# Patient Record
Sex: Female | Born: 1945
Health system: Southern US, Community
[De-identification: ages and names within clinical notes are randomized; demographics above are authoritative.]

## PROBLEM LIST (undated history)

## (undated) DIAGNOSIS — E119 Type 2 diabetes mellitus without complications: Secondary | ICD-10-CM

## (undated) DIAGNOSIS — I10 Essential (primary) hypertension: Secondary | ICD-10-CM

## (undated) DIAGNOSIS — E785 Hyperlipidemia, unspecified: Secondary | ICD-10-CM

## (undated) HISTORY — PX: CHOLECYSTECTOMY: SHX55

---

## 2005-06-13 ENCOUNTER — Ambulatory Visit: Payer: Self-pay | Admitting: Internal Medicine

## 2009-04-01 ENCOUNTER — Ambulatory Visit: Payer: Self-pay | Admitting: Family Medicine

## 2009-04-01 DIAGNOSIS — L851 Acquired keratosis [keratoderma] palmaris et plantaris: Secondary | ICD-10-CM | POA: Insufficient documentation

## 2009-04-02 ENCOUNTER — Encounter: Payer: Self-pay | Admitting: Family Medicine

## 2011-10-25 ENCOUNTER — Ambulatory Visit: Payer: Self-pay | Admitting: Family Medicine

## 2012-09-03 DIAGNOSIS — G4733 Obstructive sleep apnea (adult) (pediatric): Secondary | ICD-10-CM | POA: Insufficient documentation

## 2012-12-29 LAB — HM COLONOSCOPY

## 2013-10-01 LAB — HM MAMMOGRAPHY

## 2014-05-26 LAB — LIPID PANEL
Cholesterol: 175 mg/dL (ref 0–200)
HDL: 40 mg/dL (ref 35–70)
LDL CALC: 79 mg/dL
Triglycerides: 281 mg/dL — AB (ref 40–160)

## 2014-05-26 LAB — MICROALBUMIN / CREATININE URINE RATIO: Microalb Creat Ratio: 16.2

## 2014-05-26 LAB — HEMOGLOBIN A1C: Hgb A1c MFr Bld: 7.5 % — AB (ref 4.0–6.0)

## 2014-09-01 LAB — CBC AND DIFFERENTIAL
Hemoglobin: 12.5 g/dL (ref 12.0–16.0)
Platelets: 281 10*3/uL (ref 150–399)
WBC: 6.5 10^3/mL

## 2014-09-01 LAB — BASIC METABOLIC PANEL
CREATININE: 0.6 mg/dL (ref 0.5–1.1)
Glucose: 135 mg/dL
POTASSIUM: 4.5 mmol/L (ref 3.4–5.3)
Sodium: 139 mmol/L (ref 137–147)

## 2014-09-01 LAB — CALCIUM: Calcium: 9.5 mg/dL

## 2014-09-01 LAB — LIPID PANEL
Cholesterol: 189 mg/dL (ref 0–200)
HDL: 44 mg/dL (ref 35–70)
LDL CALC: 77 mg/dL
Triglycerides: 341 mg/dL — AB (ref 40–160)

## 2014-09-01 LAB — TSH: TSH: 3.36 u[IU]/mL (ref 0.41–5.90)

## 2014-09-01 LAB — HEMOGLOBIN A1C: HEMOGLOBIN A1C: 7.5 % — AB (ref 4.0–6.0)

## 2014-09-01 LAB — HEPATIC FUNCTION PANEL
ALT: 17 U/L (ref 7–35)
AST: 16 U/L (ref 13–35)
Alkaline Phosphatase: 76 U/L (ref 25–125)

## 2014-09-01 LAB — MICROALBUMIN / CREATININE URINE RATIO: Microalb Creat Ratio: 3.36

## 2014-12-15 LAB — HEPATIC FUNCTION PANEL
ALK PHOS: 77 U/L (ref 25–125)
ALT: 19 U/L (ref 7–35)
AST: 17 U/L (ref 13–35)

## 2014-12-15 LAB — HEMOGLOBIN A1C: HEMOGLOBIN A1C: 7.7 % — AB (ref 4.0–6.0)

## 2014-12-15 LAB — BASIC METABOLIC PANEL: CREATININE: 0.5 mg/dL (ref 0.5–1.1)

## 2014-12-15 LAB — LIPID PANEL
CHOLESTEROL: 237 mg/dL — AB (ref 0–200)
HDL: 43 mg/dL (ref 35–70)
LDL CALC: 120 mg/dL
Triglycerides: 372 mg/dL — AB (ref 40–160)

## 2015-03-04 ENCOUNTER — Encounter: Payer: Self-pay | Admitting: *Deleted

## 2015-03-04 ENCOUNTER — Emergency Department
Admission: EM | Admit: 2015-03-04 | Discharge: 2015-03-04 | Disposition: A | Discharge status: Home / Self Care | Payer: Medicare HMO | Source: Home / Self Care | Attending: Emergency Medicine | Admitting: Emergency Medicine

## 2015-03-04 DIAGNOSIS — R2231 Localized swelling, mass and lump, right upper limb: Secondary | ICD-10-CM | POA: Diagnosis not present

## 2015-03-04 HISTORY — DX: Type 2 diabetes mellitus without complications: E11.9

## 2015-03-04 HISTORY — DX: Essential (primary) hypertension: I10

## 2015-03-04 HISTORY — DX: Hyperlipidemia, unspecified: E78.5

## 2015-03-04 NOTE — ED Provider Notes (Signed)
CSN: [id number]     Arrival date & time 03/04/15  1540 History   First MD Initiated Contact with Patient 03/04/15 1555     Chief Complaint  Patient presents with  . Mass   (Consider location/radiation/quality/duration/timing/severity/associated sxs/prior Treatment) HPI She is here to discuss a mass in the back of her right arm.  It is been there over a year.  Her primary care physician has ordered a ultrasound about a year ago and told her not to worry about it.  She thinks it has been getting bigger lately.  Because she is getting ready to go on a cruise and will be out of country, she was concerned.  She is also concerned with possible motion sickness and wonders if there's anything she can do for that.  No fever, chills, night sweats, weight loss.  No drainage from the mass.  It has been tender recently but she thinks she has been pressing on it more lately.  Past Medical History  Diagnosis Date  . Hypertension   . Diabetes mellitus without complication   . Hyperlipidemia    Past Surgical History  Procedure Laterality Date  . Cholecystectomy     Family History  Problem Relation Age of Onset  . Diabetes Father   . Cancer Father    History  Substance Use Topics  . Smoking status: Never Smoker   . Smokeless tobacco: Not on file  . Alcohol Use: No   OB History    No data available     Review of Systems  All other systems reviewed and are negative.   Allergies  Review of patient's allergies indicates no known allergies.  Home Medications   Prior to Admission medications   Medication Sig Start Date End Date Taking? Authorizing Provider  UNKNOWN TO PATIENT    Yes Historical Provider, MD  UNKNOWN TO PATIENT    Yes Historical Provider, MD  UNKNOWN TO PATIENT    Yes Historical Provider, MD   BP 119/67 mmHg  Pulse 70  Temp(Src) 98.5 F (36.9 C) (Oral)  Resp 18  Ht 5' 2.5" (1.588 m)  Wt 210 lb (95.255 kg)  BMI 37.77 kg/m2  SpO2 97% Physical Exam  Constitutional:  She is oriented to person, place, and time. She appears well-developed and well-nourished.  HENT:  Head: Normocephalic and atraumatic.  Eyes: No scleral icterus.  Neck: Neck supple.  Cardiovascular: Regular rhythm and normal heart sounds.   Pulmonary/Chest: Effort normal and breath sounds normal. No respiratory distress.  Neurological: She is alert and oriented to person, place, and time.  Skin: Skin is warm and dry.     She has a mass on the back of her upper arm as shown.  It is approximately 5 cm in diameter, soft, mobile, tender.  No erythema.  Psychiatric: She has a normal mood and affect. Her speech is normal.  Nursing note and vitals reviewed.   ED Course  Procedures (including critical care time) Labs Review Labs Reviewed - No data to display  Imaging Review No results found.   MDM   1. Arm mass, right    For her upcoming cruise, I check to see if her insurance would cover scopolamine patch and it did not.  So I have encouraged her to take Dramamine if necessary. For the mass on her arm, I feel that this is likely a sebaceous cyst versus a lipoma.  I do not see any signs of infection.  Can take over-the-counter anti-inflammatory medicines.  Would  like to repeat ultrasound and have her follow-up with Dr. Darene Lamer.  She states that she is also going to establish with Dr. Madilyn Fireman as her primary care physician.   Janeann Forehand, MD 03/04/15 [phone]

## 2015-03-04 NOTE — ED Notes (Signed)
Pt c/o lump on the back of her RT upper arm x 1 year that has become inflamed and painful recently. She reports having an U/S 1 year ago and was told it was benign.

## 2015-03-09 ENCOUNTER — Encounter: Payer: Medicare HMO | Admitting: Sports Medicine

## 2015-03-11 ENCOUNTER — Other Ambulatory Visit: Payer: Self-pay | Admitting: Emergency Medicine

## 2015-03-11 DIAGNOSIS — M7989 Other specified soft tissue disorders: Secondary | ICD-10-CM

## 2015-03-14 ENCOUNTER — Encounter: Payer: Self-pay | Admitting: Sports Medicine

## 2015-03-14 ENCOUNTER — Ambulatory Visit (INDEPENDENT_AMBULATORY_CARE_PROVIDER_SITE_OTHER): Payer: Medicare HMO | Admitting: Sports Medicine

## 2015-03-14 ENCOUNTER — Ambulatory Visit (INDEPENDENT_AMBULATORY_CARE_PROVIDER_SITE_OTHER): Payer: Medicare HMO

## 2015-03-14 VITALS — BP 153/75 | HR 66 | Ht 62.0 in | Wt 210.0 lb

## 2015-03-14 DIAGNOSIS — M7989 Other specified soft tissue disorders: Secondary | ICD-10-CM

## 2015-03-14 DIAGNOSIS — R2231 Localized swelling, mass and lump, right upper limb: Secondary | ICD-10-CM

## 2015-03-14 DIAGNOSIS — E119 Type 2 diabetes mellitus without complications: Secondary | ICD-10-CM | POA: Diagnosis not present

## 2015-03-14 DIAGNOSIS — M799 Soft tissue disorder, unspecified: Secondary | ICD-10-CM | POA: Diagnosis not present

## 2015-03-14 DIAGNOSIS — R2230 Localized swelling, mass and lump, unspecified upper limb: Secondary | ICD-10-CM | POA: Insufficient documentation

## 2015-03-14 MED ORDER — DIAZEPAM 5 MG PO TABS: ORAL_TABLET | ORAL | Status: DC

## 2015-03-14 NOTE — Assessment & Plan Note (Signed)
MRI with and without IV contrast. Considering the rapid growth, tenderness, even the ultrasound looks like a simple lipoma we are going to do an MRI for further evaluation that this may be a liposarcoma. Return for MRI results. Valium for preprocedural anxiolysis

## 2015-03-14 NOTE — Progress Notes (Signed)
   Subjective:    I'm seeing this patient as a consultation for:  Dr. Koleen Nimrod  CC: Right arm mass  HPI: This is a pleasant 69 year old female, for a long time she's had a mass in her right upper outer arm, an ultrasound prior showed what appeared to be a lipoma, more recently it's been enlarging, she was seen in urgent care where ultrasound showed again what appear to be a lipoma however much larger. It's tender, denies any constitutional symptoms. No trauma. Symptoms are moderate, worsening.  She is going to establish with Dr. Madilyn Fireman, she does have diabetes, we are getting blood work in anticipation of an IV contrast load, I'm happy to obtain blood work for her diabetes is well for Dr. Madilyn Fireman to follow up.  Past medical history, Surgical history, Family history not pertinant except as noted below, Social history, Allergies, and medications have been entered into the medical record, reviewed, and no changes needed.   Review of Systems: No headache, visual changes, nausea, vomiting, diarrhea, constipation, dizziness, abdominal pain, skin rash, fevers, chills, night sweats, weight loss, swollen lymph nodes, body aches, joint swelling, muscle aches, chest pain, shortness of breath, mood changes, visual or auditory hallucinations.   Objective:   General: Well Developed, well nourished, and in no acute distress.  Neuro/Psych: Alert and oriented x3, extra-ocular muscles intact, able to move all 4 extremities, sensation grossly intact. Skin: Warm and dry, no rashes noted.  Respiratory: Not using accessory muscles, speaking in full sentences, trachea midline.  Cardiovascular: Pulses palpable, no extremity edema. Abdomen: Does not appear distended. Right upper arm: There is a 5 cm moderately well-defined mass, tender, in the subcutaneous tissues. There is some overlying telangiectasias. No axillary adenopathy was palpable.  Impression and Recommendations:   This case required medical decision  making of moderate complexity.

## 2015-03-14 NOTE — Assessment & Plan Note (Signed)
Patient is going to establish with Dr. Madilyn Fireman, I am going to tee her up with blood work, they're also going to discuss weight loss.

## 2015-03-24 LAB — CBC
HCT: 37.4 % (ref 36.0–46.0)
Hemoglobin: 12.5 g/dL (ref 12.0–15.0)
MCH: 28.1 pg (ref 26.0–34.0)
MCHC: 33.4 g/dL (ref 30.0–36.0)
MCV: 84 fL (ref 78.0–100.0)
MPV: 10.5 fL (ref 8.6–12.4)
Platelets: 269 10*3/uL (ref 150–400)
RBC: 4.45 MIL/uL (ref 3.87–5.11)
RDW: 15.2 % (ref 11.5–15.5)
WBC: 7.1 K/uL (ref 4.0–10.5)

## 2015-03-25 LAB — URINALYSIS
Bilirubin Urine: NEGATIVE
Glucose, UA: NEGATIVE mg/dL
Hgb urine dipstick: NEGATIVE
Ketones, ur: NEGATIVE mg/dL
Leukocytes, UA: NEGATIVE
Nitrite: NEGATIVE
Protein, ur: NEGATIVE mg/dL
Specific Gravity, Urine: 1.026 (ref 1.005–1.030)
Urobilinogen, UA: 0.2 mg/dL (ref 0.0–1.0)
pH: 5 (ref 5.0–8.0)

## 2015-03-25 LAB — LIPID PANEL
Cholesterol: 172 mg/dL (ref 0–200)
HDL: 43 mg/dL — ABNORMAL LOW (ref >=46)
LDL Cholesterol: 80 mg/dL (ref 0–99)
Total CHOL/HDL Ratio: 4 Ratio
Triglycerides: 245 mg/dL — ABNORMAL HIGH (ref <150)
VLDL: 49 mg/dL — ABNORMAL HIGH (ref 0–40)

## 2015-03-25 LAB — MICROALBUMIN / CREATININE URINE RATIO
Creatinine, Urine: 218.3 mg/dL
Microalb Creat Ratio: 8.2 mg/g (ref 0.0–30.0)
Microalb, Ur: 1.8 mg/dL (ref <2.0)

## 2015-03-25 LAB — COMPREHENSIVE METABOLIC PANEL
Albumin: 3.8 g/dL (ref 3.5–5.2)
Alkaline Phosphatase: 61 U/L (ref 39–117)
BUN: 19 mg/dL (ref 6–23)
CO2: 24 mEq/L (ref 19–32)
Creat: 0.6 mg/dL (ref 0.50–1.10)
Glucose, Bld: 125 mg/dL — ABNORMAL HIGH (ref 70–99)
Total Bilirubin: 0.6 mg/dL (ref 0.2–1.2)

## 2015-03-25 LAB — TSH: TSH: 3.147 u[IU]/mL (ref 0.350–4.500)

## 2015-03-25 LAB — COMPREHENSIVE METABOLIC PANEL WITH GFR
ALT: 15 U/L (ref 0–35)
AST: 13 U/L (ref 0–37)
Calcium: 9.2 mg/dL (ref 8.4–10.5)
Chloride: 105 meq/L (ref 96–112)
Potassium: 4.2 meq/L (ref 3.5–5.3)
Sodium: 139 meq/L (ref 135–145)
Total Protein: 6.2 g/dL (ref 6.0–8.3)

## 2015-03-25 LAB — HEMOGLOBIN A1C
Hgb A1c MFr Bld: 6.9 % — ABNORMAL HIGH (ref <5.7)
Mean Plasma Glucose: 151 mg/dL — ABNORMAL HIGH (ref <117)

## 2015-03-25 LAB — VITAMIN D 25 HYDROXY (VIT D DEFICIENCY, FRACTURES): Vit D, 25-Hydroxy: 26 ng/mL — ABNORMAL LOW (ref 30–100)

## 2015-03-25 MED ORDER — VITAMIN D (ERGOCALCIFEROL) 1.25 MG (50000 UNIT) PO CAPS: 50000.0000 [IU] | ORAL_CAPSULE | ORAL | Status: DC

## 2015-03-25 NOTE — Addendum Note (Signed)
Addended by: Silverio Decamp on: 03/25/2015 01:15 PM   Modules accepted: Orders

## 2015-03-28 ENCOUNTER — Ambulatory Visit (INDEPENDENT_AMBULATORY_CARE_PROVIDER_SITE_OTHER): Payer: Medicare HMO | Admitting: Family Medicine

## 2015-03-28 ENCOUNTER — Encounter: Payer: Self-pay | Admitting: Family Medicine

## 2015-03-28 VITALS — BP 138/80 | HR 71 | Ht 62.5 in | Wt 210.0 lb

## 2015-03-28 DIAGNOSIS — R635 Abnormal weight gain: Secondary | ICD-10-CM | POA: Diagnosis not present

## 2015-03-28 DIAGNOSIS — E119 Type 2 diabetes mellitus without complications: Secondary | ICD-10-CM

## 2015-03-28 DIAGNOSIS — I10 Essential (primary) hypertension: Secondary | ICD-10-CM | POA: Diagnosis not present

## 2015-03-28 DIAGNOSIS — Z6838 Body mass index (BMI) 38.0-38.9, adult: Secondary | ICD-10-CM

## 2015-03-28 DIAGNOSIS — E785 Hyperlipidemia, unspecified: Secondary | ICD-10-CM | POA: Insufficient documentation

## 2015-03-28 DIAGNOSIS — G2581 Restless legs syndrome: Secondary | ICD-10-CM | POA: Insufficient documentation

## 2015-03-28 DIAGNOSIS — G4700 Insomnia, unspecified: Secondary | ICD-10-CM | POA: Insufficient documentation

## 2015-03-28 MED ORDER — CANAGLIFLOZIN 100 MG PO TABS: 100.0000 mg | ORAL_TABLET | Freq: Every day | ORAL | Status: DC

## 2015-03-28 NOTE — Progress Notes (Signed)
Subjective:    Patient ID: Jillian Huynh, female    DOB: [birth year], 69 y.o.   MRN: [id number]  HPI 69 year old female who is here today to establish care. Her husband's are at an established patient at our practice.  Diabetes - her last A1c was 6.9. no hypoglycemic events. No wounds or sores that are not healing well. No increased thirst or urination. Checking glucose at home. Taking medications as prescribed without any side effects. Has gained 6 lbs since started the Actos.  Hypertension- Pt denies chest pain, SOB, dizziness, or heart palpitations.  Taking meds as directed w/o problems.  Denies medication side effects.     Review of Systems  Constitutional: Negative for fever, diaphoresis and unexpected weight change.  HENT: Negative for hearing loss, rhinorrhea and tinnitus.   Eyes: Negative for visual disturbance.  Respiratory: Negative for cough and wheezing.   Cardiovascular: Negative for chest pain and palpitations.  Gastrointestinal: Negative for nausea, vomiting, diarrhea and blood in stool.  Genitourinary: Negative for vaginal bleeding, vaginal discharge and difficulty urinating.  Musculoskeletal: Negative for myalgias and arthralgias.  Skin: Negative for rash.  Neurological: Negative for headaches.  Hematological: Negative for adenopathy. Does not bruise/bleed easily.  Psychiatric/Behavioral: Negative for sleep disturbance and dysphoric mood. The patient is not nervous/anxious.      BP 138/80 mmHg  Pulse 71  Ht 5' 2.5" (1.588 m)  Wt 210 lb (95.255 kg)  BMI 37.77 kg/m2  SpO2 95%    Allergies  Allergen Reactions  . Metformin And Related Nausea Only, Diarrhea and Nausea And Vomiting    Past Medical History  Diagnosis Date  . Hypertension   . Diabetes mellitus without complication   . Hyperlipidemia     Past Surgical History  Procedure Laterality Date  . Cholecystectomy      History   Social History  . Marital Status: Married    Spouse Name: N/A  .  Number of Children: N/A  . Years of Education: N/A   Occupational History  . Not on file.   Social History Main Topics  . Smoking status: Never Smoker   . Smokeless tobacco: Not on file  . Alcohol Use: No  . Drug Use: No  . Sexual Activity: Not on file   Other Topics Concern  . Not on file   Social History Narrative    Family History  Problem Relation Age of Onset  . Diabetes Father   . Cancer Father     Outpatient Encounter Prescriptions as of 03/28/2015  Medication Sig  . ACCU-CHEK AVIVA PLUS test strip   . atorvastatin (LIPITOR) 10 MG tablet TAKE 1 TABLET (10 MG TOTAL) BY MOUTH DAILY.  Marland Kitchen Blood Glucose Monitoring Suppl DEVI Check blood glucose daily and prn dx 250.00  . Cholecalciferol (VITAMIN D3) 400 UNITS CAPS Take by mouth.  . Cyanocobalamin (VITAMIN B 12) 100 MCG LOZG Take 3 tablets by mouth.  . docusate calcium (SURFAK) 240 MG capsule Take 3 capsules by mouth.  . estradiol (ESTRACE) 0.5 MG tablet   . lisinopril (PRINIVIL,ZESTRIL) 10 MG tablet Take 10 mg by mouth.  . medroxyPROGESTERone (PROVERA) 2.5 MG tablet Take 2.5 mg by mouth.  . Multiple Vitamin (MULTIVITAMIN) capsule Take 1 capsule by mouth.  . Probiotic Product (ACIDOPHILUS/GOAT MILK) CAPS Take 1 tablet by mouth.  . Vitamin D, Ergocalciferol, (DRISDOL) 50000 UNITS CAPS capsule Take 1 capsule (50,000 Units total) by mouth every 7 (seven) days.  . [DISCONTINUED] diazepam (VALIUM) 5 MG tablet Take 1 tab  PO 1 hour before procedure or imaging.  . [DISCONTINUED] pioglitazone (ACTOS) 30 MG tablet Take 30 mg by mouth.  . canagliflozin (INVOKANA) 100 MG TABS tablet Take 1 tablet (100 mg total) by mouth daily.   No facility-administered encounter medications on file as of 03/28/2015.          Objective:   Physical Exam  Constitutional: She is oriented to person, place, and time. She appears well-developed and well-nourished.  HENT:  Head: Normocephalic and atraumatic.  Cardiovascular: Normal rate, regular  rhythm and normal heart sounds.   Pulmonary/Chest: Effort normal and breath sounds normal.  Neurological: She is alert and oriented to person, place, and time.  Skin: Skin is warm and dry.  Psychiatric: She has a normal mood and affect. Her behavior is normal.          Assessment & Plan:  DM-  On statin and ACE. A1c up to 6.9.  She reports that this is better than it was. She really wants to change the actos since has noticed sig weight gain on the medication. Discussed options. Will try changing to [facility]. Given Rx and coupon card for 30 day free trial. Covered on insurance plan.  F/U in 3 mo.    HTN - repeat BP at goal. F/U in 3 mo.   Abnormal weigh gain/BMI 38 - Discussed using My Fitness Pal and regular exercise.  Will change med as well. F/U in 3 mo or sooner if really struggling with weight loss.

## 2015-03-28 NOTE — Patient Instructions (Signed)
Work on exercise for 30 min 5 days per week.  Smart Phone free appl - My Fitness Pal

## 2015-03-29 ENCOUNTER — Encounter: Payer: Self-pay | Admitting: Family Medicine

## 2015-04-02 ENCOUNTER — Ambulatory Visit (HOSPITAL_BASED_OUTPATIENT_CLINIC_OR_DEPARTMENT_OTHER)
Admission: RE | Admit: 2015-04-02 | Discharge: 2015-04-02 | Disposition: A | Discharge status: Home / Self Care | Payer: Medicare HMO | Source: Ambulatory Visit | Attending: Sports Medicine | Admitting: Sports Medicine

## 2015-04-02 DIAGNOSIS — R2231 Localized swelling, mass and lump, right upper limb: Secondary | ICD-10-CM | POA: Insufficient documentation

## 2015-04-02 MED ORDER — GADOBENATE DIMEGLUMINE 529 MG/ML IV SOLN
19.0000 mL | Freq: Once | INTRAVENOUS | Status: AC | PRN
  Administered 2015-04-02: 19 mL via INTRAVENOUS

## 2015-04-06 ENCOUNTER — Encounter: Payer: Self-pay | Admitting: Family Medicine

## 2015-04-14 ENCOUNTER — Encounter: Payer: Self-pay | Admitting: Family Medicine

## 2015-06-01 ENCOUNTER — Telehealth: Payer: Self-pay | Admitting: *Deleted

## 2015-06-01 MED ORDER — FLUCONAZOLE 150 MG PO TABS: 150.0000 mg | ORAL_TABLET | Freq: Once | ORAL | Status: DC

## 2015-06-01 MED ORDER — DAPAGLIFLOZIN PROPANEDIOL 10 MG PO TABS: 10.0000 mg | ORAL_TABLET | Freq: Every day | ORAL | Status: DC

## 2015-06-01 NOTE — Telephone Encounter (Signed)
Pt stated that the sample med that she was given caused her to get a yeast infection. She took this for 30 days. After she finished that she went back to the "old" diabetic pioglitazone and she feels bloated and has gained weight and feels miserable. She doesn't want to take something that will cause her to have a yeast infection. She has a f/u appt on 06/29/15. She would like to know what should be done. I advised her that if Dr. Madilyn Fireman were to send something into her pharmacy she would not send it to her mail order due to not knowing if this would be a good fit for her and that if there were to be any changes it would be easier to just send it to her local pharmacy. Pt voiced understanding. Will fwd to pcp for f/u.Audelia Hives [place]

## 2015-06-01 NOTE — Telephone Encounter (Signed)
Let try [place]. Still has risk of yeast infection but I think she will tolerate it better. Will also send over diflucan so just in case gets another yeat infection can take pill and knock it out quickly.

## 2015-06-01 NOTE — Telephone Encounter (Signed)
Pt called and lvm stating that she was seen about 1.5 months ago and was given a new DM med. This medication caused her to have a yeast infection that lasted for about 10 days. She finished the medication and went back to her old medication. She said that she has gained about 6 lbs and has been bloated. Jillian Huynh, [name]

## 2015-06-01 NOTE — Telephone Encounter (Signed)
Called and lvm informing pt of med change to [place] and Diflucan for yeast. Advised to rtn call if any questions.Jillian Huynh [place]

## 2015-06-03 MED ORDER — EMPAGLIFLOZIN 10 MG PO TABS: 10.0000 mg | ORAL_TABLET | Freq: Every day | ORAL | Status: DC

## 2015-06-03 NOTE — Addendum Note (Signed)
Addended by: Beatrice Lecher D on: 06/03/2015 12:49 PM   Modules accepted: Orders

## 2015-06-03 NOTE — Telephone Encounter (Addendum)
Called pt to inform her that her insurance will not cover [place] and since she had an intolerance to Invokana we will send the Jardiance to [facility]. Pt voiced understanding. Placed order for med and pended for approval.Senay Sistrunk, [name]'

## 2015-06-03 NOTE — Addendum Note (Signed)
Addended by: Teddy Spike on: 06/03/2015 12:31 PM   Modules accepted: Orders, Medications

## 2015-06-07 ENCOUNTER — Telehealth: Payer: Self-pay | Admitting: Family Medicine

## 2015-06-07 NOTE — Telephone Encounter (Signed)
Received fax for prior authorization on Farxiga sent through cover my meds waiting on authorization. - CF

## 2015-06-08 NOTE — Telephone Encounter (Signed)
Received fax from [facility] and they have approved Farxiga 10mg  tablets until 11/12/2015. - CF

## 2015-06-10 ENCOUNTER — Encounter: Payer: Self-pay | Admitting: *Deleted

## 2015-06-29 ENCOUNTER — Encounter: Payer: Self-pay | Admitting: Family Medicine

## 2015-06-29 ENCOUNTER — Ambulatory Visit (INDEPENDENT_AMBULATORY_CARE_PROVIDER_SITE_OTHER): Payer: Medicare HMO | Admitting: Family Medicine

## 2015-06-29 VITALS — BP 140/78 | HR 73 | Ht 62.5 in | Wt 210.0 lb

## 2015-06-29 DIAGNOSIS — E119 Type 2 diabetes mellitus without complications: Secondary | ICD-10-CM | POA: Diagnosis not present

## 2015-06-29 DIAGNOSIS — R635 Abnormal weight gain: Secondary | ICD-10-CM

## 2015-06-29 DIAGNOSIS — N760 Acute vaginitis: Secondary | ICD-10-CM | POA: Diagnosis not present

## 2015-06-29 DIAGNOSIS — Z23 Encounter for immunization: Secondary | ICD-10-CM

## 2015-06-29 LAB — POCT GLYCOSYLATED HEMOGLOBIN (HGB A1C): Hemoglobin A1C: 6.6

## 2015-06-29 MED ORDER — DAPAGLIFLOZIN PROPANEDIOL 10 MG PO TABS: 10.0000 mg | ORAL_TABLET | Freq: Every day | ORAL | Status: DC

## 2015-06-29 NOTE — Patient Instructions (Signed)
Recommend use My Fitness Pal.

## 2015-06-29 NOTE — Progress Notes (Signed)
Subjective:    Patient ID: Jillian Huynh, female    DOB: 11-12-1946, 69 y.o.   MRN: [id number]  HPI Diabetes - no hypoglycemic events. No wounds or sores that are not healing well. No increased thirst or urination. Checking glucose at home. Taking medications as prescribed without any side effects. Wilder Glade was just approved about 2 weeks ago.   She said she picked up one of the medications and has been taking it but it's difficult to say if it's the Jardiance or infarct C go. The first cecal was initially denied but then a week later was approved by her insurance and in the interim we have sent a prescription for Jardiance.  Abnormal weight gain-  At previous office visit 3 months ago we had discussed using my fitness pal to help her count calories and working on regular exercise in addition to switching her Actos to a new her diabetic medication that does not tend to cause weight gain.  She is having vaginal itching. No pain with urination.  Had this before when she was on the [place]. Didn't treat and it got better after 3 weeks.  2nd time occurred she took the Diflucan we had called in for her and got better. She i sfeeling better now.  She did not actually come in for a testosterone appointment for this.   Review of Systems     Objective:   Physical Exam  Constitutional: She is oriented to person, place, and time. She appears well-developed and well-nourished.  HENT:  Head: Normocephalic and atraumatic.  Cardiovascular: Normal rate, regular rhythm and normal heart sounds.   Pulmonary/Chest: Effort normal and breath sounds normal.  Neurological: She is alert and oriented to person, place, and time.  Skin: Skin is warm and dry.  Psychiatric: She has a normal mood and affect. Her behavior is normal.          Assessment & Plan:  DM-  I suspect she is probably taking the falx he doesn't fit was approved by her insurance. They did ask her to double check when she got home and let us  know if it's different than what we think she is taking. She would like a 90 day prescription sent to her mail order pharmacy.   Hemoglobin A1c is 6.6 today which is improved from last time.  Follow-up in 3 months. Continue work on diet and exercise.  Abnormal weight gain/BMI 37 - discussed My Fitness pal.  She said she lost the paper that I gave her last time but says that she will plan to do so. I think will help her track calories. Discussed the importance of eating small amounts route daily with plenty of vegetables and making sure she's getting adequate protein. 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 She said she used to walk daily years ago but after her daughter passed away she quit exercising. Encouraged her to walk at least on her days off from work. Even starting with 15 or 20 minutes on those days as a great start. Just encouraged her to take baby steps and trying to get herself healthy again.   vaginitis-her symptoms are resolved now so we did not do a wet prep. Certainly could be secondary to the new medication. Encouraged her to make sure that she's hydrating well and to call us and come in if she gets recurrent symptoms so  that we can test with a wet prep to verify if she has a yeast infection or  Urinary tract infection.

## 2015-07-21 ENCOUNTER — Telehealth: Payer: Self-pay | Admitting: *Deleted

## 2015-07-21 NOTE — Telephone Encounter (Signed)
Pt reports that the pharmacist told her to try Glipizide. Will fwd to pcp .Audelia Hives [place]

## 2015-07-21 NOTE — Telephone Encounter (Signed)
Pt called and stating that the medication that Dr. Madilyn Fireman started her on is to expensive and she will need an alternative. I called her back and asked that she return the call to let us know what were the generics that she was given to take instead. [name], [name]

## 2015-07-22 MED ORDER — GLIPIZIDE 5 MG PO TABS: 5.0000 mg | ORAL_TABLET | Freq: Two times a day (BID) | ORAL | Status: DC

## 2015-07-22 NOTE — Telephone Encounter (Signed)
Ok will send over to [employer].

## 2015-09-28 ENCOUNTER — Ambulatory Visit (INDEPENDENT_AMBULATORY_CARE_PROVIDER_SITE_OTHER): Payer: Medicare HMO | Admitting: Family Medicine

## 2015-09-28 ENCOUNTER — Encounter: Payer: Self-pay | Admitting: Family Medicine

## 2015-09-28 VITALS — BP 153/61 | HR 75 | Ht 63.0 in | Wt 212.4 lb

## 2015-09-28 DIAGNOSIS — Z79890 Hormone replacement therapy (postmenopausal): Secondary | ICD-10-CM

## 2015-09-28 DIAGNOSIS — K5901 Slow transit constipation: Secondary | ICD-10-CM | POA: Diagnosis not present

## 2015-09-28 DIAGNOSIS — E119 Type 2 diabetes mellitus without complications: Secondary | ICD-10-CM

## 2015-09-28 DIAGNOSIS — R600 Localized edema: Secondary | ICD-10-CM

## 2015-09-28 DIAGNOSIS — R609 Edema, unspecified: Secondary | ICD-10-CM

## 2015-09-28 DIAGNOSIS — R635 Abnormal weight gain: Secondary | ICD-10-CM | POA: Diagnosis not present

## 2015-09-28 DIAGNOSIS — Z6837 Body mass index (BMI) 37.0-37.9, adult: Secondary | ICD-10-CM

## 2015-09-28 LAB — COMPLETE METABOLIC PANEL WITH GFR
ALT: 28 U/L (ref 6–29)
AST: 23 U/L (ref 10–35)
Albumin: 4.3 g/dL (ref 3.6–5.1)
Alkaline Phosphatase: 87 U/L (ref 33–130)
BILIRUBIN TOTAL: 0.7 mg/dL (ref 0.2–1.2)
BUN: 12 mg/dL (ref 7–25)
CHLORIDE: 104 mmol/L (ref 98–110)
CO2: 25 mmol/L (ref 20–31)
CREATININE: 0.56 mg/dL (ref 0.50–0.99)
Calcium: 9.9 mg/dL (ref 8.6–10.4)
GFR, Est Non African American: >89 mL/min (ref >=60)
GLUCOSE: 113 mg/dL — AB (ref 65–99)
Potassium: 4.3 mmol/L (ref 3.5–5.3)
SODIUM: 139 mmol/L (ref 135–146)
TOTAL PROTEIN: 6.8 g/dL (ref 6.1–8.1)

## 2015-09-28 LAB — POCT GLYCOSYLATED HEMOGLOBIN (HGB A1C): HEMOGLOBIN A1C: 6.5

## 2015-09-28 LAB — TSH: TSH: 2.672 u[IU]/mL (ref 0.350–4.500)

## 2015-09-28 MED ORDER — LISINOPRIL-HYDROCHLOROTHIAZIDE 10-12.5 MG PO TABS: 1.0000 | ORAL_TABLET | Freq: Every day | ORAL | Status: DC

## 2015-09-28 NOTE — Progress Notes (Signed)
Subjective:    Patient ID: Jillian Huynh, female    DOB: 09/17/1946, 69 y.o.   MRN: [id number]  HPI Diabetes - no hypoglycemic events. No wounds or sores that are not healing well. No increased thirst or urination. Checking glucose at home. Taking medications as prescribed without any side effects.  abnormal weight gain - she is very active and she has been trying to exercise.  She is also on glipizide.  She is just ver frustrated because she is gaining weight. She says she is still retaining a lot of fluid.  I had ntice the last time she was here that her feet and hands look swollen. She says she really tries to avoid any excess salt at all. Stays away from soft drinks etc. She said her biggest weaknesses sugary fods. She said a lot of people bring and snacks and treats to work and it's just extremely tempting.  She has come off her hormones. She knows she neede to get off of them so has stopped them. She has started  Having hot flashes again but says she is dealing with it.  Constipation - she is taking Plexia and says it has helped her bowels. She has been taking a probiotic with it as well.  She is now having daily BMs.    Lab Results  Component Value Date   TSH 3.147 03/24/2015    Review of Systems     Objective:   Physical Exam  Constitutional: She is oriented to person, place, and time. She appears well-developed and well-nourished.  HENT:  Head: Normocephalic and atraumatic.  Right Ear: External ear normal.  Left Ear: External ear normal.  Nose: Nose normal.  Mouth/Throat: Oropharynx is clear and moist.  TMs and canals are clear.   Eyes: Conjunctivae and EOM are normal. Pupils are equal, round, and reactive to light.  Neck: Neck supple. No thyromegaly present.  Cardiovascular: Normal rate, regular rhythm and normal heart sounds.   Pulmonary/Chest: Effort normal and breath sounds normal. She has no wheezes.  Lymphadenopathy:    She has no cervical adenopathy.   Neurological: She is alert and oriented to person, place, and time.  Skin: Skin is warm and dry.  Psychiatric: She has a normal mood and affect.        Assessment & Plan:  DM - well controlled. Hemoglobin A1c 6.5 today which looks fantastic. She's back on the glipizide. She reports that her eye exam is up-to-date. We'll call to get report.  Abnormal weight gain - will refer to nutritionist.  I do understand that she is frustrated. I discussed with her that I really want her to get in contact with her insurance company to see what medications are covered with her diabetes. It sounds like she is Re: Had her Medicare gap's we may not be able to use them util January but at least we would have an idea of what is covered. I really think theglipizie could be contributing to her recent weight gain.   Extremity edema-recommend a trial of low-dose hydrochlorothiazide which we can add to her diuretic which will also in turn elp control her blood pressure little bit better. Follow-up in 3 months. We can adjust her dose at that time if needed. Again continue to watch salt intake which it sounds like she's doing well with.   Hormonal placement therapy-she i now of of her estrogen and progesterone. Remove from hr meication list.    cconstippation-much improved on her current probiotic regimen.  Goes to [facility].  Will call to get report.   [facility].  Will call to get report. Marland Kitchen

## 2015-12-28 ENCOUNTER — Ambulatory Visit (INDEPENDENT_AMBULATORY_CARE_PROVIDER_SITE_OTHER): Payer: Medicare HMO | Admitting: Family Medicine

## 2015-12-28 ENCOUNTER — Encounter: Payer: Self-pay | Admitting: Family Medicine

## 2015-12-28 VITALS — BP 140/60 | HR 69 | Ht 63.0 in | Wt 214.0 lb

## 2015-12-28 DIAGNOSIS — Z6837 Body mass index (BMI) 37.0-37.9, adult: Secondary | ICD-10-CM

## 2015-12-28 DIAGNOSIS — E119 Type 2 diabetes mellitus without complications: Secondary | ICD-10-CM | POA: Diagnosis not present

## 2015-12-28 DIAGNOSIS — E785 Hyperlipidemia, unspecified: Secondary | ICD-10-CM | POA: Diagnosis not present

## 2015-12-28 DIAGNOSIS — I10 Essential (primary) hypertension: Secondary | ICD-10-CM | POA: Diagnosis not present

## 2015-12-28 DIAGNOSIS — R600 Localized edema: Secondary | ICD-10-CM

## 2015-12-28 DIAGNOSIS — E669 Obesity, unspecified: Secondary | ICD-10-CM

## 2015-12-28 LAB — POCT GLYCOSYLATED HEMOGLOBIN (HGB A1C): HEMOGLOBIN A1C: 7.2

## 2015-12-28 MED ORDER — METFORMIN HCL ER 500 MG PO TB24: 500.0000 mg | ORAL_TABLET | Freq: Every day | ORAL | Status: DC

## 2015-12-28 MED ORDER — LOSARTAN POTASSIUM-HCTZ 50-12.5 MG PO TABS: 1.0000 | ORAL_TABLET | Freq: Every day | ORAL | Status: DC

## 2015-12-28 NOTE — Progress Notes (Signed)
   Subjective:    Patient ID: Jillian Huynh, female    DOB: [birth year], 70 y.o.   MRN: [id number]  HPI Diabetes - no hypoglycemic events. No wounds or sores that are not healing well. No increased thirst or urination. Checking glucose at home. Taking medications as prescribed without any side effects.  LE edema - we added hctz to her regimen at last OV.  Ok Edwards says it has really helped her swelling and hre BP is down.   Hypertension- Pt denies chest pain, SOB, dizziness, or heart palpitations.  Taking meds as directed w/o problems.  She has had a dry cough is been going on for months. She feels like the cough is mostly coming from her throat. She's been in fact she barely noticed it herself but her son who came to visit her head mentioned it to her couple of times that she was coughing frequently. She denies any shortness of breath wheezing or upper respiratory symptoms. No fevers chills or sweats.   She got a fit Bit and has been more intune to it.  She says she really didn't want to see a nutritionist. Unfortunately she has still gained a couple pounds since I last saw her in that she's been trying to exercise more.   Review of Systems     Objective:   Physical Exam  Constitutional: She is oriented to person, place, and time. She appears well-developed and well-nourished.  HENT:  Head: Normocephalic and atraumatic.  Cardiovascular: Normal rate, regular rhythm and normal heart sounds.   Pulmonary/Chest: Effort normal and breath sounds normal.  Neurological: She is alert and oriented to person, place, and time.  Skin: Skin is warm and dry.  Psychiatric: She has a normal mood and affect. Her behavior is normal.          Assessment & Plan:  DM- a1c of 7.2.  She plans on going back for eye exam at [facility].  She is willing to try metformin again.  I discussed to help manage her weight and would really like to get her off of the glipizide. Will start with extended release  metformin which is less likely to cause diarrhea and GI upset. New perception sent to the pharmacy. Okay to go ahead and decrease glipizide down to 1 tab until then. I'll see her back in 3 months.  HTN - lisinopril is likely causing dry cough. Will change to losartan.  Addition of the HCTZ has been great.  Explained that the cough may take a couple weeks to completely resolve but if it is still present after 2-3 weeks and please let me know and we can workup further with chest x-ray etc.  Dry cough - likely medication side effect.  Will chane to losartan.   LE edema - inmproved on diuretiec.  Will continue. In addition the blood pressure is down by about 11 points today which is fantastic. We'll continue to work on diet and exercise to better control blood pressure and swelling.  Abnormal weight gain/obesity/BMI 37-we'll try switching to metformin and hopefully weaning her off of glipizide. I think this would make a big difference in addition to her using her fit bit to help her increase her exercise and activity level. Again continue to work on healthy diet and healthy food choices.

## 2016-03-02 ENCOUNTER — Other Ambulatory Visit: Payer: Self-pay | Admitting: Family Medicine

## 2016-03-08 ENCOUNTER — Encounter: Payer: Self-pay | Admitting: Family Medicine

## 2016-03-13 LAB — HM DIABETES EYE EXAM

## 2016-03-16 ENCOUNTER — Encounter: Payer: Self-pay | Admitting: Family Medicine

## 2016-03-16 ENCOUNTER — Ambulatory Visit (INDEPENDENT_AMBULATORY_CARE_PROVIDER_SITE_OTHER): Payer: Medicare HMO | Admitting: Family Medicine

## 2016-03-16 VITALS — BP 128/65 | HR 72 | Wt 200.0 lb

## 2016-03-16 DIAGNOSIS — Z6835 Body mass index (BMI) 35.0-35.9, adult: Secondary | ICD-10-CM

## 2016-03-16 DIAGNOSIS — E119 Type 2 diabetes mellitus without complications: Secondary | ICD-10-CM | POA: Diagnosis not present

## 2016-03-16 DIAGNOSIS — G2581 Restless legs syndrome: Secondary | ICD-10-CM

## 2016-03-16 LAB — POCT GLYCOSYLATED HEMOGLOBIN (HGB A1C): HEMOGLOBIN A1C: 7

## 2016-03-16 MED ORDER — ATORVASTATIN CALCIUM 10 MG PO TABS: 10.0000 mg | ORAL_TABLET | Freq: Every day | ORAL | Status: DC

## 2016-03-16 MED ORDER — ROPINIROLE HCL 3 MG PO TABS: 3.0000 mg | ORAL_TABLET | Freq: Every day | ORAL | Status: DC

## 2016-03-16 MED ORDER — METFORMIN HCL ER 500 MG PO TB24: 1000.0000 mg | ORAL_TABLET | Freq: Every day | ORAL | Status: DC

## 2016-03-16 NOTE — Progress Notes (Signed)
Subjective:    CC:   HPI: Diabetes - no hypoglycemic events. No wounds or sores that are not healing well. No increased thirst or urination. Checking glucose at home. Taking medications as prescribed without any side effects.   Obesity - He is going over to the wellness center and has recently started phentermine and has lost 14 pounds so far.  Restless leg syndrome-she's currently on Requip 3 mg daily. We have not refilled this for her previously but she is due for refill.  Past medical history, Surgical history, Family history not pertinant except as noted below, Social history, Allergies, and medications have been entered into the medical record, reviewed, and no changes needed.   Review of Systems: No fevers, chills, night sweats, weight loss, chest pain, or shortness of breath.   Objective:    General: Well Developed, well nourished, and in no acute distress.  Neuro: Alert and oriented x3, extra-ocular muscles intact, sensation grossly intact.  HEENT: Normocephalic, atraumatic  Skin: Warm and dry, no rashes. Cardiac: Regular rate and rhythm, no murmurs rubs or gallops, no lower extremity edema.  Respiratory: Clear to auscultation bilaterally. Not using accessory muscles, speaking in full sentences.   Impression and Recommendations:   DM- almost at goal.  She is doing well with the extender release metformin so far. She didn't tolerate the regular release because of GI upset. So we discussed increasing the metformin to 2 tabs daily and see if she does okay with this over the next 3 months. Certainly if she doesn't tolerate it I encouraged her to call the office back and let me know. Still eventually like to get her off the glipizide of possible in the future because of the weight gain issue. She did Artie tried [place] and didn't do well with it.F/U in 3 mo.  Due for labs.   Lab Results  Component Value Date   HGBA1C 7.0 03/16/2016    Obesity/BMI 35-she's actually done fantastic  with phentermine and is Artie lost 14 pounds. She's followed at the wellness Center. She doesn't quite have a regular exercise routine and that she is starting to do some daily step counting. Just rolling encouraged her to make sure that she gets on track with this because at some point she will have to try to maintain her weight when she comes off the phentermine.  Restless leg syndrome-well-controlled on her quit. New perception sent to mail order.

## 2016-03-26 ENCOUNTER — Ambulatory Visit: Payer: Medicare HMO | Admitting: Family Medicine

## 2016-06-07 ENCOUNTER — Other Ambulatory Visit: Payer: Self-pay | Admitting: Family Medicine

## 2016-06-14 ENCOUNTER — Other Ambulatory Visit: Payer: Self-pay | Admitting: Family Medicine

## 2016-06-20 ENCOUNTER — Encounter: Payer: Self-pay | Admitting: Family Medicine

## 2016-06-20 ENCOUNTER — Ambulatory Visit (INDEPENDENT_AMBULATORY_CARE_PROVIDER_SITE_OTHER): Payer: Medicare HMO | Admitting: Family Medicine

## 2016-06-20 VITALS — BP 128/78 | HR 80 | Resp 15 | Wt 195.0 lb

## 2016-06-20 DIAGNOSIS — I10 Essential (primary) hypertension: Secondary | ICD-10-CM

## 2016-06-20 DIAGNOSIS — E119 Type 2 diabetes mellitus without complications: Secondary | ICD-10-CM | POA: Diagnosis not present

## 2016-06-20 DIAGNOSIS — D1720 Benign lipomatous neoplasm of skin and subcutaneous tissue of unspecified limb: Secondary | ICD-10-CM | POA: Diagnosis not present

## 2016-06-20 LAB — POCT GLYCOSYLATED HEMOGLOBIN (HGB A1C): Hemoglobin A1C: 7

## 2016-06-20 NOTE — Progress Notes (Signed)
Subjective:    CC: DM  HPI: Diabetes - no hypoglycemic events. No wounds or sores that are not healing well. No increased thirst or urination. Checking glucose at home. Taking medications as prescribed without any side effects.  Hypertension- Pt denies chest pain, SOB, dizziness, or heart palpitations.  Taking meds as directed w/o problems.  Denies medication side effects.    She did have her eye exam with Dr. Chapman Moss here in [place] in [place]. They did do some further examination for glaucoma and she will now. She has a very strong family history in 2 sisters, her mother and her maternal aunt.  She also has a lipoma on her right upper arm. It was confirmed with diagnosis with Dr. Dianah Field. He had offered surgery but she went discuss it with me first to see what I thought about it.   BP 128/78 (BP Location: Right Arm, Cuff Size: Normal)   Pulse 80   Resp 15   Wt 195 lb (88.5 kg)   SpO2 99%   BMI 34.54 kg/m     Allergies  Allergen Reactions  . Metformin And Related Nausea Only, Diarrhea and Nausea And Vomiting  . Invokana [Canagliflozin] Other (See Comments)    Yeast infection    Past Medical History:  Diagnosis Date  . Diabetes mellitus without complication ([place])   . Hyperlipidemia   . Hypertension     Past Surgical History:  Procedure Laterality Date  . CHOLECYSTECTOMY      Social History   Social History  . Marital status: Married    Spouse name: Biochemist, clinical  . Number of children: 3  . Years of education: college   Occupational History  . Hair Stylist    Social History Main Topics  . Smoking status: Never Smoker  . Smokeless tobacco: Not on file  . Alcohol use No  . Drug use: No  . Sexual activity: Yes    Partners: Male   Other Topics Concern  . Not on file   Social History Narrative   No exercise.      Family History  Problem Relation Age of Onset  . Diabetes Father   . Cancer Father   . Hypertension    . Thyroid disease     . Colon cancer Daughter   . Glaucoma Mother   . Glaucoma Sister   . Glaucoma Sister   . Glaucoma Maternal Aunt     Outpatient Encounter Prescriptions as of 06/20/2016  Medication Sig  . ACCU-CHEK AVIVA PLUS test strip   . atorvastatin (LIPITOR) 10 MG tablet Take 1 tablet (10 mg total) by mouth daily at 6 PM. TAKE 1 TABLET (10 MG TOTAL) BY MOUTH DAILY.  Marland Kitchen Cholecalciferol (VITAMIN D3) 400 UNITS CAPS Take by mouth.  . losartan-hydrochlorothiazide (HYZAAR) 50-12.5 MG tablet Take 1 tablet by mouth daily.  . metFORMIN (GLUCOPHAGE-XR) 500 MG 24 hr tablet TAKE 2 TABLETS (1,000 MG TOTAL) BY MOUTH DAILY WITH BREAKFAST.  Marland Kitchen phentermine (ADIPEX-P) 37.5 MG tablet   . Probiotic Product (PROBIOTIC FORMULA PO) Take by mouth.  Marland Kitchen rOPINIRole (REQUIP) 3 MG tablet Take 1 tablet (3 mg total) by mouth daily.  . [DISCONTINUED] glipiZIDE (GLUCOTROL) 5 MG tablet Take 1 tablet (5 mg total) by mouth 2 (two) times daily before a meal.  . [DISCONTINUED] Multiple Vitamin (MULTIVITAMIN) capsule Take 1 capsule by mouth.   No facility-administered encounter medications on file as of 06/20/2016.        Review of Systems: No fevers,  chills, night sweats, weight loss, chest pain, or shortness of breath.   Objective:    General: Well Developed, well nourished, and in no acute distress.  Neuro: Alert and oriented x3, extra-ocular muscles intact, sensation grossly intact.  HEENT: Normocephalic, atraumatic  Skin: Warm and dry, no rashes.She does have a very large lipoma on her right upper arm above the elbow. Cardiac: Regular rate and rhythm, no murmurs rubs or gallops, no lower extremity edema.  Respiratory: Clear to auscultation bilaterally. Not using accessory muscles, speaking in full sentences.   Impression and Recommendations:   DM- On ARB.  A1c is stable at 7.0. She really feels like she is doing better though. She's been trying to eat more healthy and is actually lost most 5 pounds. We discussed options of  continuing current regimen and giving her 3 more months to try to get her A1c under 7 MRSA starting Victoza or Trulicity. She opted to work on diet and exercise first. Lab Results  Component Value Date   HGBA1C 7.0 06/20/2016   Hypertension-well-controlled. Continue current regimen. Follow-up in 3 months.  Lipoma, right upper arm-discussed surgical options. Certainly it is a benign condition so she doesn't have to have it removed but if she feels like it's actually getting larger and becoming more problematic than I would recommend it. I did explain to her that cosmetically it would have a divot and a significant scar and I want her to be aware of that. Recovery time would probably be about a week to 10 days. Certainly we can refer her at her convenience.

## 2016-07-08 ENCOUNTER — Emergency Department
Admission: EM | Admit: 2016-07-08 | Discharge: 2016-07-08 | Disposition: A | Discharge status: Home / Self Care | Payer: Medicare HMO | Source: Home / Self Care | Attending: Family Medicine | Admitting: Family Medicine

## 2016-07-08 ENCOUNTER — Encounter: Payer: Self-pay | Admitting: Emergency Medicine

## 2016-07-08 DIAGNOSIS — H66012 Acute suppurative otitis media with spontaneous rupture of ear drum, left ear: Secondary | ICD-10-CM | POA: Diagnosis not present

## 2016-07-08 MED ORDER — AMOXICILLIN-POT CLAVULANATE 875-125 MG PO TABS: 1.0000 | ORAL_TABLET | Freq: Two times a day (BID) | ORAL | 0 refills | Status: DC

## 2016-07-08 MED ORDER — TRAMADOL HCL 50 MG PO TABS
50.0000 mg | ORAL_TABLET | Freq: Four times a day (QID) | ORAL | 0 refills | Status: DC | PRN
Start: 2016-07-08 — End: 2016-07-19

## 2016-07-08 NOTE — Discharge Instructions (Signed)
°  Tramadol is strong pain medication. While taking, do not drink alcohol, drive, or perform any other activities that requires focus while taking these medications.   You may take 500mg  acetaminophen (Tylenol) every 4-6 hours, and 400-600mg  ibuprofen (Motrin or Advil) every 6-8 hours as needed for pain.

## 2016-07-08 NOTE — ED Provider Notes (Signed)
CSN: [id number]     Arrival date & time 07/08/16  1303 History   First MD Initiated Contact with Patient 07/08/16 1329     Chief Complaint  Patient presents with  . Otalgia   (Consider location/radiation/quality/duration/timing/severity/associated sxs/prior Treatment) HPI  Piney Lachman is a 70 y.o. female presenting to UC with c/o severe worsening Left ear pain that started about 10 days ago.  Pt was out of town when symptoms started so she went to a [place]. She was advised her ear canal was so swollen at the time, the provider could not see her ear drum. She was prescribed antibiotic ear drops for 7 days, today was the last day but pt notes pain is worse than ever before. Associated decreased hearing in Left ear since onset of symptoms over 1 week ago.  She has taken acetaminophen and ibuprofen w/o improvement. Denies fever, chills, n/v/d. Denies cough, congestion or sore throat. Pt notes she has never had this type of ear pain.    Past Medical History:  Diagnosis Date  . Diabetes mellitus without complication ([place])   . Hyperlipidemia   . Hypertension    Past Surgical History:  Procedure Laterality Date  . CHOLECYSTECTOMY     Family History  Problem Relation Age of Onset  . Diabetes Father   . Cancer Father   . Hypertension    . Thyroid disease    . Colon cancer Daughter   . Glaucoma Mother   . Glaucoma Sister   . Glaucoma Sister   . Glaucoma Maternal Aunt    Social History  Substance Use Topics  . Smoking status: Never Smoker  . Smokeless tobacco: Never Used  . Alcohol use No   OB History    No data available     Review of Systems  Constitutional: Negative for chills and fever.  HENT: Positive for ear pain (Left) and hearing loss (Left). Negative for congestion, ear discharge, facial swelling, sinus pressure and sore throat.   Respiratory: Negative for cough and wheezing.   Cardiovascular: Negative for chest pain.  Gastrointestinal: Negative for diarrhea, nausea  and vomiting.  Neurological: Negative for dizziness, light-headedness and headaches.    Allergies  Metformin and related and Invokana [canagliflozin]  Home Medications   Prior to Admission medications   Medication Sig Start Date End Date Taking? Authorizing Provider  ACCU-CHEK AVIVA PLUS test strip  12/16/14   Historical Provider, MD  amoxicillin-clavulanate (AUGMENTIN) 875-125 MG tablet Take 1 tablet by mouth 2 (two) times daily. One po bid x 7 days 07/08/16   Noland Fordyce, PA-C  atorvastatin (LIPITOR) 10 MG tablet Take 1 tablet (10 mg total) by mouth daily at 6 PM. TAKE 1 TABLET (10 MG TOTAL) BY MOUTH DAILY. 03/16/16   [name], MD  Cholecalciferol (VITAMIN D3) 400 UNITS CAPS Take by mouth.    Historical Provider, MD  losartan-hydrochlorothiazide (HYZAAR) 50-12.5 MG tablet Take 1 tablet by mouth daily. 12/28/15   [name], MD  metFORMIN (GLUCOPHAGE-XR) 500 MG 24 hr tablet TAKE 2 TABLETS (1,000 MG TOTAL) BY MOUTH DAILY WITH BREAKFAST. 06/14/16   [name], MD  phentermine (ADIPEX-P) 37.5 MG tablet  01/12/16   Historical Provider, MD  Probiotic Product (PROBIOTIC FORMULA PO) Take by mouth.    Historical Provider, MD  rOPINIRole (REQUIP) 3 MG tablet Take 1 tablet (3 mg total) by mouth daily. 03/16/16   [name], MD  traMADol (ULTRAM) 50 MG tablet Take 1 tablet (50 mg total)  by mouth every 6 (six) hours as needed. 07/08/16   Noland Fordyce, PA-C   Meds Ordered and Administered this Visit  Medications - No data to display  BP 139/77 (BP Location: Left Arm)   Pulse 73   Temp 97.9 F (36.6 C) (Oral)   Resp 16   Ht 5\' 2"  (1.575 m)   Wt 195 lb (88.5 kg)   SpO2 98%   BMI 35.67 kg/m  No data found.   Physical Exam  Constitutional: She is oriented to person, place, and time. She appears well-developed and well-nourished. She appears distressed.  Pt moving around in exam chair, appears uncomfortable, occasionally rubbing her Left ear.  HENT:  Head:  Normocephalic and atraumatic.  Right Ear: Tympanic membrane normal.  Left Ear: There is drainage ( yellow discharge filling ear canal. no blood). No mastoid tenderness.  Left ear: unable to visualize TM due to yellow discharge in ear canal. No bleeding.   Eyes: EOM are normal.  Neck: Normal range of motion. Neck supple.  Cardiovascular: Normal rate and regular rhythm.   Pulmonary/Chest: Effort normal and breath sounds normal. No stridor. No respiratory distress. She has no wheezes. She has no rales.  Musculoskeletal: Normal range of motion.  Lymphadenopathy:    She has no cervical adenopathy.  Neurological: She is alert and oriented to person, place, and time.  Skin: Skin is warm and dry. She is not diaphoretic.  Psychiatric: She has a normal mood and affect. Her behavior is normal.  Nursing note and vitals reviewed.   Urgent Care Course   Clinical Course    Procedures (including critical care time)  Labs Review Labs Reviewed - No data to display  Imaging Review No results found.   MDM   1. Acute suppurative otitis media of left ear with spontaneous rupture of tympanic membrane, recurrence not specified    Pt c/o severe Left ear pain despite completing a treatment for otitis externa in same ear. Yellow drainage in ear canal. Pt appears very uncomfortable.  Likely TM rupture due to AOM as pt is in severe pain, has discharge and reports decreased hearing. Rx: Augmentin and tramadol (advised pt tramadol can cause drowsiness, take as directed, do not drive while taking) May alternate acetaminophen and ibuprofen Encouraged f/u with PCP later this week if not improving, especially if worsening.  Patient verbalized understanding and agreement with treatment plan.      Noland Fordyce, PA-C 07/08/16 1417

## 2016-07-08 NOTE — ED Triage Notes (Signed)
Patient was traveling and had left ear pain over a week ago; went to an urgent care and was prescribed ear gtts. This has not cleared her ear pain. Took acetaminophen 1 hour ago.

## 2016-07-19 ENCOUNTER — Ambulatory Visit (INDEPENDENT_AMBULATORY_CARE_PROVIDER_SITE_OTHER): Payer: Medicare HMO | Admitting: Family Medicine

## 2016-07-19 ENCOUNTER — Encounter: Payer: Self-pay | Admitting: Family Medicine

## 2016-07-19 VITALS — BP 149/71 | HR 73 | Temp 97.9°F | Ht 63.0 in | Wt 194.0 lb

## 2016-07-19 DIAGNOSIS — B3731 Acute candidiasis of vulva and vagina: Secondary | ICD-10-CM

## 2016-07-19 DIAGNOSIS — B373 Candidiasis of vulva and vagina: Secondary | ICD-10-CM | POA: Diagnosis not present

## 2016-07-19 DIAGNOSIS — H6092 Unspecified otitis externa, left ear: Secondary | ICD-10-CM

## 2016-07-19 MED ORDER — FLUCONAZOLE 150 MG PO TABS: 150.0000 mg | ORAL_TABLET | Freq: Once | ORAL | 1 refills | Status: AC

## 2016-07-19 NOTE — Progress Notes (Addendum)
Patient ID: Jillian Huynh, female   DOB: 1946/03/12, 70 y.o.   MRN: [id number]  HPI 70 year-old caucasian female with diabetes presenting today with complaints of left-sided ear pain. Patient states that it began 3 weeks ago as "popping" in her ear. The patient notes that last Tuesday (07/10/16) she was in severe pain that she rated 10/10 with 10 being the worst pain she has ever felt. She states that on that Tuesday she felt like she had a fever with nausea and chills. She notes that the pain is a 0/10 today but she had black discharge when wiping her ear. The patient states that she completed a course of Augmentin, ear drops, and pain medication. She notes that although the pain improved she is still experiencing left-sided ear discomfort and decreased hearing. The patient states that she has been taking 4 tablets of Ibuprofen over the counter with minimal pain relief. Additionally, the patient states that she believes she has a vaginal yeast infection that began after taking her antibiotics. Patient denies recent air travel, trauma to the affected ear, current ear pain, or nausea.  Review of Systems  Constitutional: Negative for chills and fever.  HENT: Positive for congestion, ear pain, hearing loss and rhinorrhea. Negative for ear discharge.   Gastrointestinal: Negative for nausea.  Genitourinary: Positive for vaginal discharge.  Allergic/Immunologic: Negative for environmental allergies.   BP (!) 149/71 (BP Location: Left Arm, Patient Position: Sitting, Cuff Size: Normal)   Pulse 73   Temp 97.9 F (36.6 C)   Ht 5\' 3"  (1.6 m)   Wt 194 lb (88 kg)   SpO2 100%   BMI 34.37 kg/m     Allergies  Allergen Reactions  . Metformin And Related Nausea Only, Diarrhea and Nausea And Vomiting  . Invokana [Canagliflozin] Other (See Comments)    Yeast infection    Past Medical History:  Diagnosis Date  . Diabetes mellitus without complication ([place])   . Hyperlipidemia   . Hypertension     Past  Surgical History:  Procedure Laterality Date  . CHOLECYSTECTOMY      Social History   Social History  . Marital status: Married    Spouse name: Jillian Huynh, clinical  . Number of children: 3  . Years of education: college   Occupational History  . Hair Stylist    Social History Main Topics  . Smoking status: Never Smoker  . Smokeless tobacco: Never Used  . Alcohol use No  . Drug use: No  . Sexual activity: Yes    Partners: Male   Other Topics Concern  . Not on file   Social History Narrative   No exercise.      Family History  Problem Relation Age of Onset  . Diabetes Father   . Cancer Father   . Hypertension    . Thyroid disease    . Colon cancer Daughter   . Glaucoma Mother   . Glaucoma Sister   . Glaucoma Sister   . Glaucoma Maternal Aunt     Outpatient Encounter Prescriptions as of 07/19/2016  Medication Sig  . ACCU-CHEK AVIVA PLUS test strip   . atorvastatin (LIPITOR) 10 MG tablet Take 1 tablet (10 mg total) by mouth daily at 6 PM. TAKE 1 TABLET (10 MG TOTAL) BY MOUTH DAILY.  Marland Kitchen Cholecalciferol (VITAMIN D3) 400 UNITS CAPS Take by mouth.  . losartan-hydrochlorothiazide (HYZAAR) 50-12.5 MG tablet Take 1 tablet by mouth daily.  . metFORMIN (GLUCOPHAGE-XR) 500 MG 24 hr tablet TAKE 2  TABLETS (1,000 MG TOTAL) BY MOUTH DAILY WITH BREAKFAST.  Marland Kitchen phentermine (ADIPEX-P) 37.5 MG tablet   . Probiotic Product (PROBIOTIC FORMULA PO) Take by mouth.  Marland Kitchen rOPINIRole (REQUIP) 3 MG tablet Take 1 tablet (3 mg total) by mouth daily.  . [DISCONTINUED] traMADol (ULTRAM) 50 MG tablet Take 1 tablet (50 mg total) by mouth every 6 (six) hours as needed.  . fluconazole (DIFLUCAN) 150 MG tablet Take 1 tablet (150 mg total) by mouth once.  . [DISCONTINUED] amoxicillin-clavulanate (AUGMENTIN) 875-125 MG tablet Take 1 tablet by mouth 2 (two) times daily. One po bid x 7 days   No facility-administered encounter medications on file as of 07/19/2016.          Objective:   Physical Exam    Constitutional: She appears well-developed and well-nourished. No distress.  HENT:  Head: Normocephalic and atraumatic.  Right Ear: External ear and ear canal normal. No lacerations. No swelling or tenderness. No foreign bodies. No middle ear effusion.  Left Ear: There is tenderness. No foreign bodies. Decreased hearing is noted.  Ears:  Skin: She is not diaphoretic.       Assessment:    [name] was seen today for ear pain.  Diagnoses and all orders for this visit:  Otitis externa, left -     Ambulatory referral to ENT  Yeast infection of the vagina  Other orders -     fluconazole (DIFLUCAN) 150 MG tablet; Take 1 tablet (150 mg total) by mouth once.       Plan:     1. Left-sided otitis externa- Patient presented with 3 week history of left-sided earache that failed treatment with augmentin and ear drops. Suspect that patients ear pathology is secondary to a fungal infection due to the patients history of diabetes. Patient given ambulatory referral to ENT today for debridement and further evaluation. Will continue to monitor.   2. Yeast infection of the vagina - Suspect patient developed a yeast infection secondary to her diabetes and recent course of antibiotics. Patient to start on fluconazole 150mg  tablets once daily. Patient to return to clinic if symptoms do not improve or worsen.

## 2016-08-06 ENCOUNTER — Other Ambulatory Visit: Payer: Self-pay | Admitting: Family Medicine

## 2016-08-06 DIAGNOSIS — Z139 Encounter for screening, unspecified: Secondary | ICD-10-CM

## 2016-08-22 ENCOUNTER — Ambulatory Visit (INDEPENDENT_AMBULATORY_CARE_PROVIDER_SITE_OTHER): Payer: Medicare HMO

## 2016-08-22 DIAGNOSIS — Z1231 Encounter for screening mammogram for malignant neoplasm of breast: Secondary | ICD-10-CM | POA: Diagnosis not present

## 2016-08-22 DIAGNOSIS — Z139 Encounter for screening, unspecified: Secondary | ICD-10-CM

## 2016-09-19 ENCOUNTER — Ambulatory Visit (INDEPENDENT_AMBULATORY_CARE_PROVIDER_SITE_OTHER): Payer: Medicare HMO | Admitting: Family Medicine

## 2016-09-19 ENCOUNTER — Encounter: Payer: Self-pay | Admitting: Family Medicine

## 2016-09-19 VITALS — BP 137/72 | HR 69 | Wt 191.0 lb

## 2016-09-19 DIAGNOSIS — E119 Type 2 diabetes mellitus without complications: Secondary | ICD-10-CM | POA: Diagnosis not present

## 2016-09-19 DIAGNOSIS — Z1159 Encounter for screening for other viral diseases: Secondary | ICD-10-CM | POA: Diagnosis not present

## 2016-09-19 DIAGNOSIS — I10 Essential (primary) hypertension: Secondary | ICD-10-CM

## 2016-09-19 LAB — LIPID PANEL
CHOL/HDL RATIO: 4.1 ratio (ref <5.0)
CHOLESTEROL: 164 mg/dL (ref <200)
HDL: 40 mg/dL — AB (ref >50)
LDL CALC: 67 mg/dL
TRIGLYCERIDES: 287 mg/dL — AB (ref <150)
VLDL: 57 mg/dL — AB (ref <30)

## 2016-09-19 LAB — COMPLETE METABOLIC PANEL WITH GFR
ALT: 17 U/L (ref 6–29)
AST: 15 U/L (ref 10–35)
Albumin: 4.3 g/dL (ref 3.6–5.1)
Alkaline Phosphatase: 78 U/L (ref 33–130)
BUN: 17 mg/dL (ref 7–25)
CHLORIDE: 101 mmol/L (ref 98–110)
CO2: 25 mmol/L (ref 20–31)
CREATININE: 0.55 mg/dL (ref 0.50–0.99)
Calcium: 9.7 mg/dL (ref 8.6–10.4)
Glucose, Bld: 133 mg/dL — ABNORMAL HIGH (ref 65–99)
POTASSIUM: 4.5 mmol/L (ref 3.5–5.3)
Sodium: 137 mmol/L (ref 135–146)
Total Bilirubin: 0.7 mg/dL (ref 0.2–1.2)
Total Protein: 6.5 g/dL (ref 6.1–8.1)

## 2016-09-19 LAB — POCT GLYCOSYLATED HEMOGLOBIN (HGB A1C): HEMOGLOBIN A1C: 7

## 2016-09-19 NOTE — Progress Notes (Signed)
Subjective:    CC: DM  HPI:   Diabetes - no hypoglycemic events. No wounds or sores that are not healing well. No increased thirst or urination. Checking glucose at home. Taking medications as prescribed without any side effects.  Hypertension- Pt denies chest pain, SOB, dizziness, or heart palpitations.  Taking meds as directed w/o problems.  Denies medication side effects.     Past medical history, Surgical history, Family history not pertinant except as noted below, Social history, Allergies, and medications have been entered into the medical record, reviewed, and corrections made.   Review of Systems: No fevers, chills, night sweats, weight loss, chest pain, or shortness of breath.   Objective:    General: Well Developed, well nourished, and in no acute distress.  Neuro: Alert and oriented x3, extra-ocular muscles intact, sensation grossly intact.  HEENT: Normocephalic, atraumatic  Skin: Warm and dry, no rashes. Cardiac: Regular rate and rhythm, no murmurs rubs or gallops, no lower extremity edema.  Respiratory: Clear to auscultation bilaterally. Not using accessory muscles, speaking in full sentences.   Impression and Recommendations:   DM - Stable. Discussed goal to get it underneath 7.0.  We discussed adding a second medication. She really wants to work on diet and exercise. She admits she still eating a fair amount of sweets and feels like she has a lot of room for improvement. Follow-up in 3 months. Continue Lipitor.   Lab Results  Component Value Date   HGBA1C 7.0 09/19/2016    HTN - Well controlled. Continue current regimen. Follow up in  3 months.

## 2016-09-20 LAB — HEPATITIS C ANTIBODY: HCV Ab: NEGATIVE

## 2016-11-19 ENCOUNTER — Ambulatory Visit (INDEPENDENT_AMBULATORY_CARE_PROVIDER_SITE_OTHER): Payer: Medicare HMO | Admitting: Physician Assistant

## 2016-11-19 ENCOUNTER — Encounter: Payer: Self-pay | Admitting: Physician Assistant

## 2016-11-19 VITALS — BP 147/73 | HR 80 | Temp 97.6°F | Ht 63.0 in | Wt 189.0 lb

## 2016-11-19 DIAGNOSIS — J4521 Mild intermittent asthma with (acute) exacerbation: Secondary | ICD-10-CM

## 2016-11-19 MED ORDER — IPRATROPIUM-ALBUTEROL 0.5-2.5 (3) MG/3ML IN SOLN
3.0000 mL | Freq: Once | RESPIRATORY_TRACT | Status: AC
  Administered 2016-11-19: 3 mL via RESPIRATORY_TRACT

## 2016-11-19 MED ORDER — AZITHROMYCIN 250 MG PO TABS: ORAL_TABLET | ORAL | 0 refills | Status: DC

## 2016-11-19 MED ORDER — PREDNISONE 50 MG PO TABS: ORAL_TABLET | ORAL | 0 refills | Status: DC

## 2016-11-19 MED ORDER — ALBUTEROL SULFATE HFA 108 (90 BASE) MCG/ACT IN AERS: 2.0000 | INHALATION_SPRAY | Freq: Four times a day (QID) | RESPIRATORY_TRACT | 1 refills | Status: DC | PRN

## 2016-11-19 NOTE — Progress Notes (Addendum)
   Subjective:    Patient ID: Jillian Huynh, female    DOB: [birth year], 71 y.o.   MRN: [id number]  HPI Patient is a 71yo female who presents to the clinic with a dry cough for 1 week.  Patient states her cough is dry, but she is unable to cough up mucus and it feels like "a brick is in her chest."  Patient reports she started with a sore throat, which has since resolved.  Patient reports she is still experiencing sinus congestion, PND, fatigue, chills, sweating,and body aches.  Patient denies any nausea, vomiting, or diarrhea.  Patient states she has tried OTC products and has used Vick's with minimal to no relief.  Patient denies receiving a flu shot and denies any sick contacts.      Review of Systems  Constitutional: Positive for chills and fatigue.  HENT: Positive for congestion, postnasal drip and sinus pressure.   Respiratory: Positive for cough and chest tightness.   All other systems reviewed and are negative.      Objective:   Physical Exam  Constitutional: She appears well-developed and well-nourished.  HENT:  Head: Normocephalic and atraumatic.  Right Ear: External ear normal.  Left Ear: External ear normal.  Nose: Nose normal.  Mouth/Throat: Oropharynx is clear and moist.  Eyes: Conjunctivae are normal.  Neck: Normal range of motion. Neck supple.  Cardiovascular: Normal rate, regular rhythm and normal heart sounds.   Pulmonary/Chest:  Right lower lobe: crackles heard.    Lymphadenopathy:    She has no cervical adenopathy.  Psychiatric: She has a normal mood and affect. Her behavior is normal.  Nursing note and vitals reviewed.         Assessment & Plan:  Marland KitchenMarland KitchenDiagnoses and all orders for this visit:  Mild intermittent asthmatic bronchitis with acute exacerbation -     azithromycin (ZITHROMAX) 250 MG tablet; Take 2 tablets now and then one tablet for 4 days. -     predniSONE (DELTASONE) 50 MG tablet; Take one tablet for 5 days. -     albuterol (PROVENTIL  HFA;VENTOLIN HFA) 108 (90 Base) MCG/ACT inhaler; Inhale 2 puffs into the lungs every 6 (six) hours as needed for wheezing or shortness of breath.  This patient will be treated for acute bronchitis with azithromycin 250mg  and prednisone 50mg . I did hear some crackles that made me suspicious for pneumonia. Patient will also be given an albuterol inhaler, which she can use PRN for increased shortness of breath or during coughing episodes.  Patient was given a duo neb in the clinic today.   Patient declined chest x-ray today, but she agrees that if she does not improve by Friday she will return and have a CXR.

## 2016-11-19 NOTE — Patient Instructions (Signed)

## 2016-11-29 ENCOUNTER — Other Ambulatory Visit: Payer: Self-pay | Admitting: Family Medicine

## 2016-12-26 ENCOUNTER — Ambulatory Visit (INDEPENDENT_AMBULATORY_CARE_PROVIDER_SITE_OTHER): Payer: Medicare HMO | Admitting: Family Medicine

## 2016-12-26 ENCOUNTER — Ambulatory Visit: Payer: Medicare HMO | Admitting: *Deleted

## 2016-12-26 ENCOUNTER — Encounter: Payer: Self-pay | Admitting: *Deleted

## 2016-12-26 ENCOUNTER — Encounter: Payer: Self-pay | Admitting: Family Medicine

## 2016-12-26 VITALS — BP 130/78 | HR 64 | Temp 97.7°F | Ht 63.0 in | Wt 188.1 lb

## 2016-12-26 VITALS — BP 130/78 | HR 64 | Wt 188.0 lb

## 2016-12-26 DIAGNOSIS — E119 Type 2 diabetes mellitus without complications: Secondary | ICD-10-CM | POA: Diagnosis not present

## 2016-12-26 DIAGNOSIS — E669 Obesity, unspecified: Secondary | ICD-10-CM | POA: Diagnosis not present

## 2016-12-26 DIAGNOSIS — Z0000 Encounter for general adult medical examination without abnormal findings: Secondary | ICD-10-CM

## 2016-12-26 DIAGNOSIS — H60331 Swimmer's ear, right ear: Secondary | ICD-10-CM

## 2016-12-26 DIAGNOSIS — I10 Essential (primary) hypertension: Secondary | ICD-10-CM | POA: Diagnosis not present

## 2016-12-26 DIAGNOSIS — Z6833 Body mass index (BMI) 33.0-33.9, adult: Secondary | ICD-10-CM

## 2016-12-26 LAB — POCT GLYCOSYLATED HEMOGLOBIN (HGB A1C): Hemoglobin A1C: 7.5

## 2016-12-26 MED ORDER — METFORMIN HCL ER 500 MG PO TB24: 1000.0000 mg | ORAL_TABLET | Freq: Every day | ORAL | 3 refills | Status: DC

## 2016-12-26 MED ORDER — NEOMYCIN-POLYMYXIN-HC 3.5-10000-1 OT SOLN: 4.0000 [drp] | Freq: Four times a day (QID) | OTIC | 0 refills | Status: DC

## 2016-12-26 MED ORDER — LOSARTAN POTASSIUM-HCTZ 50-12.5 MG PO TABS: 1.0000 | ORAL_TABLET | Freq: Every day | ORAL | 1 refills | Status: DC

## 2016-12-26 MED ORDER — GLIPIZIDE ER 2.5 MG PO TB24: 2.5000 mg | ORAL_TABLET | Freq: Every day | ORAL | 1 refills | Status: DC

## 2016-12-26 NOTE — Progress Notes (Signed)
Subjective:   Jillian Huynh is a 71 y.o. female who presents for an Initial Medicare Annual Wellness Visit.  Review of Systems          Objective:    Today's Vitals   12/26/16 0952  BP: 130/78  Pulse: 64  Temp: 97.7 F (36.5 C)  TempSrc: Oral  Weight: 188 lb 1.6 oz (85.3 kg)  Height: 5\' 3"  (1.6 m)   Body mass index is 33.32 kg/m.   Current Medications (verified) Outpatient Encounter Prescriptions as of 12/26/2016  Medication Sig  . ACCU-CHEK AVIVA PLUS test strip   . atorvastatin (LIPITOR) 10 MG tablet Take 1 tablet (10 mg total) by mouth daily at 6 PM. TAKE 1 TABLET (10 MG TOTAL) BY MOUTH DAILY.  Marland Kitchen Cholecalciferol (VITAMIN D3) 400 UNITS CAPS Take by mouth.  . Probiotic Product (PROBIOTIC FORMULA PO) Take by mouth.  Marland Kitchen rOPINIRole (REQUIP) 3 MG tablet Take 1 tablet (3 mg total) by mouth daily.  . [DISCONTINUED] losartan-hydrochlorothiazide (HYZAAR) 50-12.5 MG tablet Take 1 tablet by mouth daily. NEED FOLLOW UP APPOINTMENT FOR MORE REIFLLS  . [DISCONTINUED] metFORMIN (GLUCOPHAGE-XR) 500 MG 24 hr tablet TAKE 2 TABLETS (1,000 MG TOTAL) BY MOUTH DAILY WITH BREAKFAST.  Marland Kitchen albuterol (PROVENTIL HFA;VENTOLIN HFA) 108 (90 Base) MCG/ACT inhaler Inhale 2 puffs into the lungs every 6 (six) hours as needed for wheezing or shortness of breath.  . [DISCONTINUED] azithromycin (ZITHROMAX) 250 MG tablet Take 2 tablets now and then one tablet for 4 days. (Patient not taking: Reported on 12/26/2016)  . [DISCONTINUED] phentermine (ADIPEX-P) 37.5 MG tablet   . [DISCONTINUED] predniSONE (DELTASONE) 50 MG tablet Take one tablet for 5 days. (Patient not taking: Reported on 12/26/2016)   No facility-administered encounter medications on file as of 12/26/2016.     Allergies (verified) Invokana [canagliflozin]   History: Past Medical History:  Diagnosis Date  . Diabetes mellitus without complication ([place])   . Hyperlipidemia   . Hypertension    Past Surgical History:  Procedure Laterality Date  .  CHOLECYSTECTOMY     Family History  Problem Relation Age of Onset  . Diabetes Father   . Cancer Father 55    Esophogeal Ca., Lung.  Marland Kitchen Hypertension    . Thyroid disease    . Colon cancer Daughter 81  . Glaucoma Mother   . Hepatitis C Mother 21    Died from.  . Glaucoma Sister   . Glaucoma Sister   . Glaucoma Maternal Aunt    Social History   Occupational History  . Hair Stylist    Social History Main Topics  . Smoking status: Never Smoker  . Smokeless tobacco: Never Used  . Alcohol use No  . Drug use: No  . Sexual activity: Yes    Partners: Male    Tobacco Counseling Counseling given: Not Answered   Activities of Daily Living No flowsheet data found.  Immunizations and Health Maintenance Immunization History  Administered Date(s) Administered  . Tdap 03/26/2007, 06/29/2015   There are no preventive care reminders to display for this patient.  Patient Care Team: [name], MD as PCP - General (Family Medicine)  Indicate any recent Medical Services you may have received from other than [facility] providers in the past year (date may be approximate).     Assessment:   This is a routine wellness examination for [name].   Hearing/Vision screen No exam data present  Dietary issues and exercise activities discussed:    Goals    .  Increase physical activity (pt-stated)          She would like to lose 15 pounds in the next year and increase her stamina.      Depression Screen PHQ 2/9 Scores 12/26/2016 06/20/2016 03/28/2015  PHQ - 2 Score 0 0 0    Fall Risk Fall Risk  12/26/2016 06/20/2016 03/28/2015  Falls in the past year? No No No    Cognitive Function:Normal cognitive function by direct observation and interview with the patient.     6CIT Screen 12/26/2016  What Year? 0 points  What month? 0 points  What time? 0 points  Count back from 20 0 points  Months in reverse 0 points  Repeat phrase 0 points  Total Score 0    Screening Tests Health  Maintenance  Topic Date Due  . INFLUENZA VACCINE  11/12/2019 (Originally 06/12/2016)  . ZOSTAVAX  09/19/2021 (Originally 11/05/2006)  . PNA vac Low Risk Adult (1 of 2 - PCV13) 09/19/2021 (Originally 11/06/2011)  . OPHTHALMOLOGY EXAM  03/13/2017  . HEMOGLOBIN A1C  03/19/2017  . FOOT EXAM  06/20/2017  . MAMMOGRAM  08/22/2018  . COLONOSCOPY  12/29/2022  . TETANUS/TDAP  06/28/2025  . DEXA SCAN  Completed  . Hepatitis C Screening  Completed      Plan:    I have personally reviewed and addressed the Medicare Annual Wellness questionnaire and have noted the following in the patient's chart:  A. Medical and social history B. Use of alcohol, tobacco or illicit drugs-Does not consume alcohol or use tobacco products.  C. Current medications and supplements D. Functional ability and status E.  Nutritional status-would like to lose 10-15# in the next year or by the time she comes back for her next AWV.  She believes this would improve her stamina. F.  Physical activity-She currently has no exercise program but we discussed ways to improve this, which would aid in her weight loss goal by the time she has her next AWV. G. Advance directives-Discussed.  Will try to bring copies for chart. H. List of other physicians I.  Hospitalizations, surgeries, and ER visits in previous 12 months J.  Vitals-WNL.  B/P more elevated at beginning of visit but rechecked and b/p had improved. K. Screenings to include cognitive (normal by direct observation and interview with pt.), depression (PHQ-2 assessment and WNL; no s/s depression.). L. Referrals and appointments - Mammogram due in 08-2017-Discussed her making this appt. With her verbalizing understanding of this.  Stated it had been a while since her last DEXA scan but she will discuss this at her next visit with Dr. Madilyn Fireman.  She declined getting her flu vaccine.  She stated she never takes one.    In addition, I have reviewed and discussed with patient certain  preventive protocols, quality metrics, and best practice recommendations. A written personalized care plan for preventive services as well as general preventive health recommendations were provided to patient.  Signed,   METHENEY,CATHERINE, MD

## 2016-12-26 NOTE — Progress Notes (Signed)
Reviewed note above.  Agree with above.   Beatrice Lecher, MD

## 2016-12-26 NOTE — Patient Instructions (Addendum)
Otitis Externa Otitis externa is a germ infection in the outer ear. The outer ear is the area from the eardrum to the outside of the ear. Otitis externa is sometimes called "swimmer's ear." HOME CARE  Put drops in the ear as told by your doctor.  Only take medicine as told by your doctor.  If you have diabetes, your doctor may give you more directions. Follow your doctor's directions.  Keep all doctor visits as told. To avoid another infection:  Keep your ear dry. Use the corner of a towel to dry your ear after swimming or bathing.  Avoid scratching or putting things inside your ear.  Avoid swimming in lakes, dirty water, or pools that use a chemical called chlorine poorly.  You may use ear drops after swimming. Combine equal amounts of white vinegar and alcohol in a bottle. Put 3 or 4 drops in each ear. GET HELP IF:   You have a fever.  Your ear is still red, puffy (swollen), or painful after 3 days.  You still have yellowish-white fluid (pus) coming from the ear after 3 days.  Your redness, puffiness, or pain gets worse.  You have a really bad headache.  You have redness, puffiness, pain, or tenderness behind your ear. MAKE SURE YOU:   Understand these instructions.  Will watch your condition.  Will get help right away if you are not doing well or get worse. This information is not intended to replace advice given to you by your health care provider. Make sure you discuss any questions you have with your health care provider. Document Released: 04/16/2008 Document Revised: 11/19/2014 Document Reviewed: 08/08/2015 Elsevier Interactive Patient Education  2017 Elsevier Inc.  

## 2016-12-26 NOTE — Patient Instructions (Addendum)
Health maintenance: You are due for your mammogram in October of 2018.  Please call and make this appt ahead of time.  Consider getting your flu vaccine.   Abnormal screenings: None.   Patient concerns: None mentioned.  Next PCP appt: Follow up with Dr. Madilyn Fireman as advised at end of your visit with her today.  You mentioned it had been a while since your last bone density scan.  Consider getting an appt for this.  I brought this to Dr. Gardiner Ramus attention. Follow up with [facility] Densitometry Introduction Bone densitometry is an imaging test that uses a special X-ray to measure the amount of calcium and other minerals in your bones (bone density). This test is also known as a bone mineral density test or dual-energy X-ray absorptiometry (DXA). The test can measure bone density at your hip and your spine. It is similar to having a regular X-ray. You may have this test to:  Diagnose a condition that causes weak or thin bones (osteoporosis).  Predict your risk of a broken bone (fracture).  Determine how well osteoporosis treatment is working. Tell a health care provider about:  Any allergies you have.  All medicines you are taking, including vitamins, herbs, eye drops, creams, and over-the-counter medicines.  Any problems you or family members have had with anesthetic medicines.  Any blood disorders you have.  Any surgeries you have had.  Any medical conditions you have.  Possibility of pregnancy.  Any other medical test you had within the previous 14 days that used contrast material. What are the risks? Generally, this is a safe procedure. However, problems can occur and may include the following:  This test exposes you to a very small amount of radiation.  The risks of radiation exposure may be greater to unborn children. What happens before the procedure?  Do not take any calcium supplements for 24 hours before having the test. You can otherwise eat and drink what you  usually do.  Take off all metal jewelry, eyeglasses, dental appliances, and any other metal objects. What happens during the procedure?  You may lie on an exam table. There will be an X-ray generator below you and an imaging device above you.  Other devices, such as boxes or braces, may be used to position your body properly for the scan.  You will need to lie still while the machine slowly scans your body.  The images will show up on a computer monitor. What happens after the procedure? You may need more testing at a later time. This information is not intended to replace advice given to you by your health care provider. Make sure you discuss any questions you have with your health care provider. Document Released: 11/20/2004 Document Revised: 04/05/2016 Document Reviewed: 04/08/2014  2017 Elsevier Olena Leatherwood, RN in a year for your AWV in one year.

## 2016-12-26 NOTE — Progress Notes (Signed)
Subjective:    CC: DM  HPI: Diabetes - no hypoglycemic events. No wounds or sores that are not healing well. No increased thirst or urination. Checking glucose at home. Taking medications as prescribed without any side effects.  Hypertension- Pt denies chest pain, SOB, dizziness, or heart palpitations.  Taking meds as directed w/o problems.  Denies medication side effects.    Right pain and fullness. Says had URI for about 4 weeksShe still has some residual symptoms but overall feels better but for the last few days her right ear has been bothering her and she would like me to look at it today.  Obesity/BMI 33-she says she has been trying to work on losing weight. She is actually down 1 pound since I last saw her.  Past medical history, Surgical history, Family history not pertinant except as noted below, Social history, Allergies, and medications have been entered into the medical record, reviewed, and corrections made.   Review of Systems: No fevers, chills, night sweats, weight loss, chest pain, or shortness of breath.   Objective:    General: Well Developed, well nourished, and in no acute distress.  Neuro: Alert and oriented x3, extra-ocular muscles intact, sensation grossly intact.  HEENT: Normocephalic, atraumatic.  Right canal filled with white debris unable to visualize the tympanic membrane. Left TM canal are clear. Oropharynx is clear. No significant cervical lymphadenopathy.  Skin: Warm and dry, no rashes. Cardiac: Regular rate and rhythm, no murmurs rubs or gallops, no lower extremity edema.  Respiratory: Clear to auscultation bilaterally. Not using accessory muscles, speaking in full sentences.   Impression and Recommendations:   DM- A1C of 7.5 today. Uncontrolled. Discussed options with her. She really wants to avoid any of the more expensive diabetic medications but she had some weight gain previously with glipizide. She is willing to try very low-dose of glipizide so  we'll start with 2.5 mg extended release and see her back in 3 months. In the interim encourage her to really work hard on healthy diet, exercise and weight loss. She actually did lose 1 pound since I last saw her.  HTN - Well controlled. Continue current regimen. Follow up in  3-4 months.    Right otitis externa-we'll treat with Polysporin drops. Call if not better in 1-2 weeks.  Obeity/BMI 33 - tinea work on healthy diet and regular exercise and weight loss. His make a big difference in her glucose levels.

## 2017-03-25 ENCOUNTER — Encounter: Payer: Self-pay | Admitting: Family Medicine

## 2017-03-25 ENCOUNTER — Ambulatory Visit (INDEPENDENT_AMBULATORY_CARE_PROVIDER_SITE_OTHER): Payer: Medicare HMO | Admitting: Family Medicine

## 2017-03-25 VITALS — BP 135/73 | HR 70 | Ht 63.0 in | Wt 190.0 lb

## 2017-03-25 DIAGNOSIS — I10 Essential (primary) hypertension: Secondary | ICD-10-CM | POA: Diagnosis not present

## 2017-03-25 DIAGNOSIS — E119 Type 2 diabetes mellitus without complications: Secondary | ICD-10-CM | POA: Diagnosis not present

## 2017-03-25 LAB — POCT UA - MICROALBUMIN
CREATININE, POC: 200 mg/dL
MICROALBUMIN (UR) POC: 80 mg/L

## 2017-03-25 LAB — POCT GLYCOSYLATED HEMOGLOBIN (HGB A1C): Hemoglobin A1C: 7.1

## 2017-03-25 NOTE — Progress Notes (Signed)
Subjective:    CC: DM, HTN  HPI:   Diabetes - no hypoglycemic events. No wounds or sores that are not healing well. No increased thirst or urination. Checking glucose at home. Taking medications as prescribed without any side effects.We have added a low-dose of glipizide back when I saw her last time. She was very hesitant to go on it because of the concern about weight gain and she had taken it previously but that point I felt like we need to do something to try to get her back on track.  Hypertension- Pt denies chest pain, SOB, dizziness, or heart palpitations.  Taking meds as directed w/o problems.  Denies medication side effects.      Past medical history, Surgical history, Family history not pertinant except as noted below, Social history, Allergies, and medications have been entered into the medical record, reviewed, and corrections made.   Review of Systems: No fevers, chills, night sweats, weight loss, chest pain, or shortness of breath.   Objective:    General: Well Developed, well nourished, and in no acute distress.  Neuro: Alert and oriented x3, extra-ocular muscles intact, sensation grossly intact.  HEENT: Normocephalic, atraumatic  Skin: Warm and dry, no rashes. Cardiac: Regular rate and rhythm, no murmurs rubs or gallops, no lower extremity edema.  Respiratory: Clear to auscultation bilaterally. Not using accessory muscles, speaking in full sentences.   Impression and Recommendations:   DM- Uncontrolled . A1C is 7.1.  IMproved from 7.5.  Reminded to get her eye exa.   HTN - Well controlled. Continue current regimen. Follow up in  3-4 months.   She wants to consider pneumonia vaccine at next OV.

## 2017-05-03 ENCOUNTER — Other Ambulatory Visit: Payer: Self-pay | Admitting: Family Medicine

## 2017-05-22 ENCOUNTER — Other Ambulatory Visit: Payer: Self-pay | Admitting: Family Medicine

## 2017-05-27 ENCOUNTER — Telehealth: Payer: Self-pay

## 2017-05-27 MED ORDER — ROPINIROLE HCL 3 MG PO TABS: 3.0000 mg | ORAL_TABLET | Freq: Every day | ORAL | 0 refills | Status: DC

## 2017-05-27 NOTE — Telephone Encounter (Signed)
Pt called stating she has been out of her RLS medication Requip for 2 days and isn't getting any sleep due to pain. Would like to know if you can send her in a temporary amount until [facility] delivers her meds. Please advise

## 2017-05-27 NOTE — Telephone Encounter (Signed)
Orders placed. Attempted to call pt, no answer.

## 2017-05-27 NOTE — Telephone Encounter (Signed)
Yes OK to send 30 days supply to local

## 2017-06-24 ENCOUNTER — Ambulatory Visit (INDEPENDENT_AMBULATORY_CARE_PROVIDER_SITE_OTHER): Payer: Medicare HMO | Admitting: Family Medicine

## 2017-06-24 ENCOUNTER — Other Ambulatory Visit: Payer: Self-pay

## 2017-06-24 ENCOUNTER — Telehealth: Payer: Self-pay | Admitting: Family Medicine

## 2017-06-24 VITALS — BP 135/58 | HR 56 | Wt 194.0 lb

## 2017-06-24 DIAGNOSIS — E119 Type 2 diabetes mellitus without complications: Secondary | ICD-10-CM | POA: Diagnosis not present

## 2017-06-24 DIAGNOSIS — I10 Essential (primary) hypertension: Secondary | ICD-10-CM | POA: Diagnosis not present

## 2017-06-24 DIAGNOSIS — G2581 Restless legs syndrome: Secondary | ICD-10-CM | POA: Diagnosis not present

## 2017-06-24 LAB — POCT GLYCOSYLATED HEMOGLOBIN (HGB A1C): Hemoglobin A1C: 6.9

## 2017-06-24 MED ORDER — LOSARTAN POTASSIUM-HCTZ 50-12.5 MG PO TABS: 1.0000 | ORAL_TABLET | Freq: Every day | ORAL | 0 refills | Status: DC

## 2017-06-24 MED ORDER — ROPINIROLE HCL 3 MG PO TABS: 3.0000 mg | ORAL_TABLET | Freq: Every day | ORAL | 3 refills | Status: DC

## 2017-06-24 NOTE — Telephone Encounter (Signed)
I called [employer] and they state the Losartan-HCTZ was delivered to her home on June 29th for 90 day refill. She is not due for a refill. I called [facility] and she states she must have misplaced it but doesn't remember getting it. I sent her a coupon from [employer] for $10 for 30 days and sent a refill to [facility]. Kristopher Oppenheim was the cheapest in [place].

## 2017-06-24 NOTE — Progress Notes (Signed)
Subjective:    CC: DM, HTN   HPI:  Diabetes - no hypoglycemic events. No wounds or sores that are not healing well. No increased thirst or urination. Checking glucose at home. Taking medications as prescribed without any side effects.  Hypertension- Pt denies chest pain, SOB, dizziness, or heart palpitations.  Taking meds as directed w/o problems.  Denies medication side effects.    RLS - says Was unable to get her Requip from [place]. She said that the store would not release it to her. She really cannot sleep without it because her restless like this is severe.  Past medical history, Surgical history, Family history not pertinant except as noted below, Social history, Allergies, and medications have been entered into the medical record, reviewed, and corrections made.   Review of Systems: No fevers, chills, night sweats, weight loss, chest pain, or shortness of breath.   Objective:    General: Well Developed, well nourished, and in no acute distress.  Neuro: Alert and oriented x3, extra-ocular muscles intact, sensation grossly intact.  HEENT: Normocephalic, atraumatic  Skin: Warm and dry, no rashes. Cardiac: Regular rate and rhythm, no murmurs rubs or gallops, no lower extremity edema.  Respiratory: Clear to auscultation bilaterally. Not using accessory muscles, speaking in full sentences.   Impression and Recommendations:    DM- Well controlled. Continue current regimen. Follow up in  3 months.     HTN  - Well controlled. We refilled her medication and send it to her mail order, [facility] about 6 weeks ago but she said she still has not received it. So we'll send a 14 day supply to local pharmacy and call [employer] to find out what's going on with her medication. Continue current regimen. Follow up in  4 months  RLS - will send med to [facility]

## 2017-06-24 NOTE — Telephone Encounter (Signed)
Please call [employer] and see if they have filled her prescription for losartan HCTZ. It looks like it was sent about 6 weeks ago the patient said is that she has not received it. I did go ahead and send in a 14 day local supply to [facility] for her.

## 2017-06-26 LAB — HM DIABETES EYE EXAM

## 2017-09-25 ENCOUNTER — Ambulatory Visit: Payer: Medicare HMO | Admitting: Family Medicine

## 2017-09-25 ENCOUNTER — Encounter: Payer: Self-pay | Admitting: Family Medicine

## 2017-09-25 VITALS — BP 138/74 | HR 69 | Ht 63.0 in | Wt 200.0 lb

## 2017-09-25 DIAGNOSIS — I10 Essential (primary) hypertension: Secondary | ICD-10-CM

## 2017-09-25 DIAGNOSIS — E119 Type 2 diabetes mellitus without complications: Secondary | ICD-10-CM

## 2017-09-25 DIAGNOSIS — E785 Hyperlipidemia, unspecified: Secondary | ICD-10-CM | POA: Diagnosis not present

## 2017-09-25 DIAGNOSIS — R635 Abnormal weight gain: Secondary | ICD-10-CM | POA: Diagnosis not present

## 2017-09-25 DIAGNOSIS — Z800 Family history of malignant neoplasm of digestive organs: Secondary | ICD-10-CM | POA: Diagnosis not present

## 2017-09-25 LAB — COMPLETE METABOLIC PANEL WITH GFR
AG Ratio: 1.9 (calc) (ref 1.0–2.5)
ALBUMIN MSPROF: 4.3 g/dL (ref 3.6–5.1)
ALKALINE PHOSPHATASE (APISO): 72 U/L (ref 33–130)
ALT: 16 U/L (ref 6–29)
AST: 15 U/L (ref 10–35)
BILIRUBIN TOTAL: 0.6 mg/dL (ref 0.2–1.2)
BUN: 20 mg/dL (ref 7–25)
CHLORIDE: 101 mmol/L (ref 98–110)
CO2: 27 mmol/L (ref 20–32)
Calcium: 10.1 mg/dL (ref 8.6–10.4)
Creat: 0.6 mg/dL (ref 0.60–0.93)
GFR, EST AFRICAN AMERICAN: 107 mL/min/{1.73_m2} (ref >=60)
GFR, Est Non African American: 92 mL/min/{1.73_m2} (ref >=60)
GLOBULIN: 2.3 g/dL (ref 1.9–3.7)
Glucose, Bld: 137 mg/dL — ABNORMAL HIGH (ref 65–99)
Potassium: 4.2 mmol/L (ref 3.5–5.3)
SODIUM: 137 mmol/L (ref 135–146)
TOTAL PROTEIN: 6.6 g/dL (ref 6.1–8.1)

## 2017-09-25 LAB — LIPID PANEL
CHOL/HDL RATIO: 5.4 (calc) — AB (ref <5.0)
CHOLESTEROL: 245 mg/dL — AB (ref <200)
HDL: 45 mg/dL — ABNORMAL LOW (ref >50)
Non-HDL Cholesterol (Calc): 200 mg/dL (calc) — ABNORMAL HIGH (ref <130)
Triglycerides: 430 mg/dL — ABNORMAL HIGH (ref <150)

## 2017-09-25 LAB — POCT GLYCOSYLATED HEMOGLOBIN (HGB A1C): HEMOGLOBIN A1C: 7.1

## 2017-09-25 MED ORDER — DULAGLUTIDE 0.75 MG/0.5ML ~~LOC~~ SOAJ: 0.7500 mg | SUBCUTANEOUS | 5 refills | Status: DC

## 2017-09-25 NOTE — Progress Notes (Addendum)
Subjective:    CC: DM, BP   HPI:  Diabetes - no hypoglycemic events. No wounds or sores that are not healing well. No increased thirst or urination. Checking glucose at home.  She is concerned that the glipizide is causing weight gain. She is very unhappy about this.    Hypertension- Pt denies chest pain, SOB, dizziness, or heart palpitations.  Taking meds as directed w/o problems.  Denies medication side effects.    Hyperlipidemia - she has been out of her statin for a couple of months.  She says she hasn't re=ordered it . No side effects.   She also wanted to let me know that her brother age 22 just passed away from liver cancer.  It was found and he then passed away about 3-4 months later.  She was told that he had a fatty liver that may have contributed.  She is concerned about herself now.  Past medical history, Surgical history, Family history not pertinant except as noted below, Social history, Allergies, and medications have been entered into the medical record, reviewed, and corrections made.   Review of Systems: No fevers, chills, night sweats, weight loss, chest pain, or shortness of breath.   Objective:    General: Well Developed, well nourished, and in no acute distress.  Neuro: Alert and oriented x3, extra-ocular muscles intact, sensation grossly intact.  HEENT: Normocephalic, atraumatic  Skin: Warm and dry, no rashes. Cardiac: Regular rate and rhythm, no murmurs rubs or gallops, no lower extremity edema.  Respiratory: Clear to auscultation bilaterally. Not using accessory muscles, speaking in full sentences.   Impression and Recommendations:    DM - uncontrolled. A1C up to 7.1.  We discussed discontinuing glipizide and adding Trulicity.  Showed her how to use the demonstration pen.  Number scription sent to pharmacy.  It looks like it is covered on on her insurance plan.  HTN -   Well controlled. Continue current regimen. Follow up in  3 months.   Hyperlipidemia-she  should have enough refills on her medication until May so just recommend that she call her mail order and have them shipped her the next 90-day supply.  Weight gain secondary to medication/BMI 35-we will discontinue the glipizide and switch to Trulicity.  Family history of liver cancer-she could possibly have fatty liver being that she is diabetic and obese.  Will schedule ultrasound with [facility]otic [place].

## 2017-09-26 NOTE — Addendum Note (Signed)
Addended by: Beatrice Lecher D on: 09/26/2017 07:31 AM   Modules accepted: Orders

## 2017-10-08 ENCOUNTER — Ambulatory Visit (HOSPITAL_COMMUNITY): Payer: Medicare HMO

## 2017-10-09 ENCOUNTER — Other Ambulatory Visit: Payer: Self-pay | Admitting: *Deleted

## 2017-10-09 DIAGNOSIS — E119 Type 2 diabetes mellitus without complications: Secondary | ICD-10-CM

## 2017-10-09 MED ORDER — LANCETS SUPER THIN 28G MISC: 4 refills | Status: DC

## 2017-10-09 MED ORDER — GLUCOSE BLOOD VI STRP: ORAL_STRIP | 4 refills | Status: DC

## 2017-10-09 MED ORDER — TRUE METRIX AIR GLUCOSE METER DEVI: 1.0000 | Freq: Every day | 0 refills | Status: DC

## 2017-10-10 ENCOUNTER — Other Ambulatory Visit: Payer: Self-pay | Admitting: *Deleted

## 2017-10-10 DIAGNOSIS — E119 Type 2 diabetes mellitus without complications: Secondary | ICD-10-CM

## 2017-10-10 MED ORDER — TRUE METRIX LEVEL 1 LOW VI SOLN: 99 refills | Status: DC

## 2017-10-10 MED ORDER — BD SWAB SINGLE USE REGULAR PADS: MEDICATED_PAD | 4 refills | Status: DC

## 2017-10-16 ENCOUNTER — Ambulatory Visit (HOSPITAL_COMMUNITY)
Admission: RE | Admit: 2017-10-16 | Discharge: 2017-10-16 | Disposition: A | Discharge status: Home / Self Care | Payer: Medicare HMO | Source: Ambulatory Visit | Attending: Family Medicine | Admitting: Family Medicine

## 2017-10-16 DIAGNOSIS — K769 Liver disease, unspecified: Secondary | ICD-10-CM | POA: Insufficient documentation

## 2017-10-16 DIAGNOSIS — Z9049 Acquired absence of other specified parts of digestive tract: Secondary | ICD-10-CM | POA: Diagnosis not present

## 2017-10-16 DIAGNOSIS — Z800 Family history of malignant neoplasm of digestive organs: Secondary | ICD-10-CM | POA: Insufficient documentation

## 2017-10-17 ENCOUNTER — Telehealth: Payer: Self-pay

## 2017-10-17 NOTE — Telephone Encounter (Signed)
Patient given result for both today.

## 2017-10-17 NOTE — Telephone Encounter (Signed)
Jillian Huynh is calling for lab results from November and [place] results from yesterday.

## 2017-10-22 ENCOUNTER — Encounter: Payer: Self-pay | Admitting: *Deleted

## 2017-10-22 ENCOUNTER — Other Ambulatory Visit: Payer: Self-pay | Admitting: *Deleted

## 2017-10-22 DIAGNOSIS — K7400 Hepatic fibrosis, unspecified: Secondary | ICD-10-CM

## 2017-11-20 DIAGNOSIS — I10 Essential (primary) hypertension: Secondary | ICD-10-CM | POA: Diagnosis not present

## 2017-11-20 DIAGNOSIS — Z1211 Encounter for screening for malignant neoplasm of colon: Secondary | ICD-10-CM | POA: Diagnosis not present

## 2017-11-20 DIAGNOSIS — K760 Fatty (change of) liver, not elsewhere classified: Secondary | ICD-10-CM | POA: Diagnosis not present

## 2017-11-20 DIAGNOSIS — K5900 Constipation, unspecified: Secondary | ICD-10-CM | POA: Diagnosis not present

## 2017-12-25 ENCOUNTER — Ambulatory Visit (INDEPENDENT_AMBULATORY_CARE_PROVIDER_SITE_OTHER): Payer: Medicare HMO | Admitting: Family Medicine

## 2017-12-25 ENCOUNTER — Telehealth: Payer: Self-pay

## 2017-12-25 ENCOUNTER — Encounter: Payer: Self-pay | Admitting: Family Medicine

## 2017-12-25 VITALS — BP 135/70 | HR 68 | Ht 63.0 in | Wt 204.0 lb

## 2017-12-25 DIAGNOSIS — Z23 Encounter for immunization: Secondary | ICD-10-CM | POA: Diagnosis not present

## 2017-12-25 DIAGNOSIS — E785 Hyperlipidemia, unspecified: Secondary | ICD-10-CM | POA: Diagnosis not present

## 2017-12-25 DIAGNOSIS — E119 Type 2 diabetes mellitus without complications: Secondary | ICD-10-CM

## 2017-12-25 DIAGNOSIS — I10 Essential (primary) hypertension: Secondary | ICD-10-CM | POA: Diagnosis not present

## 2017-12-25 DIAGNOSIS — K760 Fatty (change of) liver, not elsewhere classified: Secondary | ICD-10-CM

## 2017-12-25 LAB — POCT GLYCOSYLATED HEMOGLOBIN (HGB A1C): Hemoglobin A1C: 7.4

## 2017-12-25 MED ORDER — METFORMIN HCL ER 500 MG PO TB24: 1000.0000 mg | ORAL_TABLET | Freq: Every day | ORAL | 1 refills | Status: DC

## 2017-12-25 MED ORDER — ROPINIROLE HCL 3 MG PO TABS: 3.0000 mg | ORAL_TABLET | Freq: Every day | ORAL | 3 refills | Status: DC

## 2017-12-25 MED ORDER — LOSARTAN POTASSIUM-HCTZ 50-12.5 MG PO TABS: 1.0000 | ORAL_TABLET | Freq: Every day | ORAL | 1 refills | Status: DC

## 2017-12-25 MED ORDER — DULAGLUTIDE 0.75 MG/0.5ML ~~LOC~~ SOAJ: 0.7500 mg | SUBCUTANEOUS | 0 refills | Status: DC

## 2017-12-25 MED ORDER — ATORVASTATIN CALCIUM 10 MG PO TABS: 10.0000 mg | ORAL_TABLET | Freq: Every day | ORAL | 3 refills | Status: DC

## 2017-12-25 NOTE — Progress Notes (Signed)
Subjective:    CC: DM, BP check  HPI:  Diabetes - no hypoglycemic events. No wounds or sores that are not healing well. No increased thirst or urination. Checking glucose at home. Taking medications as prescribed without any side effects.  She initially says she never got the prescription for the Trulicity back in November.  Her last heme globin A1c was 7.1.  Hypertension- Pt denies chest pain, SOB, dizziness, or heart palpitations.  Taking meds as directed w/o problems.  Denies medication side effects.    Fatty liver-she did have a consultation with Dr. Tora Duck.  He felt like the med of your score was over estimated and felt reassured that she is just dealing with fatty liver and no early fibrosis.  But he did encourage her and stressed the importance of controlling her weight as well as her diabetes.  Hyperlipidemia-she has been out of her statin for a while and needs a new refill sent to her mail order.  Past medical history, Surgical history, Family history not pertinant except as noted below, Social history, Allergies, and medications have been entered into the medical record, reviewed, and corrections made.   Review of Systems: No fevers, chills, night sweats, weight loss, chest pain, or shortness of breath.   Objective:    General: Well Developed, well nourished, and in no acute distress.  Neuro: Alert and oriented x3, extra-ocular muscles intact, sensation grossly intact.  HEENT: Normocephalic, atraumatic  Skin: Warm and dry, no rashes. Cardiac: Regular rate and rhythm, no murmurs rubs or gallops, no lower extremity edema.  Respiratory: Clear to auscultation bilaterally. Not using accessory muscles, speaking in full sentences.   Impression and Recommendations:    DM -uncontrolled.  Heme globin A1c up to 7.4 today.  Discussed options.  I would really like to get her on Trulicity.  Now that she has a new insurance plan we will try to send over new prescription and see if it  will be covered.  I think it would help control her A1c in addition to helping curb her appetite and help her with her weight.  HTN  - Well controlled. Continue current regimen. Follow up in  3 months.    Hyperlipidemia-we will refill statin.  Plan to recheck lipids again when I see her back in 3 months.  Fatty liver-following with GI now.  Just stressed the importance again of continuing to work on healthy diet and weight loss.  And controlling her diabetes.  Declines flu vaccine today.  Prevnar 13 given today.

## 2017-12-25 NOTE — Telephone Encounter (Signed)
Jillian Huynh text her new insurance card. New mail order pharmacy has been updated in chart.

## 2017-12-30 ENCOUNTER — Encounter: Payer: Self-pay | Admitting: Family Medicine

## 2018-01-21 ENCOUNTER — Other Ambulatory Visit: Payer: Self-pay

## 2018-01-21 MED ORDER — ATORVASTATIN CALCIUM 10 MG PO TABS: 10.0000 mg | ORAL_TABLET | Freq: Every day | ORAL | 3 refills | Status: DC

## 2018-01-21 MED ORDER — ROPINIROLE HCL 3 MG PO TABS: 3.0000 mg | ORAL_TABLET | Freq: Every day | ORAL | 3 refills | Status: DC

## 2018-01-21 MED ORDER — METFORMIN HCL ER 500 MG PO TB24: 1000.0000 mg | ORAL_TABLET | Freq: Every day | ORAL | 1 refills | Status: DC

## 2018-01-21 MED ORDER — LOSARTAN POTASSIUM-HCTZ 50-12.5 MG PO TABS: 1.0000 | ORAL_TABLET | Freq: Every day | ORAL | 1 refills | Status: DC

## 2018-01-21 MED ORDER — DULAGLUTIDE 0.75 MG/0.5ML ~~LOC~~ SOAJ: 0.7500 mg | SUBCUTANEOUS | 0 refills | Status: DC

## 2018-01-21 NOTE — Telephone Encounter (Signed)
Jillian Huynh states her insurance changed and now she needs prescription sent to local pharmacy.

## 2018-03-24 ENCOUNTER — Ambulatory Visit: Payer: Medicare HMO | Admitting: Family Medicine

## 2018-03-31 ENCOUNTER — Telehealth: Payer: Self-pay

## 2018-03-31 NOTE — Telephone Encounter (Signed)
Okay to send new prescription for losartan HCTZ 100/25-half tab daily

## 2018-03-31 NOTE — Telephone Encounter (Signed)
[facility] called and states the Losartan-HCTZ 50/12.5mg  is on back order. They recommend Losartan-HCTZ 100/25mg  and to take half. Please advise.

## 2018-04-01 MED ORDER — LOSARTAN POTASSIUM-HCTZ 100-25 MG PO TABS: 0.5000 | ORAL_TABLET | Freq: Every day | ORAL | 1 refills | Status: DC

## 2018-04-01 NOTE — Telephone Encounter (Signed)
Medication sent.

## 2018-04-04 ENCOUNTER — Ambulatory Visit (INDEPENDENT_AMBULATORY_CARE_PROVIDER_SITE_OTHER): Payer: Medicare HMO | Admitting: Family Medicine

## 2018-04-04 ENCOUNTER — Encounter: Payer: Self-pay | Admitting: Family Medicine

## 2018-04-04 VITALS — BP 138/59 | HR 77 | Ht 63.0 in | Wt 204.0 lb

## 2018-04-04 DIAGNOSIS — I10 Essential (primary) hypertension: Secondary | ICD-10-CM | POA: Diagnosis not present

## 2018-04-04 DIAGNOSIS — G2581 Restless legs syndrome: Secondary | ICD-10-CM

## 2018-04-04 DIAGNOSIS — E119 Type 2 diabetes mellitus without complications: Secondary | ICD-10-CM | POA: Diagnosis not present

## 2018-04-04 DIAGNOSIS — E1121 Type 2 diabetes mellitus with diabetic nephropathy: Secondary | ICD-10-CM | POA: Diagnosis not present

## 2018-04-04 LAB — POCT GLYCOSYLATED HEMOGLOBIN (HGB A1C): HEMOGLOBIN A1C: 7.6 % — AB (ref 4.0–5.6)

## 2018-04-04 LAB — BASIC METABOLIC PANEL WITH GFR
BUN: 12 mg/dL (ref 7–25)
CHLORIDE: 102 mmol/L (ref 98–110)
CO2: 24 mmol/L (ref 20–32)
CREATININE: 0.6 mg/dL (ref 0.60–0.93)
Calcium: 9.9 mg/dL (ref 8.6–10.4)
GFR, EST AFRICAN AMERICAN: 106 mL/min/{1.73_m2} (ref >=60)
GFR, Est Non African American: 92 mL/min/{1.73_m2} (ref >=60)
GLUCOSE: 132 mg/dL — AB (ref 65–99)
Potassium: 4.5 mmol/L (ref 3.5–5.3)
SODIUM: 137 mmol/L (ref 135–146)

## 2018-04-04 MED ORDER — GLUCOSE BLOOD VI STRP: ORAL_STRIP | 4 refills | Status: DC

## 2018-04-04 MED ORDER — EXENATIDE ER 2 MG ~~LOC~~ PEN: 2.0000 mg | PEN_INJECTOR | SUBCUTANEOUS | 5 refills | Status: DC

## 2018-04-04 NOTE — Progress Notes (Signed)
Subjective:    CC: DM  HPI:  Diabetes - no hypoglycemic events. No wounds or sores that are not healing well. No increased thirst or urination. Checking glucose at home. Taking medications as prescribed without any side effects.  She feels like the trulicity is making her feel sluggish. Says it has caused diarrhea initially and now constipate.    Hypertension- Pt denies chest pain, SOB, dizziness, or heart palpitations.  Taking meds as directed w/o problems.  Denies medication side effects.   Lab Results  Component Value Date   HGBA1C 7.6 (A) 04/04/2018     Chemistry      Component Value Date/Time   NA 137 09/25/2017 1032   NA 139 09/01/2014   K 4.2 09/25/2017 1032   CL 101 09/25/2017 1032   CO2 27 09/25/2017 1032   BUN 20 09/25/2017 1032   CREATININE 0.60 09/25/2017 1032   GLU 135 09/01/2014      Component Value Date/Time   CALCIUM 10.1 09/25/2017 1032   CALCIUM 9.5 09/01/2014   ALKPHOS 78 09/19/2016 1057   AST 15 09/25/2017 1032   ALT 16 09/25/2017 1032   BILITOT 0.6 09/25/2017 1032     F/U restless leg syndrome-currently on ropinirole.  Her symptoms have actually been really well controlled since we went up on her dose and she is very happy with her regimen.   Past medical history, Surgical history, Family history not pertinant except as noted below, Social history, Allergies, and medications have been entered into the medical record, reviewed, and corrections made.   Review of Systems: No fevers, chills, night sweats, weight loss, chest pain, or shortness of breath.   Objective:    General: Well Developed, well nourished, and in no acute distress.  Neuro: Alert and oriented x3, extra-ocular muscles intact, sensation grossly intact.  HEENT: Normocephalic, atraumatic  Skin: Warm and dry, no rashes. Cardiac: Regular rate and rhythm, no murmurs rubs or gallops, no lower extremity edema.  Respiratory: Clear to auscultation bilaterally. Not using accessory muscles,  speaking in full sentences.   Impression and Recommendations:    DM -controlled.  Heme globin A1c up to 7.6, actually went up from  previous.  Even though she has not been eating as much she is been snacking on things that are high in carbs and sugar. on statin and ARB.  Stop the medication completely for a week and let it completely clear out of her system.  We discussed options.  We will try switching her to by durian.  New prescription sent to pharmacy.  Continue with metformin.  HTN - Well controlled. Continue current regimen. Follow up in  6 months.    RLS - doing well on increased dose.   Chronic constipation -we discussed using a laxative at least initially to clear herself out.  And then may be even start with a half a capful of MiraLAX nightly or every other night to help keep her bowels moving more regularly.  And also work on increasing fiber in her diet.

## 2018-04-07 NOTE — Progress Notes (Signed)
All labs are normal. 

## 2018-05-05 ENCOUNTER — Telehealth: Payer: Self-pay

## 2018-05-05 MED ORDER — EXENATIDE ER 2 MG ~~LOC~~ PEN: 2.0000 mg | PEN_INJECTOR | SUBCUTANEOUS | 5 refills | Status: DC

## 2018-05-05 NOTE — Telephone Encounter (Signed)
Pt advised. Verbalized understanding. No further questions.  

## 2018-05-05 NOTE — Telephone Encounter (Signed)
Pt called stating that since her last OV on 04-04-18, her blood sugars have been steadily increasing. This morning her blood sugar was 250. Per pt, she was advised to stop her Trulicity because it was casing her to feel sluggish and also causing some stomach upset.  Dr Madilyn Fireman sent in [place] to [facility] order, but patient has not yet received an RX and has not heard anything from pharmacy. Per chart, we have not been contacted for PA nor any clarification on RX. Will [facility] so that pt can begin taking, and because this is the pharmacy that pt prefers. She is still taking her Metformin.   Dr Madilyn Fireman, do you advise any further action? Please advise.

## 2018-05-05 NOTE — Telephone Encounter (Signed)
I think that sounds good.  If she is able to fill it here locally and get started on it and that should help.  If she is not able to get the medication and or when she starts the medication her blood sugars are not coming down over 1 to 2 weeks and she needs to give [place] a call.  In the meantime she needs to be very strict with her diet.  And make sure she is drinking plenty of water.

## 2018-05-07 DIAGNOSIS — R69 Illness, unspecified: Secondary | ICD-10-CM | POA: Diagnosis not present

## 2018-06-02 ENCOUNTER — Telehealth: Payer: Self-pay

## 2018-06-02 MED ORDER — LIRAGLUTIDE 18 MG/3ML ~~LOC~~ SOPN: PEN_INJECTOR | SUBCUTANEOUS | 0 refills | Status: DC

## 2018-06-02 NOTE — Telephone Encounter (Signed)
Pt states she has tried Trulicity before and it made her "dealthy sick." I inquired about what that meant, she said it caused "extreme diarrhea." Will add to list.   She is OK to try Victoza.

## 2018-06-02 NOTE — Telephone Encounter (Signed)
Bydureon is causing nausea and vomiting. She has lost 7 lbs. She wants to lose weight but not due to nausea and vomiting.

## 2018-06-02 NOTE — Telephone Encounter (Signed)
Would she want to try something similar like trulicity or victiza?

## 2018-06-02 NOTE — Telephone Encounter (Signed)
Okay, new prescription sent for Victoza.

## 2018-06-05 NOTE — Telephone Encounter (Signed)
The Victoza cost $197 a month. Please advise.

## 2018-06-06 NOTE — Telephone Encounter (Signed)
She needs to call her insurance to see what is covered in that class of drugs.

## 2018-06-06 NOTE — Telephone Encounter (Signed)
Pt advised.

## 2018-07-09 ENCOUNTER — Encounter: Payer: Self-pay | Admitting: Family Medicine

## 2018-07-09 ENCOUNTER — Ambulatory Visit (INDEPENDENT_AMBULATORY_CARE_PROVIDER_SITE_OTHER): Payer: Medicare HMO | Admitting: Family Medicine

## 2018-07-09 VITALS — BP 131/47 | HR 80 | Temp 98.2°F | Wt 193.0 lb

## 2018-07-09 DIAGNOSIS — I10 Essential (primary) hypertension: Secondary | ICD-10-CM | POA: Diagnosis not present

## 2018-07-09 DIAGNOSIS — R112 Nausea with vomiting, unspecified: Secondary | ICD-10-CM | POA: Diagnosis not present

## 2018-07-09 DIAGNOSIS — T50905A Adverse effect of unspecified drugs, medicaments and biological substances, initial encounter: Secondary | ICD-10-CM | POA: Diagnosis not present

## 2018-07-09 DIAGNOSIS — R5383 Other fatigue: Secondary | ICD-10-CM

## 2018-07-09 DIAGNOSIS — E118 Type 2 diabetes mellitus with unspecified complications: Secondary | ICD-10-CM

## 2018-07-09 LAB — POCT GLYCOSYLATED HEMOGLOBIN (HGB A1C): Hemoglobin A1C: 7.6 % — AB (ref 4.0–5.6)

## 2018-07-09 NOTE — Progress Notes (Signed)
Subjective:    CC: Diabetes and hypertension. HPI:  Diabetes - no hypoglycemic events. No wounds or sores that are not healing well. No increased thirst or urination. Checking glucose at home. Taking medications as prescribed without any side effects.  Been unable to tolerate the by durian or Trulicity secondary to nausea vomiting.  Sent over new prescription for Victoza but unfortunately it was too costly.  We had encouraged her to call her insurance to see what may be covered. Her fasting is down to 140.  Arrien was last medication that she used and it did leave some hard nodules under her skin that she wants me to look at today.  She finished the medication about 2 months ago and still just does not feel great.  She is continued to have some nausea with intermittent vomiting ever since then though it is not nearly as intense.  Even started some over-the-counter store brand Prilosec and has been taking that for about 3 weeks.  She feels like that has helped may be a little.  But has not completely resolved her symptoms either.  She just feels extremely fatigued.  Did restart some of glipizide that had given her and now her fasting blood sugars are down close to 140.  Says right after she stopped the Bydureon her blood sugars have jumped up to about 200.  She admits she has not been sleeping well either.  She says that when she was intensely nauseated and vomiting it was keeping her up at night.  It is getting a little better though.  Hypertension- Pt denies chest pain, SOB, dizziness, or heart palpitations.  Taking meds as directed w/o problems.  Denies medication side effects.      Past medical history, Surgical history, Family history not pertinant except as noted below, Social history, Allergies, and medications have been entered into the medical record, reviewed, and corrections made.   Review of Systems: No fevers, chills, night sweats, weight loss, chest pain, or shortness of breath.    Objective:    General: Well Developed, well nourished, and in no acute distress.  Neuro: Alert and oriented x3, extra-ocular muscles intact, sensation grossly intact.  HEENT: Normocephalic, atraumatic  Skin: Warm and dry, no rashes. Cardiac: Regular rate and rhythm, no murmurs rubs or gallops, no lower extremity edema.  Respiratory: Clear to auscultation bilaterally. Not using accessory muscles, speaking in full sentences.   Impression and Recommendations:    DM - Well controlled. Continue current regimen. Follow up in 3 months.  For now we will continue with low-dose glipizide as well as metformin and she is going to be very careful with her dietary choices. Lab Results  Component Value Date   HGBA1C 7.6 (A) 07/09/2018    HTN - Well controlled. Continue current regimen. Follow up in 3 months.    Nausea and vomiting.  I am concerned that she still having persistent symptoms 2 months after stopping the medication.  I do think something else could be going on with her so we will check a CBC.  We will check for pancreatitis.  We discussed referring her to GI for further work-up but she wants to give it a couple more weeks and see if she just continues to feel better on her own.  Skin nodules-these are often common after Bydureon use.  Explained that they should go away in the next couple of months but can take a while.  Fatigue-we will check CBC and check for B12 deficiency.  As well as anemia and thyroid disorder.  Medication side effect-Bydureon added to intolerance list.  I think we just can stay away from this class of medications for now.

## 2018-07-10 LAB — LIPASE: LIPASE: 22 U/L (ref 7–60)

## 2018-07-10 LAB — CBC WITH DIFFERENTIAL/PLATELET
BASOS PCT: 0.9 %
Basophils Absolute: 73 cells/uL (ref 0–200)
EOS PCT: 5.6 %
Eosinophils Absolute: 454 cells/uL (ref 15–500)
HCT: 36.8 % (ref 35.0–45.0)
Hemoglobin: 12.3 g/dL (ref 11.7–15.5)
Lymphs Abs: 1976 cells/uL (ref 850–3900)
MCH: 27.3 pg (ref 27.0–33.0)
MCHC: 33.4 g/dL (ref 32.0–36.0)
MCV: 81.8 fL (ref 80.0–100.0)
MPV: 11.1 fL (ref 7.5–12.5)
Monocytes Relative: 5.6 %
Neutro Abs: 5144 cells/uL (ref 1500–7800)
Neutrophils Relative %: 63.5 %
PLATELETS: 285 10*3/uL (ref 140–400)
RBC: 4.5 10*6/uL (ref 3.80–5.10)
RDW: 13.8 % (ref 11.0–15.0)
TOTAL LYMPHOCYTE: 24.4 %
WBC: 8.1 10*3/uL (ref 3.8–10.8)
WBCMIX: 454 {cells}/uL (ref 200–950)

## 2018-07-10 LAB — COMPLETE METABOLIC PANEL WITH GFR
AG RATIO: 2 (calc) (ref 1.0–2.5)
ALT: 35 U/L — ABNORMAL HIGH (ref 6–29)
AST: 25 U/L (ref 10–35)
Albumin: 4.4 g/dL (ref 3.6–5.1)
Alkaline phosphatase (APISO): 65 U/L (ref 33–130)
BUN: 18 mg/dL (ref 7–25)
CALCIUM: 10.4 mg/dL (ref 8.6–10.4)
CO2: 26 mmol/L (ref 20–32)
CREATININE: 0.72 mg/dL (ref 0.60–0.93)
Chloride: 101 mmol/L (ref 98–110)
GFR, EST AFRICAN AMERICAN: 98 mL/min/{1.73_m2} (ref >=60)
GFR, EST NON AFRICAN AMERICAN: 84 mL/min/{1.73_m2} (ref >=60)
GLUCOSE: 100 mg/dL — AB (ref 65–99)
Globulin: 2.2 g/dL (calc) (ref 1.9–3.7)
Potassium: 4.2 mmol/L (ref 3.5–5.3)
Sodium: 138 mmol/L (ref 135–146)
TOTAL PROTEIN: 6.6 g/dL (ref 6.1–8.1)
Total Bilirubin: 0.7 mg/dL (ref 0.2–1.2)

## 2018-07-10 LAB — TSH: TSH: 1.99 m[IU]/L (ref 0.40–4.50)

## 2018-07-10 LAB — VITAMIN B12: Vitamin B-12: 522 pg/mL (ref 200–1100)

## 2018-07-10 LAB — AMYLASE: Amylase: 35 U/L (ref 21–101)

## 2018-07-11 ENCOUNTER — Other Ambulatory Visit: Payer: Self-pay | Admitting: *Deleted

## 2018-07-11 DIAGNOSIS — R748 Abnormal levels of other serum enzymes: Secondary | ICD-10-CM

## 2018-07-27 ENCOUNTER — Other Ambulatory Visit: Payer: Self-pay | Admitting: Family Medicine

## 2018-09-08 ENCOUNTER — Telehealth: Payer: Self-pay

## 2018-09-08 MED ORDER — GLIPIZIDE 10 MG PO TABS: 10.0000 mg | ORAL_TABLET | Freq: Every day | ORAL | 0 refills | Status: DC

## 2018-09-08 NOTE — Telephone Encounter (Signed)
Pt calls- worried because her sugars are running around 200s.   Pt takes only metformin XR 500 mg BD and Glipizide 5mg  1 QAM (leftover RX)   Reports that fasting AM reading is about 200 then during the day. Rechecks sugar an hour after meds and it come down by about 40 points.   Pt worried and concerned about what is a good next step for her.

## 2018-09-08 NOTE — Telephone Encounter (Signed)
OK to increase glipizide to 10mg  daily and follow for a couple of days.  Make sure only drinking water. No sugary beverages or artificial sweetners.  We can send a rx if needed.

## 2018-09-08 NOTE — Telephone Encounter (Signed)
Pt advised of recommendations. I have sent 30 day supply of Glipizide 10 mg tabs for pt. She will cut out sugary drinks and will call back with readings in about a week

## 2018-09-17 ENCOUNTER — Other Ambulatory Visit: Payer: Self-pay | Admitting: Family Medicine

## 2018-09-17 DIAGNOSIS — Z1231 Encounter for screening mammogram for malignant neoplasm of breast: Secondary | ICD-10-CM

## 2018-09-17 DIAGNOSIS — R748 Abnormal levels of other serum enzymes: Secondary | ICD-10-CM | POA: Diagnosis not present

## 2018-09-18 LAB — COMPLETE METABOLIC PANEL WITH GFR
AG RATIO: 2 (calc) (ref 1.0–2.5)
ALBUMIN MSPROF: 4.2 g/dL (ref 3.6–5.1)
ALT: 31 U/L — ABNORMAL HIGH (ref 6–29)
AST: 23 U/L (ref 10–35)
Alkaline phosphatase (APISO): 101 U/L (ref 33–130)
BILIRUBIN TOTAL: 0.6 mg/dL (ref 0.2–1.2)
BUN: 14 mg/dL (ref 7–25)
CALCIUM: 9.6 mg/dL (ref 8.6–10.4)
CHLORIDE: 104 mmol/L (ref 98–110)
CO2: 26 mmol/L (ref 20–32)
Creat: 0.68 mg/dL (ref 0.60–0.93)
GFR, EST AFRICAN AMERICAN: 102 mL/min/{1.73_m2} (ref >=60)
GFR, EST NON AFRICAN AMERICAN: 88 mL/min/{1.73_m2} (ref >=60)
Globulin: 2.1 g/dL (calc) (ref 1.9–3.7)
Glucose, Bld: 116 mg/dL — ABNORMAL HIGH (ref 65–99)
Potassium: 4.1 mmol/L (ref 3.5–5.3)
Sodium: 139 mmol/L (ref 135–146)
TOTAL PROTEIN: 6.3 g/dL (ref 6.1–8.1)

## 2018-09-24 ENCOUNTER — Ambulatory Visit (INDEPENDENT_AMBULATORY_CARE_PROVIDER_SITE_OTHER): Payer: Medicare HMO

## 2018-09-24 DIAGNOSIS — Z1231 Encounter for screening mammogram for malignant neoplasm of breast: Secondary | ICD-10-CM | POA: Diagnosis not present

## 2018-10-06 ENCOUNTER — Other Ambulatory Visit: Payer: Self-pay | Admitting: Family Medicine

## 2018-10-08 ENCOUNTER — Encounter: Payer: Self-pay | Admitting: Family Medicine

## 2018-10-08 ENCOUNTER — Ambulatory Visit (INDEPENDENT_AMBULATORY_CARE_PROVIDER_SITE_OTHER): Payer: Medicare HMO | Admitting: Family Medicine

## 2018-10-08 VITALS — BP 139/72 | HR 66 | Ht 62.0 in | Wt 201.0 lb

## 2018-10-08 DIAGNOSIS — R112 Nausea with vomiting, unspecified: Secondary | ICD-10-CM | POA: Diagnosis not present

## 2018-10-08 DIAGNOSIS — I10 Essential (primary) hypertension: Secondary | ICD-10-CM

## 2018-10-08 DIAGNOSIS — J40 Bronchitis, not specified as acute or chronic: Secondary | ICD-10-CM

## 2018-10-08 DIAGNOSIS — J329 Chronic sinusitis, unspecified: Secondary | ICD-10-CM | POA: Diagnosis not present

## 2018-10-08 DIAGNOSIS — E119 Type 2 diabetes mellitus without complications: Secondary | ICD-10-CM | POA: Diagnosis not present

## 2018-10-08 DIAGNOSIS — J040 Acute laryngitis: Secondary | ICD-10-CM

## 2018-10-08 DIAGNOSIS — E118 Type 2 diabetes mellitus with unspecified complications: Secondary | ICD-10-CM

## 2018-10-08 LAB — POCT GLYCOSYLATED HEMOGLOBIN (HGB A1C): Hemoglobin A1C: 8.5 % — AB (ref 4.0–5.6)

## 2018-10-08 MED ORDER — FLUCONAZOLE 150 MG PO TABS: 150.0000 mg | ORAL_TABLET | Freq: Once | ORAL | 0 refills | Status: AC

## 2018-10-08 MED ORDER — DAPAGLIFLOZIN PROPANEDIOL 5 MG PO TABS: 5.0000 mg | ORAL_TABLET | Freq: Every day | ORAL | 2 refills | Status: DC

## 2018-10-08 MED ORDER — AZITHROMYCIN 250 MG PO TABS: ORAL_TABLET | ORAL | 0 refills | Status: AC

## 2018-10-08 NOTE — Progress Notes (Signed)
Subjective:    CC: DM  HPI:  Diabetes - no hypoglycemic events. No wounds or sores that are not healing well. No increased thirst or urination. Checking glucose at home. Taking medications as prescribed without any side effects.  Is unable to tolerate Trulicity or Bydureon.  Hypertension- Pt denies chest pain, SOB, dizziness, or heart palpitations.  Taking meds as directed w/o problems.  Denies medication side effects.    Having some problems with nausea and vomiting and felt like it was secondary to medication side effect but she is actually been off the medication for several months now. She wanted to hold off on referral to GI.  Nausea and vomiting have resolved but now she is been eating too much and gained a lot of weight.  She reports she is actually been stressed eating and not doing a good job at all with her food choices which she knows she needs to change.  For 3 days she has had cough productive with some laryngitis.  No fever, sweats or chills, taking vicks.  She is concerned because last year around this time she started with similar symptoms and then ended up having pneumonia and it took quite a while for her to get better.  She does feel tired.  Some sinus pressure.    Past medical history, Surgical history, Family history not pertinant except as noted below, Social history, Allergies, and medications have been entered into the medical record, reviewed, and corrections made.   Review of Systems: No fevers, chills, night sweats, weight loss, chest pain, or shortness of breath.   Objective:    General: Well Developed, well nourished, and in no acute distress.  Neuro: Ale rt and oriented x3, extra-ocular muscles intact, sensation grossly intact.  HEENT: Normocephalic, atraumatic, OP is clear, TMs and canals are clear bilaterally.  NO cervical lymphadenopathy.    Skin: Warm and dry, no rashes. Cardiac: Regular rate and rhythm, no murmurs rubs or gallops, no lower extremity edema.   Respiratory: Clear to auscultation bilaterally. Not using accessory muscles, speaking in full sentences.   Impression and Recommendations:    DM - Uncontrolled. A1C of 8.5 today.  Change current regimen. Follow up in  3-4 months.  Continue with metformin.  Discussed options.  I like to try [place].  Will monitor for yeast infections as she had that problem with Invokana.  I did give her prescription for Diflucan just in case.  Just encouraged her to get back on track with her diet and exercise and work on losing weight.  I will see her back in 3 months.  HTN - Well controlled. Continue current regimen. Follow up in  3-4 months.    Sinobronchitis/laryngitis  -possibly viral that she is only had symptoms for about 3 days at this point.  But since we are actually going to be closed for a long holiday weekend we will not be open for the next 5 consecutive days and to go ahead and give her a prescription for azithromycin to take if she feels that she is getting worse.  Patient agrees to wait and give this a little bit more time to see if she starts to feel better and improves on her own.

## 2018-10-08 NOTE — Patient Instructions (Signed)
Okay to cut the glipizide in half. Start the [place]. If at any point you feel you are getting a yeast infection then go ahead and take the Diflucan. Okay to fill the antibiotic over the weekend if you are feeling like you are getting worse.

## 2018-11-24 ENCOUNTER — Telehealth: Payer: Self-pay

## 2018-11-24 NOTE — Telephone Encounter (Signed)
Please just verify that she is taking that with the metformin.  Plus she is on the low dose of Wilder Glade so if she is tolerating it well so far we can actually try increasing her to 10 mg and see if she feels like this is working better.

## 2018-11-24 NOTE — Telephone Encounter (Signed)
Kessler states the Wilder Glade is not controlling her blood glucose it is running in the 200. She would like a different medication.

## 2018-11-25 ENCOUNTER — Encounter: Payer: Self-pay | Admitting: Physician Assistant

## 2018-11-25 ENCOUNTER — Ambulatory Visit (INDEPENDENT_AMBULATORY_CARE_PROVIDER_SITE_OTHER): Payer: Medicare HMO | Admitting: Physician Assistant

## 2018-11-25 VITALS — BP 115/49 | HR 78 | Ht 62.0 in | Wt 199.0 lb

## 2018-11-25 DIAGNOSIS — R3915 Urgency of urination: Secondary | ICD-10-CM | POA: Diagnosis not present

## 2018-11-25 DIAGNOSIS — E1165 Type 2 diabetes mellitus with hyperglycemia: Secondary | ICD-10-CM

## 2018-11-25 DIAGNOSIS — R350 Frequency of micturition: Secondary | ICD-10-CM

## 2018-11-25 LAB — POCT URINALYSIS DIPSTICK
BILIRUBIN UA: NEGATIVE
Blood, UA: NEGATIVE
GLUCOSE UA: POSITIVE — AB
Leukocytes, UA: NEGATIVE
Nitrite, UA: NEGATIVE
Protein, UA: NEGATIVE
Spec Grav, UA: 1.015 (ref 1.010–1.025)
Urobilinogen, UA: 0.2 E.U./dL
pH, UA: 5 (ref 5.0–8.0)

## 2018-11-25 MED ORDER — PIOGLITAZONE HCL 15 MG PO TABS: 15.0000 mg | ORAL_TABLET | Freq: Every day | ORAL | 1 refills | Status: DC

## 2018-11-25 NOTE — Progress Notes (Deleted)
   Subjective:    Patient ID: Jillian Huynh, female    DOB: 02-13-1946, 73 y.o.   MRN: [id number]  HPI 2 months. Started a immediately   5-6 times a day. Urgency and leakage.   Sugars running 200.   Medication metformin.  trulicy- diarrhea.  Glipizide-weight gain.  faraxgia- increase in urination.    Review of Systems     Objective:   Physical Exam        Assessment & Plan:

## 2018-11-25 NOTE — Telephone Encounter (Signed)
With that particular medication she will pee more if she has more sugar in her bloodstream.  So she eats something sugary or high in carbs which gets converted into sugar she will actually pee more and have more urgency.  She could also have a bladder infection though that could be causing her symptoms so she really should make an appointment so that we can evaluate her for UTI.

## 2018-11-25 NOTE — Patient Instructions (Signed)
Stop faraxiga.  Start actos.  Keep monitoring sugars.

## 2018-11-25 NOTE — Progress Notes (Signed)
Subjective:    Patient ID: Jillian Huynh, female    DOB: 1946/01/22, 73 y.o.   MRN: [id number]  HPI: This is a 73 year old female who presents today with a chief complaint of urinary urgency. She began [place] one month ago and the symptoms began the day after. The usually urinates 3-4 times per day and since starting the [place] she urinates 6+ times per day and feels as if she will not make it to the bathroom in time. She also states that her blood sugars have been in the 200s each morning when she wakes up. She has tried multiple diabetes medications before this and has not tolerated them well due to side effects.   Side effects to date: Bydureon - Nausea and vomiting Trulicity - Diarrhea Invokana - Yeast infection  Glipizide - Stomach cramping Farxiga - Urinary urgency  She denies any vaginal itching, burning, abdominal pain, diarrhea.   Review of Systems  Constitutional: Negative for activity change, appetite change, fatigue and fever.  Respiratory: Negative for cough, chest tightness and shortness of breath.   Cardiovascular: Negative for chest pain and leg swelling.  Gastrointestinal: Negative for diarrhea, nausea and vomiting.  Genitourinary: Positive for urgency. Negative for decreased urine volume and vaginal discharge.  Skin: Negative for color change, rash and wound.  Psychiatric/Behavioral: Negative for agitation and decreased concentration.       Objective:   Physical Exam Vitals signs reviewed.  Constitutional:      General: She is not in acute distress.    Appearance: She is not ill-appearing.  HENT:     Head: Normocephalic and atraumatic.     Right Ear: External ear normal.     Left Ear: External ear normal.     Nose: Nose normal. No congestion or rhinorrhea.     Mouth/Throat:     Mouth: Mucous membranes are moist.     Pharynx: Oropharynx is clear. No oropharyngeal exudate or posterior oropharyngeal erythema.  Eyes:     General: No scleral icterus.     Right eye: No discharge.        Left eye: No discharge.     Extraocular Movements: Extraocular movements intact.     Conjunctiva/sclera: Conjunctivae normal.     Pupils: Pupils are equal, round, and reactive to light.  Neck:     Musculoskeletal: Normal range of motion and neck supple. No neck rigidity or muscular tenderness.  Cardiovascular:     Rate and Rhythm: Normal rate.     Pulses: Normal pulses.     Heart sounds: Normal heart sounds.  Pulmonary:     Effort: Pulmonary effort is normal. No respiratory distress.     Breath sounds: Normal breath sounds. No wheezing, rhonchi or rales.  Abdominal:     General: There is no distension.  Lymphadenopathy:     Cervical: No cervical adenopathy.  Skin:    General: Skin is warm and dry.     Capillary Refill: Capillary refill takes less than 2 seconds.     Coloration: Skin is not jaundiced.     Findings: No bruising or rash.  Neurological:     Mental Status: She is alert.     Coordination: Coordination normal.     Gait: Gait normal.  Psychiatric:        Mood and Affect: Mood normal.        Behavior: Behavior normal.        Thought Content: Thought content normal.  Judgment: Judgment normal.           Assessment & Plan:  Jillian Huynh KitchenMarland KitchenBethel was seen today for urinary tract infection.  Diagnoses and all orders for this visit:  Uncontrolled type 2 diabetes mellitus with hyperglycemia ([place]) -     pioglitazone (ACTOS) 15 MG tablet; Take 1 tablet (15 mg total) by mouth daily.  Urinary frequency -     POCT Urinalysis Dipstick -     Urine Culture  Urinary urgency -     POCT Urinalysis Dipstick -     Urine Culture  .Jillian Huynh Kitchen Results for orders placed or performed in visit on 11/25/18  POCT Urinalysis Dipstick  Result Value Ref Range   Color, UA yellow    Clarity, UA clear    Glucose, UA Positive (A) Negative   Bilirubin, UA negative    Ketones, UA trace    Spec Grav, UA 1.015 1.010 - 1.025   Blood, UA negative    pH, UA 5.0 5.0 -  8.0   Protein, UA Negative Negative   Urobilinogen, UA 0.2 0.2 or 1.0 E.U./dL   Nitrite, UA negative    Leukocytes, UA Negative Negative   Appearance     Odor     Positive glucose as expected no signs of infection on dipstick. Will culture to confirm no infection.   It sounds like symptoms start just after stopping farxiga.  -Stop Wilder Glade -continue to watch sugar and carbs in diet.  -Start Actos 15mg  PO QD with metformin.  Discussed with patient this is low dose so may need to increase should her sugars not come down. Also discussed possible side effects such as swelling and instructed patient to call should she experience intense side effects. Follow up in one month scheduled with Dr. Madilyn Fireman.  Jillian Huynh KitchenVernetta Honey PA-C, have reviewed and agree with the above documentation in it's entirety.

## 2018-11-25 NOTE — Telephone Encounter (Signed)
She is still taking the metformin. She states since she has been on [place] she has had urinary urgency. Sometimes she has not been able to make it to the restroom. She has urinated on herself a few times. Please advise.

## 2018-11-25 NOTE — Telephone Encounter (Signed)
Patient scheduled.

## 2018-11-26 LAB — URINE CULTURE
MICRO NUMBER: 54380
SPECIMEN QUALITY:: ADEQUATE

## 2018-11-27 NOTE — Progress Notes (Signed)
Call pt: negative for any bacterial infection that needs to be treated.

## 2018-12-18 ENCOUNTER — Other Ambulatory Visit: Payer: Self-pay | Admitting: Physician Assistant

## 2018-12-18 DIAGNOSIS — E1165 Type 2 diabetes mellitus with hyperglycemia: Secondary | ICD-10-CM

## 2018-12-19 ENCOUNTER — Other Ambulatory Visit: Payer: Self-pay | Admitting: *Deleted

## 2019-01-07 ENCOUNTER — Ambulatory Visit (INDEPENDENT_AMBULATORY_CARE_PROVIDER_SITE_OTHER): Payer: Medicare HMO | Admitting: Family Medicine

## 2019-01-07 ENCOUNTER — Encounter: Payer: Self-pay | Admitting: Family Medicine

## 2019-01-07 VITALS — BP 148/78 | HR 70 | Ht 62.0 in | Wt 208.0 lb

## 2019-01-07 DIAGNOSIS — I10 Essential (primary) hypertension: Secondary | ICD-10-CM | POA: Diagnosis not present

## 2019-01-07 DIAGNOSIS — R600 Localized edema: Secondary | ICD-10-CM | POA: Diagnosis not present

## 2019-01-07 DIAGNOSIS — E1165 Type 2 diabetes mellitus with hyperglycemia: Secondary | ICD-10-CM | POA: Diagnosis not present

## 2019-01-07 LAB — POCT GLYCOSYLATED HEMOGLOBIN (HGB A1C): Hemoglobin A1C: 9.5 % — AB (ref 4.0–5.6)

## 2019-01-07 MED ORDER — INSULIN DEGLUDEC 100 UNIT/ML ~~LOC~~ SOPN: 10.0000 [IU] | PEN_INJECTOR | Freq: Every day | SUBCUTANEOUS | 1 refills | Status: DC

## 2019-01-07 MED ORDER — PIOGLITAZONE HCL 30 MG PO TABS: 30.0000 mg | ORAL_TABLET | Freq: Every day | ORAL | 2 refills | Status: DC

## 2019-01-07 MED ORDER — INSULIN PEN NEEDLE 33G X 4 MM MISC: 1.0000 | Freq: Every day | 1 refills | Status: DC

## 2019-01-07 NOTE — Progress Notes (Signed)
insuSubjective:    CC: uncontrolled sugars.   HPI:  Diabetes - no hypoglycemic events. No wounds or sores that are not healing well. No increased thirst or urination. Checking glucose at home. Taking medications as prescribed without any side effects. She stopped the Farxiga bc was causing urinary urgency.  AM sugars are running 180s.  She is now on Actos and metformin.    She has gained 9 lbs since here 6 weeks ago.  She thinks she has gained 2 lbs in the last 2 days.  She had restarted her old glipizide script because her sugars were still running > 200.  She feels more swollne.    Hypertension- Pt denies chest pain, SOB, dizziness, or heart palpitations.  Taking meds as directed w/o problems.  Denies medication side effects.      Past medical history, Surgical history, Family history not pertinant except as noted below, Social history, Allergies, and medications have been entered into the medical record, reviewed, and corrections made.   Review of Systems: No fevers, chills, night sweats, weight loss, chest pain, or shortness of breath.   Objective:    General: Well Developed, well nourished, and in no acute distress.  Neuro: Alert and oriented x3, extra-ocular muscles intact, sensation grossly intact.  HEENT: Normocephalic, atraumatic  Skin: Warm and dry, no rashes.  Cardiac: Regular rate and rhythm, no murmurs rubs or gallops, no lower extremity edema.  Respiratory: Clear to auscultation bilaterally. Not using accessory muscles, speaking in full sentences. MSK: she has trace ankle edema bilaterally.    Impression and Recommendations:   DM - Uncontrolled. A1C is 9.5 today.  Discussed optoins.  Will increase Actos and hav her stop the side which tends to cause robust weight gain in her.  Plus the combination is probably causing some edema.  If she still has edema after stopping the glipizide that it may be related to the Actos.  We also discussed starting long-acting insulin.  I  demonstrated how to use an insulin pen here in the office to get her more comfortable with it.  Prescription sent for [place].  She will start with 10 units at bedtime then increase by 1 unit every other day until her fasting blood sugars are under 130.  Follow-up in 6 weeks instead of 3 months.  Lower extremity edema - have her inc her HCTZ to 25mg  daily for a couple of days.    Hypertension-patient is upset today so blood pressure is a little elevated it looked great 6 weeks ago.  Plus I think she is got some fluid retention which could be contributing so hopefully this will come down in the next few days.

## 2019-01-07 NOTE — Patient Instructions (Addendum)
Stop the glipizide.   Increase your hydrochlorothiazide to 2 tabs daily for the next couple of days to try to get some extra water weight off. K to increase her Actos to 30 mg.  But if you feel like your swelling is not improving then please let me know. Start the [place] with 10 units at bedtime each night. Ok to increase by one unit every 2 days until morning blood sugar is under 130. Then stay at that dose.

## 2019-01-22 ENCOUNTER — Other Ambulatory Visit: Payer: Self-pay | Admitting: Family Medicine

## 2019-01-23 ENCOUNTER — Other Ambulatory Visit: Payer: Self-pay | Admitting: Family Medicine

## 2019-02-11 DIAGNOSIS — I10 Essential (primary) hypertension: Secondary | ICD-10-CM | POA: Diagnosis not present

## 2019-02-11 DIAGNOSIS — E1165 Type 2 diabetes mellitus with hyperglycemia: Secondary | ICD-10-CM | POA: Diagnosis not present

## 2019-02-12 LAB — BASIC METABOLIC PANEL WITH GFR
BUN: 17 mg/dL (ref 7–25)
CO2: 23 mmol/L (ref 20–32)
Calcium: 9.7 mg/dL (ref 8.6–10.4)
Chloride: 103 mmol/L (ref 98–110)
Creat: 0.75 mg/dL (ref 0.60–0.93)
GFR, Est African American: 92 mL/min/{1.73_m2} (ref >=60)
GFR, Est Non African American: 80 mL/min/{1.73_m2} (ref >=60)
GLUCOSE: 198 mg/dL — AB (ref 65–99)
Potassium: 4.1 mmol/L (ref 3.5–5.3)
Sodium: 136 mmol/L (ref 135–146)

## 2019-02-12 NOTE — Progress Notes (Signed)
All labs are normal. 

## 2019-02-16 ENCOUNTER — Other Ambulatory Visit: Payer: Self-pay | Admitting: Family Medicine

## 2019-02-18 ENCOUNTER — Telehealth: Payer: Self-pay | Admitting: *Deleted

## 2019-02-18 ENCOUNTER — Other Ambulatory Visit: Payer: Self-pay

## 2019-02-18 ENCOUNTER — Ambulatory Visit: Payer: Medicare HMO | Admitting: Family Medicine

## 2019-02-18 ENCOUNTER — Encounter: Payer: Self-pay | Admitting: Family Medicine

## 2019-02-18 ENCOUNTER — Other Ambulatory Visit: Payer: Self-pay | Admitting: Family Medicine

## 2019-02-18 ENCOUNTER — Ambulatory Visit (INDEPENDENT_AMBULATORY_CARE_PROVIDER_SITE_OTHER): Payer: Medicare HMO | Admitting: Family Medicine

## 2019-02-18 VITALS — Ht 62.0 in

## 2019-02-18 DIAGNOSIS — I10 Essential (primary) hypertension: Secondary | ICD-10-CM

## 2019-02-18 DIAGNOSIS — R635 Abnormal weight gain: Secondary | ICD-10-CM

## 2019-02-18 DIAGNOSIS — E1165 Type 2 diabetes mellitus with hyperglycemia: Secondary | ICD-10-CM

## 2019-02-18 MED ORDER — ROPINIROLE HCL 3 MG PO TABS: 3.0000 mg | ORAL_TABLET | Freq: Every day | ORAL | 3 refills | Status: DC

## 2019-02-18 NOTE — Progress Notes (Signed)
Virtual Visit via Telephone Note  I connected with Jillian Huynh on 02/18/19 at 10:30 AM EDT by telephone and verified that I am speaking with the correct person using two identifiers.   I discussed the limitations, risks, security and privacy concerns of performing an evaluation and management service by telephone and the availability of in person appointments. I also discussed with the patient that there may be a patient responsible charge related to this service. The patient expressed understanding and agreed to proceed.   Subjective:    CC: diabetic check up   HPI:  F/U DM - Pt has not start the insulin because she is scared to do the shot. She stated that she has been watching what she eats more. She has been taking the Actos 30 mg and she had some of the Actos15 mg left over and she has been taking that also (total 45 mg). Her BS have been around 175 in the mornings. She stated that she has lost about 5 lbs.    She has been home for 2 weeks. She is getting bored.  But she has trying to stay in and stay safe.  She has not had any cough or fever.   HTN f/u - She reports that she has been swelling in her hands and feet over the past week and she realized that she hasn't been taking her hctz. She said that during this time she has been moving and everything is packed away.   She had initially screened positive for depression last fall. She is doing well.   Past medical history, Surgical history, Family history not pertinant except as noted below, Social history, Allergies, and medications have been entered into the medical record, reviewed, and corrections made.   Review of Systems: No fevers, chills, night sweats, weight loss, chest pain, or shortness of breath.   Objective:    General: Speaking clearly in complete sentences without any shortness of breath.  Alert and oriented x3.  Normal judgment. No apparent acute distress.   Impression and Recommendations:   DM - she wants one  more month to really work on her diet.  Warned her not to take more than 45 mg of Actos. That is the MAX dose.  She isn't using the glipizide bc it causes weight gain and hypoglycemia for her. Though says she took it a couple of time.  Stressed to her that she needs to get her fasting CBG under 130.    HTN - unable to check BP. Make sure taking HCTZ daily.  Encouraged her to get a pill box to help herself stay organized.    Weight gain - continue to work on weight loss to better control her blood sugars.     I discussed the assessment and treatment plan with the patient. The patient was provided an opportunity to ask questions and all were answered. The patient agreed with the plan and demonstrated an understanding of the instructions.   The patient was advised to call back or seek an in-person evaluation if the symptoms worsen or if the condition fails to improve as anticipated.  I provided 20 minutes of non-face-to-face time during this encounter.   Beatrice Lecher, MD

## 2019-02-18 NOTE — Telephone Encounter (Signed)
Called [facility] asked that refill be cancelled. This had not reached their pharmacy yet but will notate her profile to cancel.Jillian Huynh, [place]

## 2019-02-18 NOTE — Progress Notes (Signed)
Pt has not taken the medication because she is scared. She stated that she has been watching what she eats more. She has been taking the Actos 30 mg and she had some of the Actos15 mg left over and she has been taking that also (total 45 mg). Her BS have been around 175 in the mornings. She stated that she has lost about 5 lbs.   She reports that she has been swelling in her hands and feet over the past week and she realized that she hasn't been taking her hctz. She said that during this time she has been moving and everything is packed away.

## 2019-02-20 ENCOUNTER — Other Ambulatory Visit: Payer: Self-pay | Admitting: Family Medicine

## 2019-04-21 ENCOUNTER — Other Ambulatory Visit: Payer: Self-pay | Admitting: Family Medicine

## 2019-04-28 ENCOUNTER — Other Ambulatory Visit: Payer: Self-pay | Admitting: Family Medicine

## 2019-04-28 DIAGNOSIS — E1165 Type 2 diabetes mellitus with hyperglycemia: Secondary | ICD-10-CM

## 2019-05-17 ENCOUNTER — Other Ambulatory Visit: Payer: Self-pay | Admitting: Family Medicine

## 2019-05-25 ENCOUNTER — Ambulatory Visit (INDEPENDENT_AMBULATORY_CARE_PROVIDER_SITE_OTHER): Payer: Medicare HMO | Admitting: Family Medicine

## 2019-05-25 ENCOUNTER — Encounter: Payer: Self-pay | Admitting: Family Medicine

## 2019-05-25 ENCOUNTER — Other Ambulatory Visit: Payer: Self-pay

## 2019-05-25 VITALS — BP 132/74 | HR 66 | Ht 62.0 in | Wt 216.0 lb

## 2019-05-25 DIAGNOSIS — K760 Fatty (change of) liver, not elsewhere classified: Secondary | ICD-10-CM | POA: Diagnosis not present

## 2019-05-25 DIAGNOSIS — Z6831 Body mass index (BMI) 31.0-31.9, adult: Secondary | ICD-10-CM | POA: Insufficient documentation

## 2019-05-25 DIAGNOSIS — E118 Type 2 diabetes mellitus with unspecified complications: Secondary | ICD-10-CM

## 2019-05-25 DIAGNOSIS — R635 Abnormal weight gain: Secondary | ICD-10-CM

## 2019-05-25 DIAGNOSIS — I10 Essential (primary) hypertension: Secondary | ICD-10-CM

## 2019-05-25 LAB — POCT GLYCOSYLATED HEMOGLOBIN (HGB A1C): Hemoglobin A1C: 7.8 % — AB (ref 4.0–5.6)

## 2019-05-25 MED ORDER — FARXIGA 5 MG PO TABS: 5.0000 mg | ORAL_TABLET | Freq: Every day | ORAL | 0 refills | Status: DC

## 2019-05-25 NOTE — Progress Notes (Signed)
Established Patient Office Visit  Subjective:  Patient ID: Jillian Huynh, female    DOB: July 05, 1946  Age: 73 y.o. MRN: [id number]  CC:  Chief Complaint  Patient presents with  . Diabetes    HPI Jillian Huynh presents for   Diabetes - no hypoglycemic events. No wounds or sores that are not healing well. No increased thirst or urination. Checking glucose at home. Taking medications as prescribed without any side effects.  She is actually really been trying hard to make some changes in her diet and only drinks water.  She is not currently exercising but says is partly just because she feels so tired all the time.  She feels like she still just retaining a lot of fluid.  She is waking up with puffiness and fluid around her eyes in the morning and her weight just continues to go up.  She is currently on Actos and actually restarted her old  glipizide in an attempt to get her glucose down.  Last time she was here we had discussed adding some insulin and I even sent a prescription to the pharmacy.  But she says it makes her extremely upset and nervous.  She had a friend die on insulin and so is extremely timid about starting it.  She says she got serious about her diet and started to make some changes.  Hypertension- Pt denies chest pain, SOB, dizziness, or heart palpitations.  Taking meds as directed w/o problems.  Denies medication side effects.    Past Medical History:  Diagnosis Date  . Diabetes mellitus without complication ([place])   . Hyperlipidemia   . Hypertension     Past Surgical History:  Procedure Laterality Date  . CHOLECYSTECTOMY      Family History  Problem Relation Age of Onset  . Diabetes Father   . Cancer Father 74       Esophogeal Ca., Lung.  Jillian Huynh Hypertension Unknown   . Thyroid disease Unknown   . Colon cancer Daughter 54  . Glaucoma Mother   . Hepatitis C Mother 66       Died from.  . Glaucoma Sister   . Glaucoma Sister   . Glaucoma Maternal Aunt     Social  History   Socioeconomic History  . Marital status: Married    Spouse name: Biochemist, clinical  . Number of children: 3  . Years of education: college  . Highest education level: Not on file  Occupational History  . Occupation: Hair Stylist  Social Needs  . Financial resource strain: Not on file  . Food insecurity    Worry: Not on file    Inability: Not on file  . Transportation needs    Medical: Not on file    Non-medical: Not on file  Tobacco Use  . Smoking status: Never Smoker  . Smokeless tobacco: Never Used  Substance and Sexual Activity  . Alcohol use: No    Alcohol/week: 0.0 standard drinks  . Drug use: No  . Sexual activity: Yes    Partners: Male  Lifestyle  . Physical activity    Days per week: Not on file    Minutes per session: Not on file  . Stress: Not on file  Relationships  . Social Herbalist on phone: Not on file    Gets together: Not on file    Attends religious service: Not on file    Active member of club or organization: Not on file  Attends meetings of clubs or organizations: Not on file    Relationship status: Not on file  . Intimate partner violence    Fear of current or ex partner: Not on file    Emotionally abused: Not on file    Physically abused: Not on file    Forced sexual activity: Not on file  Other Topics Concern  . Not on file  Social History Narrative   No exercise.      Outpatient Medications Prior to Visit  Medication Sig Dispense Refill  . atorvastatin (LIPITOR) 10 MG tablet TAKE 1 TABLET BY MOUTH EVERYDAY AT BEDTIME 90 tablet 3  . Calcium Carbonate-Vit D-Min (CALCIUM 1200 PO) Take by mouth.    . Cholecalciferol (VITAMIN D3) 400 UNITS CAPS Take by mouth.    Jillian Huynh glipiZIDE (GLUCOTROL) 10 MG tablet Take 10 mg by mouth daily.    . hydrochlorothiazide (HYDRODIURIL) 12.5 MG tablet TAKE 1 TABLET BY MOUTH EVERY DAY 90 tablet 0  . losartan (COZAAR) 25 MG tablet TAKE 2 TABLETS BY MOUTH EVERY DAY 180 tablet 1  . metFORMIN  (GLUCOPHAGE-XR) 500 MG 24 hr tablet TAKE 2 TABLETS BY MOUTH EVERY DAY WITH BREAKFAST 180 tablet 1  . rOPINIRole (REQUIP) 3 MG tablet TAKE 1 TABLET (3 MG TOTAL) BY MOUTH DAILY. 90 tablet 1  . vitamin E 100 UNIT capsule Take by mouth daily.    . Alcohol Swabs (B-D SINGLE USE SWABS REGULAR) PADS For use when testing blood glucose daily and PRN. Dx: E11.9 (Patient not taking: Reported on 02/18/2019) 300 each 4  . Blood Glucose Calibration (TRUE METRIX LEVEL 1) Low SOLN For use for calibration of glucose meter as needed. (Patient not taking: Reported on 02/18/2019) 3 each prn  . Blood Glucose Monitoring Suppl (TRUE METRIX AIR GLUCOSE METER) DEVI 1 each by Does not apply route daily. For testing blood glucose daily and PRN. Dx:E11.9 (Patient not taking: Reported on 02/18/2019) 1 Device 0  . glucose blood (TRUE METRIX BLOOD GLUCOSE TEST) test strip For testing blood glucose daily and PRN. Dx: E11.9 (Patient not taking: Reported on 02/18/2019) 300 each 4  . insulin degludec (TRESIBA FLEXTOUCH) 100 UNIT/ML SOPN FlexTouch Pen Inject 0.1-0.25 mLs (10-25 Units total) into the skin daily. (Patient not taking: Reported on 02/18/2019) 6 mL 1  . Insulin Pen Needle 33G X 4 MM MISC 1 Dose by Does not apply route at bedtime. Use needle for [place] pen.  OV:FIEP 2 diabetes (Patient not taking: Reported on 02/18/2019) 100 each 1  . LANCETS SUPER THIN 28G MISC For testing blood glucose daily and PRN. PI:[id number] (Patient not taking: Reported on 02/18/2019) 630 each 4  . pioglitazone (ACTOS) 30 MG tablet TAKE 1 TABLET BY MOUTH EVERY DAY 30 tablet 2   No facility-administered medications prior to visit.     Allergies  Allergen Reactions  . Bydureon [Exenatide] Nausea And Vomiting  . [place] Gl[place] [Dapagliflozin] Other (See Comments)    Urinary urgency  . Trulicity [Dulaglutide] Diarrhea  . Invokana [Canagliflozin] Other (See Comments)    Yeast infection    ROS Review of Systems    Objective:    Physical Exam  Constitutional: She  is oriented to person, place, and time. She appears well-developed and well-nourished.  HENT:  Head: Normocephalic and atraumatic.  Eyes: Conjunctivae are normal.  Cardiovascular: Normal rate, regular rhythm and normal heart sounds.  Pulmonary/Chest: Effort normal and breath sounds normal.  Musculoskeletal:     Comments: Trace to 1+ pitting edema of the lower  extremities up to her knees bilaterally.  Feet with normal color and dorsal pedal pulse 2+  Neurological: She is alert and oriented to person, place, and time.  Skin: Skin is warm and dry.  Psychiatric: She has a normal mood and affect. Her behavior is normal.    BP 132/74   Pulse 66   Ht 5\' 2"  (1.575 m)   Wt 216 lb (98 kg)   SpO2 97%   BMI 39.51 kg/m  Wt Readings from Last 3 Encounters:  05/25/19 216 lb (98 kg)  01/07/19 208 lb (94.3 kg)  11/25/18 199 lb (90.3 kg)     Health Maintenance Due  Topic Date Due  . PNA vac Low Risk Adult (2 of 2 - PPSV23) 12/25/2018    There are no preventive care reminders to display for this patient.  Lab Results  Component Value Date   TSH 1.99 07/09/2018   Lab Results  Component Value Date   WBC 8.1 07/09/2018   HGB 12.3 07/09/2018   HCT 36.8 07/09/2018   MCV 81.8 07/09/2018   PLT 285 07/09/2018   Lab Results  Component Value Date   NA 136 02/11/2019   K 4.1 02/11/2019   CO2 23 02/11/2019   GLUCOSE 198 (H) 02/11/2019   BUN 17 02/11/2019   CREATININE 0.75 02/11/2019   BILITOT 0.6 09/17/2018   ALKPHOS 78 09/19/2016   AST 23 09/17/2018   ALT 31 (H) 09/17/2018   PROT 6.3 09/17/2018   ALBUMIN 4.3 09/19/2016   CALCIUM 9.7 02/11/2019   Lab Results  Component Value Date   CHOL 245 (H) 09/25/2017   Lab Results  Component Value Date   HDL 45 (L) 09/25/2017   Lab Results  Component Value Date   [facility]  09/25/2017     Comment:     . LDL cholesterol not calculated. Triglyceride levels greater than 400 mg/dL invalidate calculated LDL results. . Reference range:  <100 . Desirable range <100 mg/dL for primary prevention;   <70 mg/dL for patients with CHD or diabetic patients  with > or = 2 CHD risk factors. Jillian Huynh LDL-C is now calculated using the Martin-Hopkins  calculation, which is a validated novel method providing  better accuracy than the Friedewald equation in the  estimation of LDL-C.  Cresenciano Genre et al. Annamaria Helling. 2831;517(61): 2061-2068  (http://education.[facility].com/faq/FAQ164)    Lab Results  Component Value Date   TRIG 430 (H) 09/25/2017   Lab Results  Component Value Date   CHOLHDL 5.4 (H) 09/25/2017   Lab Results  Component Value Date   HGBA1C 9.5 (A) 01/07/2019      Assessment & Plan:   Problem List Items Addressed This Visit      Cardiovascular and Mediastinum   BP (high blood pressure)    Well controlled. Continue current regimen. Follow up in  4 months.        Digestive   Fatty liver    Continue to monitor liver enzymes every 6 months.        Endocrine   Diabetes mellitus type 2, controlled ([place]) - Primary    Uncontrolled but significantly improved.  Though I am very concerned that the Actos and glipizide, especially the combination are causing weight gain and fluid retention*really want her to stop both of them.  We discussed trying 1 of the flow since again to see if it is helpful.  In the past she had felt like it was causing some urinary frequency which is not uncommon and says  she is willing to try it again if it means that she might be able to hold off on insulin she became quite tearful about it.  Plan to follow-up in 3 months.  Continue to work on healthy diet and regular exercise.  She has actually made some really great strides in this direction and I applauded her for that and just encouraged her to keep it up.      Relevant Medications   dapagliflozin propanediol (FARXIGA) 5 MG TABS tablet   Other Relevant Orders   POCT glycosylated hemoglobin (Hb A1C)     Other   Severe obesity (BMI 35.0-39.9)  with comorbidity ([place])    She has gained about 8 pounds since I last saw her in February.  I am not sure how much of this may be fluid related she does not have any pitting edema around the ankles but says she is waking up with a puffy face.  Again I am going to stop the Actos and switch her to a flow zone which oftentimes acts as a diuretic.  Encouraged her to continue to work on healthy diet and regular exercise.      Relevant Medications   dapagliflozin propanediol (FARXIGA) 5 MG TABS tablet    Other Visit Diagnoses    Abnormal weight gain            Meds ordered this encounter  Medications  . dapagliflozin propanediol (FARXIGA) 5 MG TABS tablet    Sig: Take 5 mg by mouth daily.    Dispense:  30 tablet    Refill:  0    Follow-up: Return in about 3 months (around 08/25/2019) for Diabetes follow-up.    Beatrice Lecher, MD

## 2019-05-25 NOTE — Assessment & Plan Note (Signed)
Well controlled. Continue current regimen. Follow up in  4 months.   

## 2019-05-25 NOTE — Assessment & Plan Note (Signed)
Continue to monitor liver enzymes every 6 months.

## 2019-05-25 NOTE — Assessment & Plan Note (Signed)
Uncontrolled but significantly improved.  Though I am very concerned that the Actos and glipizide, especially the combination are causing weight gain and fluid retention*really want her to stop both of them.  We discussed trying 1 of the flow since again to see if it is helpful.  In the past she had felt like it was causing some urinary frequency which is not uncommon and says she is willing to try it again if it means that she might be able to hold off on insulin she became quite tearful about it.  Plan to follow-up in 3 months.  Continue to work on healthy diet and regular exercise.  She has actually made some really great strides in this direction and I applauded her for that and just encouraged her to keep it up.

## 2019-05-25 NOTE — Progress Notes (Signed)
Pt reports that her home BS's have been around 150-165.Jillian Huynh, [place]

## 2019-05-25 NOTE — Patient Instructions (Signed)
Tinea glipizide and Actos.  We can switch to [place].  I wrote for the 5 mg dose daily it will make you pee more that is partly how it works.  If you feel you are getting a bladder infection though please let us know.  Sometimes that can happen on the medication but we can usually get it cleared up.  If after a couple of weeks on the 5 mg dose you are doing well please let us know and I can send over new prescription for the 10 mg dose the try to really get your sugars under control.  You really made some great strides and dietary change and just encourage you to keep that up.

## 2019-05-25 NOTE — Assessment & Plan Note (Signed)
She has gained about 8 pounds since I last saw her in February.  I am not sure how much of this may be fluid related she does not have any pitting edema around the ankles but says she is waking up with a puffy face.  Again I am going to stop the Actos and switch her to a flow zone which oftentimes acts as a diuretic.  Encouraged her to continue to work on healthy diet and regular exercise.

## 2019-05-26 ENCOUNTER — Telehealth: Payer: Self-pay

## 2019-05-26 NOTE — Telephone Encounter (Signed)
Jillian Huynh,  Can you do a tier exception for the [place]? See Dr Gardiner Ramus message. All the insulins are also a tier 3 just like the [place].

## 2019-05-26 NOTE — Telephone Encounter (Signed)
Unfortunately she really cannot take the other she has been on them and they are causing significant weight gain and fluid retention.  So in some ways we are stuck with moving to a higher tier unless she is willing to do insulin which I know she has been really hesitant to do so.  Do we know what follows under tier 2?  That might be a little bit more reasonable

## 2019-05-26 NOTE — Telephone Encounter (Signed)
Jillian Huynh called and left a message stating the Jillian Huynh cost was $298. I did print off her Formulary. All the medications in that class are the same tier. Glimepiride, Glipizide ER, Glipizide XL, Pioglitazone hcl-glimepiride and Repaglinide are tier 1.

## 2019-05-27 NOTE — Telephone Encounter (Signed)
I am working on the tier exception. I have had the PCP sign the form and will fax today. I have called the patient to let her know that I am working on this for her. She was appreciated and voices understanding. I advised her this can take a few weeks and she is ok with that so she can get her medication cheaper.

## 2019-05-29 NOTE — Telephone Encounter (Signed)
I received a denial letter from [place] and insurance does not want to do a tier exception for this patient. I have let her know that we are trying to get her some assistance on her medication. She is so terrified that she will have to go on injections and does not want to at this time. I let her know that we would be in touch and she was appreciative of all the efforts.

## 2019-06-04 NOTE — Telephone Encounter (Signed)
Received patient samples in the office. The patient will have a friend pick them up for her.   Lot: [id number] EXP: 09/11/2021. 5 boxes given.

## 2019-06-19 ENCOUNTER — Other Ambulatory Visit: Payer: Self-pay | Admitting: Family Medicine

## 2019-07-08 ENCOUNTER — Telehealth: Payer: Self-pay

## 2019-07-08 NOTE — Telephone Encounter (Signed)
Jillian Huynh called and states the [place] is not controlling her blood sugars and it is making her feel bad. She would like a different medication.

## 2019-07-09 NOTE — Telephone Encounter (Signed)
The Jillian Huynh comes in 5 mg and 10 mg.  I started her on the lower dose just to make sure that she would tolerate it well so if it does not seem to be controlling her sugars well I would recommend that we increase her dose to 10 mg for at least 2 to 3 weeks to see if she notices improvement before switching medications.  If she is okay with that care plan then okay to send the 10 mg tabs to her pharmacy for 30-day supply.  Also please let her know that we do have her vaccines available for the flu, as well as pneumonia

## 2019-07-10 NOTE — Telephone Encounter (Signed)
No answer @ 4:10pm and was unable to LM.

## 2019-07-15 MED ORDER — FARXIGA 10 MG PO TABS: 10.0000 mg | ORAL_TABLET | Freq: Every day | ORAL | 1 refills | Status: DC

## 2019-07-15 NOTE — Telephone Encounter (Signed)
Discussed with patient, she is OK with plan. RX sent.

## 2019-07-15 NOTE — Telephone Encounter (Signed)
Patient called back stating medication was going to be $300.. I have given Barnet Pall the information and asked that she contact the pharmacy to see if prior Josem Kaufmann is needed

## 2019-07-17 NOTE — Telephone Encounter (Signed)
I called Jillian Huynh and she had Dr. Jerilynn Mages sign for Farxiga samples to get the patient enough medicine to get the patient through the end of the year. I will call patient when the medicine comes in the office.

## 2019-07-19 ENCOUNTER — Other Ambulatory Visit: Payer: Self-pay | Admitting: Family Medicine

## 2019-07-23 NOTE — Telephone Encounter (Signed)
I received 10 boxes of Farxiga for this patient in the office.  She will stop by on 07/24/2019 to pick them up. The front office is aware. The front office is aware.  [id number] Lot: [id number] Expire: 04-30/2023.

## 2019-08-01 ENCOUNTER — Other Ambulatory Visit: Payer: Self-pay | Admitting: Family Medicine

## 2019-08-01 DIAGNOSIS — E1165 Type 2 diabetes mellitus with hyperglycemia: Secondary | ICD-10-CM

## 2019-08-12 ENCOUNTER — Other Ambulatory Visit: Payer: Self-pay | Admitting: Family Medicine

## 2019-08-25 ENCOUNTER — Ambulatory Visit (INDEPENDENT_AMBULATORY_CARE_PROVIDER_SITE_OTHER): Payer: Medicare HMO | Admitting: Family Medicine

## 2019-08-25 ENCOUNTER — Other Ambulatory Visit: Payer: Self-pay

## 2019-08-25 ENCOUNTER — Encounter: Payer: Self-pay | Admitting: Family Medicine

## 2019-08-25 VITALS — BP 108/78 | HR 65 | Ht 62.0 in | Wt 208.0 lb

## 2019-08-25 DIAGNOSIS — T50905A Adverse effect of unspecified drugs, medicaments and biological substances, initial encounter: Secondary | ICD-10-CM | POA: Diagnosis not present

## 2019-08-25 DIAGNOSIS — E118 Type 2 diabetes mellitus with unspecified complications: Secondary | ICD-10-CM

## 2019-08-25 DIAGNOSIS — K760 Fatty (change of) liver, not elsewhere classified: Secondary | ICD-10-CM | POA: Diagnosis not present

## 2019-08-25 DIAGNOSIS — I10 Essential (primary) hypertension: Secondary | ICD-10-CM | POA: Diagnosis not present

## 2019-08-25 LAB — POCT GLYCOSYLATED HEMOGLOBIN (HGB A1C): Hemoglobin A1C: 7.6 % — AB (ref 4.0–5.6)

## 2019-08-25 NOTE — Patient Instructions (Addendum)
With your insurance to see if they will cover Invokana or Jardiance or Steglatro,   better than the [place].  They all work very similarly.

## 2019-08-25 NOTE — Assessment & Plan Note (Signed)
Due to recheck liver enzymes. The weight loss is fantastic. Encouraged her to continue to work at it.

## 2019-08-25 NOTE — Assessment & Plan Note (Addendum)
A1C is improved today at 7.6.  Discussed options.  She will check with her insurance to see if another drug in that same class might be better covered than the [place].  Reminded her that it will probably change again in January as formularies usually change at the beginning of the year.  Goal is to get her A1c under 7 if possible.  She has made some great changes in diet as well just encouraged her to continue to work out it and work at the weight loss and hopefully her next A1c will be even better.

## 2019-08-25 NOTE — Assessment & Plan Note (Signed)
Pressure looks much better today and in fact actually looks a little bit on the lower end.  May have her try to hold her hydrochlorothiazide until I see her back.  She can always restart it if she notices her blood pressures going back up.

## 2019-08-25 NOTE — Progress Notes (Signed)
Established Patient Office Visit  Subjective:  Patient ID: Jillian Huynh, female    DOB: 02-27-1946  Age: 73 y.o. MRN: [id number]  CC:  Chief Complaint  Patient presents with  . Diabetes    HPI Jillian Huynh presents for   Diabetes - no hypoglycemic events. No wounds or sores that are not healing well. No increased thirst or urination. Checking glucose at home. Taking medications as prescribed without any side effects. She has been on [place] for about 2 months and says she has felt much better on this regimen.  She is actually lost about 8 pounds and has been working on her diet.  She said she was feeling so tired and exhausted and worn out that she was actually thinking about retiring and now she just feels so much better like her old self.  She has more energy.  She thinks just coming off the glipizide has made a lot of difference.  The biggest concern is cost.  It is her part is about $300 of the [place] and the cost will continue to go up.    Hypertension- Pt denies chest pain, SOB, dizziness, or heart palpitations.  Taking meds as directed w/o problems.  Denies medication side effects.      Past Medical History:  Diagnosis Date  . Diabetes mellitus without complication ([place])   . Hyperlipidemia   . Hypertension     Past Surgical History:  Procedure Laterality Date  . CHOLECYSTECTOMY      Family History  Problem Relation Age of Onset  . Diabetes Father   . Cancer Father 27       Esophogeal Ca., Lung.  Marland Kitchen Hypertension Unknown   . Thyroid disease Unknown   . Colon cancer Daughter 21  . Glaucoma Mother   . Hepatitis C Mother 68       Died from.  . Glaucoma Sister   . Glaucoma Sister   . Glaucoma Maternal Aunt     Social History   Socioeconomic History  . Marital status: Married    Spouse name: Biochemist, clinical  . Number of children: 3  . Years of education: college  . Highest education level: Not on file  Occupational History  . Occupation: Hair Stylist  Social  Needs  . Financial resource strain: Not on file  . Food insecurity    Worry: Not on file    Inability: Not on file  . Transportation needs    Medical: Not on file    Non-medical: Not on file  Tobacco Use  . Smoking status: Never Smoker  . Smokeless tobacco: Never Used  Substance and Sexual Activity  . Alcohol use: No    Alcohol/week: 0.0 standard drinks  . Drug use: No  . Sexual activity: Yes    Partners: Male  Lifestyle  . Physical activity    Days per week: Not on file    Minutes per session: Not on file  . Stress: Not on file  Relationships  . Social Herbalist on phone: Not on file    Gets together: Not on file    Attends religious service: Not on file    Active member of club or organization: Not on file    Attends meetings of clubs or organizations: Not on file    Relationship status: Not on file  . Intimate partner violence    Fear of current or ex partner: Not on file    Emotionally abused: Not on file  Physically abused: Not on file    Forced sexual activity: Not on file  Other Topics Concern  . Not on file  Social History Narrative   No exercise.      Outpatient Medications Prior to Visit  Medication Sig Dispense Refill  . atorvastatin (LIPITOR) 10 MG tablet TAKE 1 TABLET BY MOUTH EVERYDAY AT BEDTIME 90 tablet 3  . Calcium Carbonate-Vit D-Min (CALCIUM 1200 PO) Take by mouth.    . Cholecalciferol (VITAMIN D3) 400 UNITS CAPS Take by mouth.    . dapagliflozin propanediol (FARXIGA) 10 MG TABS tablet Take 10 mg by mouth daily before breakfast. 30 tablet 1  . losartan (COZAAR) 25 MG tablet TAKE 2 TABLETS BY MOUTH EVERY DAY 180 tablet 1  . metFORMIN (GLUCOPHAGE-XR) 500 MG 24 hr tablet TAKE 2 TABLETS BY MOUTH EVERY DAY WITH BREAKFAST 180 tablet 1  . rOPINIRole (REQUIP) 3 MG tablet TAKE 1 TABLET (3 MG TOTAL) BY MOUTH DAILY. 90 tablet 1  . vitamin E 100 UNIT capsule Take by mouth daily.    . hydrochlorothiazide (HYDRODIURIL) 12.5 MG tablet TAKE 1 TABLET  BY MOUTH EVERY DAY 90 tablet 0   No facility-administered medications prior to visit.     Allergies  Allergen Reactions  . Bydureon [Exenatide] Nausea And Vomiting  . Glipizide Other (See Comments)    Leg cramps, fatigue, and weight gain  . Trulicity [Dulaglutide] Diarrhea  . Invokana [Canagliflozin] Other (See Comments)    Yeast infection    ROS Review of Systems    Objective:    Physical Exam  Constitutional: She is oriented to person, place, and time. She appears well-developed and well-nourished.  HENT:  Head: Normocephalic and atraumatic.  Cardiovascular: Normal rate, regular rhythm and normal heart sounds.  Pulmonary/Chest: Effort normal and breath sounds normal.  Neurological: She is alert and oriented to person, place, and time.  Skin: Skin is warm and dry.  Psychiatric: She has a normal mood and affect. Her behavior is normal.    BP 108/78   Pulse 65   Ht 5\' 2"  (1.575 m)   Wt 208 lb (94.3 kg)   SpO2 95%   BMI 38.04 kg/m  Wt Readings from Last 3 Encounters:  08/25/19 208 lb (94.3 kg)  05/25/19 216 lb (98 kg)  01/07/19 208 lb (94.3 kg)     Health Maintenance Due  Topic Date Due  . PNA vac Low Risk Adult (2 of 2 - PPSV23) 12/25/2018    There are no preventive care reminders to display for this patient.  Lab Results  Component Value Date   TSH 1.99 07/09/2018   Lab Results  Component Value Date   WBC 8.1 07/09/2018   HGB 12.3 07/09/2018   HCT 36.8 07/09/2018   MCV 81.8 07/09/2018   PLT 285 07/09/2018   Lab Results  Component Value Date   NA 136 02/11/2019   K 4.1 02/11/2019   CO2 23 02/11/2019   GLUCOSE 198 (H) 02/11/2019   BUN 17 02/11/2019   CREATININE 0.75 02/11/2019   BILITOT 0.6 09/17/2018   ALKPHOS 78 09/19/2016   AST 23 09/17/2018   ALT 31 (H) 09/17/2018   PROT 6.3 09/17/2018   ALBUMIN 4.3 09/19/2016   CALCIUM 9.7 02/11/2019   Lab Results  Component Value Date   CHOL 245 (H) 09/25/2017   Lab Results  Component Value  Date   HDL 45 (L) 09/25/2017   Lab Results  Component Value Date   [facility]  09/25/2017  Comment:     . LDL cholesterol not calculated. Triglyceride levels greater than 400 mg/dL invalidate calculated LDL results. . Reference range: <100 . Desirable range <100 mg/dL for primary prevention;   <70 mg/dL for patients with CHD or diabetic patients  with > or = 2 CHD risk factors. Marland Kitchen LDL-C is now calculated using the Martin-Hopkins  calculation, which is a validated novel method providing  better accuracy than the Friedewald equation in the  estimation of LDL-C.  Cresenciano Genre et al. Annamaria Helling. [id number]): 2061-2068  (http://education.[facility].com/faq/FAQ164)    Lab Results  Component Value Date   TRIG 430 (H) 09/25/2017   Lab Results  Component Value Date   CHOLHDL 5.4 (H) 09/25/2017   Lab Results  Component Value Date   HGBA1C 7.6 (A) 08/25/2019      Assessment & Plan:   Problem List Items Addressed This Visit      Cardiovascular and Mediastinum   BP (high blood pressure)    Pressure looks much better today and in fact actually looks a little bit on the lower end.  May have her try to hold her hydrochlorothiazide until I see her back.  She can always restart it if she notices her blood pressures going back up.      Relevant Orders   COMPLETE METABOLIC PANEL WITH GFR   Lipid panel     Digestive   Fatty liver    Due to recheck liver enzymes. The weight loss is fantastic. Encouraged her to continue to work at it.         Endocrine   Diabetes mellitus type 2, controlled ([place]) - Primary    A1C is improved today at 7.6.  Discussed options.  She will check with her insurance to see if another drug in that same class might be better covered than the [place].  Reminded her that it will probably change again in January as formularies usually change at the beginning of the year.  Goal is to get her A1c under 7 if possible.  She has made some great changes in diet as  well just encouraged her to continue to work out it and work at the weight loss and hopefully her next A1c will be even better.      Relevant Orders   POCT glycosylated hemoglobin (Hb A1C) (Completed)   COMPLETE METABOLIC PANEL WITH GFR   Lipid panel    Other Visit Diagnoses    Medication side effect, initial encounter          Medication side effect-we will add glipizide to her intolerance l list.  And actually removed Farxiga from her intolerance list.  She had tried it previously about a year ago and had urinary frequency but I really think that was due to some dietary indiscretion.  No orders of the defined types were placed in this encounter.   Follow-up: Return in about 3 months (around 11/25/2019) for Diabetes follow-up.    Beatrice Lecher, MD

## 2019-08-26 DIAGNOSIS — E119 Type 2 diabetes mellitus without complications: Secondary | ICD-10-CM | POA: Diagnosis not present

## 2019-08-26 LAB — COMPLETE METABOLIC PANEL WITH GFR
AG Ratio: 1.8 (calc) (ref 1.0–2.5)
ALT: 18 U/L (ref 6–29)
AST: 15 U/L (ref 10–35)
Albumin: 4.3 g/dL (ref 3.6–5.1)
Alkaline phosphatase (APISO): 76 U/L (ref 37–153)
BUN/Creatinine Ratio: 29 (calc) — ABNORMAL HIGH (ref 6–22)
BUN: 17 mg/dL (ref 7–25)
CO2: 27 mmol/L (ref 20–32)
Calcium: 10 mg/dL (ref 8.6–10.4)
Chloride: 102 mmol/L (ref 98–110)
Creat: 0.59 mg/dL — ABNORMAL LOW (ref 0.60–0.93)
GFR, Est African American: 106 mL/min/{1.73_m2} (ref >=60)
GFR, Est Non African American: 92 mL/min/{1.73_m2} (ref >=60)
Globulin: 2.4 g/dL (calc) (ref 1.9–3.7)
Glucose, Bld: 162 mg/dL — ABNORMAL HIGH (ref 65–99)
Potassium: 3.8 mmol/L (ref 3.5–5.3)
Sodium: 139 mmol/L (ref 135–146)
Total Bilirubin: 1.1 mg/dL (ref 0.2–1.2)
Total Protein: 6.7 g/dL (ref 6.1–8.1)

## 2019-08-26 LAB — LIPID PANEL
Cholesterol: 156 mg/dL (ref <200)
HDL: 38 mg/dL — ABNORMAL LOW (ref >=50)
LDL Cholesterol (Calc): 71 mg/dL (calc)
Non-HDL Cholesterol (Calc): 118 mg/dL (calc) (ref <130)
Total CHOL/HDL Ratio: 4.1 (calc) (ref <5.0)
Triglycerides: 390 mg/dL — ABNORMAL HIGH (ref <150)

## 2019-09-02 NOTE — Telephone Encounter (Signed)
Received 10 boxes of Farxiga in the office for the patient.  I have called her to let her know they are in the office. She reports that she is going to pick up on 09/02/2019. I have notified the front office that she will be stopping by to pick up and they are in my office.   Lot number: [id number] Expiration Date: 02/09/2022.

## 2019-11-16 ENCOUNTER — Other Ambulatory Visit: Payer: Self-pay | Admitting: Family Medicine

## 2019-11-21 ENCOUNTER — Other Ambulatory Visit: Payer: Self-pay | Admitting: Family Medicine

## 2019-11-30 ENCOUNTER — Ambulatory Visit: Payer: Medicare HMO | Admitting: Family Medicine

## 2019-11-30 DIAGNOSIS — Z03818 Encounter for observation for suspected exposure to other biological agents ruled out: Secondary | ICD-10-CM | POA: Diagnosis not present

## 2019-11-30 DIAGNOSIS — Z20828 Contact with and (suspected) exposure to other viral communicable diseases: Secondary | ICD-10-CM | POA: Diagnosis not present

## 2019-12-09 ENCOUNTER — Encounter: Payer: Self-pay | Admitting: Family Medicine

## 2019-12-09 ENCOUNTER — Ambulatory Visit (INDEPENDENT_AMBULATORY_CARE_PROVIDER_SITE_OTHER): Payer: Medicare HMO | Admitting: Family Medicine

## 2019-12-09 ENCOUNTER — Ambulatory Visit: Payer: Medicare HMO | Admitting: Family Medicine

## 2019-12-09 VITALS — Ht 62.0 in | Wt 196.0 lb

## 2019-12-09 DIAGNOSIS — I10 Essential (primary) hypertension: Secondary | ICD-10-CM

## 2019-12-09 DIAGNOSIS — U071 COVID-19: Secondary | ICD-10-CM

## 2019-12-09 DIAGNOSIS — E118 Type 2 diabetes mellitus with unspecified complications: Secondary | ICD-10-CM

## 2019-12-09 MED ORDER — FARXIGA 10 MG PO TABS: 10.0000 mg | ORAL_TABLET | Freq: Every day | ORAL | 1 refills | Status: DC

## 2019-12-09 NOTE — Assessment & Plan Note (Signed)
She does not have a way to check her blood pressure right now.  I am not sure if she still taking the hydrochlorothiazide or not.  She said she had actually stopped one of the blood pressure medications but I was not sure which one.  So we will just need to keep an eye on that when she comes back in.

## 2019-12-09 NOTE — Progress Notes (Signed)
Pt stated that she was dx on Monday w/COVID. She reports that she feels achy all over and has lost weight in addition to having a dry mouth.  She informed me that she can't afford the [place] she stated that it is $300 a month. Asked about getting samples for this.Marland Kitchen

## 2019-12-09 NOTE — Progress Notes (Addendum)
Virtual Visit via telephone note  I connected with Jillian Huynh on 12/09/19 at  9:30 AM EST by a video enabled telemedicine application and verified that I am speaking with the correct person using two identifiers.  Patient was unable to connect the video component and so converted to telephone call.   I discussed the limitations of evaluation and management by telemedicine and the availability of in person appointments. The patient expressed understanding and agreed to proceed.    Established Patient Office Visit  Subjective:  Patient ID: Jillian Huynh, female    DOB: 12-31-1945  Age: 74 y.o. MRN: [id number]  CC:  Chief Complaint  Patient presents with  . Diabetes    HPI Jillian Huynh presents for   Diabetes - no hypoglycemic events. No wounds or sores that are not healing well. No increased thirst or urination. Checking glucose at home. Taking medications as prescribed without any side effects.  Says she is really been working on trying to lose weight.  She is actually down about 12 pounds based on her home scale.  She is still trying to work on eating a little better but says that her fasting blood sugars in the morning are still running in the upper 100s and low 200s even with the new medication.  He says that she was diagnosed with Covid and her appetite has been down she says her fasting blood sugars have actually looks great.  Still struggling with eating carbs and things like cookies.  Hypertension- Pt denies chest pain, SOB, dizziness, or heart palpitations.  Taking meds as directed w/o problems.  Denies medication side effects.     She is also COVID positive.  She was tested on Monday.  He says over the weekend she started to not feel well with body aches dry mouth and decreased appetite.  She says it feels like the flu but slightly different.  She has been working and so is not exactly sure where she got it but she has been around others.  Past Medical History:  Diagnosis Date  .  Diabetes mellitus without complication ([place])   . Hyperlipidemia   . Hypertension     Past Surgical History:  Procedure Laterality Date  . CHOLECYSTECTOMY      Family History  Problem Relation Age of Onset  . Diabetes Father   . Cancer Father 60       Esophogeal Ca., Lung.  Marland Kitchen Hypertension Unknown   . Thyroid disease Unknown   . Colon cancer Daughter 62  . Glaucoma Mother   . Hepatitis C Mother 56       Died from.  . Glaucoma Sister   . Glaucoma Sister   . Glaucoma Maternal Aunt     Social History   Socioeconomic History  . Marital status: Married    Spouse name: Biochemist, clinical  . Number of children: 3  . Years of education: college  . Highest education level: Not on file  Occupational History  . Occupation: Hair Stylist  Tobacco Use  . Smoking status: Never Smoker  . Smokeless tobacco: Never Used  Substance and Sexual Activity  . Alcohol use: No    Alcohol/week: 0.0 standard drinks  . Drug use: No  . Sexual activity: Yes    Partners: Male  Other Topics Concern  . Not on file  Social History Narrative   No exercise.     Social Determinants of Health   Financial Resource Strain:   . Difficulty of Paying Living Expenses:  Not on file  Food Insecurity:   . Worried About Charity fundraiser in the Last Year: Not on file  . Ran Out of Food in the Last Year: Not on file  Transportation Needs:   . Lack of Transportation (Medical): Not on file  . Lack of Transportation (Non-Medical): Not on file  Physical Activity:   . Days of Exercise per Week: Not on file  . Minutes of Exercise per Session: Not on file  Stress:   . Feeling of Stress : Not on file  Social Connections:   . Frequency of Communication with Friends and Family: Not on file  . Frequency of Social Gatherings with Friends and Family: Not on file  . Attends Religious Services: Not on file  . Active Member of Clubs or Organizations: Not on file  . Attends Archivist Meetings: Not on file  .  Marital Status: Not on file  Intimate Partner Violence:   . Fear of Current or Ex-Partner: Not on file  . Emotionally Abused: Not on file  . Physically Abused: Not on file  . Sexually Abused: Not on file    Outpatient Medications Prior to Visit  Medication Sig Dispense Refill  . atorvastatin (LIPITOR) 10 MG tablet TAKE 1 TABLET BY MOUTH EVERYDAY AT BEDTIME 90 tablet 3  . Calcium Carbonate-Vit D-Min (CALCIUM 1200 PO) Take by mouth.    . Cholecalciferol (VITAMIN D3) 400 UNITS CAPS Take by mouth.    . hydrochlorothiazide (HYDRODIURIL) 12.5 MG tablet TAKE 1 TABLET BY MOUTH EVERY DAY 90 tablet 0  . losartan (COZAAR) 25 MG tablet TAKE 2 TABLETS BY MOUTH EVERY DAY 180 tablet 1  . metFORMIN (GLUCOPHAGE-XR) 500 MG 24 hr tablet TAKE 2 TABLETS BY MOUTH EVERY DAY WITH BREAKFAST 180 tablet 1  . rOPINIRole (REQUIP) 3 MG tablet TAKE 1 TABLET (3 MG TOTAL) BY MOUTH DAILY. 90 tablet 1  . vitamin E 100 UNIT capsule Take by mouth daily.    . dapagliflozin propanediol (FARXIGA) 10 MG TABS tablet Take 10 mg by mouth daily before breakfast. 30 tablet 1   No facility-administered medications prior to visit.    Allergies  Allergen Reactions  . Bydureon [Exenatide] Nausea And Vomiting  . Glipizide Other (See Comments)    Leg cramps, fatigue, and weight gain  . Trulicity [Dulaglutide] Diarrhea  . Invokana [Canagliflozin] Other (See Comments)    Yeast infection    ROS Review of Systems    Objective:    Physical Exam  Constitutional: She is oriented to person, place, and time.  Neurological: She is alert and oriented to person, place, and time.  Psychiatric: She has a normal mood and affect. Her behavior is normal.    Ht 5\' 2"  (1.575 m)   Wt 196 lb (88.9 kg)   BMI 35.85 kg/m  Wt Readings from Last 3 Encounters:  12/09/19 196 lb (88.9 kg)  08/25/19 208 lb (94.3 kg)  05/25/19 216 lb (98 kg)     Health Maintenance Due  Topic Date Due  . PNA vac Low Risk Adult (2 of 2 - PPSV23) 12/25/2018     There are no preventive care reminders to display for this patient.  Lab Results  Component Value Date   TSH 1.99 07/09/2018   Lab Results  Component Value Date   WBC 8.1 07/09/2018   HGB 12.3 07/09/2018   HCT 36.8 07/09/2018   MCV 81.8 07/09/2018   PLT 285 07/09/2018   Lab Results  Component Value  Date   NA 139 08/25/2019   K 3.8 08/25/2019   CO2 27 08/25/2019   GLUCOSE 162 (H) 08/25/2019   BUN 17 08/25/2019   CREATININE 0.59 (L) 08/25/2019   BILITOT 1.1 08/25/2019   ALKPHOS 78 09/19/2016   AST 15 08/25/2019   ALT 18 08/25/2019   PROT 6.7 08/25/2019   ALBUMIN 4.3 09/19/2016   CALCIUM 10.0 08/25/2019   Lab Results  Component Value Date   CHOL 156 08/25/2019   Lab Results  Component Value Date   HDL 38 (L) 08/25/2019   Lab Results  Component Value Date   LDLCALC 71 08/25/2019   Lab Results  Component Value Date   TRIG 390 (H) 08/25/2019   Lab Results  Component Value Date   CHOLHDL 4.1 08/25/2019   Lab Results  Component Value Date   HGBA1C 7.6 (A) 08/25/2019      Assessment & Plan:   Problem List Items Addressed This Visit      Cardiovascular and Mediastinum   BP (high blood pressure)    She does not have a way to check her blood pressure right now.  I am not sure if she still taking the hydrochlorothiazide or not.  She said she had actually stopped one of the blood pressure medications but I was not sure which one.  So we will just need to keep an eye on that when she comes back in.        Endocrine   Diabetes mellitus type 2, controlled ([place]) - Primary    Due for A1c.  She can go in a couple weeks when she is recovered from Covid and she is off quarantine and will just see how she is doing with the [place].  Unfortunately I think is going to be quite costly on her current plan.  But at the same time she is had trials with other medication has not been successful.  She says she is still taking the Metformin as well just continue to work on  diet and really stressed cutting back on carb intake.      Relevant Medications   dapagliflozin propanediol (FARXIGA) 10 MG TABS tablet   Other Relevant Orders   HgB A1c    Other Visit Diagnoses    COVID-19         COVID-19-right now she is actually doing okay.  Symptoms are fairly mild.  Encouraged her to call us back if any point she develops any new symptoms or shortness of breath or feels like she is getting worse.  Meds ordered this encounter  Medications  . dapagliflozin propanediol (FARXIGA) 10 MG TABS tablet    Sig: Take 10 mg by mouth daily before breakfast.    Dispense:  30 tablet    Refill:  1    Follow-up: Return in about 13 weeks (around 03/09/2020) for Diabetes follow-up.   Time spent 25 min in encounter.   I discussed the assessment and treatment plan with the patient. The patient was provided an opportunity to ask questions and all were answered. The patient agreed with the plan and demonstrated an understanding of the instructions.   The patient was advised to call back or seek an in-person evaluation if the symptoms worsen or if the condition fails to improve as anticipated.    Beatrice Lecher, MD

## 2019-12-09 NOTE — Assessment & Plan Note (Signed)
Due for A1c.  She can go in a couple weeks when she is recovered from Covid and she is off quarantine and will just see how she is doing with the [place].  Unfortunately I think is going to be quite costly on her current plan.  But at the same time she is had trials with other medication has not been successful.  She says she is still taking the Metformin as well just continue to work on diet and really stressed cutting back on carb intake.

## 2019-12-10 DIAGNOSIS — E119 Type 2 diabetes mellitus without complications: Secondary | ICD-10-CM | POA: Diagnosis not present

## 2019-12-21 ENCOUNTER — Other Ambulatory Visit: Payer: Self-pay | Admitting: Family Medicine

## 2019-12-24 ENCOUNTER — Other Ambulatory Visit: Payer: Self-pay | Admitting: *Deleted

## 2019-12-24 MED ORDER — FLUCONAZOLE 150 MG PO TABS: 150.0000 mg | ORAL_TABLET | Freq: Once | ORAL | 1 refills | Status: AC

## 2020-01-11 DIAGNOSIS — E118 Type 2 diabetes mellitus with unspecified complications: Secondary | ICD-10-CM | POA: Diagnosis not present

## 2020-01-12 LAB — HEMOGLOBIN A1C
Hgb A1c MFr Bld: 8.8 % of total Hgb — ABNORMAL HIGH (ref <5.7)
Mean Plasma Glucose: 206 (calc)
eAG (mmol/L): 11.4 (calc)

## 2020-01-14 ENCOUNTER — Other Ambulatory Visit: Payer: Self-pay | Admitting: Family Medicine

## 2020-01-14 DIAGNOSIS — E118 Type 2 diabetes mellitus with unspecified complications: Secondary | ICD-10-CM

## 2020-01-14 MED ORDER — FARXIGA 10 MG PO TABS: 10.0000 mg | ORAL_TABLET | Freq: Every day | ORAL | 5 refills | Status: DC

## 2020-01-14 MED ORDER — SAXAGLIPTIN HCL 5 MG PO TABS: 5.0000 mg | ORAL_TABLET | Freq: Every day | ORAL | 1 refills | Status: DC

## 2020-01-18 ENCOUNTER — Other Ambulatory Visit: Payer: Self-pay | Admitting: Family Medicine

## 2020-01-20 DIAGNOSIS — H524 Presbyopia: Secondary | ICD-10-CM | POA: Diagnosis not present

## 2020-01-20 DIAGNOSIS — E119 Type 2 diabetes mellitus without complications: Secondary | ICD-10-CM | POA: Diagnosis not present

## 2020-01-20 DIAGNOSIS — Z135 Encounter for screening for eye and ear disorders: Secondary | ICD-10-CM | POA: Diagnosis not present

## 2020-02-03 ENCOUNTER — Other Ambulatory Visit: Payer: Self-pay | Admitting: Family Medicine

## 2020-02-06 ENCOUNTER — Other Ambulatory Visit: Payer: Self-pay | Admitting: Family Medicine

## 2020-02-21 ENCOUNTER — Other Ambulatory Visit: Payer: Self-pay | Admitting: Family Medicine

## 2020-03-02 ENCOUNTER — Telehealth: Payer: Self-pay

## 2020-03-02 NOTE — Telephone Encounter (Signed)
Jillian Huynh called and left a message stating confusion about her blood pressure medication. I called and left a message for a return call.

## 2020-03-10 NOTE — Telephone Encounter (Signed)
Call pt: she probably has it on auto-refill which is why they keep trying to fill it.  We can cal and cancel any remaining refills but in future she may want to have them remove auto-refill

## 2020-03-10 NOTE — Telephone Encounter (Signed)
[name] called back and states she has been trying to get in touch with our office. She states the pharmacy keeps refilling the HCTZ even though she was taken off the medication. She has been scheduled for a follow up May 10th.   BP (high blood pressure) - [name], MD at 08/25/2019 11:33 AM  Status: Written  Related Problem: BP (high blood pressure)    Pressure looks much better today and in fact actually looks a little bit on the lower end.  May have her try to hold her hydrochlorothiazide until I see her back.  She can always restart it if she notices her blood pressures going back up.

## 2020-03-11 NOTE — Telephone Encounter (Signed)
Cancelled medication off medication list. I did also call the pharmacy and cancel prescription. Unable to reach patient, phone rings and hangs up.

## 2020-03-15 NOTE — Telephone Encounter (Signed)
Patient advised.

## 2020-03-21 ENCOUNTER — Ambulatory Visit (INDEPENDENT_AMBULATORY_CARE_PROVIDER_SITE_OTHER): Payer: Medicare HMO | Admitting: Family Medicine

## 2020-03-21 ENCOUNTER — Encounter: Payer: Self-pay | Admitting: Family Medicine

## 2020-03-21 ENCOUNTER — Other Ambulatory Visit: Payer: Self-pay

## 2020-03-21 VITALS — BP 150/73 | HR 67 | Ht 62.0 in | Wt 197.0 lb

## 2020-03-21 DIAGNOSIS — N760 Acute vaginitis: Secondary | ICD-10-CM | POA: Diagnosis not present

## 2020-03-21 DIAGNOSIS — I10 Essential (primary) hypertension: Secondary | ICD-10-CM | POA: Diagnosis not present

## 2020-03-21 DIAGNOSIS — E118 Type 2 diabetes mellitus with unspecified complications: Secondary | ICD-10-CM

## 2020-03-21 LAB — POCT GLYCOSYLATED HEMOGLOBIN (HGB A1C): Hemoglobin A1C: 7.9 % — AB (ref 4.0–5.6)

## 2020-03-21 MED ORDER — FLUCONAZOLE 150 MG PO TABS: 150.0000 mg | ORAL_TABLET | Freq: Every day | ORAL | 1 refills | Status: DC

## 2020-03-21 NOTE — Progress Notes (Signed)
Established Patient Office Visit  Subjective:  Patient ID: Jillian Huynh, female    DOB: 12-20-1945  Age: 74 y.o. MRN: [id number]  CC:  Chief Complaint  Patient presents with  . Hypertension  . Diabetes    HPI Jillian Huynh presents for   Diabetes - no hypoglycemic events. No wounds or sores that are not healing well. No increased thirst or urination. Checking glucose at home. Taking medications as prescribed without any side effects. Hypertension- Pt denies chest pain, SOB, dizziness, or heart palpitations.  Taking meds as directed w/o problems.  Denies medication side effects.  She just had her eye exam done about a month or 2 ago and said she had a good checkup.  She sees Dr. Rosana Hoes in [place].  Hypertension- Pt denies chest pain, SOB, dizziness, or heart palpitations.  Taking meds as directed w/o problems.  Denies medication side effects.       Feels like she has another vaginal yeast infection she has been having some itching for several weeks.  Past Medical History:  Diagnosis Date  . Diabetes mellitus without complication ([place])   . Hyperlipidemia   . Hypertension     Past Surgical History:  Procedure Laterality Date  . CHOLECYSTECTOMY      Family History  Problem Relation Age of Onset  . Diabetes Father   . Cancer Father 5       Esophogeal Ca., Lung.  Marland Kitchen Hypertension Unknown   . Thyroid disease Unknown   . Colon cancer Daughter 61  . Glaucoma Mother   . Hepatitis C Mother 46       Died from.  . Glaucoma Sister   . Glaucoma Sister   . Glaucoma Maternal Aunt     Social History   Socioeconomic History  . Marital status: Married    Spouse name: Biochemist, clinical  . Number of children: 3  . Years of education: college  . Highest education level: Not on file  Occupational History  . Occupation: Hair Stylist  Tobacco Use  . Smoking status: Never Smoker  . Smokeless tobacco: Never Used  Substance and Sexual Activity  . Alcohol use: No    Alcohol/week:  0.0 standard drinks  . Drug use: No  . Sexual activity: Yes    Partners: Male  Other Topics Concern  . Not on file  Social History Narrative   No exercise.     Social Determinants of Health   Financial Resource Strain:   . Difficulty of Paying Living Expenses:   Food Insecurity:   . Worried About Charity fundraiser in the Last Year:   . Arboriculturist in the Last Year:   Transportation Needs:   . Film/video editor (Medical):   Marland Kitchen Lack of Transportation (Non-Medical):   Physical Activity:   . Days of Exercise per Week:   . Minutes of Exercise per Session:   Stress:   . Feeling of Stress :   Social Connections:   . Frequency of Communication with Friends and Family:   . Frequency of Social Gatherings with Friends and Family:   . Attends Religious Services:   . Active Member of Clubs or Organizations:   . Attends Archivist Meetings:   Marland Kitchen Marital Status:   Intimate Partner Violence:   . Fear of Current or Ex-Partner:   . Emotionally Abused:   Marland Kitchen Physically Abused:   . Sexually Abused:     Outpatient Medications Prior to Visit  Medication Sig  Dispense Refill  . atorvastatin (LIPITOR) 10 MG tablet TAKE 1 TABLET BY MOUTH EVERYDAY AT BEDTIME 90 tablet 3  . Calcium Carbonate-Vit D-Min (CALCIUM 1200 PO) Take by mouth.    . Cholecalciferol (VITAMIN D3) 400 UNITS CAPS Take by mouth.    . dapagliflozin propanediol (FARXIGA) 10 MG TABS tablet Take 10 mg by mouth daily before breakfast. 30 tablet 5  . metFORMIN (GLUCOPHAGE-XR) 500 MG 24 hr tablet TAKE 2 TABLETS BY MOUTH EVERY DAY WITH BREAKFAST 180 tablet 1  . rOPINIRole (REQUIP) 3 MG tablet TAKE 1 TABLET (3 MG TOTAL) BY MOUTH DAILY. 90 tablet 1  . sitaGLIPtin (JANUVIA) 100 MG tablet Take 1 tablet (100 mg total) by mouth daily. 90 tablet 1  . vitamin E 100 UNIT capsule Take by mouth daily.    Marland Kitchen losartan (COZAAR) 25 MG tablet TAKE 2 TABLETS BY MOUTH EVERY DAY (Patient not taking: Reported on 03/21/2020) 180 tablet 1    No facility-administered medications prior to visit.    Allergies  Allergen Reactions  . Bydureon [Exenatide] Nausea And Vomiting  . Glipizide Other (See Comments)    Leg cramps, fatigue, and weight gain  . Trulicity [Dulaglutide] Diarrhea  . Invokana [Canagliflozin] Other (See Comments)    Yeast infection    ROS Review of Systems    Objective:    Physical Exam  Constitutional: She is oriented to person, place, and time. She appears well-developed and well-nourished.  HENT:  Head: Normocephalic and atraumatic.  Cardiovascular: Normal rate, regular rhythm and normal heart sounds.  Pulmonary/Chest: Effort normal and breath sounds normal.  Neurological: She is alert and oriented to person, place, and time.  Skin: Skin is warm and dry.  Psychiatric: She has a normal mood and affect. Her behavior is normal.    BP (!) 150/73   Pulse 67   Ht 5\' 2"  (1.575 m)   Wt 197 lb (89.4 kg)   SpO2 95%   BMI 36.03 kg/m  Wt Readings from Last 3 Encounters:  03/21/20 197 lb (89.4 kg)  12/09/19 196 lb (88.9 kg)  08/25/19 208 lb (94.3 kg)     Health Maintenance Due  Topic Date Due  . PNA vac Low Risk Adult (2 of 2 - PPSV23) 12/25/2018    There are no preventive care reminders to display for this patient.  Lab Results  Component Value Date   TSH 1.99 07/09/2018   Lab Results  Component Value Date   WBC 8.1 07/09/2018   HGB 12.3 07/09/2018   HCT 36.8 07/09/2018   MCV 81.8 07/09/2018   PLT 285 07/09/2018   Lab Results  Component Value Date   NA 139 08/25/2019   K 3.8 08/25/2019   CO2 27 08/25/2019   GLUCOSE 162 (H) 08/25/2019   BUN 17 08/25/2019   CREATININE 0.59 (L) 08/25/2019   BILITOT 1.1 08/25/2019   ALKPHOS 78 09/19/2016   AST 15 08/25/2019   ALT 18 08/25/2019   PROT 6.7 08/25/2019   ALBUMIN 4.3 09/19/2016   CALCIUM 10.0 08/25/2019   Lab Results  Component Value Date   CHOL 156 08/25/2019   Lab Results  Component Value Date   HDL 38 (L) 08/25/2019    Lab Results  Component Value Date   LDLCALC 71 08/25/2019   Lab Results  Component Value Date   TRIG 390 (H) 08/25/2019   Lab Results  Component Value Date   CHOLHDL 4.1 08/25/2019   Lab Results  Component Value Date   HGBA1C 7.9 (  A) 03/21/2020      Assessment & Plan:   Problem List Items Addressed This Visit      Cardiovascular and Mediastinum   BP (high blood pressure) - Primary    Controlled.Please restart your losartan for blood pressure and then return in 2 weeks for nurse visit to recheck your blood pressure.        Endocrine   Diabetes mellitus type 2, controlled ([place])    Improved greatly. Down to 7.9. F/U in 3 months.  Continue to work on diet and exercise.       Relevant Orders   POCT glycosylated hemoglobin (Hb A1C) (Completed)    Other Visit Diagnoses    Acute vaginitis         Vaginitis-we will go ahead and treat with Diflucan she wanted to hold off on doing wet prep today but says she is willing to return if the symptoms do not resolve.  Meds ordered this encounter  Medications  . fluconazole (DIFLUCAN) 150 MG tablet    Sig: Take 1 tablet (150 mg total) by mouth daily.    Dispense:  1 tablet    Refill:  1    Follow-up: Return in about 3 months (around 06/21/2020) for Nurse visit for BP check.  restart losartan. , Diabetes follow-up.    Beatrice Lecher, MD

## 2020-03-21 NOTE — Assessment & Plan Note (Signed)
Controlled.Please restart your losartan for blood pressure and then return in 2 weeks for nurse visit to recheck your blood pressure.

## 2020-03-21 NOTE — Assessment & Plan Note (Signed)
Improved greatly. Down to 7.9. F/U in 3 months.  Continue to work on diet and exercise.

## 2020-03-21 NOTE — Patient Instructions (Addendum)
Please restart your losartan for blood pressure and then return in 2 weeks for nurse visit to recheck your blood pressure.

## 2020-04-06 ENCOUNTER — Other Ambulatory Visit: Payer: Self-pay

## 2020-04-06 ENCOUNTER — Ambulatory Visit (INDEPENDENT_AMBULATORY_CARE_PROVIDER_SITE_OTHER): Payer: Medicare HMO | Admitting: Family Medicine

## 2020-04-06 VITALS — BP 135/84 | HR 74 | Temp 98.3°F

## 2020-04-06 DIAGNOSIS — I10 Essential (primary) hypertension: Secondary | ICD-10-CM

## 2020-04-06 MED ORDER — LOSARTAN POTASSIUM 25 MG PO TABS: 12.5000 mg | ORAL_TABLET | Freq: Every day | ORAL | 0 refills | Status: DC

## 2020-04-06 NOTE — Progress Notes (Signed)
Agree with documentation as above.   Mars Scheaffer, MD  

## 2020-04-06 NOTE — Progress Notes (Signed)
Patient presents today as a nurse visit for a blood pressure check.  Patient states she is not taking her medication as prescribed. She states that a prescription was not sent to the pharmacy for her at her last office visit and did not realize that the losartan she had at home was the same thing that Dr. Madilyn Fireman wanted her to restart. Medication and allergy list reviewed with patient and the pharmacy has been verified.   HA: No Dizziness/lightheadedness: No Fever: No BA: No Weakness/Fatigue: No  Sinus pain/pressure: No  Runny nose: No  ST: No  ShOB: No  CP: No  Palps: No Abd pain: No Dysuria: No  N/V/C/D: No    Vital Signs: Blood Pressure: 135/84  Pulse: 74 SpO2: 95%   I spoke with Dr. Madilyn Fireman who advised that the BP is actually pretty good but she still needed to be on the losartan for kidney protection. Per verbal instruction by Dr. Madilyn Fireman, patient advised to start taking losartan 12.5 mg daily. A new prescription for this has been sent in to her pharmacy for her. Pt also advised to keep her follow up appointment with Dr. Madilyn Fireman and the BP will be reevaluated at that time. Patient verbalized understanding.

## 2020-05-02 ENCOUNTER — Telehealth: Payer: Self-pay

## 2020-05-02 NOTE — Telephone Encounter (Signed)
Jillian Huynh is in the doughnut hole. She states she can not afford the [place] or the Januvia.   I did give her the information for [employer] and [employer] for the patient assistance program. I believe she will qualify for the assistance.   [employer] 1-800-AZandME (763)614-4713  [employer] - [phone]   Advised her to call back if there is a problem.

## 2020-05-02 NOTE — Telephone Encounter (Signed)
Agree with documentation as above.   Zaccheaus Storlie, MD  

## 2020-05-12 ENCOUNTER — Other Ambulatory Visit: Payer: Self-pay | Admitting: Family Medicine

## 2020-06-20 ENCOUNTER — Other Ambulatory Visit: Payer: Self-pay

## 2020-06-20 ENCOUNTER — Ambulatory Visit (INDEPENDENT_AMBULATORY_CARE_PROVIDER_SITE_OTHER): Payer: Medicare HMO | Admitting: Family Medicine

## 2020-06-20 ENCOUNTER — Encounter: Payer: Self-pay | Admitting: Family Medicine

## 2020-06-20 VITALS — BP 111/72 | HR 71 | Ht 62.0 in | Wt 199.0 lb

## 2020-06-20 DIAGNOSIS — K760 Fatty (change of) liver, not elsewhere classified: Secondary | ICD-10-CM

## 2020-06-20 DIAGNOSIS — E118 Type 2 diabetes mellitus with unspecified complications: Secondary | ICD-10-CM | POA: Diagnosis not present

## 2020-06-20 DIAGNOSIS — I10 Essential (primary) hypertension: Secondary | ICD-10-CM | POA: Diagnosis not present

## 2020-06-20 LAB — LIPID PANEL W/REFLEX DIRECT LDL
Cholesterol: 174 mg/dL (ref <200)
HDL: 43 mg/dL — ABNORMAL LOW (ref >=50)
LDL Cholesterol (Calc): 81 mg/dL (calc)
Non-HDL Cholesterol (Calc): 131 mg/dL (calc) — ABNORMAL HIGH (ref <130)
Total CHOL/HDL Ratio: 4 (calc) (ref <5.0)
Triglycerides: 376 mg/dL — ABNORMAL HIGH (ref <150)

## 2020-06-20 LAB — COMPLETE METABOLIC PANEL WITH GFR
AG Ratio: 1.9 (calc) (ref 1.0–2.5)
ALT: 17 U/L (ref 6–29)
AST: 17 U/L (ref 10–35)
Albumin: 4.4 g/dL (ref 3.6–5.1)
Alkaline phosphatase (APISO): 71 U/L (ref 37–153)
BUN/Creatinine Ratio: 34 (calc) — ABNORMAL HIGH (ref 6–22)
BUN: 19 mg/dL (ref 7–25)
CO2: 25 mmol/L (ref 20–32)
Calcium: 10.3 mg/dL (ref 8.6–10.4)
Chloride: 104 mmol/L (ref 98–110)
Creat: 0.56 mg/dL — ABNORMAL LOW (ref 0.60–0.93)
GFR, Est African American: 107 mL/min/{1.73_m2} (ref >=60)
GFR, Est Non African American: 93 mL/min/{1.73_m2} (ref >=60)
Globulin: 2.3 g/dL (calc) (ref 1.9–3.7)
Glucose, Bld: 177 mg/dL — ABNORMAL HIGH (ref 65–139)
Potassium: 4.3 mmol/L (ref 3.5–5.3)
Sodium: 137 mmol/L (ref 135–146)
Total Bilirubin: 0.7 mg/dL (ref 0.2–1.2)
Total Protein: 6.7 g/dL (ref 6.1–8.1)

## 2020-06-20 LAB — POCT GLYCOSYLATED HEMOGLOBIN (HGB A1C): Hemoglobin A1C: 7.6 % — AB (ref 4.0–5.6)

## 2020-06-20 NOTE — Assessment & Plan Note (Signed)
Well controlled. Continue current regimen. Follow up in  4 mo 

## 2020-06-20 NOTE — Assessment & Plan Note (Addendum)
On an ACEi and statin.  Uncontrolled though her A1c looks much better today at 7.6 which is down from 7.9 and that is without being able to take her medications consistently because she has been trying to spread them out because of the cost.  So her dietary changes have really made a great improvement.  Encouraged her to just continue to work at that.  One of the medications was denied.  She thinks it was the Januvia but still waiting to hear back on the Farxiga.  She can always start the glipizide may be a half of a tab daily to fill in as she is waiting.  That has always worked well for her but unfortunately has caused some weight gain and increased appetite.  Otherwise I will see her back in 3 months.

## 2020-06-20 NOTE — Progress Notes (Signed)
Established Patient Office Visit  Subjective:  Patient ID: Jillian Huynh, female    DOB: [birth year]  Age: 74 y.o. MRN: [id number]  CC:  Chief Complaint  Patient presents with  . Diabetes    eye exam done faxed to [phone]: [phone]  . Hypertension    HPI Jillian Huynh presents for   Hypertension- Pt denies chest pain, SOB, dizziness, or heart palpitations.  Taking meds as directed w/o problems.  Denies medication side effects.    Diabetes - no hypoglycemic events. No wounds or sores that are not healing well. No increased thirst or urination. Checking glucose at home.  She has been alternating the Januvia and the [place] because of the cost she hit the donut hole.  He says she has been working really hard to improve her diet though she is a little frustrated because she has not lost any weight. \ She also has a few skin lesions on her back that she would like me to look at today.  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  She is now only working 3 days a week instead of 4.   Past Medical History:  Diagnosis Date  . Diabetes mellitus without complication ([place])   . Hyperlipidemia   . Hypertension     Past Surgical History:  Procedure Laterality Date  . CHOLECYSTECTOMY      Family History  Problem Relation Age of Onset  . Diabetes Father   . Cancer Father 35       Esophogeal Ca., Lung.  Marland Kitchen Hypertension Unknown   . Thyroid disease Unknown   . Colon cancer Daughter 71  . Glaucoma Mother   . Hepatitis C Mother 68       Died from.  . Glaucoma Sister   . Glaucoma Sister   . Glaucoma Maternal Aunt     Social History   Socioeconomic History  . Marital status: Married    Spouse name: Biochemist, clinical  . Number of children: 3  . Years of education: college  . Highest education level: Not on file  Occupational History  . Occupation: Hair Stylist  Tobacco Use  . Smoking status: Never Smoker   . Smokeless tobacco: Never Used  Substance and Sexual Activity  . Alcohol use: No    Alcohol/week: 0.0 standard drinks  . Drug use: No  . Sexual activity: Yes    Partners: Male  Other Topics Concern  . Not on file  Social History Narrative   No exercise.     Social Determinants of Health   Financial Resource Strain:   . Difficulty of Paying Living Expenses:   Food Insecurity:   . Worried About Charity fundraiser in the Last Year:   . Arboriculturist in the Last Year:   Transportation Needs:   . Film/video editor (Medical):   Marland Kitchen Lack of Transportation (Non-Medical):   Physical Activity:   . Days of Exercise per Week:   . Minutes of Exercise per Session:   Stress:   . Feeling of Stress :   Social Connections:   . Frequency of Communication with Friends and Family:   . Frequency of Social Gatherings with Friends and Family:   . Attends Religious Services:   . Active Member of Clubs or Organizations:   . Attends Archivist Meetings:   Marland Kitchen Marital Status:   Intimate Partner Violence:   .  Fear of Current or Ex-Partner:   . Emotionally Abused:   Marland Kitchen Physically Abused:   . Sexually Abused:     Outpatient Medications Prior to Visit  Medication Sig Dispense Refill  . atorvastatin (LIPITOR) 10 MG tablet TAKE 1 TABLET BY MOUTH EVERYDAY AT BEDTIME 90 tablet 3  . Calcium Carbonate-Vit D-Min (CALCIUM 1200 PO) Take by mouth.    . Cholecalciferol (VITAMIN D3) 400 UNITS CAPS Take by mouth.    . dapagliflozin propanediol (FARXIGA) 10 MG TABS tablet Take 10 mg by mouth daily before breakfast. 30 tablet 5  . losartan (COZAAR) 25 MG tablet Take 0.5 tablets (12.5 mg total) by mouth daily. 45 tablet 0  . metFORMIN (GLUCOPHAGE-XR) 500 MG 24 hr tablet TAKE 2 TABLETS BY MOUTH EVERY DAY WITH BREAKFAST 180 tablet 1  . rOPINIRole (REQUIP) 3 MG tablet TAKE 1 TABLET (3 MG TOTAL) BY MOUTH DAILY. 90 tablet 1  . sitaGLIPtin (JANUVIA) 100 MG tablet Take 1 tablet (100 mg total) by mouth  daily. 90 tablet 1  . vitamin E 100 UNIT capsule Take by mouth daily.     No facility-administered medications prior to visit.    Allergies  Allergen Reactions  . Bydureon [Exenatide] Nausea And Vomiting  . Glipizide Other (See Comments)    Leg cramps, fatigue, and weight gain  . Trulicity [Dulaglutide] Diarrhea  . Invokana [Canagliflozin] Other (See Comments)    Yeast infection    ROS Review of Systems    Objective:    Physical Exam Constitutional:      Appearance: She is well-developed.  HENT:     Head: Normocephalic and atraumatic.  Cardiovascular:     Rate and Rhythm: Normal rate and regular rhythm.     Heart sounds: Normal heart sounds.  Pulmonary:     Effort: Pulmonary effort is normal.     Breath sounds: Normal breath sounds.  Skin:    General: Skin is warm and dry.  Neurological:     Mental Status: She is alert and oriented to person, place, and time.  Psychiatric:        Behavior: Behavior normal.     BP 111/72   Pulse 71   Ht 5\' 2"  (1.575 m)   Wt 199 lb (90.3 kg)   SpO2 99%   BMI 36.40 kg/m  Wt Readings from Last 3 Encounters:  06/20/20 199 lb (90.3 kg)  03/21/20 197 lb (89.4 kg)  12/09/19 196 lb (88.9 kg)     Health Maintenance Due  Topic Date Due  . OPHTHALMOLOGY EXAM  08/12/2018  . PNA vac Low Risk Adult (2 of 2 - PPSV23) 12/25/2018  . INFLUENZA VACCINE  06/12/2020    There are no preventive care reminders to display for this patient.  Lab Results  Component Value Date   TSH 1.99 07/09/2018   Lab Results  Component Value Date   WBC 8.1 07/09/2018   HGB 12.3 07/09/2018   HCT 36.8 07/09/2018   MCV 81.8 07/09/2018   PLT 285 07/09/2018   Lab Results  Component Value Date   NA 139 08/25/2019   K 3.8 08/25/2019   CO2 27 08/25/2019   GLUCOSE 162 (H) 08/25/2019   BUN 17 08/25/2019   CREATININE 0.59 (L) 08/25/2019   BILITOT 1.1 08/25/2019   ALKPHOS 78 09/19/2016   AST 15 08/25/2019   ALT 18 08/25/2019   PROT 6.7 08/25/2019    ALBUMIN 4.3 09/19/2016   CALCIUM 10.0 08/25/2019   Lab Results  Component Value  Date   CHOL 156 08/25/2019   Lab Results  Component Value Date   HDL 38 (L) 08/25/2019   Lab Results  Component Value Date   LDLCALC 71 08/25/2019   Lab Results  Component Value Date   TRIG 390 (H) 08/25/2019   Lab Results  Component Value Date   CHOLHDL 4.1 08/25/2019   Lab Results  Component Value Date   HGBA1C 7.6 (A) 06/20/2020      Assessment & Plan:   Problem List Items Addressed This Visit      Cardiovascular and Mediastinum   BP (high blood pressure)    Well controlled. Continue current regimen. Follow up in  4 mo      Relevant Orders   COMPLETE METABOLIC PANEL WITH GFR   Lipid Panel w/reflex Direct LDL     Digestive   Fatty liver    Due to recheck liver enzymes its been almost a year.        Endocrine   Diabetes mellitus type 2, controlled ([place]) - Primary    On an ACEi and statin.  Uncontrolled though her A1c looks much better today at 7.6 which is down from 7.9 and that is without being able to take her medications consistently because she has been trying to spread them out because of the cost.  So her dietary changes have really made a great improvement.  Encouraged her to just continue to work at that.  One of the medications was denied.  She thinks it was the Januvia but still waiting to hear back on the Farxiga.  She can always start the glipizide may be a half of a tab daily to fill in as she is waiting.  That has always worked well for her but unfortunately has caused some weight gain and increased appetite.  Otherwise I will see her back in 3 months.      Relevant Orders   POCT glycosylated hemoglobin (Hb A1C) (Completed)   COMPLETE METABOLIC PANEL WITH GFR   Lipid Panel w/reflex Direct LDL      No orders of the defined types were placed in this encounter.   Follow-up: Return in about 3 months (around 09/20/2020) for Diabetes follow-up, Hypertension.     Beatrice Lecher, MD

## 2020-06-20 NOTE — Assessment & Plan Note (Signed)
Due to recheck liver enzymes its been almost a year.

## 2020-06-30 ENCOUNTER — Other Ambulatory Visit: Payer: Self-pay | Admitting: Family Medicine

## 2020-06-30 DIAGNOSIS — I10 Essential (primary) hypertension: Secondary | ICD-10-CM

## 2020-07-19 ENCOUNTER — Other Ambulatory Visit: Payer: Self-pay | Admitting: Family Medicine

## 2020-08-02 DIAGNOSIS — E119 Type 2 diabetes mellitus without complications: Secondary | ICD-10-CM | POA: Diagnosis not present

## 2020-09-19 ENCOUNTER — Encounter: Payer: Self-pay | Admitting: Family Medicine

## 2020-09-19 ENCOUNTER — Ambulatory Visit (INDEPENDENT_AMBULATORY_CARE_PROVIDER_SITE_OTHER): Payer: Medicare HMO | Admitting: Family Medicine

## 2020-09-19 VITALS — BP 130/76 | HR 71 | Ht 62.0 in | Wt 204.0 lb

## 2020-09-19 DIAGNOSIS — K760 Fatty (change of) liver, not elsewhere classified: Secondary | ICD-10-CM

## 2020-09-19 DIAGNOSIS — E118 Type 2 diabetes mellitus with unspecified complications: Secondary | ICD-10-CM | POA: Diagnosis not present

## 2020-09-19 DIAGNOSIS — I10 Essential (primary) hypertension: Secondary | ICD-10-CM

## 2020-09-19 DIAGNOSIS — K219 Gastro-esophageal reflux disease without esophagitis: Secondary | ICD-10-CM

## 2020-09-19 LAB — POCT GLYCOSYLATED HEMOGLOBIN (HGB A1C): Hemoglobin A1C: 8.6 % — AB (ref 4.0–5.6)

## 2020-09-19 NOTE — Progress Notes (Signed)
Established Patient Office Visit  Subjective:  Patient ID: Jillian Huynh, female    DOB: [birth year]  Age: 74 y.o. MRN: [id number]  CC:  Chief Complaint  Patient presents with  . Diabetes    HPI [name] presents for   Hypertension- Pt denies chest pain, SOB, dizziness, or heart palpitations.  Taking meds as directed w/o problems.  Denies medication side effects.    Diabetes - no hypoglycemic events. No wounds or sores that are not healing well. No increased thirst or urination. Checking glucose at home. Taking medications as prescribed without any side effects.  Admits she has not been doing the best with her diet she has been eating a lot of carbs blood sugars has been as high as 200s.  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  Reports that her eye exam is up-to-date.  Also very concerned about the price of her drugs she is now on her Medicare gap.  Also been experiencing more heartburn and reflux recently.  She has not had any major dietary changes.  She does drink caffeinated drinks and does eat more acidic foods like tomatoes etc.    Past Medical History:  Diagnosis Date  . Diabetes mellitus without complication ([place])   . Hyperlipidemia   . Hypertension     Past Surgical History:  Procedure Laterality Date  . CHOLECYSTECTOMY      Family History  Problem Relation Age of Onset  . Diabetes Father   . Cancer Father 56       Esophogeal Ca., Lung.  Marland Kitchen Hypertension Unknown   . Thyroid disease Unknown   . Colon cancer Daughter 73  . Glaucoma Mother   . Hepatitis C Mother 44       Died from.  . Glaucoma Sister   . Glaucoma Sister   . Glaucoma Maternal Aunt     Social History   Socioeconomic History  . Marital status: Married    Spouse name: Biochemist, clinical  . Number of children: 3  . Years of education: college  . Highest education level: Not on file  Occupational History  . Occupation: Hair Stylist  Tobacco Use  . Smoking status: Never Smoker  .  Smokeless tobacco: Never Used  Substance and Sexual Activity  . Alcohol use: No    Alcohol/week: 0.0 standard drinks  . Drug use: No  . Sexual activity: Yes    Partners: Male  Other Topics Concern  . Not on file  Social History Narrative   No exercise.     Social Determinants of Health   Financial Resource Strain:   . Difficulty of Paying Living Expenses: Not on file  Food Insecurity:   . Worried About Charity fundraiser in the Last Year: Not on file  . Ran Out of Food in the Last Year: Not on file  Transportation Needs:   . Lack of Transportation (Medical): Not on file  . Lack of Transportation (Non-Medical): Not on file  Physical Activity:   . Days of Exercise per Week: Not on file  . Minutes of Exercise per Session: Not on file  Stress:   . Feeling of Stress : Not on file  Social Connections:   . Frequency of Communication with Friends and Family: Not on file  . Frequency of Social Gatherings with Friends and Family: Not on file  . Attends Religious Services: Not on file  . Active Member of Clubs or Organizations: Not on file  .  Attends Archivist Meetings: Not on file  . Marital Status: Not on file  Intimate Partner Violence:   . Fear of Current or Ex-Partner: Not on file  . Emotionally Abused: Not on file  . Physically Abused: Not on file  . Sexually Abused: Not on file    Outpatient Medications Prior to Visit  Medication Sig Dispense Refill  . atorvastatin (LIPITOR) 10 MG tablet TAKE 1 TABLET BY MOUTH EVERYDAY AT BEDTIME 90 tablet 3  . Calcium Carbonate-Vit D-Min (CALCIUM 1200 PO) Take by mouth.    . Cholecalciferol (VITAMIN D3) 400 UNITS CAPS Take by mouth.    . losartan (COZAAR) 25 MG tablet TAKE 1/2 TABLET BY MOUTH EVERY DAY 45 tablet 3  . metFORMIN (GLUCOPHAGE-XR) 500 MG 24 hr tablet TAKE 2 TABLETS BY MOUTH EVERY DAY WITH BREAKFAST 180 tablet 1  . rOPINIRole (REQUIP) 3 MG tablet TAKE 1 TABLET (3 MG TOTAL) BY MOUTH DAILY. 90 tablet 1  . vitamin E  100 UNIT capsule Take by mouth daily.    . sitaGLIPtin (JANUVIA) 100 MG tablet Take 1 tablet (100 mg total) by mouth daily. 90 tablet 1  . dapagliflozin propanediol (FARXIGA) 10 MG TABS tablet Take 10 mg by mouth daily before breakfast. 30 tablet 5   No facility-administered medications prior to visit.    Allergies  Allergen Reactions  . Bydureon [Exenatide] Nausea And Vomiting  . Glipizide Other (See Comments)    Leg cramps, fatigue, and weight gain  . Trulicity [Dulaglutide] Diarrhea  . Invokana [Canagliflozin] Other (See Comments)    Yeast infection    ROS Review of Systems    Objective:    Physical Exam Constitutional:      Appearance: She is well-developed.  HENT:     Head: Normocephalic and atraumatic.  Cardiovascular:     Rate and Rhythm: Normal rate and regular rhythm.     Heart sounds: Normal heart sounds.  Pulmonary:     Effort: Pulmonary effort is normal.     Breath sounds: Normal breath sounds.  Skin:    General: Skin is warm and dry.  Neurological:     Mental Status: She is alert and oriented to person, place, and time.  Psychiatric:        Behavior: Behavior normal.     BP 130/76   Pulse 71   Ht 5\' 2"  (1.575 m)   Wt 204 lb (92.5 kg)   SpO2 95%   BMI 37.31 kg/m  Wt Readings from Last 3 Encounters:  09/19/20 204 lb (92.5 kg)  06/20/20 199 lb (90.3 kg)  03/21/20 197 lb (89.4 kg)     Health Maintenance Due  Topic Date Due  . OPHTHALMOLOGY EXAM  08/12/2018  . PNA vac Low Risk Adult (2 of 2 - PPSV23) 12/25/2018  . INFLUENZA VACCINE  Never done  . FOOT EXAM  08/24/2020    There are no preventive care reminders to display for this patient.  Lab Results  Component Value Date   TSH 1.99 07/09/2018   Lab Results  Component Value Date   WBC 8.1 07/09/2018   HGB 12.3 07/09/2018   HCT 36.8 07/09/2018   MCV 81.8 07/09/2018   PLT 285 07/09/2018   Lab Results  Component Value Date   NA 137 06/20/2020   K 4.3 06/20/2020   CO2 25  06/20/2020   GLUCOSE 177 (H) 06/20/2020   BUN 19 06/20/2020   CREATININE 0.56 (L) 06/20/2020   BILITOT 0.7 06/20/2020  ALKPHOS 78 09/19/2016   AST 17 06/20/2020   ALT 17 06/20/2020   PROT 6.7 06/20/2020   ALBUMIN 4.3 09/19/2016   CALCIUM 10.3 06/20/2020   Lab Results  Component Value Date   CHOL 174 06/20/2020   Lab Results  Component Value Date   HDL 43 (L) 06/20/2020   Lab Results  Component Value Date   LDLCALC 81 06/20/2020   Lab Results  Component Value Date   TRIG 376 (H) 06/20/2020   Lab Results  Component Value Date   CHOLHDL 4.0 06/20/2020   Lab Results  Component Value Date   HGBA1C 8.6 (A) 09/19/2020      Assessment & Plan:   Problem List Items Addressed This Visit      Cardiovascular and Mediastinum   BP (high blood pressure)    Well controlled. Continue current regimen. Follow up in  3-4 months        Digestive   Gastroesophageal reflux disease without esophagitis    Encouraged her to cut back on greasy, spicy foods, caffeine etc.  Okay to use Tums as needed.  Okay to use something like Prilosec as needed as well for short period of time to see if this helps.  If not resolving then please let us know.      Fatty liver     Endocrine   Diabetes mellitus type 2, controlled ([place]) - Primary    A1c uncontrolled today and up significantly from previous.  8.6 today, up from previous of 7.6.  She also reports that she did get her eye exam done some more off of [address] I did encourage her to call us back with the name of her eye doctor so that we can get that updated.  Her weight is also up about 4 pounds so really encouraged her to try to get back on track with cutting out the carbs and eating more vegetables.  Last year when she had her Medicare gap we just had to do Metformin and glipizide until January I would really like to try Victoza on her she actually had done well with Ozempic and Trulicity but we get not centering with her skin I  think we did something shorter acting with a smaller amount being injected such as Victoza suspect she would actually do really well and we might be able to do that with just the Metformin instead of 2 more expensive diabetic drugs.      Relevant Orders   POCT glycosylated hemoglobin (Hb A1C) (Completed)      No orders of the defined types were placed in this encounter.  I spent 35 minutes on the day of the encounter to include pre-visit record review, face-to-face time with the patient and post visit ordering of test.   Follow-up: Return in about 3 months (around 12/20/2020) for Diabetes follow-up.      Beatrice Lecher, MD

## 2020-09-19 NOTE — Assessment & Plan Note (Signed)
Encouraged her to cut back on greasy, spicy foods, caffeine etc.  Okay to use Tums as needed.  Okay to use something like Prilosec as needed as well for short period of time to see if this helps.  If not resolving then please let us know.

## 2020-09-19 NOTE — Assessment & Plan Note (Addendum)
A1c uncontrolled today and up significantly from previous.  8.6 today, up from previous of 7.6.  She also reports that she did get her eye exam done some more off of [address] I did encourage her to call us back with the name of her eye doctor so that we can get that updated.  Her weight is also up about 4 pounds so really encouraged her to try to get back on track with cutting out the carbs and eating more vegetables.  Last year when she had her Medicare gap we just had to do Metformin and glipizide until January I would really like to try Victoza on her she actually had done well with Ozempic and Trulicity but we get not centering with her skin I think we did something shorter acting with a smaller amount being injected such as Victoza suspect she would actually do really well and we might be able to do that with just the Metformin instead of 2 more expensive diabetic drugs.

## 2020-09-19 NOTE — Progress Notes (Signed)
Pt reports that she is "hungry all the time" she also complains of having indigestion believes it's from eating meat. She stated that she tries to do more grilled meat.

## 2020-09-19 NOTE — Patient Instructions (Signed)
Can see if your new plan will cover Victoza, daily injectable for next year.  When you run out of your medicine please restart your glipizide.

## 2020-09-19 NOTE — Assessment & Plan Note (Signed)
Well controlled. Continue current regimen. Follow up in  3-4 months.  

## 2020-10-10 ENCOUNTER — Telehealth: Payer: Self-pay

## 2020-10-10 MED ORDER — VICTOZA 18 MG/3ML ~~LOC~~ SOPN
PEN_INJECTOR | SUBCUTANEOUS | 0 refills | Status: DC
Start: 2020-10-10 — End: 2020-11-18

## 2020-10-10 NOTE — Telephone Encounter (Signed)
Jillian Huynh states [employer] will pay for [employer].

## 2020-10-10 NOTE — Telephone Encounter (Signed)
Ok, new rx sent to pharmacy

## 2020-10-19 ENCOUNTER — Other Ambulatory Visit: Payer: Self-pay

## 2020-10-19 DIAGNOSIS — E118 Type 2 diabetes mellitus with unspecified complications: Secondary | ICD-10-CM

## 2020-10-19 MED ORDER — PEN NEEDLES 30G X 8 MM MISC: 99 refills | Status: DC

## 2020-10-27 DIAGNOSIS — E119 Type 2 diabetes mellitus without complications: Secondary | ICD-10-CM | POA: Diagnosis not present

## 2020-11-09 ENCOUNTER — Other Ambulatory Visit: Payer: Self-pay | Admitting: Family Medicine

## 2020-11-18 ENCOUNTER — Other Ambulatory Visit: Payer: Self-pay | Admitting: Family Medicine

## 2020-12-17 ENCOUNTER — Other Ambulatory Visit: Payer: Self-pay | Admitting: Family Medicine

## 2020-12-26 ENCOUNTER — Encounter: Payer: Self-pay | Admitting: Family Medicine

## 2020-12-26 ENCOUNTER — Ambulatory Visit (INDEPENDENT_AMBULATORY_CARE_PROVIDER_SITE_OTHER): Payer: Medicare HMO | Admitting: Family Medicine

## 2020-12-26 ENCOUNTER — Other Ambulatory Visit: Payer: Self-pay

## 2020-12-26 VITALS — BP 138/68 | HR 90 | Ht 62.0 in | Wt 197.0 lb

## 2020-12-26 DIAGNOSIS — R110 Nausea: Secondary | ICD-10-CM

## 2020-12-26 DIAGNOSIS — I10 Essential (primary) hypertension: Secondary | ICD-10-CM | POA: Diagnosis not present

## 2020-12-26 DIAGNOSIS — K219 Gastro-esophageal reflux disease without esophagitis: Secondary | ICD-10-CM

## 2020-12-26 DIAGNOSIS — E118 Type 2 diabetes mellitus with unspecified complications: Secondary | ICD-10-CM

## 2020-12-26 LAB — POCT GLYCOSYLATED HEMOGLOBIN (HGB A1C): Hemoglobin A1C: 7.8 % — AB (ref 4.0–5.6)

## 2020-12-26 MED ORDER — DAPAGLIFLOZIN PROPANEDIOL 10 MG PO TABS: 10.0000 mg | ORAL_TABLET | Freq: Every day | ORAL | 5 refills | Status: DC

## 2020-12-26 MED ORDER — PANTOPRAZOLE SODIUM 40 MG PO TBEC: 40.0000 mg | DELAYED_RELEASE_TABLET | Freq: Every day | ORAL | 3 refills | Status: DC

## 2020-12-26 NOTE — Assessment & Plan Note (Addendum)
A1c is improved, down to 7.8.  Reminded her goal to get this less than 7 if possible.  Added Victoza to intolerance list.  She would like to go back to [place] which she has taken previously and actually did okay with.  Unfortunately she has had several diabetic drugs that she has not tolerated well.  She has lost 7 pounds but again most likely secondary to nausea and decreased p.o. intake over the last 3 months.  I would like to check a lipase just to make sure that she does not have some low-level pancreatitis going on.  If she is not feeling significantly better in 1 week off of the Victoza then please call me back and let me know.  Continue with Metformin.

## 2020-12-26 NOTE — Patient Instructions (Signed)
Please stop your Victoza.

## 2020-12-26 NOTE — Assessment & Plan Note (Signed)
BP Is Ok today. She is down 7 lbs but mostly secondary to nausea.

## 2020-12-26 NOTE — Assessment & Plan Note (Signed)
Reflux has been worse since starting the Victoza.  We will send over prescription for PPI to take for least a short period of time.

## 2020-12-26 NOTE — Progress Notes (Signed)
Established Patient Office Visit  Subjective:  Patient ID: Jillian Huynh, female    DOB: [birth year]  Age: 75 y.o. MRN: [id number]  CC:  Chief Complaint  Patient presents with  . Hypertension  . Diabetes    HPI Jillian Huynh presents for   Diabetes - no hypoglycemic events. No wounds or sores that are not healing well. No increased thirst or urination. Checking glucose at home.  She reports that the Victoza is making her feel more constipated, nauseated and bloated and belching more than usual.  He says she wakes up every day feeling nauseated.  She occasionally will actually wake up and vomit in the middle of the night.  She still on her Metformin.  Hypertension- Pt denies chest pain, SOB, dizziness, or heart palpitations.  Taking meds as directed w/o problems.  Denies medication side effects.       Past Medical History:  Diagnosis Date  . Diabetes mellitus without complication ([place])   . Hyperlipidemia   . Hypertension     Past Surgical History:  Procedure Laterality Date  . CHOLECYSTECTOMY      Family History  Problem Relation Age of Onset  . Diabetes Father   . Cancer Father 69       Esophogeal Ca., Lung.  Marland Kitchen Hypertension Unknown   . Thyroid disease Unknown   . Colon cancer Daughter 62  . Glaucoma Mother   . Hepatitis C Mother 12       Died from.  . Glaucoma Sister   . Glaucoma Sister   . Glaucoma Maternal Aunt     Social History   Socioeconomic History  . Marital status: Married    Spouse name: Biochemist, clinical  . Number of children: 3  . Years of education: college  . Highest education level: Not on file  Occupational History  . Occupation: Hair Stylist  Tobacco Use  . Smoking status: Never Smoker  . Smokeless tobacco: Never Used  Substance and Sexual Activity  . Alcohol use: No    Alcohol/week: 0.0 standard drinks  . Drug use: No  . Sexual activity: Yes    Partners: Male  Other Topics Concern  . Not on file  Social History Narrative   No  exercise.     Social Determinants of Health   Financial Resource Strain: Not on file  Food Insecurity: Not on file  Transportation Needs: Not on file  Physical Activity: Not on file  Stress: Not on file  Social Connections: Not on file  Intimate Partner Violence: Not on file    Outpatient Medications Prior to Visit  Medication Sig Dispense Refill  . atorvastatin (LIPITOR) 10 MG tablet TAKE 1 TABLET BY MOUTH EVERYDAY AT BEDTIME 90 tablet 3  . B-D ULTRAFINE III SHORT PEN 31G X 8 MM MISC SMARTSIG:1 Each SUB-Q Daily    . Calcium Carbonate-Vit D-Min (CALCIUM 1200 PO) Take by mouth.    . Cholecalciferol (VITAMIN D3) 400 UNITS CAPS Take by mouth.    . losartan (COZAAR) 25 MG tablet TAKE 1/2 TABLET BY MOUTH EVERY DAY 45 tablet 3  . metFORMIN (GLUCOPHAGE-XR) 500 MG 24 hr tablet TAKE 2 TABLETS BY MOUTH EVERY DAY WITH BREAKFAST 180 tablet 1  . rOPINIRole (REQUIP) 3 MG tablet TAKE 1 TABLET BY MOUTH DAILY 90 tablet 1  . vitamin E 100 UNIT capsule Take by mouth daily.    . Insulin Pen Needle (PEN NEEDLES) 30G X 8 MM MISC Inject into skin once weekly. 90 each prn  .  VICTOZA 18 MG/3ML SOPN INJECT 1.2 MG UNDER THE SKIN ONCE DAILY 6 mL 0   No facility-administered medications prior to visit.    Allergies  Allergen Reactions  . Bydureon [Exenatide] Nausea And Vomiting  . Glipizide Other (See Comments)    Leg cramps, fatigue, and weight gain  . Trulicity [Dulaglutide] Diarrhea  . Invokana [Canagliflozin] Other (See Comments)    Yeast infection  . Victoza [Liraglutide] Nausea Only and Other (See Comments)    Constipation,belching,bloating    ROS Review of Systems    Objective:    Physical Exam Constitutional:      Appearance: She is well-developed and well-nourished.  HENT:     Head: Normocephalic and atraumatic.  Cardiovascular:     Rate and Rhythm: Normal rate and regular rhythm.     Heart sounds: Normal heart sounds.  Pulmonary:     Effort: Pulmonary effort is normal.      Breath sounds: Normal breath sounds.  Skin:    General: Skin is warm and dry.  Neurological:     Mental Status: She is alert and oriented to person, place, and time.  Psychiatric:        Mood and Affect: Mood and affect normal.        Behavior: Behavior normal.     BP 138/68   Pulse 90   Ht 5\' 2"  (1.575 m)   Wt 197 lb (89.4 kg)   SpO2 100%   BMI 36.03 kg/m  Wt Readings from Last 3 Encounters:  12/26/20 197 lb (89.4 kg)  09/19/20 204 lb (92.5 kg)  06/20/20 199 lb (90.3 kg)     Health Maintenance Due  Topic Date Due  . OPHTHALMOLOGY EXAM  08/12/2018  . PNA vac Low Risk Adult (2 of 2 - PPSV23) 12/25/2018  . INFLUENZA VACCINE  Never done  . COVID-19 Vaccine (3 - Booster for Moderna series) 08/13/2020  . FOOT EXAM  08/24/2020  . MAMMOGRAM  09/24/2020    There are no preventive care reminders to display for this patient.  Lab Results  Component Value Date   TSH 1.99 07/09/2018   Lab Results  Component Value Date   WBC 8.1 07/09/2018   HGB 12.3 07/09/2018   HCT 36.8 07/09/2018   MCV 81.8 07/09/2018   PLT 285 07/09/2018   Lab Results  Component Value Date   NA 137 06/20/2020   K 4.3 06/20/2020   CO2 25 06/20/2020   GLUCOSE 177 (H) 06/20/2020   BUN 19 06/20/2020   CREATININE 0.56 (L) 06/20/2020   BILITOT 0.7 06/20/2020   ALKPHOS 78 09/19/2016   AST 17 06/20/2020   ALT 17 06/20/2020   PROT 6.7 06/20/2020   ALBUMIN 4.3 09/19/2016   CALCIUM 10.3 06/20/2020   Lab Results  Component Value Date   CHOL 174 06/20/2020   Lab Results  Component Value Date   HDL 43 (L) 06/20/2020   Lab Results  Component Value Date   LDLCALC 81 06/20/2020   Lab Results  Component Value Date   TRIG 376 (H) 06/20/2020   Lab Results  Component Value Date   CHOLHDL 4.0 06/20/2020   Lab Results  Component Value Date   HGBA1C 7.8 (A) 12/26/2020      Assessment & Plan:   Problem List Items Addressed This Visit      Cardiovascular and Mediastinum   BP (high blood  pressure)    BP Is Ok today. She is down 7 lbs but mostly secondary to nausea.  Digestive   Gastroesophageal reflux disease without esophagitis    Reflux has been worse since starting the Victoza.  We will send over prescription for PPI to take for least a short period of time.      Relevant Medications   pantoprazole (PROTONIX) 40 MG tablet   Other Relevant Orders   COMPLETE METABOLIC PANEL WITH GFR   Lipase   CBC     Endocrine   Diabetes mellitus type 2, controlled ([place]) - Primary    A1c is improved, down to 7.8.  Reminded her goal to get this less than 7 if possible.  Added Victoza to intolerance list.  She would like to go back to [place] which she has taken previously and actually did okay with.  Unfortunately she has had several diabetic drugs that she has not tolerated well.  She has lost 7 pounds but again most likely secondary to nausea and decreased p.o. intake over the last 3 months.  I would like to check a lipase just to make sure that she does not have some low-level pancreatitis going on.  If she is not feeling significantly better in 1 week off of the Victoza then please call me back and let me know.  Continue with Metformin.      Relevant Medications   dapagliflozin propanediol (FARXIGA) 10 MG TABS tablet   Other Relevant Orders   POCT glycosylated hemoglobin (Hb A1C) (Completed)    Other Visit Diagnoses    Nausea       Relevant Orders   COMPLETE METABOLIC PANEL WITH GFR   Lipase   CBC      Meds ordered this encounter  Medications  . pantoprazole (PROTONIX) 40 MG tablet    Sig: Take 1 tablet (40 mg total) by mouth daily.    Dispense:  30 tablet    Refill:  3  . dapagliflozin propanediol (FARXIGA) 10 MG TABS tablet    Sig: Take 1 tablet (10 mg total) by mouth daily before breakfast.    Dispense:  30 tablet    Refill:  5    Follow-up: Return in about 4 weeks (around 01/23/2021) for Nausea and stomach ache.    Beatrice Lecher, MD

## 2020-12-27 LAB — CBC
HCT: 37.8 % (ref 35.0–45.0)
Hemoglobin: 12.7 g/dL (ref 11.7–15.5)
MCH: 28 pg (ref 27.0–33.0)
MCHC: 33.6 g/dL (ref 32.0–36.0)
MCV: 83.3 fL (ref 80.0–100.0)
MPV: 10.9 fL (ref 7.5–12.5)
Platelets: 254 10*3/uL (ref 140–400)
RBC: 4.54 10*6/uL (ref 3.80–5.10)
RDW: 14.6 % (ref 11.0–15.0)
WBC: 6.7 10*3/uL (ref 3.8–10.8)

## 2020-12-27 LAB — COMPLETE METABOLIC PANEL WITH GFR
AG Ratio: 2 (calc) (ref 1.0–2.5)
ALT: 34 U/L — ABNORMAL HIGH (ref 6–29)
AST: 22 U/L (ref 10–35)
Albumin: 4.4 g/dL (ref 3.6–5.1)
Alkaline phosphatase (APISO): 76 U/L (ref 37–153)
BUN/Creatinine Ratio: 22 (calc) (ref 6–22)
BUN: 13 mg/dL (ref 7–25)
CO2: 26 mmol/L (ref 20–32)
Calcium: 10 mg/dL (ref 8.6–10.4)
Chloride: 102 mmol/L (ref 98–110)
Creat: 0.59 mg/dL — ABNORMAL LOW (ref 0.60–0.93)
GFR, Est African American: 105 mL/min/{1.73_m2} (ref >=60)
GFR, Est Non African American: 90 mL/min/{1.73_m2} (ref >=60)
Globulin: 2.2 g/dL (calc) (ref 1.9–3.7)
Glucose, Bld: 190 mg/dL — ABNORMAL HIGH (ref 65–139)
Potassium: 4.1 mmol/L (ref 3.5–5.3)
Sodium: 139 mmol/L (ref 135–146)
Total Bilirubin: 1.2 mg/dL (ref 0.2–1.2)
Total Protein: 6.6 g/dL (ref 6.1–8.1)

## 2020-12-27 LAB — LIPASE: Lipase: 10 U/L (ref 7–60)

## 2021-01-10 ENCOUNTER — Other Ambulatory Visit: Payer: Self-pay | Admitting: Family Medicine

## 2021-01-12 ENCOUNTER — Telehealth: Payer: Self-pay

## 2021-01-12 NOTE — Telephone Encounter (Signed)
If she still taking the metformin, 2 tabs daily in addition to the [place]?  That just seems really unusual and does not really make a lot of sense.  We can have her hold it for 2 days and see if her sugars come down or if they actually stay about the same.  We may need to put her back on the insulin shot for a period of time.

## 2021-01-12 NOTE — Telephone Encounter (Signed)
Jillian Huynh states since she has started the [place] her fasting blood sugar has been 300 mg/dl. Please advise.   She states she has been trying to eat better.   Breakfast - eggs or oatmeal or toast Lunch - soup with crackers or half a sandwich Snacks - apples or banana Dinner - protein and a vegetable sometimes potatoes

## 2021-01-13 NOTE — Telephone Encounter (Signed)
Called pt and informed her of recommendations. She will let us know what her blood sugar numbers are during the 2 day Farxiga hold.

## 2021-01-16 MED ORDER — TRESIBA FLEXTOUCH 100 UNIT/ML ~~LOC~~ SOPN: 10.0000 [IU] | PEN_INJECTOR | Freq: Every day | SUBCUTANEOUS | 1 refills | Status: DC

## 2021-01-16 NOTE — Telephone Encounter (Signed)
Restart long-acting insulin.  Prescription sent to pharmacy for [place].  Give 10 units this evening and go up by 2 units daily until blood sugar under 160.  Then she can stay at that dose for a week and see how her sugars are doing.

## 2021-01-16 NOTE — Telephone Encounter (Signed)
Pt called back as instructed after she stopped her Diabetic Medication for 2 days and her blood sugars are still running the same as they did with and without the meds, around 300. She feels this shows the meds was not working at all. Please call patient back with what you would like for her to do at this point. Thanks

## 2021-01-16 NOTE — Telephone Encounter (Signed)
Called and advised of recommendations.

## 2021-01-23 ENCOUNTER — Ambulatory Visit: Payer: Medicare HMO | Admitting: Family Medicine

## 2021-01-30 ENCOUNTER — Encounter: Payer: Self-pay | Admitting: Family Medicine

## 2021-01-30 ENCOUNTER — Ambulatory Visit (INDEPENDENT_AMBULATORY_CARE_PROVIDER_SITE_OTHER): Payer: Medicare HMO | Admitting: Family Medicine

## 2021-01-30 ENCOUNTER — Other Ambulatory Visit: Payer: Self-pay

## 2021-01-30 VITALS — BP 132/80 | HR 71 | Ht 62.0 in | Wt 195.0 lb

## 2021-01-30 DIAGNOSIS — E118 Type 2 diabetes mellitus with unspecified complications: Secondary | ICD-10-CM

## 2021-01-30 DIAGNOSIS — Z723 Lack of physical exercise: Secondary | ICD-10-CM

## 2021-01-30 DIAGNOSIS — Z0000 Encounter for general adult medical examination without abnormal findings: Secondary | ICD-10-CM

## 2021-01-30 DIAGNOSIS — Z780 Asymptomatic menopausal state: Secondary | ICD-10-CM | POA: Diagnosis not present

## 2021-01-30 DIAGNOSIS — Z13820 Encounter for screening for osteoporosis: Secondary | ICD-10-CM | POA: Diagnosis not present

## 2021-01-30 DIAGNOSIS — Z1231 Encounter for screening mammogram for malignant neoplasm of breast: Secondary | ICD-10-CM | POA: Diagnosis not present

## 2021-01-30 DIAGNOSIS — Z733 Stress, not elsewhere classified: Secondary | ICD-10-CM | POA: Diagnosis not present

## 2021-01-30 DIAGNOSIS — I10 Essential (primary) hypertension: Secondary | ICD-10-CM | POA: Diagnosis not present

## 2021-01-30 NOTE — Assessment & Plan Note (Signed)
Really has made a commitment to get back on track with her diet and was staying active.

## 2021-01-30 NOTE — Assessment & Plan Note (Signed)
Reviewed with her how to gradually increase her insulin want her to go by 2 units daily until her fasting blood sugars are less than 160 in the morning and then I want her to stay at that dose sheet.  She has been giving her long-acting insulin in the morning which is perfectly fine.  She is continuing to monitor her glucose levels to try to get a sense of what is actually elevating her glucose.  She found out that crackers will elevate her sugar especially if she eats 7-8 of them.  I would like to have her come back in about a month to see how she is doing and if she is making any progress.

## 2021-01-30 NOTE — Progress Notes (Addendum)
MEDICARE ANNUAL WELLNESS VISIT  01/30/2021  Subjective:  Gilbert Narain is a 75 y.o. female patient of Metheney, Rene Kocher, MD who had a Medicare Annual Wellness Visit today. Jayden is Working full time and lives with their spouse. she has 3 children. she reports that she is socially active and does interact with friends/family regularly. she is minimally physically active and enjoys reading, music and watching television.  Patient Care Team: [name], MD as PCP - General (Family Medicine)  Advanced Directives 01/30/2021 12/26/2016 03/28/2015  Does Patient Have a Medical Advance Directive? Yes Yes Yes  Type of Paramedic of [place];Living will [place];Living will Living will  Does patient want to make changes to medical advance directive? No - Patient declined No - Patient declined No - Patient declined  Copy of [place] in Chart? No - copy requested No - copy requested No - copy requested    Hospital Utilization Over the Past 12 Months: # of hospitalizations or ER visits: 0 # of surgeries: 0  Review of Systems    Patient reports that her overall health is worse when compared to last year.  Review of Systems: History obtained from chart review and the patient  All other systems negative.  Pain Assessment Pain : No/denies pain     Current Medications & Allergies (verified) Allergies as of 01/30/2021      Reactions   Bydureon [exenatide] Nausea And Vomiting   Glipizide Other (See Comments)   Leg cramps, fatigue, and weight gain   Trulicity [dulaglutide] Diarrhea   Invokana [canagliflozin] Other (See Comments)   Yeast infection   Victoza [liraglutide] Nausea Only, Other (See Comments)   Constipation,belching,bloating      Medication List       Accurate as of January 30, 2021  2:51 PM. If you have any questions, ask your nurse or doctor.        atorvastatin 10 MG tablet Commonly known as:  LIPITOR TAKE 1 TABLET BY MOUTH EVERYDAY AT BEDTIME   B-D ULTRAFINE III SHORT PEN 31G X 8 MM Misc Generic drug: Insulin Pen Needle SMARTSIG:1 Each SUB-Q Daily   CALCIUM 1200 PO Take by mouth.   dapagliflozin propanediol 10 MG Tabs tablet Commonly known as: Farxiga Take 1 tablet (10 mg total) by mouth daily before breakfast.   losartan 25 MG tablet Commonly known as: COZAAR TAKE 1/2 TABLET BY MOUTH EVERY DAY   metFORMIN 500 MG 24 hr tablet Commonly known as: GLUCOPHAGE-XR TAKE 2 TABLETS BY MOUTH EVERY DAY WITH BREAKFAST   pantoprazole 40 MG tablet Commonly known as: PROTONIX Take 1 tablet (40 mg total) by mouth daily.   PROBIOTIC PO Take by mouth.   rOPINIRole 3 MG tablet Commonly known as: REQUIP TAKE 1 TABLET BY MOUTH DAILY   Tresiba FlexTouch 100 UNIT/ML FlexTouch Pen Generic drug: insulin degludec Inject 10-25 Units into the skin daily.   Vitamin D3 10 MCG (400 UNIT) Caps Take by mouth.   vitamin E 45 MG (100 UNITS) capsule Take 200 Units by mouth daily.       History (reviewed): Past Medical History:  Diagnosis Date  . Diabetes mellitus without complication ([place])   . Hyperlipidemia   . Hypertension    Past Surgical History:  Procedure Laterality Date  . CHOLECYSTECTOMY     Family History  Problem Relation Age of Onset  . Diabetes Father   . Cancer Father 27       Esophogeal Ca.,  Lung.  . Hypertension Other   . Thyroid disease Other   . Colon cancer Daughter 30  . Glaucoma Mother   . Hepatitis C Mother 10       Died from.  . Glaucoma Sister   . Glaucoma Sister   . Glaucoma Maternal Aunt    Social History   Socioeconomic History  . Marital status: Married    Spouse name: Biochemist, clinical  . Number of children: 3  . Years of education: 16.5  . Highest education level: Some college, no degree  Occupational History  . Occupation: Hair Stylist  Tobacco Use  . Smoking status: Never Smoker  . Smokeless tobacco: Never Used  Substance and  Sexual Activity  . Alcohol use: No    Alcohol/week: 0.0 standard drinks  . Drug use: No  . Sexual activity: Yes    Partners: Male  Other Topics Concern  . Not on file  Social History Narrative   Lives with her husband. She is working full time as a Probation officer. Two of her children live close by. One is in [place]. One of her daughter's is deceased. Her hobbies include watch tv, listening to music and reading.    Social Determinants of Health   Financial Resource Strain: Low Risk   . Difficulty of Paying Living Expenses: Not hard at all  Food Insecurity: No Food Insecurity  . Worried About Charity fundraiser in the Last Year: Never true  . Ran Out of Food in the Last Year: Never true  Transportation Needs: No Transportation Needs  . Lack of Transportation (Medical): No  . Lack of Transportation (Non-Medical): No  Physical Activity: Inactive  . Days of Exercise per Week: 0 days  . Minutes of Exercise per Session: 0 min  Stress: Stress Concern Present  . Feeling of Stress : To some extent  Social Connections: Moderately Integrated  . Frequency of Communication with Friends and Family: More than three times a week  . Frequency of Social Gatherings with Friends and Family: Once a week  . Attends Religious Services: Never  . Active Member of Clubs or Organizations: Yes  . Attends Archivist Meetings: More than 4 times per year  . Marital Status: Married    Activities of Daily Living In your present state of health, do you have any difficulty performing the following activities: 01/30/2021  Hearing? Y  Comment sometimes.  Vision? N  Difficulty concentrating or making decisions? N  Walking or climbing stairs? N  Dressing or bathing? N  Doing errands, shopping? N  Preparing Food and eating ? N  Using the Toilet? N  In the past six months, have you accidently leaked urine? N  Do you have problems with loss of bowel control? N  Managing your Medications? N  Managing  your Finances? N  Housekeeping or managing your Housekeeping? N  Some recent data might be hidden    Patient Education/Literacy How often do you need to have someone help you when you read instructions, pamphlets, or other written materials from your doctor or pharmacy?: 1 - Never What is the last grade level you completed in school?: High school diploma. 3 years of college. [employer].  Exercise Current Exercise Habits: The patient does not participate in regular exercise at present, Exercise limited by: None identified  Diet Patient reports consuming 3 meals a day and 3-4 snack(s) a day Patient reports that her primary diet is: Diabetic Patient reports that she does have regular  access to food.   Depression Screen PHQ 2/9 Scores 01/30/2021 09/19/2020 08/25/2019 02/18/2019 10/08/2018 07/09/2018 09/25/2017  PHQ - 2 Score 0 0 0 0 1 4 0  PHQ- 9 Score - - - - - 13 -     Fall Risk Fall Risk  01/30/2021 12/26/2020 12/09/2019 10/08/2018 09/25/2017  Falls in the past year? 0 0 0 1 No  Number falls in past yr: 0 - 0 0 -  Injury with Fall? 0 - 0 0 -  Risk for fall due to : No Fall Risks No Fall Risks - Other (Comment) -  Risk for fall due to: Comment - - - grease on floor -  Follow up Falls evaluation completed - - Falls prevention discussed -     Objective:   BP 132/80   Pulse 71   Ht 5\' 2"  (1.575 m)   Wt 195 lb (88.5 kg)   SpO2 97%   BMI 35.67 kg/m   Last Weight  Most recent update: 01/30/2021  2:51 PM   Weight  88.5 kg (195 lb)            Body mass index is 35.67 kg/m.  Hearing/Vision  . Ewelina did not have difficulty with hearing/understanding during the face-to-face interview . Amparo did not have difficulty with her vision during the face-to-face interview . Reports that she has not had a formal eye exam by an eye care professional within the past year . Reports that she has not had a formal hearing evaluation within the past year  Cognitive Function: 6CIT Screen  01/30/2021 12/26/2016  What Year? 0 points 0 points  What month? 0 points 0 points  What time? 0 points 0 points  Count back from 20 2 points 0 points  Months in reverse 0 points 0 points  Repeat phrase 2 points 0 points  Total Score 4 0    Normal Cognitive Function Screening: Yes (Normal:0-7, Significant for Dysfunction: >8)  Immunization & Health Maintenance Record Immunization History  Administered Date(s) Administered  . Moderna Sars-Covid-2 Vaccination 01/06/2020, 02/12/2020  . Pneumococcal Conjugate-13 12/25/2017  . Tdap 03/26/2007, 06/29/2015    Health Maintenance  Topic Date Due  . INFLUENZA VACCINE  02/09/2021 (Originally 06/12/2020)  . OPHTHALMOLOGY EXAM  02/10/2021 (Originally 08/12/2018)  . COVID-19 Vaccine (3 - Booster for Moderna series) 02/15/2021 (Originally 08/13/2020)  . FOOT EXAM  05/09/2021 (Originally 08/24/2020)  . MAMMOGRAM  01/30/2022 (Originally 09/24/2020)  . PNA vac Low Risk Adult (2 of 2 - PPSV23) 01/30/2022 (Originally 12/25/2018)  . HEMOGLOBIN A1C  06/25/2021  . COLONOSCOPY (Pts 45-64yrs Insurance coverage will need to be confirmed)  12/29/2022  . TETANUS/TDAP  06/28/2025  . DEXA SCAN  Completed  . Hepatitis C Screening  Completed  . HPV VACCINES  Aged Out       Assessment  This is a routine wellness examination for [employer].  Health Maintenance: Due or Overdue There are no preventive care reminders to display for this patient.  Rosalita Chessman does not need a referral for Community Assistance: Care Management:   no Social Work:    no Prescription Assistance:  no Nutrition/Diabetes Education:  no   Plan:  Personalized Goals Goals Addressed              This Visit's Progress   .  Patient Stated (pt-stated)        01/30/2021 AWV Goal: Diabetes Management  . Patient will maintain an A1C level below 8.0 . Patient will not develop any  diabetic foot complications . Patient will not experience any hypoglycemic episodes over the next 3  months . Patient will notify our office of any CBG readings outside of the provider recommended range by calling [phone] . Patient will adhere to provider recommendations for diabetes management  Patient Self Management Activities . take all medications as prescribed and report any negative side effects . monitor and record blood sugar readings as directed . adhere to a low carbohydrate diet that incorporates lean proteins, vegetables, whole grains, low glycemic fruits . check feet daily noting any sores, cracks, injuries, or callous formations . see PCP or podiatrist if she notices any changes in her legs, feet, or toenails . Patient will visit PCP and have an A1C level checked every 3 to 6 months as directed  . have a yearly eye exam to monitor for vascular changes associated with diabetes and will request that the report be sent to her pcp.  . consult with her PCP regarding any changes in her health or new or worsening symptoms       Personalized Health Maintenance & Screening Recommendations  Pneumococcal vaccine - patient refused. Influenza vaccine - patient refused Screening Mammography Eye exam - patient has an appointment at the end of this month. Shingrix vaccine - patient refused   Lung Cancer Screening Recommended: no (Low Dose CT Chest recommended if Age 62-80 years, 30 pack-year currently smoking OR have quit w/in past 15 years) Hepatitis C Screening recommended: no HIV Screening recommended: no  Advanced Directives: Written information was not given per the patient's request.  Referrals & Orders Orders Placed This Encounter  Procedures  . Mammogram 3D SCREEN BREAST BILATERAL  . [place]    Follow-up Plan . Follow-up with [name], MD as planned . Referral has been sent for your mammogram and Dexa scan.  . Medicare wellness in one year.   I have personally reviewed and noted the following in the patient's chart:   . Medical and social  history . Use of alcohol, tobacco or illicit drugs  . Current medications and supplements . Functional ability and status . Nutritional status . Physical activity . Advanced directives . List of other physicians . Hospitalizations, surgeries, and ER visits in previous 12 months . Vitals . Screenings to include cognitive, depression, and falls . Referrals and appointments  In addition, I have reviewed and discussed with patient certain preventive protocols, quality metrics, and best practice recommendations. A written personalized care plan for preventive services as well as general preventive health recommendations were provided to patient.     Tinnie Gens, RN  01/30/2021

## 2021-01-30 NOTE — Progress Notes (Signed)
Established Patient Office Visit  Subjective:  Patient ID: Jillian Huynh, female    DOB: 1946/01/24  Age: 75 y.o. MRN: [id number]  CC:  Chief Complaint  Patient presents with  . Follow-up    HPI Jillian Huynh presents for Nausea and stomach ache.  Unfortunately the Victoza was causing her to feel more constipated nauseated and bloated bloated.  She was also experiencing excess belching she was waking up feeling nauseated every day. We changed her back to long acting insluin.   Sugars are still running in the 300s.    Follow-up diabetes-she got nervous about starting insulin so just dated 6 units she did not go up to 10.  He did take an old glipizide when her sugars were really elevated because she said she did not know what else to do it was causing frequent urination in fact she was getting up the most every 1/2 hour at night to urinate when her sugar was 380.  Past Medical History:  Diagnosis Date  . Diabetes mellitus without complication ([place])   . Hyperlipidemia   . Hypertension     Past Surgical History:  Procedure Laterality Date  . CHOLECYSTECTOMY      Family History  Problem Relation Age of Onset  . Diabetes Father   . Cancer Father 110       Esophogeal Ca., Lung.  Marland Kitchen Hypertension Other   . Thyroid disease Other   . Colon cancer Daughter 73  . Glaucoma Mother   . Hepatitis C Mother 72       Died from.  . Glaucoma Sister   . Glaucoma Sister   . Glaucoma Maternal Aunt     Social History   Socioeconomic History  . Marital status: Married    Spouse name: Biochemist, clinical  . Number of children: 3  . Years of education: 16.5  . Highest education level: Some college, no degree  Occupational History  . Occupation: Hair Stylist  Tobacco Use  . Smoking status: Never Smoker  . Smokeless tobacco: Never Used  Substance and Sexual Activity  . Alcohol use: No    Alcohol/week: 0.0 standard drinks  . Drug use: No  . Sexual activity: Yes    Partners: Male  Other Topics  Concern  . Not on file  Social History Narrative   Lives with her husband. She is working full time as a Probation officer. Two of her children live close by. One is in [place]. One of her daughter's is deceased. Her hobbies include watch tv, listening to music and reading.    Social Determinants of Health   Financial Resource Strain: Low Risk   . Difficulty of Paying Living Expenses: Not hard at all  Food Insecurity: No Food Insecurity  . Worried About Charity fundraiser in the Last Year: Never true  . Ran Out of Food in the Last Year: Never true  Transportation Needs: No Transportation Needs  . Lack of Transportation (Medical): No  . Lack of Transportation (Non-Medical): No  Physical Activity: Inactive  . Days of Exercise per Week: 0 days  . Minutes of Exercise per Session: 0 min  Stress: Stress Concern Present  . Feeling of Stress : To some extent  Social Connections: Moderately Integrated  . Frequency of Communication with Friends and Family: More than three times a week  . Frequency of Social Gatherings with Friends and Family: Once a week  . Attends Religious Services: Never  . Active Member of Clubs or Organizations:  Yes  . Attends Club or Organization Meetings: More than 4 times per year  . Marital Status: Married  Human resources officer Violence: Not At Risk  . Fear of Current or Ex-Partner: No  . Emotionally Abused: No  . Physically Abused: No  . Sexually Abused: No    Outpatient Medications Prior to Visit  Medication Sig Dispense Refill  . atorvastatin (LIPITOR) 10 MG tablet TAKE 1 TABLET BY MOUTH EVERYDAY AT BEDTIME 90 tablet 3  . B-D ULTRAFINE III SHORT PEN 31G X 8 MM MISC SMARTSIG:1 Each SUB-Q Daily    . Calcium Carbonate-Vit D-Min (CALCIUM 1200 PO) Take by mouth.    . Cholecalciferol (VITAMIN D3) 400 UNITS CAPS Take by mouth.    . insulin degludec (TRESIBA FLEXTOUCH) 100 UNIT/ML FlexTouch Pen Inject 10-25 Units into the skin daily. 6 mL 1  . losartan (COZAAR) 25 MG  tablet TAKE 1/2 TABLET BY MOUTH EVERY DAY 45 tablet 3  . metFORMIN (GLUCOPHAGE-XR) 500 MG 24 hr tablet TAKE 2 TABLETS BY MOUTH EVERY DAY WITH BREAKFAST 180 tablet 1  . pantoprazole (PROTONIX) 40 MG tablet Take 1 tablet (40 mg total) by mouth daily. 30 tablet 3  . Probiotic Product (PROBIOTIC PO) Take by mouth.    Marland Kitchen rOPINIRole (REQUIP) 3 MG tablet TAKE 1 TABLET BY MOUTH DAILY 90 tablet 1  . vitamin E 100 UNIT capsule Take 200 Units by mouth daily.    . dapagliflozin propanediol (FARXIGA) 10 MG TABS tablet Take 1 tablet (10 mg total) by mouth daily before breakfast. (Patient not taking: Reported on 01/30/2021) 30 tablet 5   No facility-administered medications prior to visit.    Allergies  Allergen Reactions  . Bydureon [Exenatide] Nausea And Vomiting  . Glipizide Other (See Comments)    Leg cramps, fatigue, and weight gain  . Trulicity [Dulaglutide] Diarrhea  . Invokana [Canagliflozin] Other (See Comments)    Yeast infection  . Victoza [Liraglutide] Nausea Only and Other (See Comments)    Constipation,belching,bloating    ROS Review of Systems    Objective:    Physical Exam Constitutional:      Appearance: She is well-developed.  HENT:     Head: Normocephalic and atraumatic.  Cardiovascular:     Rate and Rhythm: Normal rate and regular rhythm.     Heart sounds: Normal heart sounds.  Pulmonary:     Effort: Pulmonary effort is normal.     Breath sounds: Normal breath sounds.  Skin:    General: Skin is warm and dry.  Neurological:     Mental Status: She is alert and oriented to person, place, and time.  Psychiatric:        Behavior: Behavior normal.     BP 132/80   Pulse 71   Ht 5\' 2"  (1.575 m)   Wt 195 lb (88.5 kg)   SpO2 97%   BMI 35.67 kg/m  Wt Readings from Last 3 Encounters:  01/30/21 195 lb (88.5 kg)  01/30/21 195 lb (88.5 kg)  12/26/20 197 lb (89.4 kg)     There are no preventive care reminders to display for this patient.  There are no preventive  care reminders to display for this patient.  Lab Results  Component Value Date   TSH 1.99 07/09/2018   Lab Results  Component Value Date   WBC 6.7 12/26/2020   HGB 12.7 12/26/2020   HCT 37.8 12/26/2020   MCV 83.3 12/26/2020   PLT 254 12/26/2020   Lab Results  Component Value Date  NA 139 12/26/2020   K 4.1 12/26/2020   CO2 26 12/26/2020   GLUCOSE 190 (H) 12/26/2020   BUN 13 12/26/2020   CREATININE 0.59 (L) 12/26/2020   BILITOT 1.2 12/26/2020   ALKPHOS 78 09/19/2016   AST 22 12/26/2020   ALT 34 (H) 12/26/2020   PROT 6.6 12/26/2020   ALBUMIN 4.3 09/19/2016   CALCIUM 10.0 12/26/2020   Lab Results  Component Value Date   CHOL 174 06/20/2020   Lab Results  Component Value Date   HDL 43 (L) 06/20/2020   Lab Results  Component Value Date   LDLCALC 81 06/20/2020   Lab Results  Component Value Date   TRIG 376 (H) 06/20/2020   Lab Results  Component Value Date   CHOLHDL 4.0 06/20/2020   Lab Results  Component Value Date   HGBA1C 7.8 (A) 12/26/2020      Assessment & Plan:   Problem List Items Addressed This Visit      Cardiovascular and Mediastinum   BP (high blood pressure)    Pressure is well controlled today.        Endocrine   Diabetes mellitus type 2, controlled ([place] [address]) - Primary    Reviewed with her how to gradually increase her insulin want her to go by 2 units daily until her fasting blood sugars are less than 160 in the morning and then I want her to stay at that dose sheet.  She has been giving her long-acting insulin in the morning which is perfectly fine.  She is continuing to monitor her glucose levels to try to get a sense of what is actually elevating her glucose.  She found out that crackers will elevate her sugar especially if she eats 7-8 of them.  I would like to have her come back in about a month to see how she is doing and if she is making any progress.        Other   Severe obesity (BMI 35.0-39.9) with comorbidity ([place])    Really  has made a commitment to get back on track with her diet and was staying active.         No orders of the defined types were placed in this encounter.   Follow-up: Return in about 2 weeks (around 02/13/2021) for Diabetes follow-up.    Beatrice Lecher, MD

## 2021-01-30 NOTE — Patient Instructions (Addendum)
[place] Maintenance Summary and Written Plan of Care  Ms. Romer ,  Thank you for allowing me to perform your Medicare Annual Wellness Visit and for your ongoing commitment to your health.   Health Maintenance & Immunization History Health Maintenance  Topic Date Due  . INFLUENZA VACCINE  02/09/2021 (Originally 06/12/2020)  . OPHTHALMOLOGY EXAM  02/10/2021 (Originally 08/12/2018)  . COVID-19 Vaccine (3 - Booster for Moderna series) 02/15/2021 (Originally 08/13/2020)  . FOOT EXAM  05/09/2021 (Originally 08/24/2020)  . MAMMOGRAM  01/30/2022 (Originally 09/24/2020)  . PNA vac Low Risk Adult (2 of 2 - PPSV23) 01/30/2022 (Originally 12/25/2018)  . HEMOGLOBIN A1C  06/25/2021  . COLONOSCOPY (Pts 45-65yrs Insurance coverage will need to be confirmed)  12/29/2022  . TETANUS/TDAP  06/28/2025  . DEXA SCAN  Completed  . Hepatitis C Screening  Completed  . HPV VACCINES  Aged Out   Immunization History  Administered Date(s) Administered  . Moderna Sars-Covid-2 Vaccination 01/06/2020, 02/12/2020  . Pneumococcal Conjugate-13 12/25/2017  . Tdap 03/26/2007, 06/29/2015    These are the patient goals that we discussed: Goals Addressed              This Visit's Progress   .  Patient Stated (pt-stated)        01/30/2021 AWV Goal: Diabetes Management  . Patient will maintain an A1C level below 8.0 . Patient will not develop any diabetic foot complications . Patient will not experience any hypoglycemic episodes over the next 3 months . Patient will notify our office of any CBG readings outside of the provider recommended range by calling [phone] . Patient will adhere to provider recommendations for diabetes management  Patient Self Management Activities . take all medications as prescribed and report any negative side effects . monitor and record blood sugar readings as directed . adhere to a low carbohydrate diet that incorporates lean proteins, vegetables,  whole grains, low glycemic fruits . check feet daily noting any sores, cracks, injuries, or callous formations . see PCP or podiatrist if she notices any changes in her legs, feet, or toenails . Patient will visit PCP and have an A1C level checked every 3 to 6 months as directed  . have a yearly eye exam to monitor for vascular changes associated with diabetes and will request that the report be sent to her pcp.  . consult with her PCP regarding any changes in her health or new or worsening symptoms         This is a list of Health Maintenance Items that are overdue or due now: Pneumococcal vaccine  Influenza vaccine Screening mammography Eye exam, Shingrix vaccine  Orders/Referrals Placed Today: Orders Placed This Encounter  Procedures  . Mammogram 3D SCREEN BREAST BILATERAL    Standing Status:   Future    Standing Expiration Date:   01/30/2022    Scheduling Instructions:     Please call patient to schedule the appointment.    Order Specific Question:   Reason for Exam (SYMPTOM  OR DIAGNOSIS REQUIRED)    Answer:   breast cancer screening    Order Specific Question:   Preferred imaging location?    Answer:   [facility]  . [facility]    Standing Status:   Future    Standing Expiration Date:   01/30/2022    Scheduling Instructions:     Please call patient to schedule    Order Specific Question:   Reason for exam:    Answer:   Post  menopausal    Order Specific Question:   Preferred imaging location?    Answer:   [facility]   (Contact our referral department at [phone] if you have not spoken with someone about your referral appointment within the next 5 days)    Follow-up Plan . Follow-up with [name], MD as planned . Referral has been sent for your mammogram and dexa scan . Medicare wellness in one year.      Diabetes Mellitus and Foot Care Foot care is an important part of your health, especially when you have diabetes. Diabetes  may cause you to have problems because of poor blood flow (circulation) to your feet and legs, which can cause your skin to:  Become thinner and drier.  Break more easily.  Heal more slowly.  Peel and crack. You may also have nerve damage (neuropathy) in your legs and feet, causing decreased feeling in them. This means that you may not notice minor injuries to your feet that could lead to more serious problems. Noticing and addressing any potential problems early is the best way to prevent future foot problems. How to care for your feet Foot hygiene  Wash your feet daily with warm water and mild soap. Do not use hot water. Then, pat your feet and the areas between your toes until they are completely dry. Do not soak your feet as this can dry your skin.  Trim your toenails straight across. Do not dig under them or around the cuticle. File the edges of your nails with an emery board or nail file.  Apply a moisturizing lotion or petroleum jelly to the skin on your feet and to dry, brittle toenails. Use lotion that does not contain alcohol and is unscented. Do not apply lotion between your toes.   Shoes and socks  Wear clean socks or stockings every day. Make sure they are not too tight. Do not wear knee-high stockings since they may decrease blood flow to your legs.  Wear shoes that fit properly and have enough cushioning. Always look in your shoes before you put them on to be sure there are no objects inside.  To break in new shoes, wear them for just a few hours a day. This prevents injuries on your feet. Wounds, scrapes, corns, and calluses  Check your feet daily for blisters, cuts, bruises, sores, and redness. If you cannot see the bottom of your feet, use a mirror or ask someone for help.  Do not cut corns or calluses or try to remove them with medicine.  If you find a minor scrape, cut, or break in the skin on your feet, keep it and the skin around it clean and dry. You may clean  these areas with mild soap and water. Do not clean the area with peroxide, alcohol, or iodine.  If you have a wound, scrape, corn, or callus on your foot, look at it several times a day to make sure it is healing and not infected. Check for: ? Redness, swelling, or pain. ? Fluid or blood. ? Warmth. ? Pus or a bad smell.   General tips  Do not cross your legs. This may decrease blood flow to your feet.  Do not use heating pads or hot water bottles on your feet. They may burn your skin. If you have lost feeling in your feet or legs, you may not know this is happening until it is too late.  Protect your feet from hot and cold by  wearing shoes, such as at the beach or on hot pavement.  Schedule a complete foot exam at least once a year (annually) or more often if you have foot problems. Report any cuts, sores, or bruises to your health care provider immediately. Where to find more information  American Diabetes Association: www.diabetes.org  Association of Diabetes Care & Education Specialists: www.diabeteseducator.org Contact a health care provider if:  You have a medical condition that increases your risk of infection and you have any cuts, sores, or bruises on your feet.  You have an injury that is not healing.  You have redness on your legs or feet.  You feel burning or tingling in your legs or feet.  You have pain or cramps in your legs and feet.  Your legs or feet are numb.  Your feet always feel cold.  You have pain around any toenails. Get help right away if:  You have a wound, scrape, corn, or callus on your foot and: ? You have pain, swelling, or redness that gets worse. ? You have fluid or blood coming from the wound, scrape, corn, or callus. ? Your wound, scrape, corn, or callus feels warm to the touch. ? You have pus or a bad smell coming from the wound, scrape, corn, or callus. ? You have a fever. ? You have a red line going up your leg. Summary  Check your  feet every day for blisters, cuts, bruises, sores, and redness.  Apply a moisturizing lotion or petroleum jelly to the skin on your feet and to dry, brittle toenails.  Wear shoes that fit properly and have enough cushioning.  If you have foot problems, report any cuts, sores, or bruises to your health care provider immediately.  Schedule a complete foot exam at least once a year (annually) or more often if you have foot problems. This information is not intended to replace advice given to you by your health care provider. Make sure you discuss any questions you have with your health care provider. Document Revised: 05/19/2020 Document Reviewed: 05/19/2020 Elsevier Patient Education  [place] Maintenance, Female Adopting a healthy lifestyle and getting preventive care are important in promoting health and wellness. Ask your health care provider about:  The right schedule for you to have regular tests and exams.  Things you can do on your own to prevent diseases and keep yourself healthy. What should I know about diet, weight, and exercise? Eat a healthy diet  Eat a diet that includes plenty of vegetables, fruits, low-fat dairy products, and lean protein.  Do not eat a lot of foods that are high in solid fats, added sugars, or sodium.   Maintain a healthy weight Body mass index (BMI) is used to identify weight problems. It estimates body fat based on height and weight. Your health care provider can help determine your BMI and help you achieve or maintain a healthy weight. Get regular exercise Get regular exercise. This is one of the most important things you can do for your health. Most adults should:  Exercise for at least 150 minutes each week. The exercise should increase your heart rate and make you sweat (moderate-intensity exercise).  Do strengthening exercises at least twice a week. This is in addition to the moderate-intensity exercise.  Spend less time  sitting. Even light physical activity can be beneficial. Watch cholesterol and blood lipids Have your blood tested for lipids and cholesterol at 75 years of age, then have this test  every 5 years. Have your cholesterol levels checked more often if:  Your lipid or cholesterol levels are high.  You are older than 75 years of age.  You are at high risk for heart disease. What should I know about cancer screening? Depending on your health history and family history, you may need to have cancer screening at various ages. This may include screening for:  Breast cancer.  Cervical cancer.  Colorectal cancer.  Skin cancer.  Lung cancer. What should I know about heart disease, diabetes, and high blood pressure? Blood pressure and heart disease  High blood pressure causes heart disease and increases the risk of stroke. This is more likely to develop in people who have high blood pressure readings, are of African descent, or are overweight.  Have your blood pressure checked: ? Every 3-5 years if you are 65-54 years of age. ? Every year if you are 31 years old or older. Diabetes Have regular diabetes screenings. This checks your fasting blood sugar level. Have the screening done:  Once every three years after age 24 if you are at a normal weight and have a low risk for diabetes.  More often and at a younger age if you are overweight or have a high risk for diabetes. What should I know about preventing infection? Hepatitis B If you have a higher risk for hepatitis B, you should be screened for this virus. Talk with your health care provider to find out if you are at risk for hepatitis B infection. Hepatitis C Testing is recommended for:  Everyone born from [birth year] through 1965.  Anyone with known risk factors for hepatitis C. Sexually transmitted infections (STIs)  Get screened for STIs, including gonorrhea and chlamydia, if: ? You are sexually active and are younger than 75 years of  age. ? You are older than 75 years of age and your health care provider tells you that you are at risk for this type of infection. ? Your sexual activity has changed since you were last screened, and you are at increased risk for chlamydia or gonorrhea. Ask your health care provider if you are at risk.  Ask your health care provider about whether you are at high risk for HIV. Your health care provider may recommend a prescription medicine to help prevent HIV infection. If you choose to take medicine to prevent HIV, you should first get tested for HIV. You should then be tested every 3 months for as long as you are taking the medicine. Pregnancy  If you are about to stop having your period (premenopausal) and you may become pregnant, seek counseling before you get pregnant.  Take 400 to 800 micrograms (mcg) of folic acid every day if you become pregnant.  Ask for birth control (contraception) if you want to prevent pregnancy. Osteoporosis and menopause Osteoporosis is a disease in which the bones lose minerals and strength with aging. This can result in bone fractures. If you are 18 years old or older, or if you are at risk for osteoporosis and fractures, ask your health care provider if you should:  Be screened for bone loss.  Take a calcium or vitamin D supplement to lower your risk of fractures.  Be given hormone replacement therapy (HRT) to treat symptoms of menopause. Follow these instructions at home: Lifestyle  Do not use any products that contain nicotine or tobacco, such as cigarettes, e-cigarettes, and chewing tobacco. If you need help quitting, ask your health care provider.  Do not use  street drugs.  Do not share needles.  Ask your health care provider for help if you need support or information about quitting drugs. Alcohol use  Do not drink alcohol if: ? Your health care provider tells you not to drink. ? You are pregnant, may be pregnant, or are planning to become  pregnant.  If you drink alcohol: ? Limit how much you use to 0-1 drink a day. ? Limit intake if you are breastfeeding.  Be aware of how much alcohol is in your drink. In the [place] one drink equals one 12 oz bottle of beer (355 mL), one 5 oz glass of wine (148 mL), or one 1 oz glass of hard liquor (44 mL). General instructions  Schedule regular health, dental, and eye exams.  Stay current with your vaccines.  Tell your health care provider if: ? You often feel depressed. ? You have ever been abused or do not feel safe at home. Summary  Adopting a healthy lifestyle and getting preventive care are important in promoting health and wellness.  Follow your health care provider's instructions about healthy diet, exercising, and getting tested or screened for diseases.  Follow your health care provider's instructions on monitoring your cholesterol and blood pressure. This information is not intended to replace advice given to you by your health care provider. Make sure you discuss any questions you have with your health care provider. Document Revised: 10/22/2018 Document Reviewed: 10/22/2018 Elsevier Patient Education  2021 [place] A mammogram is a low energy X-ray of the breasts that is done to check for abnormal changes. This procedure can screen for and detect any changes that may indicate breast cancer. Mammograms are regularly done on women. A man may have a mammogram if he has a lump or swelling in his breast. A mammogram can also identify other changes and variations in the breast, such as:  Inflammation of the breast tissue (mastitis).  An infected area that contains a collection of pus (abscess).  A fluid-filled sac (cyst).  Fibrocystic changes. This is when breast tissue becomes denser, which can make the tissue feel rope-like or uneven under the skin.  Tumors that are not cancerous (benign). Tell a health care provider:  About any allergies you  have.  If you have breast implants.  If you have had previous breast disease, biopsy, or surgery.  If you are breastfeeding.  If you are younger than age 58.  If you have a family history of breast cancer.  Whether you are pregnant or may be pregnant. What are the risks? Generally, this is a safe procedure. However, problems may occur, including:  Exposure to radiation. Radiation levels are very low with this test.  The results being misinterpreted.  The need for further tests.  The inability of the mammogram to detect certain cancers. What happens before the procedure?  Schedule your test about 1-2 weeks after your menstrual period if you are still menstruating. This is usually when your breasts are the least tender.  If you have had a mammogram done at a different facility in the past, get the mammogram X-rays or have them sent to your current exam facility. The new and old images will be compared.  Wash your breasts and underarms on the day of the test.  Do not wear deodorants, perfumes, lotions, or powders anywhere on your body on the day of the test.  Remove any jewelry from your neck.  Wear clothes that you can change into and  out of easily. What happens during the procedure?  You will undress from the waist up and put on a gown that opens in the front.  You will stand in front of the X-ray machine.  Each breast will be placed between two plastic or glass plates. The plates will compress your breast for a few seconds. Try to stay as relaxed as possible during the procedure. This does not cause any harm to your breasts and any discomfort you feel will be very brief.  X-rays will be taken from different angles of each breast. The procedure may vary among health care providers and hospitals.   What happens after the procedure?  The mammogram will be examined by a specialist (radiologist).  You may need to repeat certain parts of the test, depending on the quality  of the images. This is commonly done if the radiologist needs a better view of the breast tissue.  You may resume your normal activities.  It is up to you to get the results of your procedure. Ask your health care provider, or the department that is doing the procedure, when your results will be ready. Summary  A mammogram is a low energy X-ray of the breasts that is done to check for abnormal changes. A man may have a mammogram if he has a lump or swelling in his breast.  If you have had a mammogram done at a different facility in the past, get the mammogram X-rays or have them sent to your current exam facility in order to compare them.  Schedule your test about 1-2 weeks after your menstrual period if you are still menstruating.  For this test, each breast will be placed between two plastic or glass plates. The plates will compress your breast for a few seconds.  Ask when your test results will be ready. Make sure you get your test results. This information is not intended to replace advice given to you by your health care provider. Make sure you discuss any questions you have with your health care provider. Document Revised: 06/19/2018 Document Reviewed: 06/19/2018 Elsevier Patient Education  [place].

## 2021-01-30 NOTE — Assessment & Plan Note (Signed)
Pressure is well controlled today

## 2021-02-07 DIAGNOSIS — E119 Type 2 diabetes mellitus without complications: Secondary | ICD-10-CM | POA: Diagnosis not present

## 2021-02-08 ENCOUNTER — Other Ambulatory Visit: Payer: Self-pay

## 2021-02-08 DIAGNOSIS — E1165 Type 2 diabetes mellitus with hyperglycemia: Secondary | ICD-10-CM

## 2021-02-08 MED ORDER — ON CALL PLUS BLOOD GLUCOSE VI STRP: ORAL_STRIP | 99 refills | Status: DC

## 2021-02-12 ENCOUNTER — Other Ambulatory Visit: Payer: Self-pay | Admitting: Family Medicine

## 2021-03-17 ENCOUNTER — Other Ambulatory Visit: Payer: Self-pay | Admitting: Family Medicine

## 2021-03-27 ENCOUNTER — Telehealth: Payer: Self-pay | Admitting: Family Medicine

## 2021-03-27 NOTE — Progress Notes (Signed)
  Chronic Care Management   Note  03/27/2021 Name: Jillian Huynh MRN: [id number] DOB: 04-08-1946  Jillian Huynh is a 75 y.o. year old female who is a primary care patient of Metheney, Rene Kocher, MD. I reached out to Rosalita Chessman by phone today in response to a referral sent by [name]'s PCP, [name], MD.   [name] was given information about Chronic Care Management services today including:  1. CCM service includes personalized support from designated clinical staff supervised by her physician, including individualized plan of care and coordination with other care providers 2. 24/7 contact phone numbers for assistance for urgent and routine care needs. 3. Service will only be billed when office clinical staff spend 20 minutes or more in a month to coordinate care. 4. Only one practitioner may furnish and bill the service in a calendar month. 5. The patient may stop CCM services at any time (effective at the end of the month) by phone call to the office staff.   Patient agreed to services and verbal consent obtained.   Follow up plan:   Lauretta Grill [facility] Scheduler

## 2021-03-29 ENCOUNTER — Ambulatory Visit (INDEPENDENT_AMBULATORY_CARE_PROVIDER_SITE_OTHER): Payer: Medicare HMO

## 2021-03-29 ENCOUNTER — Other Ambulatory Visit: Payer: Self-pay

## 2021-03-29 DIAGNOSIS — Z780 Asymptomatic menopausal state: Secondary | ICD-10-CM | POA: Diagnosis not present

## 2021-03-29 DIAGNOSIS — M85832 Other specified disorders of bone density and structure, left forearm: Secondary | ICD-10-CM | POA: Diagnosis not present

## 2021-03-29 DIAGNOSIS — Z0000 Encounter for general adult medical examination without abnormal findings: Secondary | ICD-10-CM

## 2021-03-29 DIAGNOSIS — Z13820 Encounter for screening for osteoporosis: Secondary | ICD-10-CM

## 2021-04-03 ENCOUNTER — Ambulatory Visit (INDEPENDENT_AMBULATORY_CARE_PROVIDER_SITE_OTHER): Payer: Medicare HMO | Admitting: Pharmacist

## 2021-04-03 DIAGNOSIS — E118 Type 2 diabetes mellitus with unspecified complications: Secondary | ICD-10-CM

## 2021-04-03 DIAGNOSIS — I10 Essential (primary) hypertension: Secondary | ICD-10-CM | POA: Diagnosis not present

## 2021-04-03 DIAGNOSIS — E785 Hyperlipidemia, unspecified: Secondary | ICD-10-CM

## 2021-04-03 NOTE — Patient Instructions (Signed)
Visit Information   PATIENT GOALS:  Goals Addressed            This Visit's Progress   . Medication Management       Patient Goals/Self-Care Activities . Over the next 90 days, patient will:  take medications as prescribed and check glucose 2x per day, document, and provide at future appointments  Follow Up Plan: Telephone follow up appointment with care management team member scheduled for:  04/04/21 to complete full visit       Consent to CCM Services: Ms. Fair was given information about Chronic Care Management services today including:  1. CCM service includes personalized support from designated clinical staff supervised by her physician, including individualized plan of care and coordination with other care providers 2. 24/7 contact phone numbers for assistance for urgent and routine care needs. 3. Service will only be billed when office clinical staff spend 20 minutes or more in a month to coordinate care. 4. Only one practitioner may furnish and bill the service in a calendar month. 5. The patient may stop CCM services at any time (effective at the end of the month) by phone call to the office staff. 6. The patient will be responsible for cost sharing (co-pay) of up to 20% of the service fee (after annual deductible is met).  Patient agreed to services and verbal consent obtained.   Patient verbalizes understanding of instructions provided today and agrees to view in MyChart.   Telephone follow up appointment with care management team member scheduled for: 04/03/21  Keesha J Kline   CLINICAL CARE PLAN: Patient Care Plan: Medication Management    Problem Identified: HTN, DM, HLD   Priority: High    Long-Range Goal: Disease Progression Prevention   This Visit's Progress: On track  Priority: High  Note:   Current Barriers:  . Unable to achieve control of diabetes   Pharmacist Clinical Goal(s):  . Over the next 90 days, patient will achieve control of diabetes as  evidenced by blood glucose values and A1c  through collaboration with PharmD and provider.   Interventions: . 1:1 collaboration with Metheney, Catherine D, MD regarding development and update of comprehensive plan of care as evidenced by provider attestation and co-signature . Inter-disciplinary care team collaboration (see longitudinal plan of care) . Comprehensive medication review performed; medication list updated in electronic medical record  Diabetes: . Uncontrolled; tresiba 48 units daily, increasing by 2units every other day for a goal AM fasting BG ~160, not taking farxiga & patient seeking clarity on if this is something she should be taking . Current glucose readings: fasting glucose: 250-275, post prandial glucose: not currently checking . Denies hypoglycemic/hyperglycemic symptoms . Current meal patterns:  breakfast: oatmeal, eggs, "stays away from white bread";  lunch: ham & cheese 1/2 sandwhich (w/whole wheat), salad, apple;  dinner: pasta, potatoes, attempts meat/vegetables as much as possible;  snacks: crackers, cheese, sneaks cookie, husband brings sweets into house & patient struggles, describes self as "sugar-holic" ;  . Educated on fasting vs prandial glucose Recommended checking BG twice daily (first thing in AM, & ~2hr after lunch) , documenting numbers for discussion at next follow up. Hypertension: . Controlled; losartan 25mg daily;  . Current home readings: not currently checking . Denies hypotensive/hypertensive symptoms . Recommended continue current regimen Hyperlipidemia: . Uncontrolled; atorvastatin 10mg;  . Assessed medication + current labs, consider increasing atorvastatin dose in future visits  Patient Goals/Self-Care Activities . Over the next 90 days, patient will:  take medications as   prescribed and check glucose 2x per day, document, and provide at future appointments  Follow Up Plan: Telephone follow up appointment with care management team  member scheduled for:  04/04/21  to complete full visit

## 2021-04-03 NOTE — Progress Notes (Signed)
Chronic Care Management Pharmacy Note  04/03/2021 Name:  Katrianna Friesenhahn MRN:  [id number] DOB:  1946/09/21  Subjective: Damiyah Ditmars is an 75 y.o. year old female who is a primary patient of Metheney, Rene Kocher, MD.  The CCM team was consulted for assistance with disease management and care coordination needs.    Engaged with patient by telephone for initial visit in response to provider referral for pharmacy case management and/or care coordination services.   Consent to Services:  The patient was given the following information about Chronic Care Management services today, agreed to services, and gave verbal consent: 1. CCM service includes personalized support from designated clinical staff supervised by the primary care provider, including individualized plan of care and coordination with other care providers 2. 24/7 contact phone numbers for assistance for urgent and routine care needs. 3. Service will only be billed when office clinical staff spend 20 minutes or more in a month to coordinate care. 4. Only one practitioner may furnish and bill the service in a calendar month. 5.The patient may stop CCM services at any time (effective at the end of the month) by phone call to the office staff. 6. The patient will be responsible for cost sharing (co-pay) of up to 20% of the service fee (after annual deductible is met). Patient agreed to services and consent obtained.  Patient Care Team: [name], MD as PCP - General (Family Medicine) Darius Bump, [facility] as Pharmacist (Pharmacist)  Recent office visits: 01/30/21 - Dr. Madilyn Fireman (PCP) follow-up for DM, HTN  Recent consult visits: [facility] visits: None in previous 6 months  Objective:  Lab Results  Component Value Date   CREATININE 0.59 (L) 12/26/2020   CREATININE 0.56 (L) 06/20/2020   CREATININE 0.59 (L) 08/25/2019    Lab Results  Component Value Date   HGBA1C 7.8 (A) 12/26/2020   Last diabetic Eye exam:   Lab Results  Component Value Date/Time   HMDIABEYEEXA No Retinopathy 06/26/2017 12:00 AM    Last diabetic Foot exam: No results found for: HMDIABFOOTEX      Component Value Date/Time   CHOL 174 06/20/2020 1040   TRIG 376 (H) 06/20/2020 1040   HDL 43 (L) 06/20/2020 1040   CHOLHDL 4.0 06/20/2020 1040   VLDL 57 (H) 09/19/2016 1057   LDLCALC 81 06/20/2020 1040    Hepatic Function Latest Ref Rng & Units 12/26/2020 06/20/2020 08/25/2019  Total Protein 6.1 - 8.1 g/dL 6.6 6.7 6.7  Albumin 3.6 - 5.1 g/dL - - -  AST 10 - 35 U/L 22 17 15   ALT 6 - 29 U/L 34(H) 17 18  Alk Phosphatase 33 - 130 U/L - - -  Total Bilirubin 0.2 - 1.2 mg/dL 1.2 0.7 1.1    Lab Results  Component Value Date/Time   TSH 1.99 07/09/2018 03:43 PM   TSH 2.672 09/28/2015 10:42 AM    CBC Latest Ref Rng & Units 12/26/2020 07/09/2018 03/24/2015  WBC 3.8 - 10.8 Thousand/uL 6.7 8.1 7.1  Hemoglobin 11.7 - 15.5 g/dL 12.7 12.3 12.5  Hematocrit 35.0 - 45.0 % 37.8 36.8 37.4  Platelets 140 - 400 Thousand/uL 254 285 269    Lab Results  Component Value Date/Time   VD25OH 26 (L) 03/24/2015 09:08 AM    Clinical ASCVD: Yes  The 10-year ASCVD risk score Mikey Bussing DC Jr., et al., 2013) is: 34.9%   Values used to calculate the score:     Age: 61 years  Sex: Female     Is Non-Hispanic African American: No     Diabetic: Yes     Tobacco smoker: No     Systolic Blood Pressure: 756 mmHg     Is BP treated: Yes     HDL Cholesterol: 43 mg/dL     Total Cholesterol: 174 mg/dL     Social History   Tobacco Use  Smoking Status Never Smoker  Smokeless Tobacco Never Used   BP Readings from Last 3 Encounters:  01/30/21 132/80  01/30/21 132/80  12/26/20 138/68   Pulse Readings from Last 3 Encounters:  01/30/21 71  01/30/21 71  12/26/20 90   Wt Readings from Last 3 Encounters:  01/30/21 195 lb (88.5 kg)  01/30/21 195 lb (88.5 kg)  12/26/20 197 lb (89.4 kg)    Assessment: Review of patient past medical history, allergies,  medications, health status, including review of consultants reports, laboratory and other test data, was performed as part of comprehensive evaluation and provision of chronic care management services.   SDOH:  (Social Determinants of Health) assessments and interventions performed:    CCM Care Plan  Allergies  Allergen Reactions  . Bydureon [Exenatide] Nausea And Vomiting  . Glipizide Other (See Comments)    Leg cramps, fatigue, and weight gain  . Trulicity [Dulaglutide] Diarrhea  . Invokana [Canagliflozin] Other (See Comments)    Yeast infection  . Victoza [Liraglutide] Nausea Only and Other (See Comments)    Constipation,belching,bloating    Medications Reviewed Today    Reviewed by Darius Bump, [facility] (Pharmacist) on 04/03/21 at 1126  Med List Status: <None>  Medication Order Taking? Sig Documenting Provider Last Dose Status Informant  atorvastatin (LIPITOR) 10 MG tablet [id number] Yes TAKE 1 TABLET BY MOUTH EVERYDAY AT BEDTIME [name], MD Taking Active   B-D ULTRAFINE III SHORT PEN 31G X 8 MM MISC [id number] Yes SMARTSIG:1 Each SUB-Q Daily [provider] Taking Active   Calcium Carbonate-Vit D-Min (CALCIUM 1200 PO) [id number] Yes Take by mouth. [provider] Taking Active   Cholecalciferol (VITAMIN D3) 400 UNITS CAPS [id number] No Take by mouth. [provider] Unknown Active            Med Note Jamesetta Geralds Mar 21, 2020  9:43 AM)    Citicoline (COGNITIVE HEALTH) 500 MG CAPS [id number] Yes Take 1 capsule by mouth. [provider] Taking Active   dapagliflozin propanediol (FARXIGA) 10 MG TABS tablet [id number] No Take 1 tablet (10 mg total) by mouth daily before breakfast.  Patient not taking: No sig reported   [name], MD Not Taking Active   glucose blood (ON CALL PLUS BLOOD GLUCOSE) test strip [id number] Yes Dx DM E11.65. Check fasting blood sugar every morning and 2 hours after large meals daily. Check 3  times daily [name], MD Taking Active   insulin degludec (TRESIBA FLEXTOUCH) 100 UNIT/ML FlexTouch Pen [id number] Yes Inject 10-25 Units into the skin daily. [name], MD Taking Active            Med Note Rikki Spearing Apr 03, 2021 11:24 AM) Taking 48 units daily as of 04/03/21  losartan (COZAAR) 25 MG tablet [id number] Yes TAKE 1/2 TABLET BY MOUTH EVERY DAY [name], MD Taking Active   metFORMIN (GLUCOPHAGE-XR) 500 MG 24 hr tablet [id number] Yes TAKE 2 TABLETS BY MOUTH EVERY DAY WITH BREAKFAST [name], MD Taking Active   pantoprazole ([place])  40 MG tablet [id number] Yes TAKE 1 TABLET BY MOUTH EVERY DAY [name], MD Taking Active   Probiotic Product (PROBIOTIC PO) [id number] Yes Take by mouth. [provider] Taking Active   rOPINIRole (REQUIP) 3 MG tablet [id number] Yes TAKE 1 TABLET BY MOUTH EVERY DAY [name], MD Taking Active   vitamin E 100 UNIT capsule [id number] Yes Take 200 Units by mouth daily. [provider] Taking Active           Patient Active Problem List   Diagnosis Date Noted  . Gastroesophageal reflux disease without esophagitis 09/19/2020  . Severe obesity (BMI 35.0-39.9) with comorbidity ([place]) 05/25/2019  . Fatty liver 12/25/2017  . BP (high blood pressure) 03/28/2015  . Dyslipidemia 03/28/2015  . Insomnia 03/28/2015  . Restless leg 03/28/2015  . Mass of arm 03/14/2015  . Diabetes mellitus type 2, controlled ([place]) 03/14/2015  . Obstructive apnea 09/03/2012  . HYPERKERATOSIS 04/01/2009    Immunization History  Administered Date(s) Administered  . Moderna Sars-Covid-2 Vaccination 01/06/2020, 02/12/2020  . Pneumococcal Conjugate-13 12/25/2017  . Tdap 03/26/2007, 06/29/2015    Conditions to be addressed/monitored: HTN, HLD and DMII  Care Plan : Medication Management  Updates made by Darius Bump, [place] since 04/03/2021 12:00 AM    Problem: HTN, DM, HLD    Priority: High    Long-Range Goal: Disease Progression Prevention   This Visit's Progress: On track  Priority: High  Note:   Current Barriers:  . Unable to achieve control of diabetes   Pharmacist Clinical Goal(s):  Marland Kitchen Over the next 90 days, patient will achieve control of diabetes as evidenced by blood glucose values and A1c  through collaboration with PharmD and provider.   Interventions: . 1:1 collaboration with [name], MD regarding development and update of comprehensive plan of care as evidenced by provider attestation and co-signature . Inter-disciplinary care team collaboration (see longitudinal plan of care) . Comprehensive medication review performed; medication list updated in electronic medical record  Diabetes: . Uncontrolled; tresiba 48 units daily, increasing by 2units every other day for a goal AM fasting BG ~160, not taking farxiga & patient seeking clarity on if this is something she should be taking . Current glucose readings: fasting glucose: 250-275, post prandial glucose: not currently checking . Denies hypoglycemic/hyperglycemic symptoms . Current meal patterns:  breakfast: oatmeal, eggs, "stays away from white bread";  lunch: ham & cheese 1/2 sandwhich (w/whole wheat), salad, apple;  dinner: pasta, potatoes, attempts meat/vegetables as much as possible;  snacks: crackers, cheese, sneaks cookie, husband brings sweets into house & patient struggles, describes self as "sugar-holic" ;  . Educated on fasting vs prandial glucose Recommended checking BG twice daily (first thing in AM, & ~2hr after lunch) , documenting numbers for discussion at next follow up. Hypertension: . Controlled; losartan 60m daily;  . Current home readings: not currently checking . Denies hypotensive/hypertensive symptoms . Recommended continue current regimen Hyperlipidemia: . Uncontrolled; atorvastatin 176m  . Assessed medication + current labs, consider increasing  atorvastatin dose in future visits  Patient Goals/Self-Care Activities . Over the next 90 days, patient will:  take medications as prescribed and check glucose 2x per day, document, and provide at future appointments  Follow Up Plan: Telephone follow up appointment with care management team member scheduled for:  04/04/21  to complete full visit      Medication Assistance: None required.  Patient affirms current coverage meets needs.  [facility]'s [facility] pharmacy [facility]:  [id number]  [place] [address] [place] [address] Phone: [phone] Fax: [place], [place] [place] [address] Phone: [phone] Fax: [phone]    Follow Up:  Patient agrees to Care Plan and Follow-up.  Plan: Telephone follow up appointment with care management team member scheduled for:  04/05/21 Patient had to end visit early on 04/03/21 due to taking a phone call, will re-engage on 04/05/21 to complete care.  Darius Bump

## 2021-04-05 ENCOUNTER — Ambulatory Visit: Payer: Medicare HMO | Admitting: Pharmacist

## 2021-04-05 DIAGNOSIS — I10 Essential (primary) hypertension: Secondary | ICD-10-CM

## 2021-04-05 DIAGNOSIS — E785 Hyperlipidemia, unspecified: Secondary | ICD-10-CM

## 2021-04-05 DIAGNOSIS — E118 Type 2 diabetes mellitus with unspecified complications: Secondary | ICD-10-CM

## 2021-04-05 NOTE — Patient Instructions (Signed)
Visit Information  PATIENT GOALS: Goals Addressed            This Visit's Progress   . Medication Management       Patient Goals/Self-Care Activities . Over the next 90 days, patient will:  take medications as prescribed and scan CGM 3x per day, for data to be discussed at future appointments   Follow Up Plan: Telephone follow up appointment with care management team member scheduled for:  2 weeks to discuss CGM data       Patient verbalizes understanding of instructions provided today and agrees to view in [place].   Telephone follow up appointment with care management team member scheduled for: 2 weeks  Jillian Huynh

## 2021-04-05 NOTE — Progress Notes (Signed)
Chronic Care Management Pharmacy Note  04/05/2021 Name:  Jillian Huynh MRN:  [id number] DOB:  1946-08-06  Subjective: Jillian Huynh is an 75 y.o. year old female who is a primary patient of Metheney, Rene Kocher, MD.  The CCM team was consulted for assistance with disease management and care coordination needs.    Engaged with patient by telephone for initial visit in response to provider referral for pharmacy case management and/or care coordination services.   Consent to Services:  The patient was given information about Chronic Care Management services, agreed to services, and gave verbal consent prior to initiation of services.  Please see initial visit note for detailed documentation.   Patient Care Team: [name], MD as PCP - General (Family Medicine) Darius Bump, [facility] as Pharmacist (Pharmacist)  Recent office visits: 01/30/21 - Dr. Madilyn Fireman (PCP) follow-up for DM, HTN  Recent consult visits: [facility] visits: None in previous 6 months  Objective:  Lab Results  Component Value Date   CREATININE 0.59 (L) 12/26/2020   CREATININE 0.56 (L) 06/20/2020   CREATININE 0.59 (L) 08/25/2019    Lab Results  Component Value Date   HGBA1C 7.8 (A) 12/26/2020   Last diabetic Eye exam:  Lab Results  Component Value Date/Time   HMDIABEYEEXA No Retinopathy 06/26/2017 12:00 AM    Last diabetic Foot exam: No results found for: HMDIABFOOTEX      Component Value Date/Time   CHOL 174 06/20/2020 1040   TRIG 376 (H) 06/20/2020 1040   HDL 43 (L) 06/20/2020 1040   CHOLHDL 4.0 06/20/2020 1040   VLDL 57 (H) 09/19/2016 1057   LDLCALC 81 06/20/2020 1040    Hepatic Function Latest Ref Rng & Units 12/26/2020 06/20/2020 08/25/2019  Total Protein 6.1 - 8.1 g/dL 6.6 6.7 6.7  Albumin 3.6 - 5.1 g/dL - - -  AST 10 - 35 U/L 22 17 15   ALT 6 - 29 U/L 34(H) 17 18  Alk Phosphatase 33 - 130 U/L - - -  Total Bilirubin 0.2 - 1.2 mg/dL 1.2 0.7 1.1    Lab Results  Component Value  Date/Time   TSH 1.99 07/09/2018 03:43 PM   TSH 2.672 09/28/2015 10:42 AM    CBC Latest Ref Rng & Units 12/26/2020 07/09/2018 03/24/2015  WBC 3.8 - 10.8 Thousand/uL 6.7 8.1 7.1  Hemoglobin 11.7 - 15.5 g/dL 12.7 12.3 12.5  Hematocrit 35.0 - 45.0 % 37.8 36.8 37.4  Platelets 140 - 400 Thousand/uL 254 285 269    Lab Results  Component Value Date/Time   VD25OH 26 (L) 03/24/2015 09:08 AM    Clinical ASCVD: Yes  The 10-year ASCVD risk score Mikey Bussing DC Jr., et al., 2013) is: 34.9%   Values used to calculate the score:     Age: 77 years     Sex: Female     Is Non-Hispanic African American: No     Diabetic: Yes     Tobacco smoker: No     Systolic Blood Pressure: 371 mmHg     Is BP treated: Yes     HDL Cholesterol: 43 mg/dL     Total Cholesterol: 174 mg/dL     Social History   Tobacco Use  Smoking Status Never Smoker  Smokeless Tobacco Never Used   BP Readings from Last 3 Encounters:  01/30/21 132/80  01/30/21 132/80  12/26/20 138/68   Pulse Readings from Last 3 Encounters:  01/30/21 71  01/30/21 71  12/26/20 90   Wt Readings from Last  3 Encounters:  01/30/21 195 lb (88.5 kg)  01/30/21 195 lb (88.5 kg)  12/26/20 197 lb (89.4 kg)    Assessment: Review of patient past medical history, allergies, medications, health status, including review of consultants reports, laboratory and other test data, was performed as part of comprehensive evaluation and provision of chronic care management services.   SDOH:  (Social Determinants of Health) assessments and interventions performed:    CCM Care Plan  Allergies  Allergen Reactions  . Bydureon [Exenatide] Nausea And Vomiting  . Glipizide Other (See Comments)    Leg cramps, fatigue, and weight gain  . Trulicity [Dulaglutide] Diarrhea  . Invokana [Canagliflozin] Other (See Comments)    Yeast infection  . Victoza [Liraglutide] Nausea Only and Other (See Comments)    Constipation,belching,bloating    Medications Reviewed Today     Reviewed by Darius Bump, [facility] (Pharmacist) on 04/05/21 at 2140  Med List Status: <None>  Medication Order Taking? Sig Documenting Provider Last Dose Status Informant  atorvastatin (LIPITOR) 10 MG tablet [id number] Yes TAKE 1 TABLET BY MOUTH EVERYDAY AT BEDTIME [name], MD Taking Active   B-D ULTRAFINE III SHORT PEN 31G X 8 MM MISC [id number] Yes SMARTSIG:1 Each SUB-Q Daily [provider] Taking Active   Calcium Carbonate-Vit D-Min (CALCIUM 1200 PO) [id number] No Take by mouth. [provider] Unknown Active   Cholecalciferol (VITAMIN D3) 400 UNITS CAPS [id number] Yes Take by mouth. [provider] Taking Active            Med Note Jamesetta Geralds Mar 21, 2020  9:43 AM)    Citicoline (COGNITIVE HEALTH) 500 MG CAPS [id number] Yes Take 1 capsule by mouth. [provider] Taking Active   dapagliflozin propanediol (FARXIGA) 10 MG TABS tablet [id number] Yes Take 1 tablet (10 mg total) by mouth daily before breakfast. [name], MD Taking Active   glucose blood (ON CALL PLUS BLOOD GLUCOSE) test strip [id number] Yes Dx DM E11.65. Check fasting blood sugar every morning and 2 hours after large meals daily. Check 3 times daily [name], MD Taking Active   insulin degludec (TRESIBA FLEXTOUCH) 100 UNIT/ML FlexTouch Pen [id number] Yes Inject 10-25 Units into the skin daily. [name], MD Taking Active            Med Note Dorene Ar Apr 05, 2021  9:40 PM) Taking 50 unit daily as of 04/05/21  losartan (COZAAR) 25 MG tablet [id number] Yes TAKE 1/2 TABLET BY MOUTH EVERY DAY [name], MD Taking Active   metFORMIN (GLUCOPHAGE-XR) 500 MG 24 hr tablet [id number] Yes TAKE 2 TABLETS BY MOUTH EVERY DAY WITH BREAKFAST [name], MD Taking Active   pantoprazole (PROTONIX) 40 MG tablet [id number] Yes TAKE 1 TABLET BY MOUTH EVERY DAY [name], MD Taking Active   Probiotic Product (PROBIOTIC  PO) [id number] Yes Take by mouth. [provider] Taking Active   rOPINIRole (REQUIP) 3 MG tablet [id number] Yes TAKE 1 TABLET BY MOUTH EVERY DAY [name], MD Taking Active   vitamin E 100 UNIT capsule [id number] Yes Take 200 Units by mouth daily. [provider] Taking Active           Patient Active Problem List   Diagnosis Date Noted  . Gastroesophageal reflux disease without esophagitis 09/19/2020  . Severe obesity (BMI 35.0-39.9) with comorbidity ([place]) 05/25/2019  . Fatty liver 12/25/2017  . BP (high blood pressure) 03/28/2015  .  Dyslipidemia 03/28/2015  . Insomnia 03/28/2015  . Restless leg 03/28/2015  . Mass of arm 03/14/2015  . Diabetes mellitus type 2, controlled ([place]) 03/14/2015  . Obstructive apnea 09/03/2012  . HYPERKERATOSIS 04/01/2009    Immunization History  Administered Date(s) Administered  . Moderna Sars-Covid-2 Vaccination 01/06/2020, 02/12/2020  . Pneumococcal Conjugate-13 12/25/2017  . Tdap 03/26/2007, 06/29/2015    Conditions to be addressed/monitored: HTN, HLD and DMII  Care Plan : Medication Management  Updates made by Darius Bump, [place] since 04/05/2021 12:00 AM    Problem: HTN, DM, HLD   Priority: High    Long-Range Goal: Disease Progression Prevention   This Visit's Progress: On track  Recent Progress: On track  Priority: High  Note:   Current Barriers:  . Unable to achieve control of diabetes   Pharmacist Clinical Goal(s):  Marland Kitchen Over the next 90 days, patient will achieve control of diabetes as evidenced by blood glucose values and A1c through collaboration with PharmD and provider.   Interventions: . 1:1 collaboration with [name], MD regarding development and update of comprehensive plan of care as evidenced by provider attestation and co-signature . Inter-disciplinary care team collaboration (see longitudinal plan of care) . Comprehensive medication review performed; medication list updated in  electronic medical record  Diabetes: . Uncontrolled; tresiba 50 units daily, increasing by 2units every other day for a goal AM fasting BG ~160, restarted farxiga 04/05/21  . Current glucose readings: fasting glucose: 270-300, postprandial BG 225 during office visit at 2pm . Denies hypoglycemic/hyperglycemic symptoms . Current meal patterns:  breakfast: oatmeal, eggs, "stays away from white bread";  lunch: ham & cheese 1/2 sandwhich (w/whole wheat), salad, apple;  dinner: pasta, potatoes, attempts meat/vegetables as much as possible;  snacks: crackers, cheese, sneaks cookie, husband brings sweets into house & patient struggles, describes self as "sugar-holic" ;  . Initiated CGM, counseled patient on sensor application, scanning technique, and recommended scanning 3x per day at minimum.  Hypertension: . Controlled; losartan 1m daily;  . Current home readings: not currently checking . Denies hypotensive/hypertensive symptoms . Recommended continue current regimen  Hyperlipidemia: . Uncontrolled; atorvastatin 169m  . Assessed medication + current labs, consider increasing atorvastatin dose in future visits  Patient Goals/Self-Care Activities . Over the next 90 days, patient will:  take medications as prescribed and scan CGM 3x per day, for data to be discussed at future appointments   Follow Up Plan: Telephone follow up appointment with care management team member scheduled for:  2 weeks to discuss CGM data     Medication Assistance: None required.  Patient affirms current coverage meets needs.  [facility]  [id number] [place] [id number]: [phone]: [id number] [id number]: [phone]: [phone] Follow Up:  Patient agrees to Care Plan and  Follow-up.  Plan: Telephone follow up appointment with care management team member scheduled for:  2 weeks to discuss CGM data  Note: began with phone visit, 9:28 - 9:35am on phone visit, finished visit in person 2-3pm for CGM placement.  KeeDarius Bump

## 2021-04-07 ENCOUNTER — Telehealth: Payer: Self-pay | Admitting: Pharmacist

## 2021-04-07 NOTE — Telephone Encounter (Signed)
Attempted to return call to [place] after she called the Primary Care Office on 04/06/21 asking for the pharmacist.  Phone is off, left voicemail.   Darius Bump

## 2021-04-11 NOTE — Telephone Encounter (Signed)
Jillian Huynh so [name] informed me that her sensor stopped working. She tried uninstalling and installing the app, this did not work. I asked if she could have bumped the sensor out of place she said she didn't think that she did and that it had been working fine until it just stopped.   She said that she took it off on yesterday. I asked if she had another sensor to replace this one she does not.   She can come by tomorrow or can be reached anytime EXECEPT 1030 AM  She has a business meeting during that time and will be unavailable.

## 2021-04-12 ENCOUNTER — Ambulatory Visit (INDEPENDENT_AMBULATORY_CARE_PROVIDER_SITE_OTHER): Payer: Medicare HMO | Admitting: Pharmacist

## 2021-04-12 ENCOUNTER — Other Ambulatory Visit: Payer: Self-pay

## 2021-04-12 DIAGNOSIS — E118 Type 2 diabetes mellitus with unspecified complications: Secondary | ICD-10-CM

## 2021-04-12 DIAGNOSIS — I10 Essential (primary) hypertension: Secondary | ICD-10-CM

## 2021-04-12 DIAGNOSIS — E785 Hyperlipidemia, unspecified: Secondary | ICD-10-CM

## 2021-04-12 NOTE — Progress Notes (Signed)
Chronic Care Management Pharmacy Note  04/12/2021 Name:  Jillian Huynh MRN:  [id number] DOB:  [birth year]  Summary: Patient loves CGM sensor but encountered an issue where she downloaded a duplicate/similar (but incorrect) app, so would not scan. Resolved this in clinic today, applied new sample sensor, and CGM order through DME is pending.    Recommendations/Changes made from today's visit: Applied new CGM sensor  Plan: 2 wk phone visit w/ pharmacist to discuss CGM data     Subjective: Jillian Huynh is an 75 y.o. year old female who is a primary patient of Metheney, Rene Kocher, MD.  The CCM team was consulted for assistance with disease management and care coordination needs.    Engaged with patient face to face for follow up visit in response to provider referral for pharmacy case management and/or care coordination services.   Consent to Services:  The patient was given information about Chronic Care Management services, agreed to services, and gave verbal consent prior to initiation of services.  Please see initial visit note for detailed documentation.   Patient Care Team: [name], MD as PCP - General (Family Medicine) Darius Bump, [facility] as Pharmacist (Pharmacist)  Recent office visits: 04/05/21 - completed 2nd portion of CCM visit onsite for CGM sensor application 6/72/09 - initial visit for CCM pharmacist services 01/30/21 - Dr. Madilyn Fireman (PCP) follow-up for DM, HTN  Recent consult visits: [facility] visits: None in previous 6 months  Objective:  Lab Results  Component Value Date   CREATININE 0.59 (L) 12/26/2020   CREATININE 0.56 (L) 06/20/2020   CREATININE 0.59 (L) 08/25/2019    Lab Results  Component Value Date   HGBA1C 7.8 (A) 12/26/2020   Last diabetic Eye exam:  Lab Results  Component Value Date/Time   HMDIABEYEEXA No Retinopathy 06/26/2017 12:00 AM    Last diabetic Foot exam: No results found for: HMDIABFOOTEX      Component  Value Date/Time   CHOL 174 06/20/2020 1040   TRIG 376 (H) 06/20/2020 1040   HDL 43 (L) 06/20/2020 1040   CHOLHDL 4.0 06/20/2020 1040   VLDL 57 (H) 09/19/2016 1057   LDLCALC 81 06/20/2020 1040    Hepatic Function Latest Ref Rng & Units 12/26/2020 06/20/2020 08/25/2019  Total Protein 6.1 - 8.1 g/dL 6.6 6.7 6.7  Albumin 3.6 - 5.1 g/dL - - -  AST 10 - 35 U/L 22 17 15   ALT 6 - 29 U/L 34(H) 17 18  Alk Phosphatase 33 - 130 U/L - - -  Total Bilirubin 0.2 - 1.2 mg/dL 1.2 0.7 1.1    Lab Results  Component Value Date/Time   TSH 1.99 07/09/2018 03:43 PM   TSH 2.672 09/28/2015 10:42 AM    CBC Latest Ref Rng & Units 12/26/2020 07/09/2018 03/24/2015  WBC 3.8 - 10.8 Thousand/uL 6.7 8.1 7.1  Hemoglobin 11.7 - 15.5 g/dL 12.7 12.3 12.5  Hematocrit 35.0 - 45.0 % 37.8 36.8 37.4  Platelets 140 - 400 Thousand/uL 254 285 269    Lab Results  Component Value Date/Time   VD25OH 26 (L) 03/24/2015 09:08 AM    Clinical ASCVD: Yes  The 10-year ASCVD risk score Mikey Bussing DC Jr., et al., 2013) is: 34.9%   Values used to calculate the score:     Age: 89 years     Sex: Female     Is Non-Hispanic African American: No     Diabetic: Yes     Tobacco smoker: No  Systolic Blood Pressure: 568 mmHg     Is BP treated: Yes     HDL Cholesterol: 43 mg/dL     Total Cholesterol: 174 mg/dL    Social History   Tobacco Use  Smoking Status Never Smoker  Smokeless Tobacco Never Used   BP Readings from Last 3 Encounters:  01/30/21 132/80  01/30/21 132/80  12/26/20 138/68   Pulse Readings from Last 3 Encounters:  01/30/21 71  01/30/21 71  12/26/20 90   Wt Readings from Last 3 Encounters:  01/30/21 195 lb (88.5 kg)  01/30/21 195 lb (88.5 kg)  12/26/20 197 lb (89.4 kg)    Assessment: Review of patient past medical history, allergies, medications, health status, including review of consultants reports, laboratory and other test data, was performed as part of comprehensive evaluation and provision of chronic  care management services.   SDOH:  (Social Determinants of Health) assessments and interventions performed:    CCM Care Plan  Allergies  Allergen Reactions  . Bydureon [Exenatide] Nausea And Vomiting  . Glipizide Other (See Comments)    Leg cramps, fatigue, and weight gain  . Trulicity [Dulaglutide] Diarrhea  . Invokana [Canagliflozin] Other (See Comments)    Yeast infection  . Victoza [Liraglutide] Nausea Only and Other (See Comments)    Constipation,belching,bloating    Medications Reviewed Today    Reviewed by Darius Bump, [facility] (Pharmacist) on 04/12/21 at [place] List Status: <None>  Medication Order Taking? Sig Documenting Provider Last Dose Status Informant  atorvastatin (LIPITOR) 10 MG tablet [id number] Yes TAKE 1 TABLET BY MOUTH EVERYDAY AT BEDTIME [name], MD Taking Active   B-D ULTRAFINE III SHORT PEN 31G X 8 MM MISC [id number] Yes SMARTSIG:1 Each SUB-Q Daily [provider] Taking Active   Calcium Carbonate-Vit D-Min (CALCIUM 1200 PO) [id number] Yes Take by mouth. [provider] Taking Active   Cholecalciferol (VITAMIN D3) 400 UNITS CAPS [id number] Yes Take by mouth. [provider] Taking Active            Med Note Jamesetta Geralds Mar 21, 2020  9:43 AM)    Citicoline (COGNITIVE HEALTH) 500 MG CAPS [id number] Yes Take 1 capsule by mouth. [provider] Taking Active   dapagliflozin propanediol (FARXIGA) 10 MG TABS tablet [id number] Yes Take 1 tablet (10 mg total) by mouth daily before breakfast. [name], MD Taking Active   glucose blood (ON CALL PLUS BLOOD GLUCOSE) test strip [id number] Yes Dx DM E11.65. Check fasting blood sugar every morning and 2 hours after large meals daily. Check 3 times daily [name], MD Taking Active   insulin degludec (TRESIBA FLEXTOUCH) 100 UNIT/ML FlexTouch Pen [id number] Yes Inject 10-25 Units into the skin daily. [name], MD Taking Active             Med Note Dorene Ar Apr 05, 2021  9:40 PM) Taking 50 unit daily as of 04/05/21  losartan (COZAAR) 25 MG tablet [id number] Yes TAKE 1/2 TABLET BY MOUTH EVERY DAY [name], MD Taking Active   metFORMIN (GLUCOPHAGE-XR) 500 MG 24 hr tablet [id number] Yes TAKE 2 TABLETS BY MOUTH EVERY DAY WITH BREAKFAST [name], MD Taking Active   pantoprazole (PROTONIX) 40 MG tablet [id number] Yes TAKE 1 TABLET BY MOUTH EVERY DAY [name], MD Taking Active   Probiotic Product (PROBIOTIC PO) [id number] Yes Take by mouth. [provider] Taking Active   rOPINIRole (REQUIP)  3 MG tablet [id number] Yes TAKE 1 TABLET BY MOUTH EVERY DAY [name], MD Taking Active   vitamin E 100 UNIT capsule [id number] Yes Take 200 Units by mouth daily. [provider] Taking Active           Patient Active Problem List   Diagnosis Date Noted  . Gastroesophageal reflux disease without esophagitis 09/19/2020  . Severe obesity (BMI 35.0-39.9) with comorbidity ([place]) 05/25/2019  . Fatty liver 12/25/2017  . BP (high blood pressure) 03/28/2015  . Dyslipidemia 03/28/2015  . Insomnia 03/28/2015  . Restless leg 03/28/2015  . Mass of arm 03/14/2015  . Diabetes mellitus type 2, controlled ([place]) 03/14/2015  . Obstructive apnea 09/03/2012  . HYPERKERATOSIS 04/01/2009    Immunization History  Administered Date(s) Administered  . Moderna Sars-Covid-2 Vaccination 01/06/2020, 02/12/2020  . Pneumococcal Conjugate-13 12/25/2017  . Tdap 03/26/2007, 06/29/2015    Conditions to be addressed/monitored: HTN, HLD and DMII  Care Plan : Medication Management  Updates made by Darius Bump, [place] since 04/12/2021 12:00 AM    Problem: HTN, DM, HLD   Priority: High    Long-Range Goal: Disease Progression Prevention   Recent Progress: On track  Priority: High  Note:   Current Barriers:  . Unable to achieve control of diabetes   Pharmacist Clinical Goal(s):  Marland Kitchen Over  the next 90 days, patient will achieve control of diabetes as evidenced by blood glucose values and A1c through collaboration with PharmD and provider.   Interventions: . 1:1 collaboration with [name], MD regarding development and update of comprehensive plan of care as evidenced by provider attestation and co-signature . Inter-disciplinary care team collaboration (see longitudinal plan of care) . Comprehensive medication review performed; medication list updated in electronic medical record  Diabetes: . Uncontrolled; tresiba 50 units daily, increasing by 2units every other day for a goal AM fasting BG ~160, restarted farxiga 04/05/21  . Current glucose readings: fasting glucose: 270-300, postprandial BG 225 during office visit at 2pm . Denies hypoglycemic/hyperglycemic symptoms . Current meal patterns:  breakfast: oatmeal, eggs, "stays away from white bread";  lunch: ham & cheese 1/2 sandwhich (w/whole wheat), salad, apple;  dinner: pasta, potatoes, attempts meat/vegetables as much as possible;  snacks: crackers, cheese, sneaks cookie, husband brings sweets into house & patient struggles, describes self as "sugar-holic" ;  . Patient loves CGM sensor but encountered an issue where she downloaded a duplicate/similar (but incorrect) app, so would not scan. Resolved this in clinic today, applied new sample sensor, and CGM order through DME is pending.   Hypertension: . Controlled; losartan 97m daily;  . Current home readings: not currently checking . Denies hypotensive/hypertensive symptoms . Recommended continue current regimen  Hyperlipidemia: . Uncontrolled; atorvastatin 145m  . Assessed medication + current labs, consider increasing atorvastatin dose in future visits  Patient Goals/Self-Care Activities . Over the next 90 days, patient will:  take medications as prescribed and scan CGM 3x per day, for data to be discussed at future appointments   Follow Up Plan:  Telephone follow up appointment with care management team member scheduled for:  2 weeks to discuss CGM data     Medication Assistance: None required.  Patient affirms current coverage meets needs.  [facility]  [id number] [id number]: [phone]: [phone] [place] [address] [place]  [place] [address] Phone: [phone] Fax: [phone]   Follow Up:  Patient agrees to Care Plan and Follow-up.  Plan: Telephone follow up appointment with care management team member scheduled for:  2 wks to discuss CGM data  Darius Bump

## 2021-04-12 NOTE — Patient Instructions (Addendum)
Visit Information  Patient Goals/Self-Care Activities . Over the next 90 days, patient will:  take medications as prescribed and scan CGM 3x per day, for data to be discussed at future appointments   Follow Up Plan: Telephone follow up appointment with care management team member scheduled for:  2 weeks to discuss CGM data  Patient verbalizes understanding of instructions provided today and agrees to view in [place].   Telephone follow up appointment with care management team member scheduled for: 2 weeks to discuss CGM data  Jillian Huynh

## 2021-04-16 ENCOUNTER — Other Ambulatory Visit: Payer: Self-pay | Admitting: Family Medicine

## 2021-04-16 DIAGNOSIS — E118 Type 2 diabetes mellitus with unspecified complications: Secondary | ICD-10-CM

## 2021-04-17 ENCOUNTER — Telehealth: Payer: Medicare HMO

## 2021-04-24 ENCOUNTER — Ambulatory Visit: Payer: Medicare HMO | Admitting: Pharmacist

## 2021-04-24 DIAGNOSIS — E118 Type 2 diabetes mellitus with unspecified complications: Secondary | ICD-10-CM

## 2021-04-24 DIAGNOSIS — I10 Essential (primary) hypertension: Secondary | ICD-10-CM

## 2021-04-24 DIAGNOSIS — E785 Hyperlipidemia, unspecified: Secondary | ICD-10-CM | POA: Diagnosis not present

## 2021-04-24 NOTE — Patient Instructions (Signed)
Visit Information  PATIENT GOALS:  Goals Addressed             This Visit's Progress    Medication Management       Patient Goals/Self-Care Activities Over the next 90 days, patient will:  take medications as prescribed and scan CGM 3x per day, for data to be discussed at future appointments   Follow Up Plan: Telephone follow up appointment with care management team member scheduled for:  1 month to discuss CGM data         Patient verbalizes understanding of instructions provided today and agrees to view in [place].   Telephone follow up appointment with care management team member scheduled for: 1 month  Jillian Huynh

## 2021-04-24 NOTE — Progress Notes (Signed)
Chronic Care Management Pharmacy Note  04/24/2021 Name:  Jillian Huynh MRN:  [id number] DOB:  23-Nov-1945  Summary: evaluated CGM data, patient is loving her sensor. Insurance denies full coverage, patient wishes to pay for sensors at her pharmacy while appeal efforts continue.  Recommendations/Changes made from today's visit: great progress, increased time within goal BG range! Need RX sent for Freestyle Libre 2 to patient pharmacy during meantime while appeal is underway for insurance coverage through DME.  Plan: f/u with pharmacist via phone call in 1 month to discuss  Subjective: Jillian Huynh is an 74 y.o. year old female who is a primary patient of Metheney, Rene Kocher, MD.  The CCM team was consulted for assistance with disease management and care coordination needs.    Engaged with patient by telephone for follow up visit in response to provider referral for pharmacy case management and/or care coordination services.   Consent to Services:  The patient was given information about Chronic Care Management services, agreed to services, and gave verbal consent prior to initiation of services.  Please see initial visit note for detailed documentation.   Patient Care Team: [name], MD as PCP - General (Family Medicine) Darius Bump, [facility] as Pharmacist (Pharmacist)    Objective:  Lab Results  Component Value Date   CREATININE 0.59 (L) 12/26/2020   CREATININE 0.56 (L) 06/20/2020   CREATININE 0.59 (L) 08/25/2019    Lab Results  Component Value Date   HGBA1C 7.8 (A) 12/26/2020   Last diabetic Eye exam:  Lab Results  Component Value Date/Time   HMDIABEYEEXA No Retinopathy 06/26/2017 12:00 AM    Last diabetic Foot exam: No results found for: HMDIABFOOTEX      Component Value Date/Time   CHOL 174 06/20/2020 1040   TRIG 376 (H) 06/20/2020 1040   HDL 43 (L) 06/20/2020 1040   CHOLHDL 4.0 06/20/2020 1040   VLDL 57 (H) 09/19/2016 1057   LDLCALC 81 06/20/2020  1040    Hepatic Function Latest Ref Rng & Units 12/26/2020 06/20/2020 08/25/2019  Total Protein 6.1 - 8.1 g/dL 6.6 6.7 6.7  Albumin 3.6 - 5.1 g/dL - - -  AST 10 - 35 U/L 22 17 15   ALT 6 - 29 U/L 34(H) 17 18  Alk Phosphatase 33 - 130 U/L - - -  Total Bilirubin 0.2 - 1.2 mg/dL 1.2 0.7 1.1    Lab Results  Component Value Date/Time   TSH 1.99 07/09/2018 03:43 PM   TSH 2.672 09/28/2015 10:42 AM    CBC Latest Ref Rng & Units 12/26/2020 07/09/2018 03/24/2015  WBC 3.8 - 10.8 Thousand/uL 6.7 8.1 7.1  Hemoglobin 11.7 - 15.5 g/dL 12.7 12.3 12.5  Hematocrit 35.0 - 45.0 % 37.8 36.8 37.4  Platelets 140 - 400 Thousand/uL 254 285 269    Lab Results  Component Value Date/Time   VD25OH 26 (L) 03/24/2015 09:08 AM    Social History   Tobacco Use  Smoking Status Never  Smokeless Tobacco Never   BP Readings from Last 3 Encounters:  01/30/21 132/80  01/30/21 132/80  12/26/20 138/68   Pulse Readings from Last 3 Encounters:  01/30/21 71  01/30/21 71  12/26/20 90   Wt Readings from Last 3 Encounters:  01/30/21 195 lb (88.5 kg)  01/30/21 195 lb (88.5 kg)  12/26/20 197 lb (89.4 kg)    Assessment: Review of patient past medical history, allergies, medications, health status, including review of consultants reports, laboratory and other test data, was performed  as part of comprehensive evaluation and provision of chronic care management services.   SDOH:  (Social Determinants of Health) assessments and interventions performed:    CCM Care Plan  Allergies  Allergen Reactions   Bydureon [Exenatide] Nausea And Vomiting   Glipizide Other (See Comments)    Leg cramps, fatigue, and weight gain   Trulicity [Dulaglutide] Diarrhea   Invokana [Canagliflozin] Other (See Comments)    Yeast infection   Victoza [Liraglutide] Nausea Only and Other (See Comments)    Constipation,belching,bloating    Medications Reviewed Today     Reviewed by Darius Bump, [place] (Pharmacist) on 04/24/21 at 1313   Med List Status: <None>   Medication Order Taking? Sig Documenting Provider Last Dose Status Informant  atorvastatin (LIPITOR) 10 MG tablet [id number] Yes TAKE 1 TABLET BY MOUTH EVERYDAY AT BEDTIME [name], MD Taking Active   B-D ULTRAFINE III SHORT PEN 31G X 8 MM MISC [id number] Yes SMARTSIG:1 Each SUB-Q Daily [provider] Taking Active   Calcium Carbonate-Vit D-Min (CALCIUM 1200 PO) [id number] Yes Take by mouth. [provider] Taking Active   Cholecalciferol (VITAMIN D3) 400 UNITS CAPS [id number] Yes Take by mouth. [provider] Taking Active            Med Note Jamesetta Geralds Mar 21, 2020  9:43 AM)    Citicoline (COGNITIVE HEALTH) 500 MG CAPS [id number] Yes Take 1 capsule by mouth. [provider] Taking Active   FARXIGA 10 MG TABS tablet [id number] Yes TAKE 1 TABLET BY MOUTH DAILY BEFORE BREAKFAST. [name], MD Taking Active   glucose blood (ON CALL PLUS BLOOD GLUCOSE) test strip [id number] Yes Dx DM E11.65. Check fasting blood sugar every morning and 2 hours after large meals daily. Check 3 times daily [name], MD Taking Active   insulin degludec (TRESIBA FLEXTOUCH) 100 UNIT/ML FlexTouch Pen [id number] Yes Inject 10-25 Units into the skin daily. [name], MD Taking Active            Med Note Dorene Ar Apr 05, 2021  9:40 PM) Taking 50 unit daily as of 04/05/21  losartan (COZAAR) 25 MG tablet [id number] Yes TAKE 1/2 TABLET BY MOUTH EVERY DAY [name], MD Taking Active   metFORMIN (GLUCOPHAGE-XR) 500 MG 24 hr tablet [id number] Yes TAKE 2 TABLETS BY MOUTH EVERY DAY WITH BREAKFAST [name], MD Taking Active   pantoprazole (PROTONIX) 40 MG tablet [id number] Yes TAKE 1 TABLET BY MOUTH EVERY DAY [name], MD Taking Active   Probiotic Product (PROBIOTIC PO) [id number] Yes Take by mouth. [provider] Taking Active   rOPINIRole (REQUIP) 3 MG tablet  [id number] Yes TAKE 1 TABLET BY MOUTH EVERY DAY [name], MD Taking Active   vitamin E 100 UNIT capsule [id number] Yes Take 200 Units by mouth daily. [provider] Taking Active             Patient Active Problem List   Diagnosis Date Noted   Gastroesophageal reflux disease without esophagitis 09/19/2020   Severe obesity (BMI 35.0-39.9) with comorbidity ([place]) 05/25/2019   Fatty liver 12/25/2017   BP (high blood pressure) 03/28/2015   Dyslipidemia 03/28/2015   Insomnia 03/28/2015   Restless leg 03/28/2015   Mass of arm 03/14/2015   Diabetes mellitus type 2, controlled ([place]) 03/14/2015   Obstructive apnea 09/03/2012   HYPERKERATOSIS 04/01/2009    Immunization History  Administered Date(s) Administered  Moderna Sars-Covid-2 Vaccination 01/06/2020, 02/12/2020   Pneumococcal Conjugate-13 12/25/2017   Tdap 03/26/2007, 06/29/2015    Conditions to be addressed/monitored: HTN, HLD, and DMII  Care Plan : Medication Management  Updates made by Darius Bump, [place] since 04/24/2021 12:00 AM     Problem: HTN, DM, HLD   Priority: High     Long-Range Goal: Disease Progression Prevention   Recent Progress: On track  Priority: High  Note:   Current Barriers:  Unable to achieve control of diabetes   Pharmacist Clinical Goal(s):  Over the next 90 days, patient will achieve control of diabetes as evidenced by blood glucose values and A1c through collaboration with PharmD and provider.   Interventions: 1:1 collaboration with [name], MD regarding development and update of comprehensive plan of care as evidenced by provider attestation and co-signature Inter-disciplinary care team collaboration (see longitudinal plan of care) Comprehensive medication review performed; medication list updated in electronic medical record  Diabetes:  Uncontrolled; tresiba 40 units daily, increasing by 2units every other day for a goal AM fasting BG ~160, restarted  farxiga 04/05/21   Current glucose readings: fasting glucose: 270-300, postprandial BG 225 during office visit at 2pm  Reports minor hypoglycemic symptoms when in 130s, which is why pt decreased from 50 units to 40 units past several days  Previously discussed current meal patterns:  breakfast: oatmeal, eggs, "stays away from white bread";  lunch: ham & cheese 1/2 sandwhich (w/whole wheat), salad, apple;  dinner: pasta, potatoes, attempts meat/vegetables as much as possible;  snacks: crackers, cheese, sneaks cookie, husband brings sweets into house & patient struggles, describes self as "sugar-holic" ;   Date of Download: 04/24/21 % Time CGM is active: 81% Average Glucose: 195 mg/dL Glucose Management Indicator: 8% (~a1c rough estimate) Glucose Variability: 24.4% (goal <36%) Time in Goal:  - Time in range 70-180: 51% - Time above range: 49%% - Time below range: 0% Observed patterns: highest glucose elevations occur around 8-9pm   Celebrated patients progress!! Recommended continue current regimen, reinforced goal BG <130 for fasting (AM) glucose, and <180 for post-meal glucose.   Hypertension:  Controlled; losartan 33m daily;   Current home readings: not currently checking  Denies hypotensive/hypertensive symptoms  Recommended continue current regimen  Hyperlipidemia:  Uncontrolled; atorvastatin 146m   Assessed medication + current labs, consider increasing atorvastatin dose in future visits  Patient Goals/Self-Care Activities Over the next 90 days, patient will:  take medications as prescribed and scan CGM 3x per day, for data to be discussed at future appointments   Follow Up Plan: Telephone follow up appointment with care management team member scheduled for:  1 month to discuss CGM data     Medication Assistance: None required.  Patient affirms current coverage meets needs.  [facility]  [id number] [id number]: [phone]: [phone] [place] [place] [id number]: [phone]: [phone] Up:  Patient agrees to Care Plan and Follow-up.  Plan: Telephone follow up appointment with care management team member scheduled for:  1 month  KeeDarius Bump

## 2021-04-25 ENCOUNTER — Telehealth: Payer: Self-pay | Admitting: *Deleted

## 2021-04-25 ENCOUNTER — Other Ambulatory Visit: Payer: Self-pay | Admitting: Family Medicine

## 2021-04-25 DIAGNOSIS — I10 Essential (primary) hypertension: Secondary | ICD-10-CM

## 2021-04-25 NOTE — Telephone Encounter (Signed)
error 

## 2021-04-26 ENCOUNTER — Other Ambulatory Visit: Payer: Self-pay

## 2021-04-26 ENCOUNTER — Ambulatory Visit (INDEPENDENT_AMBULATORY_CARE_PROVIDER_SITE_OTHER): Payer: Medicare HMO

## 2021-04-26 ENCOUNTER — Other Ambulatory Visit: Payer: Self-pay | Admitting: *Deleted

## 2021-04-26 DIAGNOSIS — Z0000 Encounter for general adult medical examination without abnormal findings: Secondary | ICD-10-CM

## 2021-04-26 DIAGNOSIS — Z1231 Encounter for screening mammogram for malignant neoplasm of breast: Secondary | ICD-10-CM

## 2021-04-26 DIAGNOSIS — E118 Type 2 diabetes mellitus with unspecified complications: Secondary | ICD-10-CM

## 2021-05-08 ENCOUNTER — Other Ambulatory Visit: Payer: Self-pay

## 2021-05-08 ENCOUNTER — Encounter: Payer: Self-pay | Admitting: Family Medicine

## 2021-05-08 ENCOUNTER — Ambulatory Visit (INDEPENDENT_AMBULATORY_CARE_PROVIDER_SITE_OTHER): Payer: Medicare HMO | Admitting: Family Medicine

## 2021-05-08 VITALS — BP 132/52 | HR 72 | Ht 62.0 in | Wt 198.0 lb

## 2021-05-08 DIAGNOSIS — I10 Essential (primary) hypertension: Secondary | ICD-10-CM

## 2021-05-08 DIAGNOSIS — E118 Type 2 diabetes mellitus with unspecified complications: Secondary | ICD-10-CM

## 2021-05-08 LAB — POCT GLYCOSYLATED HEMOGLOBIN (HGB A1C): Hemoglobin A1C: 9.1 % — AB (ref 4.0–5.6)

## 2021-05-08 MED ORDER — TRESIBA FLEXTOUCH 100 UNIT/ML ~~LOC~~ SOPN: 50.0000 [IU] | PEN_INJECTOR | Freq: Every day | SUBCUTANEOUS | 1 refills | Status: DC

## 2021-05-08 MED ORDER — FREESTYLE LIBRE 2 SENSOR MISC: 5 refills | Status: DC

## 2021-05-08 NOTE — Assessment & Plan Note (Signed)
Uncontrolled.  A1c 9.1 today which went up significantly from 7.8 back in February.  Encouraged her to go back up to 50 units on her [place] Aas for now and will work on try to get her a new continuous glucose monitor.  Unfortunately she bumped her current device against the wall or door and broke it.

## 2021-05-08 NOTE — Assessment & Plan Note (Signed)
Blood pressure is okay today.  Still like to see that systolic just a little bit lower but overall okay.

## 2021-05-08 NOTE — Progress Notes (Signed)
Established Patient Office Visit  Subjective:  Patient ID: Jillian Huynh, female    DOB: [birth year]  Age: 75 y.o. MRN: [id number]  CC:  Chief Complaint  Patient presents with   Diabetes    HPI Jillian Huynh presents for   Diabetes - no hypoglycemic events. No wounds or sores that are not healing well. No increased thirst or urination. Checking glucose at home. Taking medications as prescribed without any side effects. She has been working with our clinical pharmacist to better manage her continuous glucose meter.  She said she was doing really well until about 2 weeks ago when she had the monitor on the side of the door/wall and it broke.  She said it was really helping her manage her blood sugars much better in fact she actually had a blood sugar of 135 1 morning.  Taking 40 units of Tresiba.  Still on metformin and Farxiga.    Hypertension- Pt denies chest pain, SOB, dizziness, or heart palpitations.  Taking meds as directed w/o problems.  Denies medication side effects.      Past Medical History:  Diagnosis Date   Diabetes mellitus without complication ([place])    Hyperlipidemia    Hypertension     Past Surgical History:  Procedure Laterality Date   CHOLECYSTECTOMY      Family History  Problem Relation Age of Onset   Diabetes Father    Cancer Father 8       Esophogeal Ca., Lung.   Hypertension Other    Thyroid disease Other    Colon cancer Daughter 53   Glaucoma Mother    Hepatitis C Mother 4       Died from.   Glaucoma Sister    Glaucoma Sister    Glaucoma Maternal Aunt     Social History   Socioeconomic History   Marital status: Married    Spouse name: Biochemist, clinical   Number of children: 3   Years of education: 16.5   Highest education level: Some college, no degree  Occupational History   Occupation: Hair Stylist  Tobacco Use   Smoking status: Never   Smokeless tobacco: Never  Substance and Sexual Activity   Alcohol use: No    Alcohol/week: 0.0  standard drinks   Drug use: No   Sexual activity: Yes    Partners: Male  Other Topics Concern   Not on file  Social History Narrative   Lives with her husband. She is working full time as a Probation officer. Two of her children live close by. One is in [place]. One of her daughter's is deceased. Her hobbies include watch tv, listening to music and reading.    Social Determinants of Health   Financial Resource Strain: Low Risk    Difficulty of Paying Living Expenses: Not hard at all  Food Insecurity: No Food Insecurity   Worried About Charity fundraiser in the Last Year: Never true   [place] in the Last Year: Never true  Transportation Needs: No Transportation Needs   Lack of Transportation (Medical): No   Lack of Transportation (Non-Medical): No  Physical Activity: Inactive   Days of Exercise per Week: 0 days   Minutes of Exercise per Session: 0 min  Stress: Stress Concern Present   Feeling of Stress : To some extent  Social Connections: Moderately Integrated   Frequency of Communication with Friends and Family: More than three times a week   Frequency of Social Gatherings with Friends and  Family: Once a week   Attends Religious Services: Never   Active Member of Clubs or Organizations: Yes   Attends Music therapist: More than 4 times per year   Marital Status: Married  Human resources officer Violence: Not At Risk   Fear of Current or Ex-Partner: No   Emotionally Abused: No   Physically Abused: No   Sexually Abused: No    Outpatient Medications Prior to Visit  Medication Sig Dispense Refill   atorvastatin (LIPITOR) 10 MG tablet TAKE 1 TABLET BY MOUTH EVERYDAY AT BEDTIME 90 tablet 3   B-D ULTRAFINE III SHORT PEN 31G X 8 MM MISC SMARTSIG:1 Each SUB-Q Daily     Calcium Carbonate-Vit D-Min (CALCIUM 1200 PO) Take by mouth.     FARXIGA 10 MG TABS tablet TAKE 1 TABLET BY MOUTH DAILY BEFORE BREAKFAST. 30 tablet 5   glucose blood (ON CALL PLUS BLOOD GLUCOSE) test  strip Dx DM E11.65. Check fasting blood sugar every morning and 2 hours after large meals daily. Check 3 times daily 300 each prn   losartan (COZAAR) 25 MG tablet TAKE 1/2 TABLET BY MOUTH EVERY DAY 45 tablet 3   metFORMIN (GLUCOPHAGE-XR) 500 MG 24 hr tablet TAKE 2 TABLETS BY MOUTH EVERY DAY WITH BREAKFAST 180 tablet 1   pantoprazole (PROTONIX) 40 MG tablet TAKE 1 TABLET BY MOUTH EVERY DAY 90 tablet 1   Probiotic Product (PROBIOTIC PO) Take by mouth.     rOPINIRole (REQUIP) 3 MG tablet TAKE 1 TABLET BY MOUTH EVERY DAY 90 tablet 1   vitamin E 100 UNIT capsule Take 200 Units by mouth daily.     Cholecalciferol (VITAMIN D3) 400 UNITS CAPS Take by mouth.     Citicoline (COGNITIVE HEALTH) 500 MG CAPS Take 1 capsule by mouth.     TRESIBA FLEXTOUCH 100 UNIT/ML FlexTouch Pen INJECT 10-25 UNITS INTO THE SKIN DAILY. 15 mL 1   No facility-administered medications prior to visit.    Allergies  Allergen Reactions   Bydureon [Exenatide] Nausea And Vomiting   Glipizide Other (See Comments)    Leg cramps, fatigue, and weight gain   Trulicity [Dulaglutide] Diarrhea   Invokana [Canagliflozin] Other (See Comments)    Yeast infection   Victoza [Liraglutide] Nausea Only and Other (See Comments)    Constipation,belching,bloating    ROS Review of Systems    Objective:    Physical Exam Constitutional:      Appearance: Normal appearance. She is well-developed.  HENT:     Head: Normocephalic and atraumatic.  Cardiovascular:     Rate and Rhythm: Normal rate and regular rhythm.     Heart sounds: Normal heart sounds.  Pulmonary:     Effort: Pulmonary effort is normal.     Breath sounds: Normal breath sounds.  Skin:    General: Skin is warm and dry.  Neurological:     Mental Status: She is alert and oriented to person, place, and time.  Psychiatric:        Behavior: Behavior normal.    BP (!) 132/52   Pulse 72   Ht 5\' 2"  (1.575 m)   Wt 198 lb (89.8 kg)   SpO2 97%   BMI 36.21 kg/m  Wt  Readings from Last 3 Encounters:  05/08/21 198 lb (89.8 kg)  01/30/21 195 lb (88.5 kg)  01/30/21 195 lb (88.5 kg)     Health Maintenance Due  Topic Date Due   Zoster Vaccines- Shingrix (1 of 2) Never done   OPHTHALMOLOGY EXAM  08/12/2018   COVID-19 Vaccine (3 - Booster for Moderna series) 07/14/2020    There are no preventive care reminders to display for this patient.  Lab Results  Component Value Date   TSH 1.99 07/09/2018   Lab Results  Component Value Date   WBC 6.7 12/26/2020   HGB 12.7 12/26/2020   HCT 37.8 12/26/2020   MCV 83.3 12/26/2020   PLT 254 12/26/2020   Lab Results  Component Value Date   NA 139 12/26/2020   K 4.1 12/26/2020   CO2 26 12/26/2020   GLUCOSE 190 (H) 12/26/2020   BUN 13 12/26/2020   CREATININE 0.59 (L) 12/26/2020   BILITOT 1.2 12/26/2020   ALKPHOS 78 09/19/2016   AST 22 12/26/2020   ALT 34 (H) 12/26/2020   PROT 6.6 12/26/2020   ALBUMIN 4.3 09/19/2016   CALCIUM 10.0 12/26/2020   Lab Results  Component Value Date   CHOL 174 06/20/2020   Lab Results  Component Value Date   HDL 43 (L) 06/20/2020   Lab Results  Component Value Date   LDLCALC 81 06/20/2020   Lab Results  Component Value Date   TRIG 376 (H) 06/20/2020   Lab Results  Component Value Date   CHOLHDL 4.0 06/20/2020   Lab Results  Component Value Date   HGBA1C 9.1 (A) 05/08/2021      Assessment & Plan:   Problem List Items Addressed This Visit       Cardiovascular and Mediastinum   BP (high blood pressure)    Blood pressure is okay today.  Still like to see that systolic just a little bit lower but overall okay.         Endocrine   Diabetes mellitus type 2, controlled ([place]) - Primary    Uncontrolled.  A1c 9.1 today which went up significantly from 7.8 back in February.  Encouraged her to go back up to 50 units on her Tyler Aas for now and will work on try to get her a new continuous glucose monitor.  Unfortunately she bumped her current device against  the wall or door and broke it.        Relevant Medications   insulin degludec (TRESIBA FLEXTOUCH) 100 UNIT/ML FlexTouch Pen   Other Relevant Orders   POCT glycosylated hemoglobin (Hb A1C) (Completed)    Meds ordered this encounter  Medications   insulin degludec (TRESIBA FLEXTOUCH) 100 UNIT/ML FlexTouch Pen    Sig: Inject 50-70 Units into the skin at bedtime.    Dispense:  30 mL    Refill:  1    Follow-up: Return in about 3 months (around 08/08/2021) for Diabetes follow-up.    Beatrice Lecher, MD

## 2021-06-05 ENCOUNTER — Telehealth: Payer: Medicare HMO

## 2021-06-26 ENCOUNTER — Ambulatory Visit (INDEPENDENT_AMBULATORY_CARE_PROVIDER_SITE_OTHER): Payer: Medicare HMO | Admitting: Pharmacist

## 2021-06-26 ENCOUNTER — Other Ambulatory Visit: Payer: Self-pay

## 2021-06-26 DIAGNOSIS — I10 Essential (primary) hypertension: Secondary | ICD-10-CM

## 2021-06-26 DIAGNOSIS — E118 Type 2 diabetes mellitus with unspecified complications: Secondary | ICD-10-CM

## 2021-06-26 DIAGNOSIS — E785 Hyperlipidemia, unspecified: Secondary | ICD-10-CM

## 2021-06-26 NOTE — Progress Notes (Signed)
Chronic Care Management Pharmacy Note  06/27/2021 Name:  Jillian Huynh MRN:  [id number] DOB:  1946-07-28  Summary: addressed HTN, HLD, and primarily DM.   Recommendations/Changes made from today's visit: Recommend pt come on-site for visit with pharmacist and bring proof of income paperwork - will initiate patient assistance for tresiba and rybelsus (ozempic currently on shortage).   Plan: f/u with pharmacist in 2 weeks  Subjective: Jillian Huynh is an 75 y.o. year old female who is a primary patient of Metheney, Rene Kocher, MD.  The CCM team was consulted for assistance with disease management and care coordination needs.    Engaged with patient by telephone for follow up visit in response to provider referral for pharmacy case management and/or care coordination services.   Consent to Services:  The patient was given information about Chronic Care Management services, agreed to services, and gave verbal consent prior to initiation of services.  Please see initial visit note for detailed documentation.   Patient Care Team: [name], MD as PCP - General (Family Medicine) Darius Bump, [facility] as Pharmacist (Pharmacist)  Objective:  Lab Results  Component Value Date   CREATININE 0.59 (L) 12/26/2020   CREATININE 0.56 (L) 06/20/2020   CREATININE 0.59 (L) 08/25/2019    Lab Results  Component Value Date   HGBA1C 9.1 (A) 05/08/2021   Last diabetic Eye exam:  Lab Results  Component Value Date/Time   HMDIABEYEEXA No Retinopathy 06/26/2017 12:00 AM    Last diabetic Foot exam: No results found for: HMDIABFOOTEX      Component Value Date/Time   CHOL 174 06/20/2020 1040   TRIG 376 (H) 06/20/2020 1040   HDL 43 (L) 06/20/2020 1040   CHOLHDL 4.0 06/20/2020 1040   VLDL 57 (H) 09/19/2016 1057   LDLCALC 81 06/20/2020 1040    Hepatic Function Latest Ref Rng & Units 12/26/2020 06/20/2020 08/25/2019  Total Protein 6.1 - 8.1 g/dL 6.6 6.7 6.7  Albumin 3.6 - 5.1 g/dL - - -  AST  10 - 35 U/L 22 17 15   ALT 6 - 29 U/L 34(H) 17 18  Alk Phosphatase 33 - 130 U/L - - -  Total Bilirubin 0.2 - 1.2 mg/dL 1.2 0.7 1.1    Lab Results  Component Value Date/Time   TSH 1.99 07/09/2018 03:43 PM   TSH 2.672 09/28/2015 10:42 AM    CBC Latest Ref Rng & Units 12/26/2020 07/09/2018 03/24/2015  WBC 3.8 - 10.8 Thousand/uL 6.7 8.1 7.1  Hemoglobin 11.7 - 15.5 g/dL 12.7 12.3 12.5  Hematocrit 35.0 - 45.0 % 37.8 36.8 37.4  Platelets 140 - 400 Thousand/uL 254 285 269    Lab Results  Component Value Date/Time   VD25OH 26 (L) 03/24/2015 09:08 AM    Clinical ASCVD: No  The 10-year ASCVD risk score Mikey Bussing DC Jr., et al., 2013) is: 34.9%   Values used to calculate the score:     Age: 75 years     Sex: Female     Is Non-Hispanic African American: No     Diabetic: Yes     Tobacco smoker: No     Systolic Blood Pressure: 462 mmHg     Is BP treated: Yes     HDL Cholesterol: 43 mg/dL     Total Cholesterol: 174 mg/dL     Social History   Tobacco Use  Smoking Status Never  Smokeless Tobacco Never   BP Readings from Last 3 Encounters:  05/08/21 (!) 132/52  01/30/21 132/80  01/30/21  132/80   Pulse Readings from Last 3 Encounters:  05/08/21 72  01/30/21 71  01/30/21 71   Wt Readings from Last 3 Encounters:  05/08/21 198 lb (89.8 kg)  01/30/21 195 lb (88.5 kg)  01/30/21 195 lb (88.5 kg)    Assessment: Review of patient past medical history, allergies, medications, health status, including review of consultants reports, laboratory and other test data, was performed as part of comprehensive evaluation and provision of chronic care management services.   SDOH:  (Social Determinants of Health) assessments and interventions performed:    CCM Care Plan  Allergies  Allergen Reactions   Bydureon [Exenatide] Nausea And Vomiting   Glipizide Other (See Comments)    Leg cramps, fatigue, and weight gain   Trulicity [Dulaglutide] Diarrhea   Invokana [Canagliflozin] Other (See  Comments)    Yeast infection   Victoza [Liraglutide] Nausea Only and Other (See Comments)    Constipation,belching,bloating    Medications Reviewed Today     Reviewed by Darius Bump, [place] (Pharmacist) on 06/26/21 at 1031  Med List Status: <None>   Medication Order Taking? Sig Documenting Provider Last Dose Status Informant  atorvastatin (LIPITOR) 10 MG tablet [id number] Yes TAKE 1 TABLET BY MOUTH EVERYDAY AT BEDTIME [name], MD Taking Active   B-D ULTRAFINE III SHORT PEN 31G X 8 MM MISC [id number] Yes SMARTSIG:1 Each SUB-Q Daily [provider] Taking Active   Calcium Carbonate-Vit D-Min (CALCIUM 1200 PO) [id number] Yes Take by mouth. [provider] Taking Active   Continuous Blood Gluc Sensor (FREESTYLE LIBRE 2 SENSOR) [place] [id number] Yes Sensors to be changed every 14 days. DX E11.8 [name], MD Taking Active   FARXIGA 10 MG TABS tablet [id number] Yes TAKE 1 TABLET BY MOUTH DAILY BEFORE BREAKFAST. [name], MD Taking Active   glucose blood (ON CALL PLUS BLOOD GLUCOSE) test strip [id number] Yes Dx DM E11.65. Check fasting blood sugar every morning and 2 hours after large meals daily. Check 3 times daily [name], MD Taking Active   insulin degludec (TRESIBA FLEXTOUCH) 100 UNIT/ML FlexTouch Pen [id number] Yes Inject 50-70 Units into the skin at bedtime. [name], MD Taking Active            Med Note Rikki Spearing Jun 26, 2021 10:31 AM) Taking 40 units daily as of 06/26/21  losartan (COZAAR) 25 MG tablet [id number] Yes TAKE 1/2 TABLET BY MOUTH EVERY DAY [name], MD Taking Active   metFORMIN (GLUCOPHAGE-XR) 500 MG 24 hr tablet [id number] Yes TAKE 2 TABLETS BY MOUTH EVERY DAY WITH BREAKFAST [name], MD Taking Active   pantoprazole (PROTONIX) 40 MG tablet [id number] Yes TAKE 1 TABLET BY MOUTH EVERY DAY [name], MD Taking Active   Probiotic Product (PROBIOTIC PO) [id number]  Yes Take by mouth. [provider] Taking Active   rOPINIRole (REQUIP) 3 MG tablet [id number] Yes TAKE 1 TABLET BY MOUTH EVERY DAY [name], MD Taking Active   vitamin E 100 UNIT capsule [id number] Yes Take 200 Units by mouth daily. [provider] Taking Active             Patient Active Problem List   Diagnosis Date Noted   Gastroesophageal reflux disease without esophagitis 09/19/2020   Severe obesity (BMI 35.0-39.9) with comorbidity ([place]) 05/25/2019   Fatty liver 12/25/2017   BP (high blood pressure) 03/28/2015   Dyslipidemia 03/28/2015   Insomnia 03/28/2015   Restless leg 03/28/2015  Mass of arm 03/14/2015   Diabetes mellitus type 2, controlled ([place]) 03/14/2015   Obstructive apnea 09/03/2012   HYPERKERATOSIS 04/01/2009    Immunization History  Administered Date(s) Administered   Moderna Sars-Covid-2 Vaccination 01/06/2020, 02/12/2020   Pneumococcal Conjugate-13 12/25/2017   Tdap 03/26/2007, 06/29/2015    Conditions to be addressed/monitored: HTN, HLD, and DMII  Care Plan : Medication Management  Updates made by Darius Bump, [place] since 06/27/2021 12:00 AM     Problem: HTN, DM, HLD   Priority: High     Long-Range Goal: Disease Progression Prevention   Recent Progress: On track  Priority: High  Note:   Current Barriers:  Unable to achieve control of diabetes   Pharmacist Clinical Goal(s):  Over the next 14 days, patient will achieve control of diabetes as evidenced by blood glucose values and A1c through collaboration with PharmD and provider.   Interventions: 1:1 collaboration with [name], MD regarding development and update of comprehensive plan of care as evidenced by provider attestation and co-signature Inter-disciplinary care team collaboration (see longitudinal plan of care) Comprehensive medication review performed; medication list updated in electronic medical record  Diabetes:  Uncontrolled; tresiba  40 units daily, increasing by 2units every other day for a goal AM fasting BG ~160, restarted farxiga 04/05/21   Current glucose readings: via CGM Date of Download: 06/26/21 % Time CGM is active: 85% Average Glucose: 171 mg/dL Glucose Management Indicator: 7.4 (~a1c rough estimate) Glucose Variability: 25.5 (goal <36%) Time in Goal:  - Time in range 70-180: 66% - Time above range: 34% - Time below range: 0% Observed patterns: In range majority of day with exception of about 5-10pm  Denies hyper/hypoglycemic symptoms  Previously discussed current meal patterns:  breakfast: oatmeal, eggs, "stays away from white bread";  lunch: ham & cheese 1/2 sandwhich (w/whole wheat), salad, apple;  dinner: pasta, potatoes, attempts meat/vegetables as much as possible;  snacks: crackers, cheese, sneaks cookie, husband brings sweets into house & patient struggles, describes self as "sugar-holic" ;   Celebrated patients progress!! Though, still not quite to a1c or BG goal. Reinforced goal BG <130 for fasting (AM) glucose, and <180 for post-meal glucose. Patient is a candidate for GLP1 in order to optimize glucose control as well as assisting with her personal goal of weight loss. Patient also expressed cost constraint with tresiba & farxiga. Patient IS eligible for tresiba patient assistance, but not farxiga. Recommend pt come on-site for visit with pharmacist and bring proof of income paperwork - will initiate patient assistance for tresiba and rybelsus (ozempic currently on shortage).    Hypertension:  Controlled; losartan 42m daily;   Current home readings: not currently checking  Denies hypotensive/hypertensive symptoms  Recommended continue current regimen  Hyperlipidemia:  Uncontrolled; atorvastatin 139m   Assessed medication + current labs, consider increasing atorvastatin dose in future visits  Patient Goals/Self-Care Activities Over the next 14 days, patient will:  Prepare and provide proof of  income paperwork for next pharmacist visit on-site Take medications as prescribed and scan CGM 3x per day, for data to be discussed at future appointments   Follow Up Plan: Face to face follow up appointment with care management team member scheduled for: 2 weeks     Medication Assistance:  TrTyler Aasnd Rybelsus assistance pending.  Patient's preferred pharmacy is:  [id number] [id number]: [phone]: [phone] [place]  [address] [place] [address] Phone: [phone] Fax: [phone]  Uses pill box? Yes Pt endorses 100% compliance  Follow Up:  Patient agrees to Care Plan and Follow-up.  Plan: Face to Face appointment with care management team member scheduled for: 2 weeks  Darius Bump

## 2021-06-27 NOTE — Patient Instructions (Signed)
Visit Information  PATIENT GOALS:  Goals Addressed             This Visit's Progress    Medication Management       Patient Goals/Self-Care Activities Over the next 14 days, patient will:  Prepare and provide proof of income paperwork for next pharmacist visit on-site Take medications as prescribed and scan CGM 3x per day, for data to be discussed at future appointments   Follow Up Plan: Face to face follow up appointment with care management team member scheduled for: 2 weeks        Patient verbalizes understanding of instructions provided today and agrees to view in [place].   Face to Face appointment with care management team member scheduled for:  2 weeks

## 2021-07-12 ENCOUNTER — Ambulatory Visit: Payer: Medicare HMO | Admitting: Pharmacist

## 2021-07-12 ENCOUNTER — Other Ambulatory Visit: Payer: Self-pay | Admitting: Family Medicine

## 2021-07-12 ENCOUNTER — Other Ambulatory Visit: Payer: Self-pay

## 2021-07-12 DIAGNOSIS — I10 Essential (primary) hypertension: Secondary | ICD-10-CM

## 2021-07-12 DIAGNOSIS — E118 Type 2 diabetes mellitus with unspecified complications: Secondary | ICD-10-CM

## 2021-07-12 DIAGNOSIS — E785 Hyperlipidemia, unspecified: Secondary | ICD-10-CM

## 2021-07-12 NOTE — Progress Notes (Signed)
Chronic Care Management Pharmacy Note  07/12/2021 Name:  Jillian Huynh MRN:  [id number] DOB:  1946/11/12  Summary: addressed HTN, HLD, primarily DM. Patient's glucose numbers showed a slight setback on progress, pt admits to a few eating habits here & there, as well as stress. Discussed importance of snacking throughout the day to avoid coming home from work at [employer] and run into trouble with BG ~8-9pm.   Recommendations/Changes made from today's visit: initiated rybelsus 69m daily, provided PAP forms for pt to fill out & return to front desk for me.  Plan: f/u with pharmacist in 2 weeks   Subjective: Jillian Sallasis an 75y.o. year old female who is a primary patient of [place], CRene Kocher MD.  The CCM team was consulted for assistance with disease management and care coordination needs.    Engaged with patient face to face for follow up visit in response to provider referral for pharmacy case management and/or care coordination services.   Consent to Services:  The patient was given information about Chronic Care Management services, agreed to services, and gave verbal consent prior to initiation of services.  Please see initial visit note for detailed documentation.   Patient Care Team: MHali Marry MD as PCP - General (Family Medicine) KDarius Bump [facility] Pharmacist (Pharmacist)    Objective:  Lab Results  Component Value Date   CREATININE 0.59 (L) 12/26/2020   CREATININE 0.56 (L) 06/20/2020   CREATININE 0.59 (L) 08/25/2019    Lab Results  Component Value Date   HGBA1C 9.1 (A) 05/08/2021   Last diabetic Eye exam:  Lab Results  Component Value Date/Time   HMDIABEYEEXA No Retinopathy 06/26/2017 12:00 AM    Last diabetic Foot exam: No results found for: HMDIABFOOTEX      Component Value Date/Time   CHOL 174 06/20/2020 1040   TRIG 376 (H) 06/20/2020 1040   HDL 43 (L) 06/20/2020 1040   CHOLHDL 4.0 06/20/2020 1040   VLDL 57 (H) 09/19/2016  1057   LDLCALC 81 06/20/2020 1040    Hepatic Function Latest Ref Rng & Units 12/26/2020 06/20/2020 08/25/2019  Total Protein 6.1 - 8.1 g/dL 6.6 6.7 6.7  Albumin 3.6 - 5.1 g/dL - - -  AST 10 - 35 U/L 22 17 15   ALT 6 - 29 U/L 34(H) 17 18  Alk Phosphatase 33 - 130 U/L - - -  Total Bilirubin 0.2 - 1.2 mg/dL 1.2 0.7 1.1    Lab Results  Component Value Date/Time   TSH 1.99 07/09/2018 03:43 PM   TSH 2.672 09/28/2015 10:42 AM    CBC Latest Ref Rng & Units 12/26/2020 07/09/2018 03/24/2015  WBC 3.8 - 10.8 Thousand/uL 6.7 8.1 7.1  Hemoglobin 11.7 - 15.5 g/dL 12.7 12.3 12.5  Hematocrit 35.0 - 45.0 % 37.8 36.8 37.4  Platelets 140 - 400 Thousand/uL 254 285 269    Lab Results  Component Value Date/Time   VD25OH 26 (L) 03/24/2015 09:08 AM     Social History   Tobacco Use  Smoking Status Never  Smokeless Tobacco Never   BP Readings from Last 3 Encounters:  05/08/21 (!) 132/52  01/30/21 132/80  01/30/21 132/80   Pulse Readings from Last 3 Encounters:  05/08/21 72  01/30/21 71  01/30/21 71   Wt Readings from Last 3 Encounters:  05/08/21 198 lb (89.8 kg)  01/30/21 195 lb (88.5 kg)  01/30/21 195 lb (88.5 kg)    Assessment: Review of patient past medical history,  allergies, medications, health status, including review of consultants reports, laboratory and other test data, was performed as part of comprehensive evaluation and provision of chronic care management services.   SDOH:  (Social Determinants of Health) assessments and interventions performed:    CCM Care Plan  Allergies  Allergen Reactions   Bydureon [Exenatide] Nausea And Vomiting   Glipizide Other (See Comments)    Leg cramps, fatigue, and weight gain   Trulicity [Dulaglutide] Diarrhea   Invokana [Canagliflozin] Other (See Comments)    Yeast infection   Victoza [Liraglutide] Nausea Only and Other (See Comments)    Constipation,belching,bloating    Medications Reviewed Today     Reviewed by Darius Bump,  [place] (Pharmacist) on 06/26/21 at 1031  Med List Status: <None>   Medication Order Taking? Sig Documenting Provider Last Dose Status Informant  atorvastatin (LIPITOR) 10 MG tablet [id number] Yes TAKE 1 TABLET BY MOUTH EVERYDAY AT BEDTIME [name], MD Taking Active   B-D ULTRAFINE III SHORT PEN 31G X 8 MM MISC [id number] Yes SMARTSIG:1 Each SUB-Q Daily [provider] Taking Active   Calcium Carbonate-Vit D-Min (CALCIUM 1200 PO) [id number] Yes Take by mouth. [provider] Taking Active   Continuous Blood Gluc Sensor (FREESTYLE LIBRE 2 SENSOR) [place] [id number] Yes Sensors to be changed every 14 days. DX E11.8 [name], MD Taking Active   FARXIGA 10 MG TABS tablet [id number] Yes TAKE 1 TABLET BY MOUTH DAILY BEFORE BREAKFAST. [name], MD Taking Active   glucose blood (ON CALL PLUS BLOOD GLUCOSE) test strip [id number] Yes Dx DM E11.65. Check fasting blood sugar every morning and 2 hours after large meals daily. Check 3 times daily [name], MD Taking Active   insulin degludec (TRESIBA FLEXTOUCH) 100 UNIT/ML FlexTouch Pen [id number] Yes Inject 50-70 Units into the skin at bedtime. [name], MD Taking Active            Med Note Rikki Spearing Jun 26, 2021 10:31 AM) Taking 40 units daily as of 06/26/21  losartan (COZAAR) 25 MG tablet [id number] Yes TAKE 1/2 TABLET BY MOUTH EVERY DAY [name], MD Taking Active   metFORMIN (GLUCOPHAGE-XR) 500 MG 24 hr tablet [id number] Yes TAKE 2 TABLETS BY MOUTH EVERY DAY WITH BREAKFAST [name], MD Taking Active   pantoprazole (PROTONIX) 40 MG tablet [id number] Yes TAKE 1 TABLET BY MOUTH EVERY DAY [name], MD Taking Active   Probiotic Product (PROBIOTIC PO) [id number] Yes Take by mouth. [provider] Taking Active   rOPINIRole (REQUIP) 3 MG tablet [id number] Yes TAKE 1 TABLET BY MOUTH EVERY DAY [name], MD Taking Active   vitamin E  100 UNIT capsule [id number] Yes Take 200 Units by mouth daily. [provider] Taking Active             Patient Active Problem List   Diagnosis Date Noted   Gastroesophageal reflux disease without esophagitis 09/19/2020   Severe obesity (BMI 35.0-39.9) with comorbidity ([place]) 05/25/2019   Fatty liver 12/25/2017   BP (high blood pressure) 03/28/2015   Dyslipidemia 03/28/2015   Insomnia 03/28/2015   Restless leg 03/28/2015   Mass of arm 03/14/2015   Diabetes mellitus type 2, controlled ([place]) 03/14/2015   Obstructive apnea 09/03/2012   HYPERKERATOSIS 04/01/2009    Immunization History  Administered Date(s) Administered   Moderna Sars-Covid-2 Vaccination 01/06/2020, 02/12/2020   Pneumococcal Conjugate-13 12/25/2017   Tdap 03/26/2007, 06/29/2015    Conditions to  be addressed/monitored: HTN, HLD, and DMII  Care Plan : Medication Management  Updates made by Darius Bump, [place] since 07/12/2021 12:00 AM     Problem: HTN, DM, HLD   Priority: High     Long-Range Goal: Disease Progression Prevention   Recent Progress: On track  Priority: High  Note:   Current Barriers:  Unable to achieve control of diabetes   Pharmacist Clinical Goal(s):  Over the next 14 days, patient will achieve control of diabetes as evidenced by blood glucose values and A1c through collaboration with PharmD and provider.   Interventions: 1:1 collaboration with [name], MD regarding development and update of comprehensive plan of care as evidenced by provider attestation and co-signature Inter-disciplinary care team collaboration (see longitudinal plan of care) Comprehensive medication review performed; medication list updated in electronic medical record  Diabetes:  Uncontrolled; tresiba 40 units daily, increasing by 2units every other day for a goal AM fasting BG ~160, restarted farxiga 04/05/21   Current glucose readings: via CGM Date of Download: 07/12/21 % Time CGM is  active: 84% Average Glucose: 197 mg/dL Glucose Management Indicator: 8.0%  Glucose Variability: 24.6 (goal <36%) Time in Goal:  - Time in range 70-180: 45% - Time above range: 55% - Time below range: 0% Observed patterns: Still with highest spikes ~9pm, glucose hits 270-300s.  Date of Download: 06/26/21 % Time CGM is active: 85% Average Glucose: 171 mg/dL Glucose Management Indicator: 7.4 (~a1c rough estimate) Glucose Variability: 25.5 (goal <36%) Time in Goal:  - Time in range 70-180: 66% - Time above range: 34% - Time below range: 0% Observed patterns: In range majority of day with exception of about 5-10pm  Denies hyper/hypoglycemic symptoms  Previously discussed current meal patterns:  breakfast: oatmeal, eggs, "stays away from white bread";  lunch: ham & cheese 1/2 sandwhich (w/whole wheat), salad, apple;  dinner: pasta, potatoes, attempts meat/vegetables as much as possible;  snacks: crackers, cheese, sneaks cookie, husband brings sweets into house & patient struggles, describes self as "sugar-holic" ;   Still not at goal based upon CGM data. Patient is a candidate for GLP1 in order to optimize glucose control as well as assisting with her personal goal of weight loss. Patient also expressed cost constraint with tresiba & farxiga. Patient IS eligible for tresiba & rybelsus patient assistance, but not farxiga. Initiated rybelsus 11m daily on empty stomach, via sample, provided novonordisk patient assistance form - patient will take home, fill out, & drop off at front desk for [place] complete & fax.    Hypertension:  Controlled; losartan 234mdaily;   Current home readings: not currently checking  Denies hypotensive/hypertensive symptoms  Recommended continue current regimen  Hyperlipidemia:  Uncontrolled; atorvastatin 1044m  Assessed medication + current labs, consider increasing atorvastatin dose in future visits  Patient Goals/Self-Care Activities Over the next 14 days,  patient will:  Take medications as prescribed and scan CGM 3x per day, for data to be discussed at future appointments   Follow Up Plan: Face to face follow up appointment with care management team member scheduled for: 2 weeks     Medication Assistance: Application for tresiba & rybelsus  medication assistance program. in process.  Anticipated assistance start date TBD.  See plan of care for additional detail.  Patient's preferred pharmacy is:  [id number] [id number]: [phone]: [phone]  [address] [place] [address] Phone: [phone] Fax: [phone]  Uses pill box? Yes Pt endorses 100% compliance  Follow Up:  Patient agrees to Care Plan and Follow-up.  Plan: Telephone follow up appointment with care management team member scheduled for:  2 weeks  Darius Bump

## 2021-07-12 NOTE — Patient Instructions (Signed)
Visit Information  PATIENT GOALS:  Goals Addressed             This Visit's Progress    Medication Management       Patient Goals/Self-Care Activities Over the next 14 days, patient will:  Take medications as prescribed and scan CGM 3x per day, for data to be discussed at future appointments   Follow Up Plan: Face to face follow up appointment with care management team member scheduled for: 2 weeks        Patient verbalizes understanding of instructions provided today and agrees to view in [place].   Telephone follow up appointment with care management team member scheduled for: 2 weeks  Jillian Huynh

## 2021-07-22 ENCOUNTER — Ambulatory Visit (INDEPENDENT_AMBULATORY_CARE_PROVIDER_SITE_OTHER): Payer: Medicare HMO

## 2021-07-22 ENCOUNTER — Other Ambulatory Visit: Payer: Self-pay

## 2021-07-22 DIAGNOSIS — Z0000 Encounter for general adult medical examination without abnormal findings: Secondary | ICD-10-CM

## 2021-07-22 NOTE — Patient Instructions (Signed)
[name] , Thank you for taking time to come for your Medicare Wellness Visit. I appreciate your ongoing commitment to your health goals. Please review the following plan we discussed and let me know if I can assist you in the future.   Screening recommendations/referrals: Colonoscopy: done 12/29/12 report every 10 years due 12/29/22 Mammogram: done 04/26/21 repeat every year Bone Density: done 03/19/21 repeat every 2 years Recommended yearly ophthalmology/optometry visit for glaucoma screening and checkup Recommended yearly dental visit for hygiene and checkup  Vaccinations: Influenza vaccine: due Pneumococcal vaccine: due Tdap vaccine: done 06/29/15 repeat every 10 years due 06/29/15 Shingles vaccine: Shingrix discussed. Please contact your pharmacy for coverage information.    Covid-19:completed 2/24 & 02/12/20  Advanced directives: Please bring a copy of your health care power of attorney and living will to the office at your convenience.  Conditions/risks identified: Lose 25 lbs  Next appointment: Follow up in one year for your annual wellness visit    Preventive Care 75 Years and Older, Female Preventive care refers to lifestyle choices and visits with your health care provider that can promote health and wellness. What does preventive care include? A yearly physical exam. This is also called an annual well check. Dental exams once or twice a year. Routine eye exams. Ask your health care provider how often you should have your eyes checked. Personal lifestyle choices, including: Daily care of your teeth and gums. Regular physical activity. Eating a healthy diet. Avoiding tobacco and drug use. Limiting alcohol use. Practicing safe sex. Taking low-dose aspirin every day. Taking vitamin and mineral supplements as recommended by your health care provider. What happens during an annual well check? The services and screenings done by your health care provider during your annual well  check will depend on your age, overall health, lifestyle risk factors, and family history of disease. Counseling  Your health care provider may ask you questions about your: Alcohol use. Tobacco use. Drug use. Emotional well-being. Home and relationship well-being. Sexual activity. Eating habits. History of falls. Memory and ability to understand (cognition). Work and work Statistician. Reproductive health. Screening  You may have the following tests or measurements: Height, weight, and BMI. Blood pressure. Lipid and cholesterol levels. These may be checked every 5 years, or more frequently if you are over 75 years old. Skin check. Lung cancer screening. You may have this screening every year starting at age 75 if you have a 30-pack-year history of smoking and currently smoke or have quit within the past 15 years. Fecal occult blood test (FOBT) of the stool. You may have this test every year starting at age 75. Flexible sigmoidoscopy or colonoscopy. You may have a sigmoidoscopy every 5 years or a colonoscopy every 10 years starting at age 75. Hepatitis C blood test. Hepatitis B blood test. Sexually transmitted disease (STD) testing. Diabetes screening. This is done by checking your blood sugar (glucose) after you have not eaten for a while (fasting). You may have this done every 1-3 years. Bone density scan. This is done to screen for osteoporosis. You may have this done starting at age 75. Mammogram. This may be done every 1-2 years. Talk to your health care provider about how often you should have regular mammograms. Talk with your health care provider about your test results, treatment options, and if necessary, the need for more tests. Vaccines  Your health care provider may recommend certain vaccines, such as: Influenza vaccine. This is recommended every year. Tetanus, diphtheria, and acellular pertussis (  Tdap, Td) vaccine. You may need a Td booster every 10 years. Zoster  vaccine. You may need this after age 75. Pneumococcal 13-valent conjugate (PCV13) vaccine. One dose is recommended after age 75. Pneumococcal polysaccharide (PPSV23) vaccine. One dose is recommended after age 75. Talk to your health care provider about which screenings and vaccines you need and how often you need them. This information is not intended to replace advice given to you by your health care provider. Make sure you discuss any questions you have with your health care provider. Document Released: 11/25/2015 Document Revised: 07/18/2016 Document Reviewed: 08/30/2015 Elsevier Interactive Patient Education  2017 [employer] Prevention in the Home Falls can cause injuries. They can happen to people of all ages. There are many things you can do to make your home safe and to help prevent falls. What can I do on the outside of my home? Regularly fix the edges of walkways and driveways and fix any cracks. Remove anything that might make you trip as you walk through a door, such as a raised step or threshold. Trim any bushes or trees on the path to your home. Use bright outdoor lighting. Clear any walking paths of anything that might make someone trip, such as rocks or tools. Regularly check to see if handrails are loose or broken. Make sure that both sides of any steps have handrails. Any raised decks and porches should have guardrails on the edges. Have any leaves, snow, or ice cleared regularly. Use sand or salt on walking paths during winter. Clean up any spills in your garage right away. This includes oil or grease spills. What can I do in the bathroom? Use night lights. Install grab bars by the toilet and in the tub and shower. Do not use towel bars as grab bars. Use non-skid mats or decals in the tub or shower. If you need to sit down in the shower, use a plastic, non-slip stool. Keep the floor dry. Clean up any water that spills on the floor as soon as it happens. Remove  soap buildup in the tub or shower regularly. Attach bath mats securely with double-sided non-slip rug tape. Do not have throw rugs and other things on the floor that can make you trip. What can I do in the bedroom? Use night lights. Make sure that you have a light by your bed that is easy to reach. Do not use any sheets or blankets that are too big for your bed. They should not hang down onto the floor. Have a firm chair that has side arms. You can use this for support while you get dressed. Do not have throw rugs and other things on the floor that can make you trip. What can I do in the kitchen? Clean up any spills right away. Avoid walking on wet floors. Keep items that you use a lot in easy-to-reach places. If you need to reach something above you, use a strong step stool that has a grab bar. Keep electrical cords out of the way. Do not use floor polish or wax that makes floors slippery. If you must use wax, use non-skid floor wax. Do not have throw rugs and other things on the floor that can make you trip. What can I do with my stairs? Do not leave any items on the stairs. Make sure that there are handrails on both sides of the stairs and use them. Fix handrails that are broken or loose. Make sure that handrails are  as long as the stairways. Check any carpeting to make sure that it is firmly attached to the stairs. Fix any carpet that is loose or worn. Avoid having throw rugs at the top or bottom of the stairs. If you do have throw rugs, attach them to the floor with carpet tape. Make sure that you have a light switch at the top of the stairs and the bottom of the stairs. If you do not have them, ask someone to add them for you. What else can I do to help prevent falls? Wear shoes that: Do not have high heels. Have rubber bottoms. Are comfortable and fit you well. Are closed at the toe. Do not wear sandals. If you use a stepladder: Make sure that it is fully opened. Do not climb a  closed stepladder. Make sure that both sides of the stepladder are locked into place. Ask someone to hold it for you, if possible. Clearly mark and make sure that you can see: Any grab bars or handrails. First and last steps. Where the edge of each step is. Use tools that help you move around (mobility aids) if they are needed. These include: Canes. Walkers. Scooters. Crutches. Turn on the lights when you go into a dark area. Replace any light bulbs as soon as they burn out. Set up your furniture so you have a clear path. Avoid moving your furniture around. If any of your floors are uneven, fix them. If there are any pets around you, be aware of where they are. Review your medicines with your doctor. Some medicines can make you feel dizzy. This can increase your chance of falling. Ask your doctor what other things that you can do to help prevent falls. This information is not intended to replace advice given to you by your health care provider. Make sure you discuss any questions you have with your health care provider. Document Released: 08/25/2009 Document Revised: 04/05/2016 Document Reviewed: 12/03/2014 Elsevier Interactive Patient Education  2017 [employer].

## 2021-07-22 NOTE — Progress Notes (Addendum)
Virtual Visit via Telephone Note  I connected with  Rosalita Chessman on 07/22/21 at  2:00 PM EDT by telephone and verified that I am speaking with the correct person using two identifiers.  Medicare Annual Wellness visit completed telephonically due to Covid-19 pandemic.   Persons participating in this call: This Health Coach and this patient.   Location: Patient: home Provider: office   I discussed the limitations, risks, security and privacy concerns of performing an evaluation and management service by telephone and the availability of in person appointments. The patient expressed understanding and agreed to proceed.  Unable to perform video visit due to video visit attempted and failed and/or patient does not have video capability.   Some vital signs may be absent or patient reported.   Willette Brace, LPN   Subjective:   Tywanda Lehnert is a 75 y.o. female who presents for an Initial Medicare Annual Wellness Visit.  Review of Systems     Cardiac Risk Factors include: advanced age (>34mn, >>90women);diabetes mellitus;hypertension;dyslipidemia;obesity (BMI >30kg/m2)     Objective:    There were no vitals filed for this visit. There is no height or weight on file to calculate BMI.  Advanced Directives 07/22/2021 01/30/2021 12/26/2016 03/28/2015  Does Patient Have a Medical Advance Directive? Yes Yes Yes Yes  Type of [place]aramed[place]of [place] will Living will  Does patient want to make changes to medical advance directive? - No - Patient declined No - Patient declined No - Patient declined  Copy of [place] Chart? No - copy requested No - copy requested No - copy requested No - copy requested    Current Medications (verified) Outpatient Encounter Medications as of 07/22/2021  Medication Sig   atorvastatin (LIPITOR) 10 MG tablet TAKE 1 TABLET BY MOUTH EVERYDAY AT  BEDTIME   B-D ULTRAFINE III SHORT PEN 31G X 8 MM MISC SMARTSIG:1 Each SUB-Q Daily   Calcium Carbonate-Vit D-Min (CALCIUM 1200 PO) Take by mouth.   Continuous Blood Gluc Sensor (FREESTYLE LIBRE 2 SENSOR) MISC Sensors to be changed every 14 days. DX E11.8   FARXIGA 10 MG TABS tablet TAKE 1 TABLET BY MOUTH DAILY BEFORE BREAKFAST.   glucose blood (ON CALL PLUS BLOOD GLUCOSE) test strip Dx DM E11.65. Check fasting blood sugar every morning and 2 hours after large meals daily. Check 3 times daily   insulin degludec (TRESIBA FLEXTOUCH) 100 UNIT/ML FlexTouch Pen Inject 50-70 Units into the skin at bedtime. (Patient taking differently: Inject 50-70 Units into the skin at bedtime. Taking 45 units)   losartan (COZAAR) 25 MG tablet TAKE 1/2 TABLET BY MOUTH EVERY DAY   metFORMIN (GLUCOPHAGE-XR) 500 MG 24 hr tablet TAKE 2 TABLETS BY MOUTH EVERY DAY WITH BREAKFAST   pantoprazole (PROTONIX) 40 MG tablet TAKE 1 TABLET BY MOUTH EVERY DAY   Probiotic Product (PROBIOTIC PO) Take by mouth.   Semaglutide (RYBELSUS) 3 MG TABS Take by mouth.   vitamin E 100 UNIT capsule Take 200 Units by mouth daily.   [DISCONTINUED] rOPINIRole (REQUIP) 3 MG tablet TAKE 1 TABLET BY MOUTH EVERY DAY (Patient not taking: Reported on 07/22/2021)   No facility-administered encounter medications on file as of 07/22/2021.    Allergies (verified) Bydureon [exenatide], Glipizide, Trulicity [dulaglutide], Invokana [canagliflozin], and Victoza [liraglutide]   History: Past Medical History:  Diagnosis Date   Diabetes mellitus without complication ([place]    Hyperlipidemia    Hypertension  Past Surgical History:  Procedure Laterality Date   CHOLECYSTECTOMY     Family History  Problem Relation Age of Onset   Diabetes Father    Cancer Father 24       Esophogeal Ca., Lung.   Hypertension Other    Thyroid disease Other    Colon cancer Daughter 64   Glaucoma Mother    Hepatitis C Mother 6       Died from.   Glaucoma Sister     Glaucoma Sister    Glaucoma Maternal Aunt    Social History   Socioeconomic History   Marital status: Married    Spouse name: Biochemist, clinical   Number of children: 3   Years of education: 16.5   Highest education level: Some college, no degree  Occupational History   Occupation: Hair Stylist  Tobacco Use   Smoking status: Never   Smokeless tobacco: Never  Substance and Sexual Activity   Alcohol use: No    Alcohol/week: 0.0 standard drinks   Drug use: No   Sexual activity: Yes    Partners: Male  Other Topics Concern   Not on file  Social History Narrative   Lives with her husband. She is working full time as a Probation officer. Two of her children live close by. One is in [place]. One of her daughter's is deceased. Her hobbies include watch tv, listening to music and reading.    Social Determinants of Health   Financial Resource Strain: Low Risk    Difficulty of Paying Living Expenses: Not hard at all  Food Insecurity: No Food Insecurity   Worried About Charity fundraiser in the Last Year: Never true   [place] in the Last Year: Never true  Transportation Needs: No Transportation Needs   Lack of Transportation (Medical): No   Lack of Transportation (Non-Medical): No  Physical Activity: Inactive   Days of Exercise per Week: 0 days   Minutes of Exercise per Session: 0 min  Stress: No Stress Concern Present   Feeling of Stress : Not at all  Social Connections: Moderately Integrated   Frequency of Communication with Friends and Family: More than three times a week   Frequency of Social Gatherings with Friends and Family: More than three times a week   Attends Religious Services: Never   Marine scientist or Organizations: Yes   Attends Archivist Meetings: 1 to 4 times per year   Marital Status: Married    Tobacco Counseling Counseling given: Not Answered   Clinical Intake:  Pre-visit preparation completed: Yes  Pain : No/denies pain     BMI  - recorded: 36.21 Nutritional Status: BMI > 30  Obese Nutritional Risks: Other (Comment) (stomach issues at times) Diabetes: Yes CBG done?: Yes (136) CBG resulted in Enter/ Edit results?: No Did pt. bring in CBG monitor from home?: No  How often do you need to have someone help you when you read instructions, pamphlets, or other written materials from your doctor or pharmacy?: 1 - Never  Diabetic?Nutrition Risk Assessment:  Has the patient had any N/V/D within the last 2 months?  Yes  Does the patient have any non-healing wounds?  No  Has the patient had any unintentional weight loss or weight gain?  No   Diabetes:  Is the patient diabetic?  Yes  If diabetic, was a CBG obtained today?  Yes  Did the patient bring in their glucometer from home?  No  How  often do you monitor your CBG's? Daily.   Financial Strains and Diabetes Management:  Are you having any financial strains with the device, your supplies or your medication? No .  Does the patient want to be seen by Chronic Care Management for management of their diabetes?  No  Would the patient like to be referred to a Nutritionist or for Diabetic Management?  No   Diabetic Exams:  Diabetic Eye Exam: Overdue for diabetic eye exam. Pt has been advised about the importance in completing this exam. Patient advised to call and schedule an eye exam. Diabetic Foot Exam: Overdue, Pt has been advised about the importance in completing this exam. Pt is scheduled for diabetic foot exam on next appt .   Interpreter Needed?: No  Information entered by :: Charlott Rakes, LPN   Activities of Daily Living In your present state of health, do you have any difficulty performing the following activities: 07/22/2021 01/30/2021  Hearing? Y Y  Comment - sometimes.  Vision? N N  Difficulty concentrating or making decisions? N N  Walking or climbing stairs? Y N  Comment legs get tired -  Dressing or bathing? N N  Doing errands, shopping? N N   Preparing Food and eating ? N N  Using the Toilet? N N  In the past six months, have you accidently leaked urine? Y N  Comment urgency at times -  Do you have problems with loss of bowel control? N N  Managing your Medications? N N  Managing your Finances? N N  Housekeeping or managing your Housekeeping? N N  Some recent data might be hidden    Patient Care Team: [name], MD as PCP - General (Family Medicine) Darius Bump, [facility] as Pharmacist (Pharmacist)  Indicate any recent Medical Services you may have received from other than [facility] providers in the past year (date may be approximate).     Assessment:   This is a routine wellness examination for [place].  Hearing/Vision screen Hearing Screening - Comments:: Pt stated HOH Vision Screening - Comments:: Encouraged pt to follow up for eye exams   Dietary issues and exercise activities discussed: Current Exercise Habits: The patient does not participate in regular exercise at present   Goals Addressed             This Visit's Progress    Patient Stated       Lose weight 25 lbs       Depression Screen PHQ 2/9 Scores 07/22/2021 05/08/2021 01/30/2021 09/19/2020 08/25/2019 02/18/2019 10/08/2018  PHQ - 2 Score 0 0 0 0 0 0 1  PHQ- 9 Score - - - - - - -    Fall Risk Fall Risk  07/22/2021 05/08/2021 01/30/2021 12/26/2020 12/09/2019  Falls in the past year? 0 0 0 0 0  Number falls in past yr: 0 0 0 - 0  Injury with Fall? 0 0 0 - 0  Risk for fall due to : - No Fall Risks No Fall Risks No Fall Risks -  Risk for fall due to: Comment - - - - -  Follow up Falls prevention discussed Falls evaluation completed;Falls prevention discussed Falls evaluation completed - -    FALL RISK PREVENTION PERTAINING TO THE HOME:  Any stairs in or around the home? No  If so, are there any without handrails? No  Home free of loose throw rugs in walkways, pet beds, electrical cords, etc? Yes  Adequate lighting in your home to reduce  risk  of falls? Yes   ASSISTIVE DEVICES UTILIZED TO PREVENT FALLS:  Life alert? No  Use of a cane, walker or w/c? No  Grab bars in the bathroom? Yes  Shower chair or bench in shower? No  Elevated toilet seat or a handicapped toilet? No   TIMED UP AND GO:  Was the test performed? No .   Cognitive Function:     6CIT Screen 07/22/2021 01/30/2021 12/26/2016  What Year? 0 points 0 points 0 points  What month? 0 points 0 points 0 points  What time? 0 points 0 points 0 points  Count back from 20 0 points 2 points 0 points  Months in reverse 4 points 0 points 0 points  Repeat phrase 0 points 2 points 0 points  Total Score 4 4 0    Immunizations Immunization History  Administered Date(s) Administered   Moderna Sars-Covid-2 Vaccination 01/06/2020, 02/12/2020   Pneumococcal Conjugate-13 12/25/2017   Tdap 03/26/2007, 06/29/2015    TDAP status: Up to date  Flu Vaccine status: Due, Education has been provided regarding the importance of this vaccine. Advised may receive this vaccine at local pharmacy or Health Dept. Aware to provide a copy of the vaccination record if obtained from local pharmacy or Health Dept. Verbalized acceptance and understanding.  Pneumococcal vaccine status: Due, Education has been provided regarding the importance of this vaccine. Advised may receive this vaccine at local pharmacy or Health Dept. Aware to provide a copy of the vaccination record if obtained from local pharmacy or Health Dept. Verbalized acceptance and understanding.  Covid-19 vaccine status: Completed vaccines  Qualifies for Shingles Vaccine? Yes   Zostavax completed No   Shingrix Completed?: No.    Education has been provided regarding the importance of this vaccine. Patient has been advised to call insurance company to determine out of pocket expense if they have not yet received this vaccine. Advised may also receive vaccine at local pharmacy or Health Dept. Verbalized acceptance and  understanding.  Screening Tests Health Maintenance  Topic Date Due   OPHTHALMOLOGY EXAM  08/12/2018   FOOT EXAM  08/24/2020   COVID-19 Vaccine (3 - Booster for Moderna series) 08/07/2021 (Originally 07/14/2020)   Zoster Vaccines- Shingrix (1 of 2) 10/21/2021 (Originally 11/05/1996)   PNA vac Low Risk Adult (2 of 2 - PPSV23) 01/30/2022 (Originally 12/25/2018)   INFLUENZA VACCINE  02/09/2022 (Originally 06/12/2021)   HEMOGLOBIN A1C  11/07/2021   COLONOSCOPY (Pts 45-80yr Insurance coverage will need to be confirmed)  12/29/2022   MAMMOGRAM  04/27/2023   TETANUS/TDAP  06/28/2025   DEXA SCAN  Completed   Hepatitis C Screening  Completed   HPV VACCINES  Aged Out    Health Maintenance  Health Maintenance Due  Topic Date Due   OPHTHALMOLOGY EXAM  08/12/2018   FOOT EXAM  08/24/2020    Colorectal cancer screening: Type of screening: Colonoscopy. Completed 12/19/12. Repeat every 10 years  Mammogram status: Completed 04/26/21. Repeat every year  Bone Density status: Completed 03/29/21. Results reflect: Bone density results: OSTEOPENIA. Repeat every 2 years.    Additional Screening:  Hepatitis C Screening: Completed 09/19/16  Vision Screening: Recommended annual ophthalmology exams for early detection of glaucoma and other disorders of the eye. Is the patient up to date with their annual eye exam?  No  Who is the provider or what is the name of the office in which the patient attends annual eye exams? Encouraged to follow up for eye care  If pt is not established  with a provider, would they like to be referred to a provider to establish care? No .   Dental Screening: Recommended annual dental exams for proper oral hygiene  Community Resource Referral / Chronic Care Management: CRR required this visit?  No   CCM required this visit?  No      Plan:     I have personally reviewed and noted the following in the patient's chart:   Medical and social history Use of alcohol, tobacco or  illicit drugs  Current medications and supplements including opioid prescriptions. Patient is not currently taking opioid prescriptions. Functional ability and status Nutritional status Physical activity Advanced directives List of other physicians Hospitalizations, surgeries, and ER visits in previous 12 months Vitals Screenings to include cognitive, depression, and falls Referrals and appointments  In addition, I have reviewed and discussed with patient certain preventive protocols, quality metrics, and best practice recommendations. A written personalized care plan for preventive services as well as general preventive health recommendations were provided to patient.     Willette Brace, LPN   [id number]   Nurse Notes: none

## 2021-07-24 ENCOUNTER — Other Ambulatory Visit: Payer: Self-pay

## 2021-07-24 ENCOUNTER — Ambulatory Visit (INDEPENDENT_AMBULATORY_CARE_PROVIDER_SITE_OTHER): Payer: Medicare HMO | Admitting: Pharmacist

## 2021-07-24 DIAGNOSIS — I10 Essential (primary) hypertension: Secondary | ICD-10-CM

## 2021-07-24 DIAGNOSIS — E118 Type 2 diabetes mellitus with unspecified complications: Secondary | ICD-10-CM

## 2021-07-24 DIAGNOSIS — E785 Hyperlipidemia, unspecified: Secondary | ICD-10-CM

## 2021-07-24 NOTE — Progress Notes (Signed)
Chronic Care Management Pharmacy Note  07/25/2021 Name:  Jillian Huynh MRN:  [id number] DOB:  1946-01-11  Summary: addressed HTN, HLD, and primarily DM. Reports 2-3 days worth of nausea with new rybelsus, but not severe enough that she wants to stop it. She is still working on paperwork to obtain rybelsus through [employer] patient assistance.   Recommendations/Changes made from today's visit: continue current regimen, patient may need another rybelsus 34m 30 day sample at next PCP visit since paperwork (& subsequent patient assistance drug supply) will not be completed in time.   Plan: f/u with pharmacist in 1 month  Subjective: Jillian Walsworthis an 75y.o. year old female who is a primary patient of Metheney, CRene Kocher MD.  The CCM team was consulted for assistance with disease management and care coordination needs.    Engaged with patient by telephone for follow up visit in response to provider referral for pharmacy case management and/or care coordination services.   Consent to Services:  The patient was given information about Chronic Care Management services, agreed to services, and gave verbal consent prior to initiation of services.  Please see initial visit note for detailed documentation.   Patient Care Team: MHali Marry MD as PCP - General (Family Medicine) KDarius Bump [facility] Pharmac[facility] (Pharmacist)   Objective:  Lab Results  Component Value Date   CREATININE 0.59 (L) 12/26/2020   CREATININE 0.56 (L) 06/20/2020   CREATININE 0.59 (L) 08/25/2019    Lab Results  Component Value Date   HGBA1C 9.1 (A) 05/08/2021   Last diabetic Eye exam:  Lab Results  Component Value Date/Time   HMDIABEYEEXA No Retinopathy 06/26/2017 12:00 AM    Last diabetic Foot exam: No results found for: HMDIABFOOTEX      Component Value Date/Time   CHOL 174 06/20/2020 1040   TRIG 376 (H) 06/20/2020 1040   HDL 43 (L) 06/20/2020 1040   CHOLHDL 4.0 06/20/2020 1040   VLDL 57  (H) 09/19/2016 1057   LDLCALC 81 06/20/2020 1040    Hepatic Function Latest Ref Rng & Units 12/26/2020 06/20/2020 08/25/2019  Total Protein 6.1 - 8.1 g/dL 6.6 6.7 6.7  Albumin 3.6 - 5.1 g/dL - - -  AST 10 - 35 U/L 22 17 15   ALT 6 - 29 U/L 34(H) 17 18  Alk Phosphatase 33 - 130 U/L - - -  Total Bilirubin 0.2 - 1.2 mg/dL 1.2 0.7 1.1    Lab Results  Component Value Date/Time   TSH 1.99 07/09/2018 03:43 PM   TSH 2.672 09/28/2015 10:42 AM    CBC Latest Ref Rng & Units 12/26/2020 07/09/2018 03/24/2015  WBC 3.8 - 10.8 Thousand/uL 6.7 8.1 7.1  Hemoglobin 11.7 - 15.5 g/dL 12.7 12.3 12.5  Hematocrit 35.0 - 45.0 % 37.8 36.8 37.4  Platelets 140 - 400 Thousand/uL 254 285 269    Lab Results  Component Value Date/Time   VD25OH 26 (L) 03/24/2015 09:08 AM    Clinical ASCVD: Yes  The 10-year ASCVD risk score (Arnett DK, et al., 2019) is: 34.9%   Values used to calculate the score:     Age: 75years     Sex: Female     Is Non-Hispanic African American: No     Diabetic: Yes     Tobacco smoker: No     Systolic Blood Pressure: 1751mmHg     Is BP treated: Yes     HDL Cholesterol: 43 mg/dL     Total Cholesterol: 174  mg/dL    Other: (CHADS2VASc if Afib, PHQ9 if depression, MMRC or CAT for COPD, ACT, DEXA)  Social History   Tobacco Use  Smoking Status Never  Smokeless Tobacco Never   BP Readings from Last 3 Encounters:  05/08/21 (!) 132/52  01/30/21 132/80  01/30/21 132/80   Pulse Readings from Last 3 Encounters:  05/08/21 72  01/30/21 71  01/30/21 71   Wt Readings from Last 3 Encounters:  05/08/21 198 lb (89.8 kg)  01/30/21 195 lb (88.5 kg)  01/30/21 195 lb (88.5 kg)    Assessment: Review of patient past medical history, allergies, medications, health status, including review of consultants reports, laboratory and other test data, was performed as part of comprehensive evaluation and provision of chronic care management services.   SDOH:  (Social Determinants of Health)  assessments and interventions performed:    CCM Care Plan  Allergies  Allergen Reactions   Bydureon [Exenatide] Nausea And Vomiting   Glipizide Other (See Comments)    Leg cramps, fatigue, and weight gain   Trulicity [Dulaglutide] Diarrhea   Invokana [Canagliflozin] Other (See Comments)    Yeast infection   Victoza [Liraglutide] Nausea Only and Other (See Comments)    Constipation,belching,bloating    Medications Reviewed Today     Reviewed by Willette Brace, LPN (Licensed Practical Nurse) on 07/22/21 at 1032  Med List Status: <None>   Medication Order Taking? Sig Documenting Provider Last Dose Status Informant  atorvastatin (LIPITOR) 10 MG tablet [id number] Yes TAKE 1 TABLET BY MOUTH EVERYDAY AT BEDTIME [name], MD Taking Active   B-D ULTRAFINE III SHORT PEN 31G X 8 MM MISC [id number] Yes SMARTSIG:1 Each SUB-Q Daily [provider] Taking Active   Calcium Carbonate-Vit D-Min (CALCIUM 1200 PO) [id number] Yes Take by mouth. [provider] Taking Active   Continuous Blood Gluc Sensor (FREESTYLE LIBRE 2 SENSOR) [place] [id number] Yes Sensors to be changed every 14 days. DX E11.8 [name], MD Taking Active   FARXIGA 10 MG TABS tablet [id number] Yes TAKE 1 TABLET BY MOUTH DAILY BEFORE BREAKFAST. [name], MD Taking Active   glucose blood (ON CALL PLUS BLOOD GLUCOSE) test strip [id number] Yes Dx DM E11.65. Check fasting blood sugar every morning and 2 hours after large meals daily. Check 3 times daily [name], MD Taking Active   insulin degludec (TRESIBA FLEXTOUCH) 100 UNIT/ML FlexTouch Pen [id number] Yes Inject 50-70 Units into the skin at bedtime.  Patient taking differently: Inject 50-70 Units into the skin at bedtime. Taking 45 units   [name], MD Taking Active            Med Note Rikki Spearing Jun 26, 2021 10:31 AM) Taking 40 units daily as of 06/26/21  losartan (COZAAR) 25 MG tablet [id number] Yes  TAKE 1/2 TABLET BY MOUTH EVERY DAY [name], MD Taking Active   metFORMIN (GLUCOPHAGE-XR) 500 MG 24 hr tablet [id number] Yes TAKE 2 TABLETS BY MOUTH EVERY DAY WITH BREAKFAST [name], MD Taking Active   pantoprazole (PROTONIX) 40 MG tablet [id number] Yes TAKE 1 TABLET BY MOUTH EVERY DAY [name], MD Taking Active   Probiotic Product (PROBIOTIC PO) [id number] Yes Take by mouth. [provider] Taking Active   Semaglutide (RYBELSUS) 3 MG TABS [id number] Yes Take by mouth. [provider] Taking Active   vitamin E 100 UNIT capsule [id number] Yes Take 200 Units by mouth daily. [provider] Taking Active  Patient Active Problem List   Diagnosis Date Noted   Gastroesophageal reflux disease without esophagitis 09/19/2020   Severe obesity (BMI 35.0-39.9) with comorbidity ([place]) 05/25/2019   Fatty liver 12/25/2017   BP (high blood pressure) 03/28/2015   Dyslipidemia 03/28/2015   Insomnia 03/28/2015   Restless leg 03/28/2015   Mass of arm 03/14/2015   Diabetes mellitus type 2, controlled ([place]) 03/14/2015   Obstructive apnea 09/03/2012   HYPERKERATOSIS 04/01/2009    Immunization History  Administered Date(s) Administered   Moderna Sars-Covid-2 Vaccination 01/06/2020, 02/12/2020   Pneumococcal Conjugate-13 12/25/2017   Tdap 03/26/2007, 06/29/2015    Conditions to be addressed/monitored: HTN, HLD, and DMII  Care Plan : Medication Management  Updates made by Darius Bump, [place] since 07/25/2021 12:00 AM     Problem: HTN, DM, HLD   Priority: High     Long-Range Goal: Disease Progression Prevention   Recent Progress: On track  Priority: High  Note:   Current Barriers:  Unable to achieve control of diabetes   Pharmacist Clinical Goal(s):  Over the next 14 days, patient will achieve control of diabetes as evidenced by blood glucose values and A1c through collaboration with PharmD and provider.    Interventions: 1:1 collaboration with [name], MD regarding development and update of comprehensive plan of care as evidenced by provider attestation and co-signature Inter-disciplinary care team collaboration (see longitudinal plan of care) Comprehensive medication review performed; medication list updated in electronic medical record  Diabetes:  Uncontrolled; tresiba 40 units daily, increasing by 2units every other day for a goal AM fasting BG ~160, restarted farxiga 04/05/21, began rybelsus 13m on 07/13/21.  Current glucose readings: via CGM  Date of Download: 07/24/21 % Time CGM is active: 85% Average Glucose: 173 mg/dL Glucose Management Indicator: 7.4  Glucose Variability: 16.9 (goal <36%) Time in Goal:  - Time in range 70-180: 65% - Time above range: 35% - Time below range: 0% Observed patterns: still with elevations 8-10pm, after patient gets off of a long day at work she states she eats late. We are working on this.  Date of Download: 07/12/21 % Time CGM is active: 84% Average Glucose: 197 mg/dL Glucose Management Indicator: 8.0%  Glucose Variability: 24.6 (goal <36%) Time in Goal:  - Time in range 70-180: 45% - Time above range: 55% - Time below range: 0% Observed patterns: Still with highest spikes ~9pm, glucose hits 270-300s.    Denies hyper/hypoglycemic symptoms  Previously discussed current meal patterns:  breakfast: oatmeal, eggs, "stays away from white bread";  lunch: ham & cheese 1/2 sandwhich (w/whole wheat), salad, apple;  dinner: pasta, potatoes, attempts meat/vegetables as much as possible;  snacks: crackers, cheese, sneaks cookie, husband brings sweets into house & patient struggles, describes self as "sugar-holic" ;   Still not at goal based upon CGM data. Patient is a candidate for GLP1 in order to optimize glucose control as well as assisting with her personal goal of weight loss. Patient also expressed cost constraint with tresiba &  farxiga. Patient IS eligible for tresiba & rybelsus patient assistance, but not farxiga. Initiated rybelsus 371mdaily on empty stomach, via sample. Patient states 2-3 days of nausea but not significantly miserable enough that she wants to stop the medicine. At last visit was provided [employer] patient assistance form - patient is working on the forms and will return when she can obtain husband's SS income statement.    Hypertension:  Controlled; losartan 2563maily;   Current home readings: not currently checking  Denies hypotensive/hypertensive symptoms  Recommended continue current regimen  Hyperlipidemia:  Uncontrolled; atorvastatin 52m;   Assessed medication + current labs, consider increasing atorvastatin dose in future visits  Patient Goals/Self-Care Activities Over the next 30 days, patient will:  Take medications as prescribed and scan CGM 3x per day, for data to be discussed at future appointments   Follow Up Plan: Telephone follow up appointment with care management team member scheduled for: 1 month     Medication Assistance: Application for rybelsus via [employer]  medication assistance program. in process.  Anticipated assistance start date TBD.  See plan of care for additional detail.  Patient's preferred pharmacy is:  [id number] [place] [id number]: [phone]: [phone] [id number]: [phone]: [phone] pill box? Yes Pt endorses 100% compliance  Follow Up:  Patient agrees to Care Plan and Follow-up.  Plan: Telephone follow up appointment with care management team member scheduled for:  1 month  KeDarius Bump

## 2021-07-25 NOTE — Patient Instructions (Signed)
Visit Information  PATIENT GOALS:  Goals Addressed             This Visit's Progress    Medication Management       Patient Goals/Self-Care Activities Over the next 30 days, patient will:  Take medications as prescribed and scan CGM 3x per day, for data to be discussed at future appointments   Follow Up Plan: Telephone follow up appointment with care management team member scheduled for: 1 month        Patient verbalizes understanding of instructions provided today and agrees to view in [place].   Telephone follow up appointment with care management team member scheduled for: 1 month  Darius Bump

## 2021-07-31 ENCOUNTER — Telehealth: Payer: Self-pay | Admitting: Family Medicine

## 2021-07-31 NOTE — Telephone Encounter (Signed)
Patient dropped off paperwork in regards to medication for [facility]. Placed in [place] folder. AM

## 2021-08-07 ENCOUNTER — Other Ambulatory Visit: Payer: Self-pay

## 2021-08-07 ENCOUNTER — Encounter: Payer: Self-pay | Admitting: Family Medicine

## 2021-08-07 ENCOUNTER — Ambulatory Visit (INDEPENDENT_AMBULATORY_CARE_PROVIDER_SITE_OTHER): Payer: Medicare HMO | Admitting: Family Medicine

## 2021-08-07 VITALS — BP 136/61 | HR 78 | Ht 62.0 in | Wt 199.0 lb

## 2021-08-07 DIAGNOSIS — E118 Type 2 diabetes mellitus with unspecified complications: Secondary | ICD-10-CM

## 2021-08-07 DIAGNOSIS — R9431 Abnormal electrocardiogram [ECG] [EKG]: Secondary | ICD-10-CM | POA: Diagnosis not present

## 2021-08-07 DIAGNOSIS — R0602 Shortness of breath: Secondary | ICD-10-CM

## 2021-08-07 DIAGNOSIS — I447 Left bundle-branch block, unspecified: Secondary | ICD-10-CM

## 2021-08-07 DIAGNOSIS — I739 Peripheral vascular disease, unspecified: Secondary | ICD-10-CM | POA: Diagnosis not present

## 2021-08-07 LAB — POCT GLYCOSYLATED HEMOGLOBIN (HGB A1C): Hemoglobin A1C: 8 % — AB (ref 4.0–5.6)

## 2021-08-07 NOTE — Assessment & Plan Note (Signed)
A1c looks much better today at 8.0 down from 9.1 which is great.  She has been on the Rybelsus for about a month I am hoping that with continued use it will continue to improve she is tolerated it well overall she had a couple of days where she felt a little nauseated.  She says having a continuous glucose meter has been really helpful in helping her focus and on some of her diet.  She says unfortunately the Wilder Glade has gone up in price since she had her Medicare gap.  We will try to see if we can help her with patient assistance with that as well.  Continue to work on increasing activity level trying to encourage her to walk for 5 to 10 minutes a day.

## 2021-08-07 NOTE — Progress Notes (Signed)
Established Patient Office Visit  Subjective:  Patient ID: Jillian Huynh, female    DOB: 14-Mar-1946  Age: 75 y.o. MRN: [id number]  CC:  Chief Complaint  Patient presents with   Diabetes    HPI Jillian Huynh presents for   Diabetes - no hypoglycemic events. No wounds or sores that are not healing well. No increased thirst or urination. Checking glucose at home. Taking medications as prescribed without any side effects. Now has the freestyle [place] and doing well.  She is doing well on the her Rybelsus. She brought in the paperwork today.  She says the price on the Wilder Glade has gone up.  Last a1C is 9.1.  Using 35 units of Tresiba.  She says she has had some days where her blood sugar looked great so she just skipped her insulin she says it starting to burn when she gets it in her abdomen.  She also let me know that she went on a cruise since I last saw her and just had a really difficult time walking she really could not keep up with her family because she says her legs felt "heavy" and she also felt short of breath with walking.  She did not experience any chest pain  Past Medical History:  Diagnosis Date   Diabetes mellitus without complication ([place])    Hyperlipidemia    Hypertension     Past Surgical History:  Procedure Laterality Date   CHOLECYSTECTOMY      Family History  Problem Relation Age of Onset   Diabetes Father    Cancer Father 68       Esophogeal Ca., Lung.   Hypertension Other    Thyroid disease Other    Colon cancer Daughter 75   Glaucoma Mother    Hepatitis C Mother 67       Died from.   Glaucoma Sister    Glaucoma Sister    Glaucoma Maternal Aunt     Social History   Socioeconomic History   Marital status: Married    Spouse name: Biochemist, clinical   Number of children: 3   Years of education: 16.5   Highest education level: Some college, no degree  Occupational History   Occupation: Hair Stylist  Tobacco Use   Smoking status: Never   Smokeless  tobacco: Never  Substance and Sexual Activity   Alcohol use: No    Alcohol/week: 0.0 standard drinks   Drug use: No   Sexual activity: Yes    Partners: Male  Other Topics Concern   Not on file  Social History Narrative   Lives with her husband. She is working full time as a Probation officer. Two of her children live close by. One is in [place]. One of her daughter's is deceased. Her hobbies include watch tv, listening to music and reading.    Social Determinants of Health   Financial Resource Strain: Low Risk    Difficulty of Paying Living Expenses: Not hard at all  Food Insecurity: No Food Insecurity   Worried About Charity fundraiser in the Last Year: Never true   [place] in the Last Year: Never true  Transportation Needs: No Transportation Needs   Lack of Transportation (Medical): No   Lack of Transportation (Non-Medical): No  Physical Activity: Inactive   Days of Exercise per Week: 0 days   Minutes of Exercise per Session: 0 min  Stress: No Stress Concern Present   Feeling of Stress : Not at all  Social Connections: Moderately Integrated   Frequency of Communication with Friends and Family: More than three times a week   Frequency of Social Gatherings with Friends and Family: More than three times a week   Attends Religious Services: Never   Marine scientist or Organizations: Yes   Attends Music therapist: 1 to 4 times per year   Marital Status: Married  Human resources officer Violence: Not At Risk   Fear of Current or Ex-Partner: No   Emotionally Abused: No   Physically Abused: No   Sexually Abused: No    Outpatient Medications Prior to Visit  Medication Sig Dispense Refill   atorvastatin (LIPITOR) 10 MG tablet TAKE 1 TABLET BY MOUTH EVERYDAY AT BEDTIME 90 tablet 3   B-D ULTRAFINE III SHORT PEN 31G X 8 MM MISC SMARTSIG:1 Each SUB-Q Daily     Calcium Carbonate-Vit D-Min (CALCIUM 1200 PO) Take by mouth.     Continuous Blood Gluc Sensor (FREESTYLE  LIBRE 2 SENSOR) MISC Sensors to be changed every 14 days. DX E11.8 2 each 5   FARXIGA 10 MG TABS tablet TAKE 1 TABLET BY MOUTH DAILY BEFORE BREAKFAST. 30 tablet 5   glucose blood (ON CALL PLUS BLOOD GLUCOSE) test strip Dx DM E11.65. Check fasting blood sugar every morning and 2 hours after large meals daily. Check 3 times daily 300 each prn   insulin degludec (TRESIBA FLEXTOUCH) 100 UNIT/ML FlexTouch Pen Inject 50-70 Units into the skin at bedtime. (Patient taking differently: Inject 50-70 Units into the skin at bedtime. Taking 45 units) 30 mL 1   losartan (COZAAR) 25 MG tablet TAKE 1/2 TABLET BY MOUTH EVERY DAY 45 tablet 3   metFORMIN (GLUCOPHAGE-XR) 500 MG 24 hr tablet TAKE 2 TABLETS BY MOUTH EVERY DAY WITH BREAKFAST 180 tablet 1   pantoprazole (PROTONIX) 40 MG tablet TAKE 1 TABLET BY MOUTH EVERY DAY 90 tablet 1   Probiotic Product (PROBIOTIC PO) Take by mouth.     Semaglutide (RYBELSUS) 3 MG TABS Take by mouth.     vitamin E 100 UNIT capsule Take 200 Units by mouth daily.     No facility-administered medications prior to visit.    Allergies  Allergen Reactions   Bydureon [Exenatide] Nausea And Vomiting   Glipizide Other (See Comments)    Leg cramps, fatigue, and weight gain   Trulicity [Dulaglutide] Diarrhea   Invokana [Canagliflozin] Other (See Comments)    Yeast infection   Victoza [Liraglutide] Nausea Only and Other (See Comments)    Constipation,belching,bloating    ROS Review of Systems    Objective:    Physical Exam Constitutional:      Appearance: Normal appearance. She is well-developed.  HENT:     Head: Normocephalic and atraumatic.  Cardiovascular:     Rate and Rhythm: Normal rate and regular rhythm.     Heart sounds: Normal heart sounds.  Pulmonary:     Effort: Pulmonary effort is normal.     Breath sounds: Normal breath sounds.  Skin:    General: Skin is warm and dry.  Neurological:     Mental Status: She is alert and oriented to person, place, and time.   Psychiatric:        Behavior: Behavior normal.    BP 136/61   Pulse 78   Ht 5\' 2"  (1.575 m)   Wt 199 lb (90.3 kg)   SpO2 97%   BMI 36.40 kg/m  Wt Readings from Last 3 Encounters:  08/07/21 199 lb (90.3 kg)  05/08/21 198 lb (89.8 kg)  01/30/21 195 lb (88.5 kg)     Health Maintenance Due  Topic Date Due   COVID-19 Vaccine (3 - Booster for Moderna series) 07/14/2020    There are no preventive care reminders to display for this patient.  Lab Results  Component Value Date   TSH 1.99 07/09/2018   Lab Results  Component Value Date   WBC 6.7 12/26/2020   HGB 12.7 12/26/2020   HCT 37.8 12/26/2020   MCV 83.3 12/26/2020   PLT 254 12/26/2020   Lab Results  Component Value Date   NA 139 12/26/2020   K 4.1 12/26/2020   CO2 26 12/26/2020   GLUCOSE 190 (H) 12/26/2020   BUN 13 12/26/2020   CREATININE 0.59 (L) 12/26/2020   BILITOT 1.2 12/26/2020   ALKPHOS 78 09/19/2016   AST 22 12/26/2020   ALT 34 (H) 12/26/2020   PROT 6.6 12/26/2020   ALBUMIN 4.3 09/19/2016   CALCIUM 10.0 12/26/2020   Lab Results  Component Value Date   CHOL 174 06/20/2020   Lab Results  Component Value Date   HDL 43 (L) 06/20/2020   Lab Results  Component Value Date   LDLCALC 81 06/20/2020   Lab Results  Component Value Date   TRIG 376 (H) 06/20/2020   Lab Results  Component Value Date   CHOLHDL 4.0 06/20/2020   Lab Results  Component Value Date   HGBA1C 8.0 (A) 08/07/2021      Assessment & Plan:   Problem List Items Addressed This Visit       Endocrine   Diabetes mellitus type 2, controlled ([place]) - Primary    A1c looks much better today at 8.0 down from 9.1 which is great.  She has been on the Rybelsus for about a month I am hoping that with continued use it will continue to improve she is tolerated it well overall she had a couple of days where she felt a little nauseated.  She says having a continuous glucose meter has been really helpful in helping her focus and on some of  her diet.  She says unfortunately the Wilder Glade has gone up in price since she had her Medicare gap.  We will try to see if we can help her with patient assistance with that as well.  Continue to work on increasing activity level trying to encourage her to walk for 5 to 10 minutes a day.      Relevant Orders   POCT glycosylated hemoglobin (Hb A1C) (Completed)   Ambulatory referral to Cardiology     Other   Left bundle branch block (LBBB) determined by electrocardiography   Relevant Orders   Ambulatory referral to Cardiology   Other Visit Diagnoses     Claudication [facility])       Relevant Orders   VAS [place] ABI WITH/WO TBI   Ambulatory referral to Cardiology   SOB (shortness of breath)       Relevant Orders   EKG 12-Lead   Ambulatory referral to Cardiology   Abnormal EKG       Relevant Orders   Ambulatory referral to Cardiology       Claudication-she does have heaviness in her legs with activity will evaluate for peripheral vascular disease by ordering ABIs.  We will call with results once available also we will do an EKG today and consider pulmonary work-up as well for her shortness of breath.  Shortness of breath-consider new onset COPD, could be cardiac causes well.  Decided to go ahead and get an EKG today for further work-up.  No prior EKG on file here in the office for comparison.  But EKG today shows rate of 66 bpm, normal sinus rhythm no acute ST-T wave changes but she does have some left bundle branch block.  Left bundle branch block-not previously known note old EKGs on file here or from what I can tell at [place].  Would like to refer her to cardiology for further work-up.  He is not currently having chest pain but is having shortness of breath with activity.  No orders of the defined types were placed in this encounter.   Follow-up: Return in about 3 months (around 11/06/2021) for Diabetes follow-up.   I spent 42 minutes on the day of the encounter to include  pre-visit record review, face-to-face time with the patient and post visit ordering of test.   Beatrice Lecher, MD

## 2021-08-08 ENCOUNTER — Telehealth: Payer: Self-pay | Admitting: Family Medicine

## 2021-08-08 DIAGNOSIS — I447 Left bundle-branch block, unspecified: Secondary | ICD-10-CM | POA: Insufficient documentation

## 2021-08-08 NOTE — Telephone Encounter (Signed)
Pt informed of EKG results and referral to cards.  Pt expressed understanding and is agreeable to see cards.  Advised pt that if she has not heard from either our office or cards office by the end of the week to please give [place] a call so that we can f/u on this referral.  Charyl Bigger, CMA

## 2021-08-08 NOTE — Telephone Encounter (Signed)
Please call patient and let her know that she did have what is called left bundle branch block on her EKG.  I try to look through to see if I could find an old EKG over at [facility] but could not find one for comparison I know she said she was not sure she never actually had an EKG before.  Because this is new and because she is having some shortness of breath with activities I would like to refer her to cardiology for further work-up its not an emergency but I do think it needs to be addressed further.  We will go ahead and place referral.

## 2021-08-11 DIAGNOSIS — E118 Type 2 diabetes mellitus with unspecified complications: Secondary | ICD-10-CM

## 2021-08-11 DIAGNOSIS — E785 Hyperlipidemia, unspecified: Secondary | ICD-10-CM

## 2021-08-11 DIAGNOSIS — I10 Essential (primary) hypertension: Secondary | ICD-10-CM

## 2021-08-21 ENCOUNTER — Other Ambulatory Visit: Payer: Self-pay

## 2021-08-21 ENCOUNTER — Ambulatory Visit (INDEPENDENT_AMBULATORY_CARE_PROVIDER_SITE_OTHER): Payer: Medicare HMO | Admitting: Pharmacist

## 2021-08-21 DIAGNOSIS — I10 Essential (primary) hypertension: Secondary | ICD-10-CM

## 2021-08-21 DIAGNOSIS — E785 Hyperlipidemia, unspecified: Secondary | ICD-10-CM

## 2021-08-21 DIAGNOSIS — E118 Type 2 diabetes mellitus with unspecified complications: Secondary | ICD-10-CM

## 2021-08-21 NOTE — Progress Notes (Signed)
Chronic Care Management Pharmacy Note  08/21/2021 Name:  Jillian Huynh MRN:  [id number] DOB:  08-Aug-1946  Summary: addressed HTN, HLD, and primarily DM. Doing well on new rybelsus 69m daily. Still awaiting cost assistance approval.  Recommendations/Changes made from today's visit: Making progress toward goals continue current regimen. Patient provided novon[employer] forms, & we submitted to company, patient received "incomplete/missing info" notice in the mail. Pharmacist will follow up with company.  Plan: f/u with pharmacist in 2 weeks  Subjective: Jillian Huynh an 75y.o. year old female who is a primary patient of Metheney, CRene Kocher MD.  The CCM team was consulted for assistance with disease management and care coordination needs.    Engaged with patient by telephone for follow up visit in response to provider referral for pharmacy case management and/or care coordination services.   Consent to Services:  The patient was given information about Chronic Care Management services, agreed to services, and gave verbal consent prior to initiation of services.  Please see initial visit note for detailed documentation.   Patient Care Team: MHali Marry MD as PCP - General (Family Medicine) KDarius Bump [facility] (Pharmacist)   Objective:  Lab Results  Component Value Date   CREATININE 0.59 (L) 12/26/2020   CREATININE 0.56 (L) 06/20/2020   CREATININE 0.59 (L) 08/25/2019    Lab Results  Component Value Date   HGBA1C 8.0 (A) 08/07/2021   Last diabetic Eye exam:  Lab Results  Component Value Date/Time   HMDIABEYEEXA No Retinopathy 06/26/2017 12:00 AM        Component Value Date/Time   CHOL 174 06/20/2020 1040   TRIG 376 (H) 06/20/2020 1040   HDL 43 (L) 06/20/2020 1040   CHOLHDL 4.0 06/20/2020 1040   VLDL 57 (H) 09/19/2016 1057   LDLCALC 81 06/20/2020 1040    Hepatic Function Latest Ref Rng & Units 12/26/2020 06/20/2020 08/25/2019  Total Protein 6.1  - 8.1 g/dL 6.6 6.7 6.7  Albumin 3.6 - 5.1 g/dL - - -  AST 10 - 35 U/L _0 ALT 6 - 29 U/L 34(H) 17 18  Alk Phosphatase 33 - 130 U/L - - -  Total Bilirubin 0.2 - 1.2 mg/dL 1.2 0.7 1.1    Lab Results  Component Value Date/Time   TSH 1.99 07/09/2018 03:43 PM   TSH 2.672 09/28/2015 10:42 AM    CBC Latest Ref Rng & Units 12/26/2020 07/09/2018 03/24/2015  WBC 3.8 - 10.8 Thousand/uL 6.7 8.1 7.1  Hemoglobin 11.7 - 15.5 g/dL 12.7 12.3 12.5  Hematocrit 35.0 - 45.0 % 37.8 36.8 37.4  Platelets 140 - 400 Thousand/uL 254 285 269    Lab Results  Component Value Date/Time   VD25OH 26 (L) 03/24/2015 09:08 AM    Clinical ASCVD: Yes  The 10-year ASCVD risk score (Arnett DK, et al., 2019) is: 36.6%   Values used to calculate the score:     Age: [age]years     Sex: Female     Is Non-Hispanic African American: No     Diabetic: Yes     Tobacco smoker: No     Systolic Blood Pressure: 1355mmHg     Is BP treated: Yes     HDL Cholesterol: 43 mg/dL     Total Cholesterol: 174 mg/dL     Social History   Tobacco Use  Smoking Status Never  Smokeless Tobacco Never   BP Readings from Last 3 Encounters:  08/07/21 136/61  05/08/21 (Marland Kitchen  132/52  01/30/21 132/80   Pulse Readings from Last 3 Encounters:  08/07/21 78  05/08/21 72  01/30/21 71   Wt Readings from Last 3 Encounters:  08/07/21 199 lb (90.3 kg)  05/08/21 198 lb (89.8 kg)  01/30/21 195 lb (88.5 kg)    Assessment: Review of patient past medical history, allergies, medications, health status, including review of consultants reports, laboratory and other test data, was performed as part of comprehensive evaluation and provision of chronic care management services.   SDOH:  (Social Determinants of Health) assessments and interventions performed:    CCM Care Plan  Allergies  Allergen Reactions   Bydureon [Exenatide] Nausea And Vomiting   Glipizide Other (See Comments)    Leg cramps, fatigue, and weight gain   Trulicity  [Dulaglutide] Diarrhea   Invokana [Canagliflozin] Other (See Comments)    Yeast infection   Victoza [Liraglutide] Nausea Only and Other (See Comments)    Constipation,belching,bloating    Medications Reviewed Today     Reviewed by [name], MD (Physician) on 08/07/21 at 1420  Med List Status: <None>   Medication Order Taking? Sig Documenting Provider Last Dose Status Informant  atorvastatin (LIPITOR) 10 MG tablet [id number] No TAKE 1 TABLET BY MOUTH EVERYDAY AT BEDTIME [name], MD Taking Active   B-D ULTRAFINE III SHORT PEN 31G X 8 MM MISC [id number] No SMARTSIG:1 Each SUB-Q Daily [provider] Taking Active   Calcium Carbonate-Vit D-Min (CALCIUM 1200 PO) [id number] No Take by mouth. [provider] Taking Active   Continuous Blood Gluc Sensor (FREESTYLE LIBRE 2 SENSOR) MISC [id number] No Sensors to be changed every 14 days. DX E11.8 [name], MD Taking Active   FARXIGA 10 MG TABS tablet [id number] No TAKE 1 TABLET BY MOUTH DAILY BEFORE BREAKFAST. [name], MD Taking Active   glucose blood (ON CALL PLUS BLOOD GLUCOSE) test strip [id number] No Dx DM E11.65. Check fasting blood sugar every morning and 2 hours after large meals daily. Check 3 times daily [name], MD Taking Active   insulin degludec (TRESIBA FLEXTOUCH) 100 UNIT/ML FlexTouch Pen [id number] No Inject 50-70 Units into the skin at bedtime.  Patient taking differently: Inject 50-70 Units into the skin at bedtime. Taking 45 units   [name], MD Taking Active            Med Note Rikki Spearing Jun 26, 2021 10:31 AM) Taking 40 units daily as of 06/26/21  losartan (COZAAR) 25 MG tablet [id number] No TAKE 1/2 TABLET BY MOUTH EVERY DAY [name], MD Taking Active   metFORMIN (GLUCOPHAGE-XR) 500 MG 24 hr tablet [id number] No TAKE 2 TABLETS BY MOUTH EVERY DAY WITH BREAKFAST [name], MD Taking Active   pantoprazole  (PROTONIX) 40 MG tablet [id number] No TAKE 1 TABLET BY MOUTH EVERY DAY [name], MD Taking Active   Probiotic Product (PROBIOTIC PO) [id number] No Take by mouth. [provider] Taking Active   Semaglutide (RYBELSUS) 3 MG TABS [id number] No Take by mouth. [provider] Taking Active   vitamin E 100 UNIT capsule [id number] No Take 200 Units by mouth daily. [provider] Taking Active             Patient Active Problem List   Diagnosis Date Noted   Left bundle branch block (LBBB) determined by electrocardiography 08/08/2021   Gastroesophageal reflux disease without esophagitis 09/19/2020   Severe obesity (BMI 35.0-39.9) with comorbidity ([place]) 05/25/2019  Fatty liver 12/25/2017   BP (high blood pressure) 03/28/2015   Dyslipidemia 03/28/2015   Insomnia 03/28/2015   Restless leg 03/28/2015   Mass of arm 03/14/2015   Diabetes mellitus type 2, controlled ([place]) 03/14/2015   Obstructive apnea 09/03/2012   HYPERKERATOSIS 04/01/2009    Immunization History  Administered Date(s) Administered   Moderna Sars-Covid-2 Vaccination 01/06/2020, 02/12/2020   Pneumococcal Conjugate-13 12/25/2017   Tdap 03/26/2007, 06/29/2015    Conditions to be addressed/monitored: HTN, HLD, and DMII  There are no care plans that you recently modified to display for this patient.   Medication Assistance: Application for rybelsus via [employer]  medication assistance program. in process.  Anticipated assistance start date TBD.  See plan of care for additional detail.  Patient's preferred pharmacy is:  [id number] [place] [id number]: [phone]: [phone] [place] [address] [place]Phone: [phone]: [phone] Uses pill box? Yes Pt endorses 100% compliance  Follow Up:  Patient  agrees to Care Plan and Follow-up.  Plan: Telephone follow up appointment with care management team member scheduled for:  2 weeks  KLarinda Buttery PharmD Clinical Pharmacist [phone]

## 2021-08-21 NOTE — Patient Instructions (Signed)
Visit Information  PATIENT GOALS:  Goals Addressed             This Visit's Progress    Medication Management       Patient Goals/Self-Care Activities Over the next 30 days, patient will:  Take medications as prescribed and scan CGM 3x per day, for data to be discussed at future appointments   Follow Up Plan: Telephone follow up appointment with care management team member scheduled for: 2 weeks        Patient verbalizes understanding of instructions provided today and agrees to view in [place].   Telephone follow up appointment with care management team member scheduled for: 2 weeks  Darius Bump

## 2021-08-23 ENCOUNTER — Ambulatory Visit: Payer: Medicare HMO | Admitting: Pharmacist

## 2021-08-23 ENCOUNTER — Other Ambulatory Visit: Payer: Self-pay

## 2021-08-23 DIAGNOSIS — E118 Type 2 diabetes mellitus with unspecified complications: Secondary | ICD-10-CM

## 2021-08-23 DIAGNOSIS — E1169 Type 2 diabetes mellitus with other specified complication: Secondary | ICD-10-CM

## 2021-08-23 DIAGNOSIS — E785 Hyperlipidemia, unspecified: Secondary | ICD-10-CM

## 2021-08-23 DIAGNOSIS — I10 Essential (primary) hypertension: Secondary | ICD-10-CM

## 2021-08-23 LAB — HM DIABETES EYE EXAM

## 2021-08-23 NOTE — Progress Notes (Signed)
Chronic Care Management Pharmacy Note  08/23/2021 Name:  Jillian Huynh MRN:  [id number] DOB:  17-Jan-1946  Summary: addressed HTN, HLD, and primarily DM. Doing well on new rybelsus 53m daily. Still awaiting cost assistance approval - pharmacist coordinated with [employer] for update: company needs patient signature re: medicare part D enrollee.  Recommendations/Changes made from today's visit: Making progress toward goals continue current regimen. Will leave patient section back at front desk for patient to complete and sign - patient must sign/date, and also provide insurance card for [place] make a copy & include with application. Patient to come by 08/28/21 to complete.  Plan: f/u with pharmacist in 2 weeks  Subjective: Jillian Amstutzis an 75y.o. year old female who is a primary patient of Metheney, CRene Kocher MD.  The CCM team was consulted for assistance with disease management and care coordination needs.    Engaged with patient by telephone for follow up visit in response to provider referral for pharmacy case management and/or care coordination services.   Consent to Services:  The patient was given information about Chronic Care Management services, agreed to services, and gave verbal consent prior to initiation of services.  Please see initial visit note for detailed documentation.   Patient Care Team: MHali Marry MD as PCP - General (Family Medicine) KDarius Bump [facility] Pharmacist (Pharmacist)   Objective:  Lab Results  Component Value Date   CREATININE 0.59 (L) 12/26/2020   CREATININE 0.56 (L) 06/20/2020   CREATININE 0.59 (L) 08/25/2019    Lab Results  Component Value Date   HGBA1C 8.0 (A) 08/07/2021   Last diabetic Eye exam:  Lab Results  Component Value Date/Time   HMDIABEYEEXA No Retinopathy 06/26/2017 12:00 AM        Component Value Date/Time   CHOL 174 06/20/2020 1040   TRIG 376 (H) 06/20/2020 1040   HDL 43 (L) 06/20/2020 1040   CHOLHDL  4.0 06/20/2020 1040   VLDL 57 (H) 09/19/2016 1057   LDLCALC 81 06/20/2020 1040    Hepatic Function Latest Ref Rng & Units 12/26/2020 06/20/2020 08/25/2019  Total Protein 6.1 - 8.1 g/dL 6.6 6.7 6.7  Albumin 3.6 - 5.1 g/dL - - -  AST 10 - 35 U/L 22 17 15   ALT 6 - 29 U/L 34(H) 17 18  Alk Phosphatase 33 - 130 U/L - - -  Total Bilirubin 0.2 - 1.2 mg/dL 1.2 0.7 1.1    Lab Results  Component Value Date/Time   TSH 1.99 07/09/2018 03:43 PM   TSH 2.672 09/28/2015 10:42 AM    CBC Latest Ref Rng & Units 12/26/2020 07/09/2018 03/24/2015  WBC 3.8 - 10.8 Thousand/uL 6.7 8.1 7.1  Hemoglobin 11.7 - 15.5 g/dL 12.7 12.3 12.5  Hematocrit 35.0 - 45.0 % 37.8 36.8 37.4  Platelets 140 - 400 Thousand/uL 254 285 269    Lab Results  Component Value Date/Time   VD25OH 26 (L) 03/24/2015 09:08 AM    Clinical ASCVD: Yes  The 10-year ASCVD risk score (Arnett DK, et al., 2019) is: 36.6%   Values used to calculate the score:     Age: [age]years     Sex: Female     Is Non-Hispanic African American: No     Diabetic: Yes     Tobacco smoker: No     Systolic Blood Pressure: 1007mmHg     Is BP treated: Yes     HDL Cholesterol: 43 mg/dL     Total Cholesterol:  174 mg/dL     Social History   Tobacco Use  Smoking Status Never  Smokeless Tobacco Never   BP Readings from Last 3 Encounters:  08/07/21 136/61  05/08/21 (!) 132/52  01/30/21 132/80   Pulse Readings from Last 3 Encounters:  08/07/21 78  05/08/21 72  01/30/21 71   Wt Readings from Last 3 Encounters:  08/07/21 199 lb (90.3 kg)  05/08/21 198 lb (89.8 kg)  01/30/21 195 lb (88.5 kg)    Assessment: Review of patient past medical history, allergies, medications, health status, including review of consultants reports, laboratory and other test data, was performed as part of comprehensive evaluation and provision of chronic care management services.   SDOH:  (Social Determinants of Health) assessments and interventions performed:    CCM Care  Plan  Allergies  Allergen Reactions   Bydureon [Exenatide] Nausea And Vomiting   Glipizide Other (See Comments)    Leg cramps, fatigue, and weight gain   Trulicity [Dulaglutide] Diarrhea   Invokana [Canagliflozin] Other (See Comments)    Yeast infection   Victoza [Liraglutide] Nausea Only and Other (See Comments)    Constipation,belching,bloating    Medications Reviewed Today     Reviewed by Darius Bump, [facility] (Pharmacist) on 08/21/21 at 1618  Med List Status: <None>   Medication Order Taking? Sig Documenting Provider Last Dose Status Informant  atorvastatin (LIPITOR) 10 MG tablet [id number] No TAKE 1 TABLET BY MOUTH EVERYDAY AT BEDTIME [name], MD Taking Active   B-D ULTRAFINE III SHORT PEN 31G X 8 MM MISC [id number] No SMARTSIG:1 Each SUB-Q Daily [provider] Taking Active   Calcium Carbonate-Vit D-Min (CALCIUM 1200 PO) [id number] No Take by mouth. [provider] Taking Active   Continuous Blood Gluc Sensor (FREESTYLE LIBRE 2 SENSOR) MISC [id number] No Sensors to be changed every 14 days. DX E11.8 [name], MD Taking Active   FARXIGA 10 MG TABS tablet [id number] No TAKE 1 TABLET BY MOUTH DAILY BEFORE BREAKFAST. [name], MD Taking Active   glucose blood (ON CALL PLUS BLOOD GLUCOSE) test strip [id number] No Dx DM E11.65. Check fasting blood sugar every morning and 2 hours after large meals daily. Check 3 times daily [name], MD Taking Active   insulin degludec (TRESIBA FLEXTOUCH) 100 UNIT/ML FlexTouch Pen [id number] No Inject 50-70 Units into the skin at bedtime.  Patient taking differently: Inject 50-70 Units into the skin at bedtime. Taking 45 units   [name], MD Taking Active            Med Note Jillian Huynh Jun 26, 2021 10:31 AM) Taking 40 units daily as of 06/26/21  losartan (COZAAR) 25 MG tablet [id number] No TAKE 1/2 TABLET BY MOUTH EVERY DAY [name], MD Taking Active    metFORMIN (GLUCOPHAGE-XR) 500 MG 24 hr tablet [id number] No TAKE 2 TABLETS BY MOUTH EVERY DAY WITH BREAKFAST [name], MD Taking Active   pantoprazole (PROTONIX) 40 MG tablet [id number] No TAKE 1 TABLET BY MOUTH EVERY DAY [name], MD Taking Active   Probiotic Product (PROBIOTIC PO) [id number] No Take by mouth. [provider] Taking Active   Semaglutide (RYBELSUS) 3 MG TABS [id number] No Take by mouth. [provider] Taking Active   vitamin E 100 UNIT capsule [id number] No Take 200 Units by mouth daily. [provider] Taking Active             Patient Active Problem List  Diagnosis Date Noted   Left bundle branch block (LBBB) determined by electrocardiography 08/08/2021   Gastroesophageal reflux disease without esophagitis 09/19/2020   Severe obesity (BMI 35.0-39.9) with comorbidity ([place]) 05/25/2019   Fatty liver 12/25/2017   BP (high blood pressure) 03/28/2015   Dyslipidemia 03/28/2015   Insomnia 03/28/2015   Restless leg 03/28/2015   Mass of arm 03/14/2015   Diabetes mellitus type 2, controlled ([place]) 03/14/2015   Obstructive apnea 09/03/2012   HYPERKERATOSIS 04/01/2009    Immunization History  Administered Date(s) Administered   Moderna Sars-Covid-2 Vaccination 01/06/2020, 02/12/2020   Pneumococcal Conjugate-13 12/25/2017   Tdap 03/26/2007, 06/29/2015    Conditions to be addressed/monitored: HTN, HLD, and DMII  Care Plan : Medication Management  Updates made by Darius Bump, [place] since 08/23/2021 12:00 AM     Problem: HTN, DM, HLD   Priority: High     Long-Range Goal: Disease Progression Prevention   Recent Progress: On track  Priority: High  Note:   Current Barriers:  Unable to achieve control of diabetes   Pharmacist Clinical Goal(s):  Over the next 14 days, patient will achieve control of diabetes as evidenced by blood glucose values and A1c through collaboration with PharmD and provider.    Interventions: 1:1 collaboration with [name], MD regarding development and update of comprehensive plan of care as evidenced by provider attestation and co-signature Inter-disciplinary care team collaboration (see longitudinal plan of care) Comprehensive medication review performed; medication list updated in electronic medical record  Diabetes:  Uncontrolled; tresiba 35 units daily (self-lowered dosage due to goal of wanting to decrease insulin), farxiga 142m daily, began rybelsus 311mon 07/13/21.  Current glucose readings: via CGM  Date of Download: 08/21/21 % Time CGM is active: 87% Average Glucose: 169 mg/dL Glucose Management Indicator: 7.4%  Glucose Variability: 17.1% (goal <36%) Time in Goal:  - Time in range 70-180: 73% - Time above range: 27% - Time below range: 0% Observed patterns: highest BG still 8-9pm when patient gets home from work and eats a good bit, but has slowed down from previous discussion!  Date of Download: 07/24/21 % Time CGM is active: 85% Average Glucose: 173 mg/dL Glucose Management Indicator: 7.4  Glucose Variability: 16.9 (goal <36%) Time in Goal:  - Time in range 70-180: 65% - Time above range: 35% - Time below range: 0% Observed patterns: still with elevations 8-10pm, after patient gets off of a long day at work she states she eats late. We are working on this.  Date of Download: 07/12/21 % Time CGM is active: 84% Average Glucose: 197 mg/dL Glucose Management Indicator: 8.0%  Glucose Variability: 24.6 (goal <36%) Time in Goal:  - Time in range 70-180: 45% - Time above range: 55% - Time below range: 0% Observed patterns: Still with highest spikes ~9pm, glucose hits 270-300s.    Denies hyper/hypoglycemic symptoms  Previously discussed current meal patterns:  breakfast: oatmeal, eggs, "stays away from white bread";  lunch: ham & cheese 1/2 sandwhich (w/whole wheat), salad, apple;  dinner: pasta, potatoes, attempts  meat/vegetables as much as possible;  snacks: crackers, cheese, sneaks cookie, husband brings sweets into house & patient struggles, describes self as "sugar-holic" ;   Making progress toward goals. Patient also expressed cost constraint with tresiba & farxiga. Patient IS eligible for tresiba & rybelsus patient assistance, but not farxiga. Doing well on new rybelsus 42m38maily. Coordinated care with [employer] program, will leave patient section of form at front desk for patient to sign, and insurance  card also needs a copy included w/application.   Hypertension:  Controlled; losartan 77m daily;   Current home readings: not currently checking  Denies hypotensive/hypertensive symptoms  Recommended continue current regimen  Hyperlipidemia:  Uncontrolled; atorvastatin 163m   Assessed medication + current labs, consider increasing atorvastatin dose in future visits  Patient Goals/Self-Care Activities Over the next 14 days, patient will:  Take medications as prescribed and scan CGM 3x per day, for data to be discussed at future appointments   Follow Up Plan: Telephone follow up appointment with care management team member scheduled for: 2 weeks      Medication Assistance: Application for rybelsus via [employer]  medication assistance program. in process.  Anticipated assistance start date TBD.  See plan of care for additional detail.  [facility]  [id number] [place]: [phone]: [phone]: [phone]: [phone] pill box? Yes Pt endorses 100% compliance  Follow Up:  Patient agrees to Care Plan and Follow-up.  Plan: Telephone follow up appointment with care management team member scheduled for:  2 weeks  KeeLarinda ButteryPharmD Clinical Pharmacist [phone]

## 2021-08-23 NOTE — Patient Instructions (Signed)
Hi Jillian Huynh, This "visit" is simply documenting the care coordination I completed with the drug company and the phone call where I gave you an update - I will leave the forms at the front desk for you!   Jillian Huynh    Visit Information  PATIENT GOALS:  Goals Addressed             This Visit's Progress    Medication Management       Patient Goals/Self-Care Activities Over the next 30 days, patient will:  Take medications as prescribed and scan CGM 3x per day, for data to be discussed at future appointments   Follow Up Plan: Telephone follow up appointment with care management team member scheduled for: 2 weeks         Patient verbalizes understanding of instructions provided today and agrees to view in [place].   Telephone follow up appointment with care management team member scheduled for: 2 weeks  Jillian Huynh

## 2021-08-24 ENCOUNTER — Encounter: Payer: Self-pay | Admitting: Family Medicine

## 2021-08-28 ENCOUNTER — Telehealth: Payer: Self-pay | Admitting: Family Medicine

## 2021-08-28 NOTE — Telephone Encounter (Signed)
Patient came in and signed forms & I faxed it over with her insurance cards. Placed copy in your box, patient wanted you to look them over and make sure that they were signed correctly. AM

## 2021-08-28 NOTE — Telephone Encounter (Signed)
Excellent!! Will review forms 08/30/21 and update patient if there are any issues.  Thank you!!! Lysle Morales

## 2021-08-30 ENCOUNTER — Other Ambulatory Visit: Payer: Self-pay

## 2021-08-30 ENCOUNTER — Ambulatory Visit (HOSPITAL_BASED_OUTPATIENT_CLINIC_OR_DEPARTMENT_OTHER)
Admission: RE | Admit: 2021-08-30 | Discharge: 2021-08-30 | Disposition: A | Discharge status: Home / Self Care | Payer: Medicare HMO | Source: Ambulatory Visit | Attending: Family Medicine | Admitting: Family Medicine

## 2021-08-30 DIAGNOSIS — I739 Peripheral vascular disease, unspecified: Secondary | ICD-10-CM | POA: Insufficient documentation

## 2021-08-30 DIAGNOSIS — E119 Type 2 diabetes mellitus without complications: Secondary | ICD-10-CM | POA: Diagnosis not present

## 2021-08-31 NOTE — Progress Notes (Signed)
Call patient: Preliminary results of blood flow test looks good.  No sign of peripheral vascular disease which is really reassuring.  So I really love to have her work on more consistent and regular exercise.  Physical therapy might even be helpful if that something she would be open to.  Also just a reminder that you are due for your second pneumonia vaccine would like to get that updated next time you are here or you can make a nurse visit to get that done.  She is due for Prevnar 20.

## 2021-09-04 ENCOUNTER — Ambulatory Visit: Payer: Medicare HMO | Admitting: Pharmacist

## 2021-09-04 ENCOUNTER — Other Ambulatory Visit: Payer: Self-pay

## 2021-09-04 DIAGNOSIS — E1169 Type 2 diabetes mellitus with other specified complication: Secondary | ICD-10-CM

## 2021-09-04 DIAGNOSIS — E118 Type 2 diabetes mellitus with unspecified complications: Secondary | ICD-10-CM

## 2021-09-04 DIAGNOSIS — I10 Essential (primary) hypertension: Secondary | ICD-10-CM

## 2021-09-04 DIAGNOSIS — E785 Hyperlipidemia, unspecified: Secondary | ICD-10-CM

## 2021-09-04 NOTE — Progress Notes (Signed)
Chronic Care Management Pharmacy Note  09/04/2021 Name:  Jillian Huynh MRN:  [id number] DOB:  11-20-1945  Summary: addressed HTN, HLD, and primarily DM. Doing well on new rybelsus 5m daily. Patient approved via [employer] as of 09/04/21 for rybelsus and tresiba! Awaiting shipment.  Glucose values as follows: Date of Download: 09/04/21 % Time CGM is active: 81% Average Glucose: 166 mg/dL Glucose Management Indicator: 7.3%  Glucose Variability: 16.6% (goal <36%) Time in Goal:  - Time in range 70-180: 73% - Time above range: 27% - Time below range: 0% Observed patterns:  Recommendations/Changes made from today's visit: Making progress toward goals, continue current regimen. Patient to come for on-site visit this Wednesday [facility] [place] and may need one more rybelsus sample to last until medication shipment arrives from [employer].  Plan: f/u with pharmacist in 2 days  Subjective: [name] an 75y.o. year old female who is a primary patient of [place], CRene Kocher MD.  The CCM team was consulted for assistance with disease management and care coordination needs.    Engaged with patient by telephone for follow up visit in response to provider referral for pharmacy case management and/or care coordination services.   Consent to Services:  The patient was given information about Chronic Care Management services, agreed to services, and gave verbal consent prior to initiation of services.  Please see initial visit note for detailed documentation.   Patient Care Team: MHali Marry MD as PCP - General (Family Medicine) KDarius Bump [facility] (Pharmacist)   Objective:  Lab Results  Component Value Date   CREATININE 0.59 (L) 12/26/2020   CREATININE 0.56 (L) 06/20/2020   CREATININE 0.59 (L) 08/25/2019    Lab Results  Component Value Date   HGBA1C 8.0 (A) 08/07/2021   Last diabetic Eye exam:  Lab Results   Component Value Date/Time   HMDIABEYEEXA No Retinopathy 08/23/2021 12:00 AM        Component Value Date/Time   CHOL 174 06/20/2020 1040   TRIG 376 (H) 06/20/2020 1040   HDL 43 (L) 06/20/2020 1040   CHOLHDL 4.0 06/20/2020 1040   VLDL 57 (H) 09/19/2016 1057   LDLCALC 81 06/20/2020 1040    Hepatic Function Latest Ref Rng & Units 12/26/2020 06/20/2020 08/25/2019  Total Protein 6.1 - 8.1 g/dL 6.6 6.7 6.7  Albumin 3.6 - 5.1 g/dL - - -  AST 10 - 35 U/L 22 17 15   ALT 6 - 29 U/L 34(H) 17 18  Alk Phosphatase 33 - 130 U/L - - -  Total Bilirubin 0.2 - 1.2 mg/dL 1.2 0.7 1.1    Lab Results  Component Value Date/Time   TSH 1.99 07/09/2018 03:43 PM   TSH 2.672 09/28/2015 10:42 AM    CBC Latest Ref Rng & Units 12/26/2020 07/09/2018 03/24/2015  WBC 3.8 - 10.8 Thousand/uL 6.7 8.1 7.1  Hemoglobin 11.7 - 15.5 g/dL 12.7 12.3 12.5  Hematocrit 35.0 - 45.0 % 37.8 36.8 37.4  Platelets 140 - 400 Thousand/uL 254 285 269    Lab Results  Component Value Date/Time   VD25OH 26 (L) 03/24/2015 09:08 AM    Clinical ASCVD: Yes  The 10-year ASCVD risk score (Arnett DK, et al., 2019) is: 36.6%   Values used to calculate the score:     Age: 75years     Sex: Female     Is Non-Hispanic African American: No     Diabetic: Yes  Tobacco smoker: No     Systolic Blood Pressure: 962 mmHg     Is BP treated: Yes     HDL Cholesterol: 43 mg/dL     Total Cholesterol: 174 mg/dL     Social History   Tobacco Use  Smoking Status Never  Smokeless Tobacco Never   BP Readings from Last 3 Encounters:  08/07/21 136/61  05/08/21 (!) 132/52  01/30/21 132/80   Pulse Readings from Last 3 Encounters:  08/07/21 78  05/08/21 72  01/30/21 71   Wt Readings from Last 3 Encounters:  08/07/21 199 lb (90.3 kg)  05/08/21 198 lb (89.8 kg)  01/30/21 195 lb (88.5 kg)    Assessment: Review of patient past medical history, allergies, medications, health status, including review of consultants reports, laboratory and  other test data, was performed as part of comprehensive evaluation and provision of chronic care management services.   SDOH:  (Social Determinants of Health) assessments and interventions performed:    CCM Care Plan  Allergies  Allergen Reactions   Bydureon [Exenatide] Nausea And Vomiting   Glipizide Other (See Comments)    Leg cramps, fatigue, and weight gain   Trulicity [Dulaglutide] Diarrhea   Invokana [Canagliflozin] Other (See Comments)    Yeast infection   Victoza [Liraglutide] Nausea Only and Other (See Comments)    Constipation,belching,bloating    Medications Reviewed Today     Reviewed by Darius Bump, [facility] (Pharmacist) on 08/21/21 at 1618  Med List Status: <None>   Medication Order Taking? Sig Documenting Provider Last Dose Status Informant  atorvastatin (LIPITOR) 10 MG tablet [id number] No TAKE 1 TABLET BY MOUTH EVERYDAY AT BEDTIME [name], MD Taking Active   B-D ULTRAFINE III SHORT PEN 31G X 8 MM MISC [id number] No SMARTSIG:1 Each SUB-Q Daily [provider] Taking Active   Calcium Carbonate-Vit D-Min (CALCIUM 1200 PO) [id number] No Take by mouth. [provider] Taking Active   Continuous Blood Gluc Sensor (FREESTYLE LIBRE 2 SENSOR) MISC [id number] No Sensors to be changed every 14 days. DX E11.8 [name], MD Taking Active   FARXIGA 10 MG TABS tablet [id number] No TAKE 1 TABLET BY MOUTH DAILY BEFORE BREAKFAST. [name], MD Taking Active   glucose blood (ON CALL PLUS BLOOD GLUCOSE) test strip [id number] No Dx DM E11.65. Check fasting blood sugar every morning and 2 hours after large meals daily. Check 3 times daily [name], MD Taking Active   insulin degludec (TRESIBA FLEXTOUCH) 100 UNIT/ML FlexTouch Pen [id number] No Inject 50-70 Units into the skin at bedtime.  Patient taking differently: Inject 50-70 Units into the skin at bedtime. Taking 45 units   [name], MD Taking Active             Med Note Rikki Spearing Jun 26, 2021 10:31 AM) Taking 40 units daily as of 06/26/21  losartan (COZAAR) 25 MG tablet [id number] No TAKE 1/2 TABLET BY MOUTH EVERY DAY [name], MD Taking Active   metFORMIN (GLUCOPHAGE-XR) 500 MG 24 hr tablet [id number] No TAKE 2 TABLETS BY MOUTH EVERY DAY WITH BREAKFAST [name], MD Taking Active   pantoprazole (PROTONIX) 40 MG tablet [id number] No TAKE 1 TABLET BY MOUTH EVERY DAY [name], MD Taking Active   Probiotic Product (PROBIOTIC PO) [id number] No Take by mouth. [provider] Taking Active   Semaglutide (RYBELSUS) 3 MG TABS [id number] No Take by mouth. [provider] Taking Active  vitamin E 100 UNIT capsule [id number] No Take 200 Units by mouth daily. [provider] Taking Active             Patient Active Problem List   Diagnosis Date Noted   Left bundle branch block (LBBB) determined by electrocardiography 08/08/2021   Gastroesophageal reflux disease without esophagitis 09/19/2020   Severe obesity (BMI 35.0-39.9) with comorbidity ([place]) 05/25/2019   Fatty liver 12/25/2017   BP (high blood pressure) 03/28/2015   Dyslipidemia 03/28/2015   Insomnia 03/28/2015   Restless leg 03/28/2015   Mass of arm 03/14/2015   Diabetes mellitus type 2, controlled ([place]) 03/14/2015   Obstructive apnea 09/03/2012   HYPERKERATOSIS 04/01/2009    Immunization History  Administered Date(s) Administered   Moderna Sars-Covid-2 Vaccination 01/06/2020, 02/12/2020   Pneumococcal Conjugate-13 12/25/2017   Tdap 03/26/2007, 06/29/2015    Conditions to be addressed/monitored: HTN, HLD, and DMII  Care Plan : Medication Management  Updates made by Darius Bump, [place] since 09/04/2021 12:00 AM     Problem: HTN, DM, HLD   Priority: High     Long-Range Goal: Disease Progression Prevention   Recent Progress: On track  Priority: High  Note:   Current Barriers:  Unable to achieve control of  diabetes   Pharmacist Clinical Goal(s):  Over the next 90 days, patient will achieve control of diabetes as evidenced by blood glucose values and A1c through collaboration with PharmD and provider.   Interventions: 1:1 collaboration with [name], MD regarding development and update of comprehensive plan of care as evidenced by provider attestation and co-signature Inter-disciplinary care team collaboration (see longitudinal plan of care) Comprehensive medication review performed; medication list updated in electronic medical record  Diabetes:  Uncontrolled; tresiba 35 units daily (self-lowered dosage due to goal of wanting to decrease insulin), farxiga 69m daily, began rybelsus 357mon 07/13/21.  Current glucose readings: via CGM  Date of Download: 09/04/21 % Time CGM is active: 81% Average Glucose: 166 mg/dL Glucose Management Indicator: 7.3%  Glucose Variability: 16.6% (goal <36%) Time in Goal:  - Time in range 70-180: 73% - Time above range: 27% - Time below range: 0% Observed patterns: BG still highest 8-9pm, but still shows improvement  Date of Download: 08/21/21 % Time CGM is active: 87% Average Glucose: 169 mg/dL Glucose Management Indicator: 7.4%  Glucose Variability: 17.1% (goal <36%) Time in Goal:  - Time in range 70-180: 73% - Time above range: 27% - Time below range: 0% Observed patterns: highest BG still 8-9pm when patient gets home from work and eats a good bit, but has slowed down from previous discussion!  Date of Download: 07/24/21 % Time CGM is active: 85% Average Glucose: 173 mg/dL Glucose Management Indicator: 7.4  Glucose Variability: 16.9 (goal <36%) Time in Goal:  - Time in range 70-180: 65% - Time above range: 35% - Time below range: 0% Observed patterns: still with elevations 8-10pm, after patient gets off of a long day at work she states she eats late. We are working on this.  Date of Download: 07/12/21 % Time CGM is active:  84% Average Glucose: 197 mg/dL Glucose Management Indicator: 8.0%  Glucose Variability: 24.6 (goal <36%) Time in Goal:  - Time in range 70-180: 45% - Time above range: 55% - Time below range: 0% Observed patterns: Still with highest spikes ~9pm, glucose hits 270-300s.    Denies hyper/hypoglycemic symptoms  Previously discussed current meal patterns:  breakfast: oatmeal, eggs, "stays away from white bread";  lunch: ham &  cheese 1/2 sandwhich (w/whole wheat), salad, apple;  dinner: pasta, potatoes, attempts meat/vegetables as much as possible;  snacks: crackers, cheese, sneaks cookie, husband brings sweets into house & patient struggles, describes self as "sugar-holic" ;   Making progress toward goals. Patient also expressed cost constraint with tresiba & farxiga. Patient IS eligible for tresiba & rybelsus patient assistance, but not farxiga. Doing well on new rybelsus 46m daily. Patient was approved via [employer] for tresiba & rybelsus as of 09/04/21, awaiting medication shipment.   Hypertension:  Controlled; losartan 274mdaily;   Current home readings: not currently checking  Denies hypotensive/hypertensive symptoms  Recommended continue current regimen  Hyperlipidemia:  Uncontrolled; atorvastatin 1032m  Assessed medication + current labs, consider increasing atorvastatin dose in future visits  Patient Goals/Self-Care Activities Over the next 2 days, patient will:  Take medications as prescribed and scan CGM 3x per day, for data to be discussed at future appointments   Follow Up Plan: Face to face follow up appointment with care management team member scheduled for: 2 days       Medication Assistance: Application for rybelsus via [employer]  medication assistance program. in process.  Anticipated assistance start date TBD.  See plan of care for additional detail.  Patient's [facility] pharmacy is:  [id number] [place] [address]: [phone]: [phone] [place]: [phone]: [phone] pill box? Yes Pt endorses 100% compliance  Follow Up:  Patient agrees to Care Plan and Follow-up.  Plan: Face to Face appointment with care management team member scheduled for: 2 days  KeesLarinda ButteryarmD Clinical Pharmacist [facility]-[phone]

## 2021-09-04 NOTE — Patient Instructions (Signed)
Visit Information  PATIENT GOALS:  Goals Addressed             This Visit's Progress    Medication Management       Patient Goals/Self-Care Activities Over the next 2 days, patient will:  Take medications as prescribed and scan CGM 3x per day, for data to be discussed at future appointments   Follow Up Plan: Face to face follow up appointment with care management team member scheduled for: 2 days        Patient verbalizes understanding of instructions provided today and agrees to view in [place].   Face to Face appointment with care management team member scheduled for:  2 days Jillian Huynh

## 2021-09-06 ENCOUNTER — Ambulatory Visit: Payer: Medicare HMO | Admitting: Pharmacist

## 2021-09-06 DIAGNOSIS — E1169 Type 2 diabetes mellitus with other specified complication: Secondary | ICD-10-CM

## 2021-09-06 DIAGNOSIS — E785 Hyperlipidemia, unspecified: Secondary | ICD-10-CM

## 2021-09-06 DIAGNOSIS — I10 Essential (primary) hypertension: Secondary | ICD-10-CM

## 2021-09-06 DIAGNOSIS — E118 Type 2 diabetes mellitus with unspecified complications: Secondary | ICD-10-CM

## 2021-09-07 ENCOUNTER — Other Ambulatory Visit: Payer: Self-pay | Admitting: Family Medicine

## 2021-09-07 DIAGNOSIS — E118 Type 2 diabetes mellitus with unspecified complications: Secondary | ICD-10-CM

## 2021-09-08 NOTE — Patient Instructions (Signed)
Visit Information  PATIENT GOALS:  Goals Addressed             This Visit's Progress    Medication Management       Patient Goals/Self-Care Activities Over the next 30 days, patient will:  Take medications as prescribed and utilize CGM, for data to be discussed at future appointments   Follow Up Plan: Telephone follow up appointment with care management team member scheduled for:  1 month        Patient verbalizes understanding of instructions provided today and agrees to view in [place].   Telephone follow up appointment with care management team member scheduled for: 1 month  Darius Bump

## 2021-09-08 NOTE — Progress Notes (Signed)
Chronic Care Management Pharmacy Note  09/08/2021 Name:  Jillian Huynh MRN:  [id number] DOB:  Aug 25, 1946  Summary: addressed HTN, HLD, and primarily DM. Doing well on new rybelsus 70m daily.  Patient approved via novonordisk as of 09/04/21 for rybelsus and tresiba! Awaiting shipment. Provided one more sample box of rybelsus so she does not run out, and also switched over to new FColgate-Palmol[employer]3 (from [employer]2), provided sample in office.  Glucose values as follows: Date of Download: 09/04/21 % Time CGM is active: 81% Average Glucose: 166 mg/dL Glucose Management Indicator: 7.3%  Glucose Variability: 16.6% (goal <36%) Time in Goal:  - Time in range 70-180: 73% - Time above range: 27% - Time below range: 0% Observed patterns:  Recommendations/Changes made from today's visit: Making progress toward goals, continue current regimen.  - PCP to please send new RX for Freestyle Libre 3 to patient pharmacy  Plan: f/u with pharmacist in 1 month  Subjective: NDarice Vicariois an 75y.o. year old female who is a primary patient of Metheney, CRene Kocher MD.  The CCM team was consulted for assistance with disease management and care coordination needs.    Engaged with patient by telephone for follow up visit in response to provider referral for pharmacy case management and/or care coordination services.   Consent to Services:  The patient was given information about Chronic Care Management services, agreed to services, and gave verbal consent prior to initiation of services.  Please see initial visit note for detailed documentation.   Patient Care Team: MHali Marry MD as PCP - General (Family Medicine) [facility]ump [facility] (Pharmacist)   Objective:  Lab Results  Component Value Date   CREATININE 0.59 (L) 12/26/2020   CREATININE 0.56 (L) 06/20/2020   CREATININE 0.59 (L) 08/25/2019    Lab Results  Component Value Date   HGBA1C 8.0 (A) 08/07/2021   Last  diabetic Eye exam:  Lab Results  Component Value Date/Time   HMDIABEYEEXA No Retinopathy 08/23/2021 12:00 AM        Component Value Date/Time   CHOL 174 06/20/2020 1040   TRIG 376 (H) 06/20/2020 1040   HDL 43 (L) 06/20/2020 1040   CHOLHDL 4.0 06/20/2020 1040   VLDL 57 (H) 09/19/2016 1057   LDLCALC 81 06/20/2020 1040    Hepatic Function Latest Ref Rng & Units 12/26/2020 06/20/2020 08/25/2019  Total Protein 6.1 - 8.1 g/dL 6.6 6.7 6.7  Albumin 3.6 - 5.1 g/dL - - -  AST 10 - 35 U/L 22 17 15   ALT 6 - 29 U/L 34(H) 17 18  Alk Phosphatase 33 - 130 U/L - - -  Total Bilirubin 0.2 - 1.2 mg/dL 1.2 0.7 1.1    Lab Results  Component Value Date/Time   TSH 1.99 07/09/2018 03:43 PM   TSH 2.672 09/28/2015 10:42 AM    CBC Latest Ref Rng & Units 12/26/2020 07/09/2018 03/24/2015  WBC 3.8 - 10.8 Thousand/uL 6.7 8.1 7.1  Hemoglobin 11.7 - 15.5 g/dL 12.7 12.3 12.5  Hematocrit 35.0 - 45.0 % 37.8 36.8 37.4  Platelets 140 - 400 Thousand/uL 254 285 269    Lab Results  Component Value Date/Time   VD25OH 26 (L) 03/24/2015 09:08 AM    Clinical ASCVD: Yes  The 10-year ASCVD risk score (Arnett DK, et al., 2019) is: 36.6%   Values used to calculate the score:     Age: 75years     Sex: Female     Is  Non-Hispanic African American: No     Diabetic: Yes     Tobacco smoker: No     Systolic Blood Pressure: 115 mmHg     Is BP treated: Yes     HDL Cholesterol: 43 mg/dL     Total Cholesterol: 174 mg/dL     Social History   Tobacco Use  Smoking Status Never  Smokeless Tobacco Never   BP Readings from Last 3 Encounters:  08/07/21 136/61  05/08/21 (!) 132/52  01/30/21 132/80   Pulse Readings from Last 3 Encounters:  08/07/21 78  05/08/21 72  01/30/21 71   Wt Readings from Last 3 Encounters:  08/07/21 199 lb (90.3 kg)  05/08/21 198 lb (89.8 kg)  01/30/21 195 lb (88.5 kg)    Assessment: Review of patient past medical history, allergies, medications, health status, including review of  consultants reports, laboratory and other test data, was performed as part of comprehensive evaluation and provision of chronic care management services.   SDOH:  (Social Determinants of Health) assessments and interventions performed:    CCM Care Plan  Allergies  Allergen Reactions   Bydureon [Exenatide] Nausea And Vomiting   Glipizide Other (See Comments)    Leg cramps, fatigue, and weight gain   Trulicity [Dulaglutide] Diarrhea   Invokana [Canagliflozin] Other (See Comments)    Yeast infection   Victoza [Liraglutide] Nausea Only and Other (See Comments)    Constipation,belching,bloating    Medications Reviewed Today     Reviewed by Darius Bump, [facility] (Pharmacist) on 08/21/21 at 1618  Med List Status: <None>   Medication Order Taking? Sig Documenting Provider Last Dose Status Informant  atorvastatin (LIPITOR) 10 MG tablet [id number] No TAKE 1 TABLET BY MOUTH EVERYDAY AT BEDTIME [name], MD Taking Active   B-D ULTRAFINE III SHORT PEN 31G X 8 MM MISC [id number] No SMARTSIG:1 Each SUB-Q Daily [provider] Taking Active   Calcium Carbonate-Vit D-Min (CALCIUM 1200 PO) [id number] No Take by mouth. [provider] Taking Active   Continuous Blood Gluc Sensor (FREESTYLE LIBRE 2 SENSOR) MISC [id number] No Sensors to be changed every 14 days. DX E11.8 [name], MD Taking Active   FARXIGA 10 MG TABS tablet [id number] No TAKE 1 TABLET BY MOUTH DAILY BEFORE BREAKFAST. [name], MD Taking Active   glucose blood (ON CALL PLUS BLOOD GLUCOSE) test strip [id number] No Dx DM E11.65. Check fasting blood sugar every morning and 2 hours after large meals daily. Check 3 times daily [name], MD Taking Active   insulin degludec (TRESIBA FLEXTOUCH) 100 UNIT/ML FlexTouch Pen [id number] No Inject 50-70 Units into the skin at bedtime.  Patient taking differently: Inject 50-70 Units into the skin at bedtime. Taking 45 units   [name], MD Taking Active            Med Note Rikki Spearing Jun 26, 2021 10:31 AM) Taking 40 units daily as of 06/26/21  losartan (COZAAR) 25 MG tablet [id number] No TAKE 1/2 TABLET BY MOUTH EVERY DAY [name], MD Taking Active   metFORMIN (GLUCOPHAGE-XR) 500 MG 24 hr tablet [id number] No TAKE 2 TABLETS BY MOUTH EVERY DAY WITH BREAKFAST [name], MD Taking Active   pantoprazole (PROTONIX) 40 MG tablet [id number] No TAKE 1 TABLET BY MOUTH EVERY DAY [name], MD Taking Active   Probiotic Product (PROBIOTIC PO) [id number] No Take by mouth. [provider] Taking Active   Semaglutide [facility]) 3  MG TABS [id number] No Take by mouth. [provider] Taking Active   vitamin E 100 UNIT capsule [id number] No Take 200 Units by mouth daily. [provider] Taking Active             Patient Active Problem List   Diagnosis Date Noted   Left bundle branch block (LBBB) determined by electrocardiography 08/08/2021   Gastroesophageal reflux disease without esophagitis 09/19/2020   Severe obesity (BMI 35.0-39.9) with comorbidity ([place]) 05/25/2019   Fatty liver 12/25/2017   BP (high blood pressure) 03/28/2015   Dyslipidemia 03/28/2015   Insomnia 03/28/2015   Restless leg 03/28/2015   Mass of arm 03/14/2015   Diabetes mellitus type 2, controlled ([place]) 03/14/2015   Obstructive apnea 09/03/2012   HYPERKERATOSIS 04/01/2009    Immunization History  Administered Date(s) Administered   Moderna Sars-Covid-2 Vaccination 01/06/2020, 02/12/2020   Pneumococcal Conjugate-13 12/25/2017   Tdap 03/26/2007, 06/29/2015    Conditions to be addressed/monitored: HTN, HLD, and DMII  Care Plan : Medication Management  Updates made by Darius Bump, [place] since 09/08/2021 12:00 AM     Problem: HTN, DM, HLD   Priority: High     Long-Range Goal: Disease Progression Prevention   Recent Progress: On track  Priority: High  Note:   Current  Barriers:  Unable to achieve control of diabetes   Pharmacist Clinical Goal(s):  Over the next 30 days, patient will achieve control of diabetes as evidenced by blood glucose values and A1c through collaboration with PharmD and provider.   Interventions: 1:1 collaboration with [name], MD regarding development and update of comprehensive plan of care as evidenced by provider attestation and co-signature Inter-disciplinary care team collaboration (see longitudinal plan of care) Comprehensive medication review performed; medication list updated in electronic medical record  Diabetes:  Uncontrolled; tresiba 35 units daily (self-lowered dosage due to goal of wanting to decrease insulin), farxiga 61m daily, began rybelsus 318mon 07/13/21.  Current glucose readings: via CGM  Date of Download: 09/04/21 % Time CGM is active: 81% Average Glucose: 166 mg/dL Glucose Management Indicator: 7.3%  Glucose Variability: 16.6% (goal <36%) Time in Goal:  - Time in range 70-180: 73% - Time above range: 27% - Time below range: 0% Observed patterns: BG still highest 8-9pm, but still shows improvement  Date of Download: 08/21/21 % Time CGM is active: 87% Average Glucose: 169 mg/dL Glucose Management Indicator: 7.4%  Glucose Variability: 17.1% (goal <36%) Time in Goal:  - Time in range 70-180: 73% - Time above range: 27% - Time below range: 0% Observed patterns: highest BG still 8-9pm when patient gets home from work and eats a good bit, but has slowed down from previous discussion!  Date of Download: 07/24/21 % Time CGM is active: 85% Average Glucose: 173 mg/dL Glucose Management Indicator: 7.4  Glucose Variability: 16.9 (goal <36%) Time in Goal:  - Time in range 70-180: 65% - Time above range: 35% - Time below range: 0% Observed patterns: still with elevations 8-10pm, after patient gets off of a long day at work she states she eats late. We are working on this.  Date of  Download: 07/12/21 % Time CGM is active: 84% Average Glucose: 197 mg/dL Glucose Management Indicator: 8.0%  Glucose Variability: 24.6 (goal <36%) Time in Goal:  - Time in range 70-180: 45% - Time above range: 55% - Time below range: 0% Observed patterns: Still with highest spikes ~9pm, glucose hits 270-300s.    Denies hyper/hypoglycemic symptoms  Previously discussed current  meal patterns:  breakfast: oatmeal, eggs, "stays away from white bread";  lunch: ham & cheese 1/2 sandwhich (w/whole wheat), salad, apple;  dinner: pasta, potatoes, attempts meat/vegetables as much as possible;  snacks: crackers, cheese, sneaks cookie, husband brings sweets into house & patient struggles, describes self as "sugar-holic" ;   Making progress toward goals. Patient also expressed cost constraint with tresiba & farxiga. Patient IS eligible for tresiba & rybelsus patient assistance, but not farxiga. Doing well on new rybelsus 86m daily. Patient was approved via novonordisk for tresiba & rybelsus as of 09/04/21, awaiting medication shipment.   Hypertension:  Controlled; losartan 264mdaily;   Current home readings: not currently checking  Denies hypotensive/hypertensive symptoms  Recommended continue current regimen  Hyperlipidemia:  Uncontrolled; atorvastatin 1078m  Assessed medication + current labs, consider increasing atorvastatin dose in future visits  Patient Goals/Self-Care Activities Over the next 30 days, patient will:  Take medications as prescribed and utilize CGM, for data to be discussed at future appointments   Follow Up Plan: Telephone follow up appointment with care management team member scheduled for: 30 days        Medication Assistance: Application for rybelsus via Novonordisk  medication assistance program. in process.  Anticipated assistance start date TBD.  See plan of care for additional detail.  Patient's [facility] pharmacy is:  [id number] [place]  [id number]: [phone]: [phone] [id number]: [phone]: [phone] pill box? Yes Pt endorses 100% compliance  Follow Up:  Patient agrees to Care Plan and Follow-up.  Plan: Telephone follow up appointment with care management team member scheduled for:  1 month  KeesLarinda ButteryarmD Clinical Pharmacist [facility]-[phone]

## 2021-09-11 DIAGNOSIS — E1169 Type 2 diabetes mellitus with other specified complication: Secondary | ICD-10-CM | POA: Diagnosis not present

## 2021-09-11 DIAGNOSIS — E118 Type 2 diabetes mellitus with unspecified complications: Secondary | ICD-10-CM | POA: Diagnosis not present

## 2021-09-11 DIAGNOSIS — E785 Hyperlipidemia, unspecified: Secondary | ICD-10-CM | POA: Diagnosis not present

## 2021-09-11 DIAGNOSIS — I10 Essential (primary) hypertension: Secondary | ICD-10-CM | POA: Diagnosis not present

## 2021-09-11 NOTE — Progress Notes (Signed)
Referring-Jillian Metheney MD Reason for referral-dyspnea and abnormal electrocardiogram  HPI: 75 year old female for evaluation of dyspnea and abnormal electrocardiogram at request of Jillian Lecher MD.  ABIs October 2022 normal.  Recently patient has noticed increased fatigue in her legs and some dyspnea with activities that is unusual.  She does not have orthopnea, PND, pedal edema, palpitations or syncope.  No chest pain.  She was recently on a cruise and noticed that she could not keep up with her family members.  Cardiology now asked to evaluate.  Current Outpatient Medications  Medication Sig Dispense Refill   atorvastatin (LIPITOR) 10 MG tablet TAKE 1 TABLET BY MOUTH EVERYDAY AT BEDTIME 90 tablet 3   B-D ULTRAFINE III SHORT PEN 31G X 8 MM MISC SMARTSIG:1 Each SUB-Q Daily     Calcium Carbonate-Vit D-Min (CALCIUM 1200 PO) Take by mouth.     Continuous Blood Gluc Sensor (FREESTYLE LIBRE 2 SENSOR) MISC Sensors to be changed every 14 days. DX E11.8 2 each 5   FARXIGA 10 MG TABS tablet TAKE 1 TABLET BY MOUTH DAILY BEFORE BREAKFAST. 30 tablet 5   glucose blood (ON CALL PLUS BLOOD GLUCOSE) test strip Dx DM E11.65. Check fasting blood sugar every morning and 2 hours after large meals daily. Check 3 times daily 300 each prn   insulin degludec (TRESIBA FLEXTOUCH) 100 UNIT/ML FlexTouch Pen INJECT 50-70 UNITS INTO THE SKIN AT BEDTIME. 30 mL 1   losartan (COZAAR) 25 MG tablet TAKE 1/2 TABLET BY MOUTH EVERY DAY 45 tablet 3   metFORMIN (GLUCOPHAGE-XR) 500 MG 24 hr tablet TAKE 2 TABLETS BY MOUTH EVERY DAY WITH BREAKFAST 180 tablet 1   pantoprazole (PROTONIX) 40 MG tablet TAKE 1 TABLET BY MOUTH EVERY DAY 90 tablet 1   Probiotic Product (PROBIOTIC PO) Take by mouth.     rOPINIRole (REQUIP) 3 MG tablet Take 3 mg by mouth daily.     Semaglutide (RYBELSUS) 3 MG TABS Take by mouth.     vitamin E 100 UNIT capsule Take 200 Units by mouth daily.     No current facility-administered medications for  this visit.    Allergies  Allergen Reactions   Bydureon [Exenatide] Nausea And Vomiting   Glipizide Other (See Comments)    Leg cramps, fatigue, and weight gain   Trulicity [Dulaglutide] Diarrhea   Invokana [Canagliflozin] Other (See Comments)    Yeast infection   Victoza [Liraglutide] Nausea Only and Other (See Comments)    Constipation,belching,bloating     Past Medical History:  Diagnosis Date   Diabetes mellitus without complication ([place])    Hyperlipidemia    Hypertension     Past Surgical History:  Procedure Laterality Date   CHOLECYSTECTOMY      Social History   Socioeconomic History   Marital status: Married    Spouse name: Biochemist, clinical   Number of children: 3   Years of education: 16.5   Highest education level: Some college, no degree  Occupational History   Occupation: Hair Stylist  Tobacco Use   Smoking status: Never   Smokeless tobacco: Never  Substance and Sexual Activity   Alcohol use: No    Alcohol/week: 0.0 standard drinks   Drug use: No   Sexual activity: Yes    Partners: Male  Other Topics Concern   Not on file  Social History Narrative   Lives with her husband. She is working full time as a Probation officer. Two of her children live close by. One is in [place]. One  of her daughter's is deceased. Her hobbies include watch tv, listening to music and reading.    Social Determinants of Health   Financial Resource Strain: Low Risk    Difficulty of Paying Living Expenses: Not hard at all  Food Insecurity: No Food Insecurity   Worried About Charity fundraiser in the Last Year: Never true   Hurt in the Last Year: Never true  Transportation Needs: No Transportation Needs   Lack of Transportation (Medical): No   Lack of Transportation (Non-Medical): No  Physical Activity: Inactive   Days of Exercise per Week: 0 days   Minutes of Exercise per Session: 0 min  Stress: No Stress Concern Present   Feeling of Stress : Not at all  Social  Connections: Moderately Integrated   Frequency of Communication with Friends and Family: More than three times a week   Frequency of Social Gatherings with Friends and Family: More than three times a week   Attends Religious Services: Never   Marine scientist or Organizations: Yes   Attends Music therapist: 1 to 4 times per year   Marital Status: Married  Human resources officer Violence: Not At Risk   Fear of Current or Ex-Partner: No   Emotionally Abused: No   Physically Abused: No   Sexually Abused: No    Family History  Problem Relation Age of Onset   Diabetes Father    Cancer Father 41       Esophogeal Ca., Lung.   Hypertension Other    Thyroid disease Other    Colon cancer Daughter 64   Glaucoma Mother    Hepatitis C Mother 59       Died from.   Glaucoma Sister    Glaucoma Sister    Glaucoma Maternal Aunt     ROS: no fevers or chills, productive cough, hemoptysis, dysphasia, odynophagia, melena, hematochezia, dysuria, hematuria, rash, seizure activity, orthopnea, PND, pedal edema, claudication. Remaining systems are negative.  Physical Exam:   Blood pressure 139/80, pulse 71, height 5\' 2"  (1.575 m), weight 200 lb 6.4 oz (90.9 kg), SpO2 93 %.  General:  Well developed/well nourished in NAD Skin warm/dry Patient not depressed No peripheral clubbing Back-normal HEENT-normal/normal eyelids Neck supple/normal carotid upstroke bilaterally; no bruits; no JVD; no thyromegaly chest - CTA/ normal expansion CV - RRR/normal S1 and S2; no murmurs, rubs or gallops;  PMI nondisplaced Abdomen -NT/ND, no HSM, no mass, + bowel sounds, no bruit 2+ femoral pulses, no bruits Ext-no edema, chords, 2+ DP Neuro-grossly nonfocal  ECG -August 07, 2021-sinus rhythm, left bundle branch block.  Personally reviewed  A/P  1 dyspnea-etiology unclear.  She notes some increased dyspnea on exertion as well as fatigue.  We will arrange an echocardiogram to assess LV function.   Schedule cardiac CTA given baseline diabetes and risk factors.  We will also check TSH and hemoglobin.  2 abnormal ECG-patient's electrocardiogram shows sinus with left bundle branch block.  Cardiac CTA as outlined above to exclude ischemia.  3 hypertension-blood pressure mildly elevated.  I have asked her to follow this and we will advance losartan as needed.  4 hyperlipidemia-continue statin.  Kirk Ruths, MD

## 2021-09-12 ENCOUNTER — Other Ambulatory Visit: Payer: Self-pay | Admitting: Family Medicine

## 2021-09-17 ENCOUNTER — Other Ambulatory Visit: Payer: Self-pay | Admitting: Family Medicine

## 2021-09-18 ENCOUNTER — Ambulatory Visit: Payer: Medicare HMO | Admitting: Cardiology

## 2021-09-18 ENCOUNTER — Encounter: Payer: Self-pay | Admitting: Cardiology

## 2021-09-18 ENCOUNTER — Other Ambulatory Visit: Payer: Self-pay

## 2021-09-18 VITALS — BP 139/80 | HR 71 | Ht 62.0 in | Wt 200.4 lb

## 2021-09-18 DIAGNOSIS — R0602 Shortness of breath: Secondary | ICD-10-CM

## 2021-09-18 DIAGNOSIS — E7800 Pure hypercholesterolemia, unspecified: Secondary | ICD-10-CM | POA: Diagnosis not present

## 2021-09-18 DIAGNOSIS — I10 Essential (primary) hypertension: Secondary | ICD-10-CM

## 2021-09-18 DIAGNOSIS — R072 Precordial pain: Secondary | ICD-10-CM | POA: Diagnosis not present

## 2021-09-18 MED ORDER — METOPROLOL TARTRATE 100 MG PO TABS: ORAL_TABLET | ORAL | 0 refills | Status: DC

## 2021-09-18 NOTE — Patient Instructions (Signed)
Testing/Procedures:  Your cardiac CT will be scheduled at   [facility] [place], [place] [address] [phone]  If scheduled at [facility], please arrive at the [facility] main entrance (entrance A) of [facility] 30 minutes prior to test start time. You can use the FREE valet parking offered at the main entrance (encouraged to control the heart rate for the test) Proceed to the [facility] Radiology Department (first floor) to check-in and test prep.   Please follow these instructions carefully (unless otherwise directed):  On the Night Before the Test: Be sure to Drink plenty of water. Do not consume any caffeinated/decaffeinated beverages or chocolate 12 hours prior to your test. Do not take any antihistamines 12 hours prior to your test.   On the Day of the Test: Drink plenty of water until 1 hour prior to the test. Do not eat any food 4 hours prior to the test. You may take your regular medications prior to the test.  Take metoprolol (Lopressor) 100 mg two hours prior to test. HOLD Furosemide/Hydrochlorothiazide morning of the test. FEMALES- please wear underwire-free bra if available, avoid dresses & tight clothing       After the Test: Drink plenty of water. After receiving IV contrast, you may experience a mild flushed feeling. This is normal. On occasion, you may experience a mild rash up to 24 hours after the test. This is not dangerous. If this occurs, you can take Benadryl 25 mg and increase your fluid intake. If you experience trouble breathing, this can be serious. If it is severe call 911 IMMEDIATELY. If it is mild, please call our office. If you take any of these medications: Glipizide/Metformin, Avandament, Glucavance, please do not take 48 hours after completing test unless otherwise instructed.  Please allow 2-4 weeks for scheduling of routine cardiac CTs. Some insurance companies require a pre-authorization  which may delay scheduling of this test.   For non-scheduling related questions, please contact the cardiac imaging nurse navigator should you have any questions/concerns: Marchia Bond, Cardiac Imaging Nurse Navigator Gordy Clement, Cardiac Imaging Nurse Navigator [facility] Direct Office Dial: [phone]   For scheduling needs, including cancellations and rescheduling, please call [place], [phone].   Your physician has requested that you have an echocardiogram. Echocardiography is a painless test that uses sound waves to create images of your heart. It provides your doctor with information about the size and shape of your heart and how well your heart's chambers and valves are working. This procedure takes approximately one hour. There are no restrictions for this procedure. [address]   Follow-Up: At [facility], you and your health needs are our priority.  As part of our continuing mission to provide you with exceptional heart care, we have created designated Provider Care Teams.  These Care Teams include your primary Cardiologist (physician) and Advanced Practice Providers (APPs -  Physician Assistants and Nurse Practitioners) who all work together to provide you with the care you need, when you need it.  We recommend signing up for the patient portal called "MyChart".  Sign up information is provided on this After Visit Summary.  MyChart is used to connect with patients for Virtual Visits (Telemedicine).  Patients are able to view lab/test results, encounter notes, upcoming appointments, etc.  Non-urgent messages can be sent to your provider as well.   To learn more about what you can do with MyChart, go to  [email or web address].    Your next appointment:    AS NEEDED

## 2021-09-19 LAB — CBC
HCT: 40.1 % (ref 35.0–45.0)
Hemoglobin: 13.3 g/dL (ref 11.7–15.5)
MCH: 27.6 pg (ref 27.0–33.0)
MCHC: 33.2 g/dL (ref 32.0–36.0)
MCV: 83.2 fL (ref 80.0–100.0)
MPV: 11.1 fL (ref 7.5–12.5)
Platelets: 275 10*3/uL (ref 140–400)
RBC: 4.82 10*6/uL (ref 3.80–5.10)
RDW: 13.9 % (ref 11.0–15.0)
WBC: 7.9 10*3/uL (ref 3.8–10.8)

## 2021-09-19 LAB — BASIC METABOLIC PANEL
BUN: 17 mg/dL (ref 7–25)
CO2: 24 mmol/L (ref 20–32)
Calcium: 9.9 mg/dL (ref 8.6–10.4)
Chloride: 103 mmol/L (ref 98–110)
Creat: 0.73 mg/dL (ref 0.60–1.00)
Glucose, Bld: 142 mg/dL — ABNORMAL HIGH (ref 65–139)
Potassium: 4 mmol/L (ref 3.5–5.3)
Sodium: 139 mmol/L (ref 135–146)

## 2021-09-19 LAB — TSH: TSH: 3.48 mIU/L (ref 0.40–4.50)

## 2021-09-22 ENCOUNTER — Encounter: Payer: Self-pay | Admitting: *Deleted

## 2021-09-25 ENCOUNTER — Ambulatory Visit (INDEPENDENT_AMBULATORY_CARE_PROVIDER_SITE_OTHER): Payer: Medicare HMO | Admitting: Pharmacist

## 2021-09-25 ENCOUNTER — Other Ambulatory Visit: Payer: Self-pay

## 2021-09-25 DIAGNOSIS — E118 Type 2 diabetes mellitus with unspecified complications: Secondary | ICD-10-CM

## 2021-09-25 DIAGNOSIS — I10 Essential (primary) hypertension: Secondary | ICD-10-CM

## 2021-09-25 DIAGNOSIS — E1169 Type 2 diabetes mellitus with other specified complication: Secondary | ICD-10-CM

## 2021-09-25 DIAGNOSIS — E785 Hyperlipidemia, unspecified: Secondary | ICD-10-CM

## 2021-09-25 NOTE — Progress Notes (Signed)
Chronic Care Management Pharmacy Note  09/25/2021 Name:  Jillian Huynh MRN:  [id number] DOB:  1946/03/14  Summary: addressed HTN, HLD, and primarily DM. Doing well on rybelsus 4m daily.  Patient approved via novonordisk as of 09/04/21 for rybelsus, awaiting shipment. Reconnected patient's new Libre 3 CGM to our practice.  Glucose values as follows: Date of Download: 09/25/21 % Time CGM is active: 82% Average Glucose: 166 mg/dL Glucose Management Indicator: 7.3%  Glucose Variability: 16.1 (goal <36%) Time in Goal:  - Time in range 70-180: 78% - Time above range: 22% - Time below range: 0% Observed patterns: Still highest range 7-9pm  Recommendations/Changes made from today's visit: Making progress toward goals, continue current regimen.  - PCP to please send new RX for Freestyle Libre 3 to patient pharmacy - Rybelsus shipment expected any day now, will call patient if arrives to our office.  - Will facilitate new application for rybelsus & tresiba for 2023.  Plan: f/u with pharmacist in 1 month  Subjective: NColleena Kurtenbachis an 75y.o. year old female who is a primary patient of Metheney, CRene Kocher MD.  The CCM team was consulted for assistance with disease management and care coordination needs.    Engaged with patient by telephone for follow up visit in response to provider referral for pharmacy case management and/or care coordination services.   Consent to Services:  The patient was given information about Chronic Care Management services, agreed to services, and gave verbal consent prior to initiation of services.  Please see initial visit note for detailed documentation.   Patient Care Team: MHali Marry MD as PCP - General (Family Medicine) KDarius Bump [facility]as Pharmacist (Pharmacist)   Objective:  Lab Results  Component Value Date   CREATININE 0.73 09/18/2021   CREATININE 0.59 (L) 12/26/2020   CREATININE 0.56 (L) 06/20/2020    Lab Results   Component Value Date   HGBA1C 8.0 (A) 08/07/2021   Last diabetic Eye exam:  Lab Results  Component Value Date/Time   HMDIABEYEEXA No Retinopathy 08/23/2021 12:00 AM        Component Value Date/Time   CHOL 174 06/20/2020 1040   TRIG 376 (H) 06/20/2020 1040   HDL 43 (L) 06/20/2020 1040   CHOLHDL 4.0 06/20/2020 1040   VLDL 57 (H) 09/19/2016 1057   LDLCALC 81 06/20/2020 1040    Hepatic Function Latest Ref Rng & Units 12/26/2020 06/20/2020 08/25/2019  Total Protein 6.1 - 8.1 g/dL 6.6 6.7 6.7  Albumin 3.6 - 5.1 g/dL - - -  AST 10 - 35 U/L 22 17 15   ALT 6 - 29 U/L 34(H) 17 18  Alk Phosphatase 33 - 130 U/L - - -  Total Bilirubin 0.2 - 1.2 mg/dL 1.2 0.7 1.1    Lab Results  Component Value Date/Time   TSH 3.48 09/18/2021 03:23 PM   TSH 1.99 07/09/2018 03:43 PM    CBC Latest Ref Rng & Units 09/18/2021 12/26/2020 07/09/2018  WBC 3.8 - 10.8 Thousand/uL 7.9 6.7 8.1  Hemoglobin 11.7 - 15.5 g/dL 13.3 12.7 12.3  Hematocrit 35.0 - 45.0 % 40.1 37.8 36.8  Platelets 140 - 400 Thousand/uL 275 254 285    Lab Results  Component Value Date/Time   VD25OH 26 (L) 03/24/2015 09:08 AM    Clinical ASCVD: Yes  The 10-year ASCVD risk score (Arnett DK, et al., 2019) is: 37.9%   Values used to calculate the score:     Age: [age]years  Sex: Female     Is Non-Hispanic African American: No     Diabetic: Yes     Tobacco smoker: No     Systolic Blood Pressure: 947 mmHg     Is BP treated: Yes     HDL Cholesterol: 43 mg/dL     Total Cholesterol: 174 mg/dL     Social History   Tobacco Use  Smoking Status Never  Smokeless Tobacco Never   BP Readings from Last 3 Encounters:  09/18/21 139/80  08/07/21 136/61  05/08/21 (!) 132/52   Pulse Readings from Last 3 Encounters:  09/18/21 71  08/07/21 78  05/08/21 72   Wt Readings from Last 3 Encounters:  09/18/21 200 lb 6.4 oz (90.9 kg)  08/07/21 199 lb (90.3 kg)  05/08/21 198 lb (89.8 kg)    Assessment: Review of patient past medical  history, allergies, medications, health status, including review of consultants reports, laboratory and other test data, was performed as part of comprehensive evaluation and provision of chronic care management services.   SDOH:  (Social Determinants of Health) assessments and interventions performed:    CCM Care Plan  Allergies  Allergen Reactions   Bydureon [Exenatide] Nausea And Vomiting   Glipizide Other (See Comments)    Leg cramps, fatigue, and weight gain   Trulicity [Dulaglutide] Diarrhea   Invokana [Canagliflozin] Other (See Comments)    Yeast infection   Victoza [Liraglutide] Nausea Only and Other (See Comments)    Constipation,belching,bloating    Medications Reviewed Today     Reviewed by Lelon Perla, MD (Physician) on 09/18/21 at [place] Status: <None>   Medication Order Taking? Sig Documenting Provider Last Dose Status Informant  atorvastatin (LIPITOR) 10 MG tablet [id number] Yes TAKE 1 TABLET BY MOUTH EVERYDAY AT BEDTIME [name], MD Taking Active   B-D ULTRAFINE III SHORT PEN 31G X 8 MM MISC [id number] Yes SMARTSIG:1 Each SUB-Q Daily [provider] Taking Active   Calcium Carbonate-Vit D-Min (CALCIUM 1200 PO) [id number] Yes Take by mouth. [provider] Taking Active   Continuous Blood Gluc Sensor (FREESTYLE LIBRE 2 SENSOR) [place] [id number] Yes Sensors to be changed every 14 days. DX E11.8 [name], MD Taking Active   FARXIGA 10 MG TABS tablet [id number] Yes TAKE 1 TABLET BY MOUTH DAILY BEFORE BREAKFAST. [name], MD Taking Active   glucose blood (ON CALL PLUS BLOOD GLUCOSE) test strip [id number] Yes Dx DM E11.65. Check fasting blood sugar every morning and 2 hours after large meals daily. Check 3 times daily [name], MD Taking Active   insulin degludec (TRESIBA FLEXTOUCH) 100 UNIT/ML FlexTouch Pen [id number] Yes INJECT 50-70 UNITS INTO THE SKIN AT BEDTIME. [name], MD Taking  Active   losartan (COZAAR) 25 MG tablet [id number] Yes TAKE 1/2 TABLET BY MOUTH EVERY DAY [name], MD Taking Active   metFORMIN (GLUCOPHAGE-XR) 500 MG 24 hr tablet [id number] Yes TAKE 2 TABLETS BY MOUTH EVERY DAY WITH BREAKFAST [name], MD Taking Active   pantoprazole (PROTONIX) 40 MG tablet [id number] Yes TAKE 1 TABLET BY MOUTH EVERY DAY [name], MD Taking Active   Probiotic Product (PROBIOTIC PO) [id number] Yes Take by mouth. [provider] Taking Active   rOPINIRole (REQUIP) 3 MG tablet [id number] Yes Take 3 mg by mouth daily. [provider] Taking Active   Semaglutide (RYBELSUS) 3 MG TABS [id number] Yes Take by mouth. [provider] Taking Active   vitamin E 100 UNIT  capsule [id number] Yes Take 200 Units by mouth daily. [provider] Taking Active             Patient Active Problem List   Diagnosis Date Noted   Left bundle branch block (LBBB) determined by electrocardiography 08/08/2021   Gastroesophageal reflux disease without esophagitis 09/19/2020   Severe obesity (BMI 35.0-39.9) with comorbidity ([place]) 05/25/2019   Fatty liver 12/25/2017   BP (high blood pressure) 03/28/2015   Dyslipidemia 03/28/2015   Insomnia 03/28/2015   Restless leg 03/28/2015   Mass of arm 03/14/2015   Diabetes mellitus type 2, controlled ([place]) 03/14/2015   Obstructive apnea 09/03/2012   HYPERKERATOSIS 04/01/2009    Immunization History  Administered Date(s) Administered   Moderna Sars-Covid-2 Vaccination 01/06/2020, 02/12/2020   Pneumococcal Conjugate-13 12/25/2017   Tdap 03/26/2007, 06/29/2015    Conditions to be addressed/monitored: HTN, HLD, and DMII  Care Plan : Medication Management  Updates made by Darius Bump, [place] since 09/25/2021 12:00 AM     Problem: HTN, DM, HLD   Priority: High     Long-Range Goal: Disease Progression Prevention   Recent Progress: On track  Priority: High  Note:   Current  Barriers:  Unable to achieve control of diabetes   Pharmacist Clinical Goal(s):  Over the next 30 days, patient will achieve control of diabetes as evidenced by blood glucose values and A1c through collaboration with PharmD and provider.   Interventions: 1:1 collaboration with [name], MD regarding development and update of comprehensive plan of care as evidenced by provider attestation and co-signature Inter-disciplinary care team collaboration (see longitudinal plan of care) Comprehensive medication review performed; medication list updated in electronic medical record  Diabetes:  Uncontrolled; tresiba 35 units daily (self-lowered dosage due to goal of wanting to decrease insulin), farxiga 73m daily, began rybelsus 336mon 07/13/21.  Current glucose readings: via CGM - switched to [place], so data was not merged for past 4 days  Date of Download: 09/25/21 % Time CGM is active: 82% Average Glucose: 166 mg/dL Glucose Management Indicator: 7.3%  Glucose Variability: 16.1 (goal <36%) Time in Goal:  - Time in range 70-180: 78% - Time above range: 22% - Time below range: 0% Observed patterns: Still highest range 7-9pm  Date of Download: 09/04/21 % Time CGM is active: 81% Average Glucose: 166 mg/dL Glucose Management Indicator: 7.3%  Glucose Variability: 16.6% (goal <36%) Time in Goal:  - Time in range 70-180: 73% - Time above range: 27% - Time below range: 0% Observed patterns: BG still highest 8-9pm, but still shows improvement  Date of Download: 07/24/21 % Time CGM is active: 85% Average Glucose: 173 mg/dL Glucose Management Indicator: 7.4  Glucose Variability: 16.9 (goal <36%) Time in Goal:  - Time in range 70-180: 65% - Time above range: 35% - Time below range: 0% Observed patterns: still with elevations 8-10pm, after patient gets off of a long day at work she states she eats late. We are working on this.  Date of Download: 07/12/21 % Time CGM is active:  84% Average Glucose: 197 mg/dL Glucose Management Indicator: 8.0%  Glucose Variability: 24.6 (goal <36%) Time in Goal:  - Time in range 70-180: 45% - Time above range: 55% - Time below range: 0% Observed patterns: Still with highest spikes ~9pm, glucose hits 270-300s.    Denies hyper/hypoglycemic symptoms  Previously discussed current meal patterns:  breakfast: oatmeal, eggs, "stays away from white bread";  lunch: ham & cheese 1/2 sandwhich (w/whole wheat), salad, apple;  dinner: pasta, potatoes, attempts meat/vegetables as much as possible;  snacks: crackers, cheese, sneaks cookie, husband brings sweets into house & patient struggles, describes self as "sugar-holic" ;   Making progress toward goals. Patient also expressed cost constraint with tresiba & farxiga. Patient IS eligible for tresiba & rybelsus patient assistance, but not farxiga. Doing well on new rybelsus 43m daily. Patient was approved via novonordisk for rybelsus as of 09/04/21, awaiting medication shipment.   Hypertension:  Controlled; losartan 262mdaily;   Current home readings: not currently checking  Denies hypotensive/hypertensive symptoms  Recommended continue current regimen  Hyperlipidemia:  Uncontrolled; atorvastatin 1038m  Assessed medication + current labs, consider increasing atorvastatin dose in future visits  Patient Goals/Self-Care Activities Over the next 30 days, patient will:  Take medications as prescribed and utilize CGM, for data to be discussed at future appointments   Follow Up Plan: Telephone follow up appointment with care management team member scheduled for: 30 days         Medication Assistance: Application for rybelsus via Novonordisk  medication assistance program. in process.  Anticipated assistance start date TBD.  See plan of care for additional detail.  Patient's [facility] pharmacy is:  [id number] [place] [id number]: [phone]: [id number]: [phone]: [phone] pill box? Yes Pt endorses 100% compliance  Follow Up:  Patient agrees to Care Plan and Follow-up.  Plan: Telephone follow up appointment with care management team member scheduled for:  1 month  KeesLarinda ButteryarmD Clinical Pharmacist [facility]-[phone]

## 2021-09-25 NOTE — Patient Instructions (Signed)
Visit Information  Patient Goals/Self-Care Activities Over the next 30 days, patient will:  Take medications as prescribed and utilize CGM, for data to be discussed at future appointments   Follow Up Plan: Telephone follow up appointment with care management team member scheduled for:  1 month  Patient verbalizes understanding of instructions provided today and agrees to view in [place].   Telephone follow up appointment with care management team member scheduled for: 1 month  Jillian Huynh

## 2021-09-29 ENCOUNTER — Telehealth (HOSPITAL_COMMUNITY): Payer: Self-pay | Admitting: Emergency Medicine

## 2021-09-29 ENCOUNTER — Other Ambulatory Visit: Payer: Self-pay | Admitting: *Deleted

## 2021-09-29 DIAGNOSIS — E118 Type 2 diabetes mellitus with unspecified complications: Secondary | ICD-10-CM

## 2021-09-29 MED ORDER — FREESTYLE LIBRE 3 SENSOR MISC: 1.0000 | 5 refills | Status: DC

## 2021-09-29 NOTE — Telephone Encounter (Signed)
Reaching out to patient to offer assistance regarding upcoming cardiac imaging study; pt verbalizes understanding of appt date/time, parking situation and where to check in, pre-test NPO status and medications ordered, and verified current allergies; name and call back number provided for further questions should they arise Marchia Bond RN Navigator Cardiac Imaging [facility] [phone] office [phone] cell  Arrival time 9:30a 100mg  metoprolol tartrate  Denies iv issues

## 2021-10-02 ENCOUNTER — Other Ambulatory Visit: Payer: Self-pay

## 2021-10-02 ENCOUNTER — Ambulatory Visit (HOSPITAL_COMMUNITY)
Admission: RE | Admit: 2021-10-02 | Discharge: 2021-10-02 | Disposition: A | Discharge status: Home / Self Care | Payer: Medicare HMO | Source: Ambulatory Visit | Attending: Cardiology | Admitting: Cardiology

## 2021-10-02 DIAGNOSIS — R0602 Shortness of breath: Secondary | ICD-10-CM | POA: Insufficient documentation

## 2021-10-02 DIAGNOSIS — R072 Precordial pain: Secondary | ICD-10-CM | POA: Insufficient documentation

## 2021-10-02 MED ORDER — NITROGLYCERIN 0.4 MG SL SUBL
0.8000 mg | SUBLINGUAL_TABLET | Freq: Once | SUBLINGUAL | Status: AC
  Administered 2021-10-02: 0.8 mg via SUBLINGUAL

## 2021-10-02 MED ORDER — NITROGLYCERIN 0.4 MG SL SUBL
SUBLINGUAL_TABLET | SUBLINGUAL | Status: AC
  Filled 2021-10-02: qty 2

## 2021-10-02 MED ORDER — IOHEXOL 350 MG/ML SOLN
100.0000 mL | Freq: Once | INTRAVENOUS | Status: AC | PRN
  Administered 2021-10-02: 100 mL via INTRAVENOUS

## 2021-10-03 ENCOUNTER — Other Ambulatory Visit: Payer: Self-pay | Admitting: Family Medicine

## 2021-10-03 DIAGNOSIS — E118 Type 2 diabetes mellitus with unspecified complications: Secondary | ICD-10-CM

## 2021-10-04 ENCOUNTER — Encounter: Payer: Self-pay | Admitting: *Deleted

## 2021-10-04 ENCOUNTER — Ambulatory Visit (HOSPITAL_BASED_OUTPATIENT_CLINIC_OR_DEPARTMENT_OTHER)
Admission: RE | Admit: 2021-10-04 | Discharge: 2021-10-04 | Disposition: A | Discharge status: Home / Self Care | Payer: Medicare HMO | Source: Ambulatory Visit | Attending: Cardiology | Admitting: Cardiology

## 2021-10-04 ENCOUNTER — Other Ambulatory Visit: Payer: Self-pay

## 2021-10-04 DIAGNOSIS — R072 Precordial pain: Secondary | ICD-10-CM | POA: Insufficient documentation

## 2021-10-04 DIAGNOSIS — R0602 Shortness of breath: Secondary | ICD-10-CM | POA: Diagnosis not present

## 2021-10-04 LAB — ECHOCARDIOGRAM COMPLETE
AR max vel: 1.97 cm2
AV Area VTI: 1.89 cm2
AV Area mean vel: 1.96 cm2
AV Mean grad: 8 mmHg
AV Peak grad: 15.4 mmHg
Ao pk vel: 1.96 m/s
Area-P 1/2: 1.94 cm2
Calc EF: 48.9 %
MV M vel: 5.99 m/s
MV Peak grad: 143.5 mmHg
P 1/2 time: 1200 msec
S' Lateral: 3.9 cm
Single Plane A2C EF: 46 %
Single Plane A4C EF: 50.4 %

## 2021-10-04 MED ORDER — PERFLUTREN LIPID MICROSPHERE
1.0000 mL | INTRAVENOUS | Status: AC | PRN
  Administered 2021-10-04: 2 mL via INTRAVENOUS

## 2021-10-04 NOTE — Progress Notes (Signed)
HPI: FU dyspnea and abnormal electrocardiogram.  ABIs October 2022 normal.  Echocardiogram November 2022 showed septal hypokinesis, ejection fraction 45 to 50%, moderate left ventricular hypertrophy, grade 1 diastolic dysfunction, moderate mitral regurgitation, mild aortic insufficiency.  I personally reviewed the patient's echocardiogram and feel the ejection fraction is approximately 30%.  The mitral regurgitation appears to be mild.  Coronary CTA November 2022 showed calcium score 162 which was 70th percentile, minimal calcified plaque in the LAD and circumflex.  Since last seen, patient states her dyspnea on exertion has improved.  She denies orthopnea, PND, pedal edema, chest pain or syncope.  Current Outpatient Medications  Medication Sig Dispense Refill   atorvastatin (LIPITOR) 10 MG tablet TAKE 1 TABLET BY MOUTH EVERYDAY AT BEDTIME 90 tablet 3   B-D ULTRAFINE III SHORT PEN 31G X 8 MM MISC SMARTSIG:1 Each SUB-Q Daily     Calcium Carbonate-Vit D-Min (CALCIUM 1200 PO) Take by mouth.     Continuous Blood Gluc Sensor (FREESTYLE LIBRE 3 SENSOR) MISC 1 each by Does not apply route every 14 (fourteen) days. Sensors to be changed every 14 days. DX E11.8 2 each 5   FARXIGA 10 MG TABS tablet TAKE 1 TABLET BY MOUTH EVERY DAY BEFORE BREAKFAST 30 tablet 1   glucose blood (ON CALL PLUS BLOOD GLUCOSE) test strip Dx DM E11.65. Check fasting blood sugar every morning and 2 hours after large meals daily. Check 3 times daily 300 each prn   insulin degludec (TRESIBA FLEXTOUCH) 100 UNIT/ML FlexTouch Pen INJECT 50-70 UNITS INTO THE SKIN AT BEDTIME. 30 mL 1   losartan (COZAAR) 25 MG tablet TAKE 1/2 TABLET BY MOUTH EVERY DAY 45 tablet 3   metFORMIN (GLUCOPHAGE-XR) 500 MG 24 hr tablet TAKE 2 TABLETS BY MOUTH EVERY DAY WITH BREAKFAST 180 tablet 1   metoprolol tartrate (LOPRESSOR) 100 MG tablet Take 2 hours prior to ct scan 1 tablet 0   pantoprazole (PROTONIX) 40 MG tablet TAKE 1 TABLET BY MOUTH EVERY DAY 90  tablet 1   Probiotic Product (PROBIOTIC PO) Take by mouth.     rOPINIRole (REQUIP) 3 MG tablet Take 3 mg by mouth daily.     Semaglutide 7 MG TABS Take 1 tablet by mouth. 3 mg     vitamin E 100 UNIT capsule Take 200 Units by mouth daily.     No current facility-administered medications for this visit.     Past Medical History:  Diagnosis Date   Diabetes mellitus without complication ([place])    Hyperlipidemia    Hypertension     Past Surgical History:  Procedure Laterality Date   CHOLECYSTECTOMY      Social History   Socioeconomic History   Marital status: Married    Spouse name: Biochemist, clinical   Number of children: 3   Years of education: 16.5   Highest education level: Some college, no degree  Occupational History   Occupation: Hair Stylist  Tobacco Use   Smoking status: Never   Smokeless tobacco: Never  Substance and Sexual Activity   Alcohol use: No    Alcohol/week: 0.0 standard drinks   Drug use: No   Sexual activity: Yes    Partners: Male  Other Topics Concern   Not on file  Social History Narrative   Lives with her husband. She is working full time as a Probation officer. Two of her children live close by. One is in [place]. One of her daughter's is deceased. Her hobbies include watch tv, listening  to music and reading.    Social Determinants of Health   Financial Resource Strain: Low Risk    Difficulty of Paying Living Expenses: Not hard at all  Food Insecurity: No Food Insecurity   Worried About Charity fundraiser in the Last Year: Never true   [place] in the Last Year: Never true  Transportation Needs: No Transportation Needs   Lack of Transportation (Medical): No   Lack of Transportation (Non-Medical): No  Physical Activity: Inactive   Days of Exercise per Week: 0 days   Minutes of Exercise per Session: 0 min  Stress: No Stress Concern Present   Feeling of Stress : Not at all  Social Connections: Moderately Integrated   Frequency of  Communication with Friends and Family: More than three times a week   Frequency of Social Gatherings with Friends and Family: More than three times a week   Attends Religious Services: Never   Marine scientist or Organizations: Yes   Attends Music therapist: 1 to 4 times per year   Marital Status: Married  Human resources officer Violence: Not At Risk   Fear of Current or Ex-Partner: No   Emotionally Abused: No   Physically Abused: No   Sexually Abused: No    Family History  Problem Relation Age of Onset   Diabetes Father    Cancer Father 46       Esophogeal Ca., Lung.   Hypertension Other    Thyroid disease Other    Colon cancer Daughter 52   Glaucoma Mother    Hepatitis C Mother 69       Died from.   Glaucoma Sister    Glaucoma Sister    Glaucoma Maternal Aunt     ROS: no fevers or chills, productive cough, hemoptysis, dysphasia, odynophagia, melena, hematochezia, dysuria, hematuria, rash, seizure activity, orthopnea, PND, pedal edema, claudication. Remaining systems are negative.  Physical Exam: Well-developed well-nourished in no acute distress.  Skin is warm and dry.  HEENT is normal.  Neck is supple.  Chest is clear to auscultation with normal expansion.  Cardiovascular exam is regular rate and rhythm.  Abdominal exam nontender or distended. No masses palpated. Extremities show no edema. neuro grossly intact  A/P  1 nonischemic cardiomyopathy-I have personally reviewed the patient's echocardiogram.  I think her ejection fraction is in the 30% range.  Etiology unclear.  No evidence of significant coronary disease on cardiac CTA.  TSH is normal.  No history of alcohol abuse.  Question related to left bundle branch block/dyssynchrony.  Discontinue losartan.  Instead treat with Entresto 24/26 twice daily.  Check potassium and renal function in 1 week.  Continue Farxiga.  Add carvedilol 3.125 mg twice daily.  We discussed the potential for cardiac MRI to  exclude infiltrative cardiomyopathy.  However she has significant claustrophobia.  We will consider in the future.  I will have her follow-up in the CHF clinic for medication titration.  If blood pressure allows would add spironolactone at next office visit.  Once fully titrated we will plan to repeat echocardiogram to see if LV function has improved.  If not may need to consider CRT-D.  2 chronic systolic congestive heart failure-relatively euvolemic.  We will consider addition of spironolactone at next office visit pending blood pressure and renal function.  3 hypertension-blood pressure mildly elevated.  I am discontinuing losartan and beginning Entresto and carvedilol as outlined above.  4 hyperlipidemia-given history of diabetes mellitus and mild plaque  in coronaries we will increase Lipitor to 40 mg daily.  Check lipids and liver in 8 weeks.  5 mitral regurgitation-mild on my review of patient's echocardiogram.  Kirk Ruths, MD

## 2021-10-09 ENCOUNTER — Ambulatory Visit: Payer: Medicare HMO | Admitting: Pharmacist

## 2021-10-09 ENCOUNTER — Telehealth: Payer: Self-pay | Admitting: Pharmacist

## 2021-10-09 ENCOUNTER — Other Ambulatory Visit: Payer: Self-pay

## 2021-10-09 DIAGNOSIS — E785 Hyperlipidemia, unspecified: Secondary | ICD-10-CM

## 2021-10-09 DIAGNOSIS — E1169 Type 2 diabetes mellitus with other specified complication: Secondary | ICD-10-CM

## 2021-10-09 DIAGNOSIS — E118 Type 2 diabetes mellitus with unspecified complications: Secondary | ICD-10-CM

## 2021-10-09 DIAGNOSIS — I10 Essential (primary) hypertension: Secondary | ICD-10-CM

## 2021-10-09 NOTE — Progress Notes (Signed)
Chronic Care Management Pharmacy Note  10/10/2021 Name:  Jillian Huynh MRN:  [id number] DOB:  06-03-1946  Summary: addressed HTN, HLD, and primarily DM. Patient approved via novonordisk as of 09/04/21 for rybelsus, picked up medication supply for rybelsus 39m daily on 10/09/21. Also provided new application for novonordisk for 2023, patient to fill out and return. This should provide assistance for both tresiba and rybelsus (current 2022 assistance is for rybelsus only).  Glucose values as follows: Date of Download: 10/09/21 % Time CGM is active: 78% Average Glucose: 192 mg/dL Glucose Management Indicator: 7.9%  Glucose Variability: 21.1% (goal <36%) Time in Goal:  - Time in range 70-180: 46% - Time above range: 54% - Time below range: 0% Observed patterns:erratic, eating habits have changed over holiday season, which is to be expected.  Recommendations/Changes made from today's visit: Increase to rybelsus 718mdaily with patient assistance medication supply. - New application for rybelsus & tresiba in progress for 2023.  Plan: f/u with pharmacist in 1 month  Subjective: [name] an [age].o. year old female who is a primary patient of [place], CaRene KocherMD.  The CCM team was consulted for assistance with disease management and care coordination needs.    Engaged with patient by telephone for follow up visit in response to provider referral for pharmacy case management and/or care coordination services.   Consent to Services:  The patient was given information about Chronic Care Management services, agreed to services, and gave verbal consent prior to initiation of services.  Please see initial visit note for detailed documentation.   Patient Care Team: MeHali MarryMD as PCP - General (Family Medicine) [facility]arius [facility] Pharmacist (Pharmacist)   Objective:  Lab Results  Component Value Date   CREATININE 0.73 09/18/2021   CREATININE 0.59 (L)  12/26/2020   CREATININE 0.56 (L) 06/20/2020    Lab Results  Component Value Date   HGBA1C 8.0 (A) 08/07/2021   Last diabetic Eye exam:  Lab Results  Component Value Date/Time   HMDIABEYEEXA No Retinopathy 08/23/2021 12:00 AM        Component Value Date/Time   CHOL 174 06/20/2020 1040   TRIG 376 (H) 06/20/2020 1040   HDL 43 (L) 06/20/2020 1040   CHOLHDL 4.0 06/20/2020 1040   VLDL 57 (H) 09/19/2016 1057   LDLCALC 81 06/20/2020 1040    Hepatic Function Latest Ref Rng & Units 12/26/2020 06/20/2020 08/25/2019  Total Protein 6.1 - 8.1 g/dL 6.6 6.7 6.7  Albumin 3.6 - 5.1 g/dL - - -  AST 10 - 35 U/L _0 ALT 6 - 29 U/L 34(H) 17 18  Alk Phosphatase 33 - 130 U/L - - -  Total Bilirubin 0.2 - 1.2 mg/dL 1.2 0.7 1.1    Lab Results  Component Value Date/Time   TSH 3.48 09/18/2021 03:23 PM   TSH 1.99 07/09/2018 03:43 PM    CBC Latest Ref Rng & Units 09/18/2021 12/26/2020 07/09/2018  WBC 3.8 - 10.8 Thousand/uL 7.9 6.7 8.1  Hemoglobin 11.7 - 15.5 g/dL 13.3 12.7 12.3  Hematocrit 35.0 - 45.0 % 40.1 37.8 36.8  Platelets 140 - 400 Thousand/uL 275 254 285    Lab Results  Component Value Date/Time   VD25OH 26 (L) 03/24/2015 09:08 AM    Clinical ASCVD: Yes  The 10-year ASCVD risk score (Arnett DK, et al., 2019) is: 37.1%   Values used to calculate the score:     Age: [age]  Sex: Female     Is Non-Hispanic African American: No     Diabetic: Yes     Tobacco smoker: No     Systolic Blood Pressure: 244 mmHg     Is BP treated: Yes     HDL Cholesterol: 43 mg/dL     Total Cholesterol: 174 mg/dL     Social History   Tobacco Use  Smoking Status Never  Smokeless Tobacco Never   BP Readings from Last 3 Encounters:  10/02/21 137/80  09/18/21 139/80  08/07/21 136/61   Pulse Readings from Last 3 Encounters:  10/02/21 (!) 59  09/18/21 71  08/07/21 78   Wt Readings from Last 3 Encounters:  09/18/21 200 lb 6.4 oz (90.9 kg)  08/07/21 199 lb (90.3 kg)  05/08/21 198 lb  (89.8 kg)    Assessment: Review of patient past medical history, allergies, medications, health status, including review of consultants reports, laboratory and other test data, was performed as part of comprehensive evaluation and provision of chronic care management services.   SDOH:  (Social Determinants of Health) assessments and interventions performed:    CCM Care Plan  Allergies  Allergen Reactions   Bydureon [Exenatide] Nausea And Vomiting   Glipizide Other (See Comments)    Leg cramps, fatigue, and weight gain   Trulicity [Dulaglutide] Diarrhea   Invokana [Canagliflozin] Other (See Comments)    Yeast infection   Victoza [Liraglutide] Nausea Only and Other (See Comments)    Constipation,belching,bloating    Medications Reviewed Today     Reviewed by Lelon Perla, MD (Physician) on 09/18/21 at [place] List Status: <None>   Medication Order Taking? Sig Documenting Provider Last Dose Status Informant  atorvastatin (LIPITOR) 10 MG tablet [id number] Yes TAKE 1 TABLET BY MOUTH EVERYDAY AT BEDTIME [name], MD Taking Active   B-D ULTRAFINE III SHORT PEN 31G X 8 MM MISC [id number] Yes SMARTSIG:1 Each SUB-Q Daily [provider] Taking Active   Calcium Carbonate-Vit D-Min (CALCIUM 1200 PO) [id number] Yes Take by mouth. [provider] Taking Active   Continuous Blood Gluc Sensor (FREESTYLE LIBRE 2 SENSOR) [place] [id number] Yes Sensors to be changed every 14 days. DX E11.8 [name], MD Taking Active   FARXIGA 10 MG TABS tablet [id number] Yes TAKE 1 TABLET BY MOUTH DAILY BEFORE BREAKFAST. [name], MD Taking Active   glucose blood (ON CALL PLUS BLOOD GLUCOSE) test strip [id number] Yes Dx DM E11.65. Check fasting blood sugar every morning and 2 hours after large meals daily. Check 3 times daily [name], MD Taking Active   insulin degludec (TRESIBA FLEXTOUCH) 100 UNIT/ML FlexTouch Pen [id number] Yes INJECT 50-70 UNITS  INTO THE SKIN AT BEDTIME. [name], MD Taking Active   losartan (COZAAR) 25 MG tablet [id number] Yes TAKE 1/2 TABLET BY MOUTH EVERY DAY [name], MD Taking Active   metFORMIN (GLUCOPHAGE-XR) 500 MG 24 hr tablet [id number] Yes TAKE 2 TABLETS BY MOUTH EVERY DAY WITH BREAKFAST [name], MD Taking Active   pantoprazole (PROTONIX) 40 MG tablet [id number] Yes TAKE 1 TABLET BY MOUTH EVERY DAY [name], MD Taking Active   Probiotic Product (PROBIOTIC PO) [id number] Yes Take by mouth. [provider] Taking Active   rOPINIRole (REQUIP) 3 MG tablet [id number] Yes Take 3 mg by mouth daily. [provider] Taking Active   Semaglutide (RYBELSUS) 3 MG TABS [id number] Yes Take by mouth. [provider] Taking Active   vitamin E 100 UNIT  capsule [id number] Yes Take 200 Units by mouth daily. [provider] Taking Active             Patient Active Problem List   Diagnosis Date Noted   Left bundle branch block (LBBB) determined by electrocardiography 08/08/2021   Gastroesophageal reflux disease without esophagitis 09/19/2020   Severe obesity (BMI 35.0-39.9) with comorbidity ([place]) 05/25/2019   Fatty liver 12/25/2017   BP (high blood pressure) 03/28/2015   Dyslipidemia 03/28/2015   Insomnia 03/28/2015   Restless leg 03/28/2015   Mass of arm 03/14/2015   Diabetes mellitus type 2, controlled ([place]) 03/14/2015   Obstructive apnea 09/03/2012   HYPERKERATOSIS 04/01/2009    Immunization History  Administered Date(s) Administered   Moderna Sars-Covid-2 Vaccination 01/06/2020, 02/12/2020   Pneumococcal Conjugate-13 12/25/2017   Tdap 03/26/2007, 06/29/2015    Conditions to be addressed/monitored: HTN, HLD, and DMII  Care Plan : Medication Management  Updates made by Darius Bump, [place] since 10/10/2021 12:00 AM     Problem: HTN, DM, HLD   Priority: High     Long-Range Goal: Disease Progression Prevention   Recent  Progress: On track  Priority: High  Note:   Current Barriers:  Unable to achieve control of diabetes   Pharmacist Clinical Goal(s):  Over the next 30 days, patient will achieve control of diabetes as evidenced by blood glucose values and A1c through collaboration with PharmD and provider.   Interventions: 1:1 collaboration with [name], MD regarding development and update of comprehensive plan of care as evidenced by provider attestation and co-signature Inter-disciplinary care team collaboration (see longitudinal plan of care) Comprehensive medication review performed; medication list updated in electronic medical record  Diabetes:  Uncontrolled; tresiba 35 units daily (self-lowered dosage due to goal of wanting to decrease insulin), farxiga $RemoveBeforeDEI'10mg'jsbpsKhJpUUcGZWA$  daily, began rybelsus $RemoveBefore'3mg'yXKozwhduVsoB$  on 07/13/21.  Current glucose readings: via CGM - switched to [place] 3  Date of Download: 10/09/21 % Time CGM is active: 78% Average Glucose: 192 mg/dL Glucose Management Indicator: 7.9%  Glucose Variability: 21.1% (goal <36%) Time in Goal:  - Time in range 70-180: 46% - Time above range: 54% - Time below range: 0% Observed patterns:erratic, eating habits have changed over holiday season, which is to be expected.  Date of Download: 09/25/21 % Time CGM is active: 82% Average Glucose: 166 mg/dL Glucose Management Indicator: 7.3%  Glucose Variability: 16.1 (goal <36%) Time in Goal:  - Time in range 70-180: 78% - Time above range: 22% - Time below range: 0% Observed patterns: Still highest range 7-9pm  Date of Download: 09/04/21 % Time CGM is active: 81% Average Glucose: 166 mg/dL Glucose Management Indicator: 7.3%  Glucose Variability: 16.6% (goal <36%) Time in Goal:  - Time in range 70-180: 73% - Time above range: 27% - Time below range: 0% Observed patterns: BG still highest 8-9pm, but still shows improvement  Date of Download: 07/24/21 % Time CGM is active: 85% Average Glucose: 173  mg/dL Glucose Management Indicator: 7.4  Glucose Variability: 16.9 (goal <36%) Time in Goal:  - Time in range 70-180: 65% - Time above range: 35% - Time below range: 0% Observed patterns: still with elevations 8-10pm, after patient gets off of a long day at work she states she eats late. We are working on this.  Date of Download: 07/12/21 % Time CGM is active: 84% Average Glucose: 197 mg/dL Glucose Management Indicator: 8.0%  Glucose Variability: 24.6 (goal <36%) Time in Goal:  - Time in range 70-180: 45% - Time  above range: 55% - Time below range: 0% Observed patterns: Still with highest spikes ~9pm, glucose hits 270-300s.    Denies hyper/hypoglycemic symptoms  Previously discussed current meal patterns:  breakfast: oatmeal, eggs, "stays away from white bread";  lunch: ham & cheese 1/2 sandwhich (w/whole wheat), salad, apple;  dinner: pasta, potatoes, attempts meat/vegetables as much as possible;  snacks: crackers, cheese, sneaks cookie, husband brings sweets into house & patient struggles, describes self as "sugar-holic" ;   Making progress toward goals. Patient also expressed cost constraint with tresiba & farxiga. Patient IS eligible for tresiba & rybelsus patient assistance, but not farxiga. Increase to rybelsus 64m daily using medication supply from patient assistance.   Hypertension:  Controlled; losartan 274mdaily;   Current home readings: not currently checking  Denies hypotensive/hypertensive symptoms  Recommended continue current regimen  Hyperlipidemia:  Uncontrolled; atorvastatin 102m  Assessed medication + current labs, consider increasing atorvastatin dose in future visits  Patient Goals/Self-Care Activities Over the next 30 days, patient will:  Take medications as prescribed and utilize CGM, for data to be discussed at future appointments   Follow Up Plan: Face to face follow up appointment with care management team member scheduled for: 30 days           Medication Assistance:  Rybelsus obtained through novonordisk medication assistance program.  Enrollment ends Nov 11 2021. New application in process for 2023 year to include rybelsus and tresiba.  Patient's preferred pharmacy is:  [id number] [place] [id number]: [phone]: [phone] [id number]: [phone]: [phone] pill box? Yes Pt endorses 100% compliance  Follow Up:  Patient agrees to Care Plan and Follow-up.  Plan: Face to Face appointment with care management team member scheduled for: 1 month  KeesLarinda ButteryarmD Clinical Pharmacist [facility]-[phone]

## 2021-10-09 NOTE — Progress Notes (Signed)
I had called [employer] about patients shipment status on Rybelsus, and stated the shipment would go out today, but couldn't not give me further information unless I verified the PCP's information but the PCP that they had listed was a different provider and the couldn't further process the status of the medication.  Corrie [place], [place]

## 2021-10-10 NOTE — Patient Instructions (Signed)
Visit Information  Thank you for taking time to visit with me today. Please don't hesitate to contact me if I can be of assistance to you before our next scheduled telephone appointment.  Following are the goals we discussed today:  Patient Goals/Self-Care Activities Over the next 30 days, patient will:  Take medications as prescribed and utilize CGM, for data to be discussed at future appointments   Follow Up Plan: Face to face follow up appointment with care management team member scheduled for: 30 days  Please call the care guide team at [phone] if you need to cancel or reschedule your appointment.    Patient verbalizes understanding of instructions provided today and agrees to view in [place].   Jillian Huynh

## 2021-10-11 DIAGNOSIS — Z794 Long term (current) use of insulin: Secondary | ICD-10-CM

## 2021-10-11 DIAGNOSIS — I10 Essential (primary) hypertension: Secondary | ICD-10-CM | POA: Diagnosis not present

## 2021-10-11 DIAGNOSIS — E1169 Type 2 diabetes mellitus with other specified complication: Secondary | ICD-10-CM

## 2021-10-11 DIAGNOSIS — E785 Hyperlipidemia, unspecified: Secondary | ICD-10-CM

## 2021-10-12 ENCOUNTER — Telehealth: Payer: Self-pay

## 2021-10-12 NOTE — Telephone Encounter (Signed)
Pt called stating that she is having flu-like symptoms and wants to be tested.  Advised pt that she would need OV and would need to do an at home COVID test to determine if her symptoms were related to COVID rather than flu.  Offered pt appt for tomorrow with [place] Planas, but pt declined stating that she has to return to work tomorrow.  Pt stated that she would do a COVID test at home though.  Advised pt to call back if she changed her mind and wanted to schedule an appt.  Pt expressed understanding and is agreeable.  Charyl Bigger, CMA

## 2021-10-16 ENCOUNTER — Other Ambulatory Visit: Payer: Self-pay

## 2021-10-16 ENCOUNTER — Encounter: Payer: Self-pay | Admitting: Cardiology

## 2021-10-16 ENCOUNTER — Ambulatory Visit: Payer: Medicare HMO | Admitting: Cardiology

## 2021-10-16 VITALS — BP 145/77 | HR 72 | Ht 62.0 in | Wt 200.1 lb

## 2021-10-16 DIAGNOSIS — I10 Essential (primary) hypertension: Secondary | ICD-10-CM

## 2021-10-16 DIAGNOSIS — E7800 Pure hypercholesterolemia, unspecified: Secondary | ICD-10-CM

## 2021-10-16 DIAGNOSIS — I429 Cardiomyopathy, unspecified: Secondary | ICD-10-CM

## 2021-10-16 DIAGNOSIS — I5022 Chronic systolic (congestive) heart failure: Secondary | ICD-10-CM | POA: Diagnosis not present

## 2021-10-16 MED ORDER — SACUBITRIL-VALSARTAN 24-26 MG PO TABS
1.0000 | ORAL_TABLET | Freq: Two times a day (BID) | ORAL | 11 refills | Status: DC
Start: 2021-10-16 — End: 2021-12-11

## 2021-10-16 MED ORDER — CARVEDILOL 3.125 MG PO TABS: 3.1250 mg | ORAL_TABLET | Freq: Two times a day (BID) | ORAL | 3 refills | Status: DC

## 2021-10-16 MED ORDER — ATORVASTATIN CALCIUM 40 MG PO TABS: 40.0000 mg | ORAL_TABLET | Freq: Every day | ORAL | 3 refills | Status: DC

## 2021-10-16 NOTE — Patient Instructions (Signed)
Medication Instructions:   INCREASE ATORVASTATIN TO 40 MG ONCE DAILY= 4 OF THE 10 MG TABLETS ONCE DAILY  STOP LOSARTAN  START ENTRESTO 24/26 MG ONE TABLET TWICE DAILY  START CARVEDILOL 3.125 MG ONE TABLET TWICE DAILY  *If you need a refill on your cardiac medications before your next appointment, please call your pharmacy*   Lab Work:  Your physician recommends that you return for lab work in: [place] physician recommends that you return for lab work in: [facility]  If you have labs (blood work) drawn today and your tests are completely normal, you will receive your results only by: [employer] (if you have MyChart) OR A paper copy in the mail If you have any lab test that is abnormal or we need to change your treatment, we will call you to review the results.   Follow-Up: At [facility], you and your health needs are our priority.  As part of our continuing mission to provide you with exceptional heart care, we have created designated Provider Care Teams.  These Care Teams include your primary Cardiologist (physician) and Advanced Practice Providers (APPs -  Physician Assistants and Nurse Practitioners) who all work together to provide you with the care you need, when you need it.  We recommend signing up for the patient portal called "MyChart".  Sign up information is provided on this After Visit Summary.  MyChart is used to connect with patients for Virtual Visits (Telemedicine).  Patients are able to view lab/test results, encounter notes, upcoming appointments, etc.  Non-urgent messages can be sent to your provider as well.   To learn more about what you can do with MyChart, go to [email or web address].    Your next appointment:   4-6 month(s)  The format for your next appointment:   In Person  Provider:   Kirk Ruths MD

## 2021-10-24 DIAGNOSIS — E7800 Pure hypercholesterolemia, unspecified: Secondary | ICD-10-CM | POA: Diagnosis not present

## 2021-10-24 DIAGNOSIS — I429 Cardiomyopathy, unspecified: Secondary | ICD-10-CM | POA: Diagnosis not present

## 2021-10-25 ENCOUNTER — Other Ambulatory Visit: Payer: Self-pay | Admitting: Family Medicine

## 2021-10-25 ENCOUNTER — Telehealth: Payer: Self-pay | Admitting: *Deleted

## 2021-10-25 DIAGNOSIS — E7800 Pure hypercholesterolemia, unspecified: Secondary | ICD-10-CM

## 2021-10-25 LAB — HEPATIC FUNCTION PANEL
AG Ratio: 1.8 (calc) (ref 1.0–2.5)
ALT: 14 U/L (ref 6–29)
AST: 9 U/L — ABNORMAL LOW (ref 10–35)
Albumin: 4.2 g/dL (ref 3.6–5.1)
Alkaline phosphatase (APISO): 72 U/L (ref 37–153)
Bilirubin, Direct: 0.1 mg/dL (ref 0.0–0.2)
Globulin: 2.3 g/dL (calc) (ref 1.9–3.7)
Indirect Bilirubin: 0.7 mg/dL (calc) (ref 0.2–1.2)
Total Bilirubin: 0.8 mg/dL (ref 0.2–1.2)
Total Protein: 6.5 g/dL (ref 6.1–8.1)

## 2021-10-25 LAB — LIPID PANEL
Cholesterol: 142 mg/dL (ref <200)
HDL: 40 mg/dL — ABNORMAL LOW (ref >=50)
LDL Cholesterol (Calc): 71 mg/dL (calc)
Non-HDL Cholesterol (Calc): 102 mg/dL (calc) (ref <130)
Total CHOL/HDL Ratio: 3.6 (calc) (ref <5.0)
Triglycerides: 226 mg/dL — ABNORMAL HIGH (ref <150)

## 2021-10-25 LAB — BASIC METABOLIC PANEL
BUN: 16 mg/dL (ref 7–25)
CO2: 24 mmol/L (ref 20–32)
Calcium: 9.6 mg/dL (ref 8.6–10.4)
Chloride: 105 mmol/L (ref 98–110)
Creat: 0.67 mg/dL (ref 0.60–1.00)
Glucose, Bld: 197 mg/dL — ABNORMAL HIGH (ref 65–99)
Potassium: 4.3 mmol/L (ref 3.5–5.3)
Sodium: 140 mmol/L (ref 135–146)

## 2021-10-25 MED ORDER — EZETIMIBE 10 MG PO TABS: 10.0000 mg | ORAL_TABLET | Freq: Every day | ORAL | 3 refills | Status: DC

## 2021-10-25 NOTE — Telephone Encounter (Signed)
-----   Message from Lelon Perla, MD sent at 10/25/2021  7:57 AM EST ----- Add zetia 10 mg daily; lipids and liver 12 weeks Kirk Ruths

## 2021-10-25 NOTE — Telephone Encounter (Signed)
Results released to my chart  New script sent to the pharmacy  Lab orders mailed to the pt

## 2021-11-01 ENCOUNTER — Encounter: Payer: Self-pay | Admitting: *Deleted

## 2021-11-08 ENCOUNTER — Encounter: Payer: Self-pay | Admitting: Family Medicine

## 2021-11-08 ENCOUNTER — Ambulatory Visit (INDEPENDENT_AMBULATORY_CARE_PROVIDER_SITE_OTHER): Payer: Medicare HMO | Admitting: Pharmacist

## 2021-11-08 ENCOUNTER — Ambulatory Visit (INDEPENDENT_AMBULATORY_CARE_PROVIDER_SITE_OTHER): Payer: Medicare HMO | Admitting: Family Medicine

## 2021-11-08 ENCOUNTER — Other Ambulatory Visit: Payer: Self-pay

## 2021-11-08 VITALS — BP 149/78 | HR 61

## 2021-11-08 VITALS — BP 130/81 | HR 60 | Resp 16 | Wt 193.5 lb

## 2021-11-08 DIAGNOSIS — E118 Type 2 diabetes mellitus with unspecified complications: Secondary | ICD-10-CM

## 2021-11-08 DIAGNOSIS — I10 Essential (primary) hypertension: Secondary | ICD-10-CM

## 2021-11-08 DIAGNOSIS — E785 Hyperlipidemia, unspecified: Secondary | ICD-10-CM

## 2021-11-08 LAB — POCT GLYCOSYLATED HEMOGLOBIN (HGB A1C): HbA1c, POC (controlled diabetic range): 8.5 % — AB (ref 0.0–7.0)

## 2021-11-08 NOTE — Assessment & Plan Note (Addendum)
Uncontrolled.  A1C is up from previous.  Her semaglutide that was delivered was actually the 3 mg instead of the 7.5 mg so she is actually on the lower dose and she is supposed to be on.  We will hopefully get this corrected in the new year.  Follow-up in 3 months.  We also discussed setting small goals weekly to make some changes with her diet she admits she has been a little bit more indulgent over the holidays.  We also discussed getting up and just walking during commercial breaks when she is watching TV.  She will actually be starting cardiac rehab soon.  Encouraged her not to skip her insulin completely just because her fasting blood sugars look good.  Just reminded her that it is a long-acting insulin and that she is giving it for the day that is coming more not necessarily actively treating her current blood sugar level.  So reminded her not to use her insulin past a month when she has started using the vial or the pen.

## 2021-11-08 NOTE — Progress Notes (Signed)
Established Patient Office Visit  Subjective:  Patient ID: Jillian Huynh, female    DOB: [birth year]  Age: 75 y.o. MRN: [id number]  CC:  Chief Complaint  Patient presents with   Diabetes    Follow up     HPI Jillian Huynh presents for   Diabetes - no hypoglycemic events. No wounds or sores that are not healing well. No increased thirst or urination. Checking glucose at home. Taking medications as prescribed without any side effects.  He also reports that when her blood sugars look good she tends to skip her insulin completely.  When she does take it she usually gets between 30 and 40 units.  Hypertension- Pt denies chest pain, SOB, dizziness, or heart palpitations.  Taking meds as directed w/o problems.  Denies medication side effects.    She did see cardiology about 3 weeks ago for her nonischemic cardiomyopathy.  He decided to switch her to [facility] 24/26 twice a day.  And continue [place].  He also added carvedilol 3.125 mg twice a day.  He also recommended the addition of spironolactone at the next office visit if blood pressure would allow.  Past Medical History:  Diagnosis Date   Diabetes mellitus without complication ([place])    Hyperlipidemia    Hypertension     Past Surgical History:  Procedure Laterality Date   CHOLECYSTECTOMY      Family History  Problem Relation Age of Onset   Diabetes Father    Cancer Father 89       Esophogeal Ca., Lung.   Hypertension Other    Thyroid disease Other    Colon cancer Daughter 39   Glaucoma Mother    Hepatitis C Mother 82       Died from.   Glaucoma Sister    Glaucoma Sister    Glaucoma Maternal Aunt     Social History   Socioeconomic History   Marital status: Married    Spouse name: Biochemist, clinical   Number of children: 3   Years of education: 16.5   Highest education level: Some college, no degree  Occupational History   Occupation: Hair Stylist  Tobacco Use   Smoking status: Never   Smokeless tobacco: Never   Substance and Sexual Activity   Alcohol use: No    Alcohol/week: 0.0 standard drinks   Drug use: No   Sexual activity: Yes    Partners: Male  Other Topics Concern   Not on file  Social History Narrative   Lives with her husband. She is working full time as a Probation officer. Two of her children live close by. One is in [place]. One of her daughter's is deceased. Her hobbies include watch tv, listening to music and reading.    Social Determinants of Health   Financial Resource Strain: Low Risk    Difficulty of Paying Living Expenses: Not hard at all  Food Insecurity: No Food Insecurity   Worried About Charity fundraiser in the Last Year: Never true   [place] in the Last Year: Never true  Transportation Needs: No Transportation Needs   Lack of Transportation (Medical): No   Lack of Transportation (Non-Medical): No  Physical Activity: Inactive   Days of Exercise per Week: 0 days   Minutes of Exercise per Session: 0 min  Stress: No Stress Concern Present   Feeling of Stress : Not at all  Social Connections: Moderately Integrated   Frequency of Communication with Friends and Family: More than three  times a week   Frequency of Social Gatherings with Friends and Family: More than three times a week   Attends Religious Services: Never   Marine scientist or Organizations: Yes   Attends Music therapist: 1 to 4 times per year   Marital Status: Married  Human resources officer Violence: Not At Risk   Fear of Current or Ex-Partner: No   Emotionally Abused: No   Physically Abused: No   Sexually Abused: No    Outpatient Medications Prior to Visit  Medication Sig Dispense Refill   atorvastatin (LIPITOR) 40 MG tablet Take 1 tablet (40 mg total) by mouth daily. 90 tablet 3   B-D ULTRAFINE III SHORT PEN 31G X 8 MM MISC SMARTSIG:1 Each SUB-Q Daily     Calcium Carbonate-Vit D-Min (CALCIUM 1200 PO) Take by mouth.     carvedilol (COREG) 3.125 MG tablet Take 1 tablet (3.125  mg total) by mouth 2 (two) times daily. 180 tablet 3   Continuous Blood Gluc Sensor (FREESTYLE LIBRE 3 SENSOR) MISC 1 each by Does not apply route every 14 (fourteen) days. Sensors to be changed every 14 days. DX E11.8 2 each 5   ezetimibe (ZETIA) 10 MG tablet Take 1 tablet (10 mg total) by mouth daily. 90 tablet 3   FARXIGA 10 MG TABS tablet TAKE 1 TABLET BY MOUTH EVERY DAY BEFORE BREAKFAST 30 tablet 1   glucose blood (ON CALL PLUS BLOOD GLUCOSE) test strip Dx DM E11.65. Check fasting blood sugar every morning and 2 hours after large meals daily. Check 3 times daily 300 each prn   insulin degludec (TRESIBA FLEXTOUCH) 100 UNIT/ML FlexTouch Pen INJECT 50-70 UNITS INTO THE SKIN AT BEDTIME. 30 mL 1   metFORMIN (GLUCOPHAGE-XR) 500 MG 24 hr tablet TAKE 2 TABLETS BY MOUTH EVERY DAY WITH BREAKFAST 180 tablet 1   pantoprazole (PROTONIX) 40 MG tablet TAKE 1 TABLET BY MOUTH EVERY DAY 90 tablet 1   Probiotic Product (PROBIOTIC PO) Take by mouth.     rOPINIRole (REQUIP) 3 MG tablet TAKE 1 TABLET BY MOUTH EVERY DAY 90 tablet 1   sacubitril-valsartan (ENTRESTO) 24-26 MG Take 1 tablet by mouth 2 (two) times daily. 60 tablet 11   Semaglutide 7 MG TABS Take 1 tablet by mouth. 3 mg     vitamin E 100 UNIT capsule Take 200 Units by mouth daily.     No facility-administered medications prior to visit.    Allergies  Allergen Reactions   Bydureon [Exenatide] Nausea And Vomiting   Glipizide Other (See Comments)    Leg cramps, fatigue, and weight gain   Trulicity [Dulaglutide] Diarrhea   Invokana [Canagliflozin] Other (See Comments)    Yeast infection   Victoza [Liraglutide] Nausea Only and Other (See Comments)    Constipation,belching,bloating    ROS Review of Systems    Objective:    Physical Exam Constitutional:      Appearance: Normal appearance. She is well-developed.  HENT:     Head: Normocephalic and atraumatic.  Cardiovascular:     Rate and Rhythm: Normal rate and regular rhythm.     Heart  sounds: Normal heart sounds.  Pulmonary:     Effort: Pulmonary effort is normal.     Breath sounds: Normal breath sounds.  Skin:    General: Skin is warm and dry.  Neurological:     Mental Status: She is alert and oriented to person, place, and time.  Psychiatric:        Behavior: Behavior normal.  BP 130/81    Pulse 60    Resp 16    Wt 193 lb 8 oz (87.8 kg)    BMI 35.39 kg/m  Wt Readings from Last 3 Encounters:  11/08/21 193 lb 8 oz (87.8 kg)  10/16/21 200 lb 1.9 oz (90.8 kg)  09/18/21 200 lb 6.4 oz (90.9 kg)     There are no preventive care reminders to display for this patient.   There are no preventive care reminders to display for this patient.  Lab Results  Component Value Date   TSH 3.48 09/18/2021   Lab Results  Component Value Date   WBC 7.9 09/18/2021   HGB 13.3 09/18/2021   HCT 40.1 09/18/2021   MCV 83.2 09/18/2021   PLT 275 09/18/2021   Lab Results  Component Value Date   NA 140 10/24/2021   K 4.3 10/24/2021   CO2 24 10/24/2021   GLUCOSE 197 (H) 10/24/2021   BUN 16 10/24/2021   CREATININE 0.67 10/24/2021   BILITOT 0.8 10/24/2021   ALKPHOS 78 09/19/2016   AST 9 (L) 10/24/2021   ALT 14 10/24/2021   PROT 6.5 10/24/2021   ALBUMIN 4.3 09/19/2016   CALCIUM 9.6 10/24/2021   Lab Results  Component Value Date   CHOL 142 10/24/2021   Lab Results  Component Value Date   HDL 40 (L) 10/24/2021   Lab Results  Component Value Date   LDLCALC 71 10/24/2021   Lab Results  Component Value Date   TRIG 226 (H) 10/24/2021   Lab Results  Component Value Date   CHOLHDL 3.6 10/24/2021   Lab Results  Component Value Date   HGBA1C 8.5 (A) 11/08/2021      Assessment & Plan:   Problem List Items Addressed This Visit       Cardiovascular and Mediastinum   BP (high blood pressure)    BP looks much better today!!! She has been getting similar BPs at home.  Has f/u with Dr. Stanford Breed coming up and he may start spironolactone.          Endocrine    Diabetes mellitus type 2, controlled ([place]) - Primary    Uncontrolled.  A1C is up from previous.  Her semaglutide that was delivered was actually the 3 mg instead of the 7.5 mg so she is actually on the lower dose and she is supposed to be on.  We will hopefully get this corrected in the new year.  Follow-up in 3 months.  We also discussed setting small goals weekly to make some changes with her diet she admits she has been a little bit more indulgent over the holidays.  We also discussed getting up and just walking during commercial breaks when she is watching TV.  She will actually be starting cardiac rehab soon.  Encouraged her not to skip her insulin completely just because her fasting blood sugars look good.  Just reminded her that it is a long-acting insulin and that she is giving it for the day that is coming more not necessarily actively treating her current blood sugar level.  So reminded her not to use her insulin past a month when she has started using the vial or the pen.      Relevant Orders   POCT glycosylated hemoglobin (Hb A1C) (Completed)    No orders of the defined types were placed in this encounter.   Follow-up: Return in about 3 months (around 02/06/2022) for Diabetes follow-up.    Beatrice Lecher, MD

## 2021-11-08 NOTE — Progress Notes (Signed)
Patient states she is only taking [place] as needed.

## 2021-11-08 NOTE — Progress Notes (Signed)
Chronic Care Management Pharmacy Note  11/08/2021 Name:  Jillian Huynh MRN:  [id number] DOB:  04/11/1946  Summary: addressed HTN, HLD, and primarily DM. She has seen cardiology for newly diagnosed heart failure and recently began goal-directed medical therapy. We reviewed new medications extensively. Patient also states her rybelsus medication supply from novonordisk was 91m. Patient forgot her [id number] application at home.  Glucose values as follows:  Date of Download: 11/08/21 % Time CGM is active: 91% Average Glucose: 194 mg/dL Glucose Management Indicator: 8.0%  Glucose Variability: 19.2% (goal <36%) Time in Goal:  - Time in range 70-180: 41% - Time above range: 59% - Time below range: 0% Observed patterns: 6-9pm still highest glucose range  Date of Download: 10/09/21 % Time CGM is active: 78% Average Glucose: 192 mg/dL Glucose Management Indicator: 7.9%  Glucose Variability: 21.1% (goal <36%) Time in Goal:  - Time in range 70-180: 46% - Time above range: 54% - Time below range: 0% Observed patterns:erratic, eating habits have changed over holiday season, which is to be expected.  Recommendations/Changes made from today's visit:  - Increase to rybelsus 789mdaily with patient assistance medication supply, will complete for 2023 [employer] assistance for both rybelsus & tresiba. - Provided patient assistance application to aid with cost of new entresto - Encouraged patient to check BP at home & write down values for future discussion  Plan: f/u with pharmacist in 1 month  Subjective: Jillian Huynh an [age].o. year old female who is a primary patient of Metheney, CaRene KocherMD.  The CCM team was consulted for assistance with disease management and care coordination needs.    Engaged with patient face to face for follow up visit in response to provider referral for pharmacy case management and/or care coordination services.   Consent to Services:  The  patient was given information about Chronic Care Management services, agreed to services, and gave verbal consent prior to initiation of services.  Please see initial visit note for detailed documentation.   Patient Care Team: MeHali MarryMD as PCP - General (Family Medicine) [facility] Pharmacist (Pharmacist)   Objective:  Lab Results  Component Value Date   CREATININE 0.67 10/24/2021   CREATININE 0.73 09/18/2021   CREATININE 0.59 (L) 12/26/2020    Lab Results  Component Value Date   HGBA1C 8.5 (A) 11/08/2021   Last diabetic Eye exam:  Lab Results  Component Value Date/Time   HMDIABEYEEXA No Retinopathy 08/23/2021 12:00 AM        Component Value Date/Time   CHOL 142 10/24/2021 0851   TRIG 226 (H) 10/24/2021 0851   HDL 40 (L) 10/24/2021 0851   CHOLHDL 3.6 10/24/2021 0851   VLDL 57 (H) 09/19/2016 1057   LDLCALC 71 10/24/2021 0851    Hepatic Function Latest Ref Rng & Units 10/24/2021 12/26/2020 06/20/2020  Total Protein 6.1 - 8.1 g/dL 6.5 6.6 6.7  Albumin 3.6 - 5.1 g/dL - - -  AST 10 - 35 U/L 9(L) 22 17  ALT 6 - 29 U/L 14 34(H) 17  Alk Phosphatase 33 - 130 U/L - - -  Total Bilirubin 0.2 - 1.2 mg/dL 0.8 1.2 0.7  Bilirubin, Direct 0.0 - 0.2 mg/dL 0.1 - -    Lab Results  Component Value Date/Time   TSH 3.48 09/18/2021 03:23 PM   TSH 1.99 07/09/2018 03:43 PM    CBC Latest Ref Rng & Units 09/18/2021 12/26/2020 07/09/2018  WBC 3.8 - 10.8 Thousand/uL  7.9 6.7 8.1  Hemoglobin 11.7 - 15.5 g/dL 13.3 12.7 12.3  Hematocrit 35.0 - 45.0 % 40.1 37.8 36.8  Platelets 140 - 400 Thousand/uL 275 254 285    Lab Results  Component Value Date/Time   VD25OH 26 (L) 03/24/2015 09:08 AM    Clinical ASCVD: Yes  The 10-year ASCVD risk score (Arnett DK, et al., 2019) is: 36.5%   Values used to calculate the score:     Age: 36 years     Sex: Female     Is Non-Hispanic African American: No     Diabetic: Yes     Tobacco smoker: No     Systolic Blood Pressure: 630  mmHg     Is BP treated: Yes     HDL Cholesterol: 40 mg/dL     Total Cholesterol: 142 mg/dL     Social History   Tobacco Use  Smoking Status Never  Smokeless Tobacco Never   BP Readings from Last 3 Encounters:  11/08/21 130/81  11/08/21 (!) 149/78  10/16/21 (!) 145/77   Pulse Readings from Last 3 Encounters:  11/08/21 60  11/08/21 61  10/16/21 72   Wt Readings from Last 3 Encounters:  11/08/21 193 lb 8 oz (87.8 kg)  10/16/21 200 lb 1.9 oz (90.8 kg)  09/18/21 200 lb 6.4 oz (90.9 kg)    Assessment: Review of patient past medical history, allergies, medications, health status, including review of consultants reports, laboratory and other test data, was performed as part of comprehensive evaluation and provision of chronic care management services.   SDOH:  (Social Determinants of Health) assessments and interventions performed:    CCM Care Plan  Allergies  Allergen Reactions   Bydureon [Exenatide] Nausea And Vomiting   Glipizide Other (See Comments)    Leg cramps, fatigue, and weight gain   Trulicity [Dulaglutide] Diarrhea   Invokana [Canagliflozin] Other (See Comments)    Yeast infection   Victoza [Liraglutide] Nausea Only and Other (See Comments)    Constipation,belching,bloating    Medications Reviewed Today     Reviewed by Izora Gala, CMA (Certified Medical Assistant) on 11/08/21 at 1150  Med List Status: <None>   Medication Order Taking? Sig Documenting Provider Last Dose Status Informant  atorvastatin (LIPITOR) 40 MG tablet [id number] Yes Take 1 tablet (40 mg total) by mouth daily. Lelon Perla, MD Taking Active   B-D ULTRAFINE III SHORT PEN 31G X 8 MM MISC [id number] Yes SMARTSIG:1 Each SUB-Q Daily [provider] Taking Active   Calcium Carbonate-Vit D-Min (CALCIUM 1200 PO) [id number] Yes Take by mouth. [provider] Taking Active   carvedilol (COREG) 3.125 MG tablet [id number] Yes Take 1 tablet (3.125 mg total) by mouth 2 (two)  times daily. Lelon Perla, MD Taking Active   Continuous Blood Gluc Sensor (FREESTYLE LIBRE 3 SENSOR) [place] [id number] Yes 1 each by Does not apply route every 14 (fourteen) days. Sensors to be changed every 14 days. DX E11.8 [name], MD Taking Active   ezetimibe (ZETIA) 10 MG tablet [id number] Yes Take 1 tablet (10 mg total) by mouth daily. Lelon Perla, MD Taking Active   FARXIGA 10 MG TABS tablet [id number] Yes TAKE 1 TABLET BY MOUTH EVERY DAY BEFORE BREAKFAST [name], MD Taking Active   glucose blood (ON CALL PLUS BLOOD GLUCOSE) test strip [id number] Yes Dx DM E11.65. Check fasting blood sugar every morning and 2 hours after large meals daily. Check 3 times daily [name], MD Taking  Active   insulin degludec (TRESIBA FLEXTOUCH) 100 UNIT/ML FlexTouch Pen [id number] Yes INJECT 50-70 UNITS INTO THE SKIN AT BEDTIME. [name], MD Taking Active            Med Note Dorene Ar Nov 08, 2021 10:53 AM) Taking 30-40 units daily in AM, as of 11/08/21 review  metFORMIN (GLUCOPHAGE-XR) 500 MG 24 hr tablet [id number] Yes TAKE 2 TABLETS BY MOUTH EVERY DAY WITH BREAKFAST [name], MD Taking Active   pantoprazole (PROTONIX) 40 MG tablet [id number] Yes TAKE 1 TABLET BY MOUTH EVERY DAY [name], MD Taking Active   Probiotic Product (PROBIOTIC PO) [id number] Yes Take by mouth. [provider] Taking Active   rOPINIRole (REQUIP) 3 MG tablet [id number] Yes TAKE 1 TABLET BY MOUTH EVERY DAY [name], MD Taking Active   sacubitril-valsartan [facility]) 24-26 MG [id number] Yes Take 1 tablet by mouth 2 (two) times daily. Lelon Perla, MD Taking Active   Semaglutide 7 MG TABS [id number] Yes Take 1 tablet by mouth. 3 mg [provider] Taking Active   vitamin E 100 UNIT capsule [id number] Yes Take 200 Units by mouth daily. [provider] Taking Active             Patient Active Problem  List   Diagnosis Date Noted   Left bundle branch block (LBBB) determined by electrocardiography 08/08/2021   Gastroesophageal reflux disease without esophagitis 09/19/2020   Severe obesity (BMI 35.0-39.9) with comorbidity ([place]) 05/25/2019   Fatty liver 12/25/2017   BP (high blood pressure) 03/28/2015   Dyslipidemia 03/28/2015   Insomnia 03/28/2015   Restless leg 03/28/2015   Mass of arm 03/14/2015   Diabetes mellitus type 2, controlled ([place]) 03/14/2015   Obstructive apnea 09/03/2012   HYPERKERATOSIS 04/01/2009    Immunization History  Administered Date(s) Administered   Moderna Sars-Covid-2 Vaccination 01/06/2020, 02/12/2020   Pneumococcal Conjugate-13 12/25/2017   Tdap 03/26/2007, 06/29/2015    Conditions to be addressed/monitored: HTN, HLD, and DMII  Care Plan : Medication Management  Updates made by Darius Bump, [place] since 11/08/2021 12:00 AM     Problem: HTN, DM, HLD   Priority: High     Long-Range Goal: Disease Progression Prevention   Recent Progress: On track  Priority: High  Note:   Current Barriers:  Unable to achieve control of diabetes   Pharmacist Clinical Goal(s):  Over the next 30 days, patient will achieve control of diabetes as evidenced by blood glucose values and A1c through collaboration with PharmD and provider.   Interventions: 1:1 collaboration with [name], MD regarding development and update of comprehensive plan of care as evidenced by provider attestation and co-signature Inter-disciplinary care team collaboration (see longitudinal plan of care) Comprehensive medication review performed; medication list updated in electronic medical record  Diabetes:  Uncontrolled; tresiba 35 units daily (self-lowered dosage due to goal of wanting to decrease insulin), farxiga 66m daily, began rybelsus 376mon 07/13/21.  Current glucose readings: [place] CGM [place]  Date of Download: 11/08/21 % Time CGM is active: 91% Average  Glucose: 194 mg/dL Glucose Management Indicator: 8.0%  Glucose Variability: 19.2% (goal <36%) Time in Goal:  - Time in range 70-180: 41% - Time above range: 59% - Time below range: 0% Observed patterns: 6-9pm still highest glucose range  Date of Download: 10/09/21 % Time CGM is active: 78% Average Glucose: 192 mg/dL Glucose Management Indicator: 7.9%  Glucose Variability: 21.1% (goal <36%) Time  in Goal:  - Time in range 70-180: 46% - Time above range: 54% - Time below range: 0% Observed patterns:erratic, eating habits have changed over holiday season, which is to be expected.  Date of Download: 09/25/21 % Time CGM is active: 82% Average Glucose: 166 mg/dL Glucose Management Indicator: 7.3%  Glucose Variability: 16.1 (goal <36%) Time in Goal:  - Time in range 70-180: 78% - Time above range: 22% - Time below range: 0% Observed patterns: Still highest range 7-9pm  Date of Download: 09/04/21 % Time CGM is active: 81% Average Glucose: 166 mg/dL Glucose Management Indicator: 7.3%  Glucose Variability: 16.6% (goal <36%) Time in Goal:  - Time in range 70-180: 73% - Time above range: 27% - Time below range: 0% Observed patterns: BG still highest 8-9pm, but still shows improvement  Date of Download: 07/24/21 % Time CGM is active: 85% Average Glucose: 173 mg/dL Glucose Management Indicator: 7.4  Glucose Variability: 16.9 (goal <36%) Time in Goal:  - Time in range 70-180: 65% - Time above range: 35% - Time below range: 0% Observed patterns: still with elevations 8-10pm, after patient gets off of a long day at work she states she eats late. We are working on this.  Date of Download: 07/12/21 % Time CGM is active: 84% Average Glucose: 197 mg/dL Glucose Management Indicator: 8.0%  Glucose Variability: 24.6 (goal <36%) Time in Goal:  - Time in range 70-180: 45% - Time above range: 55% - Time below range: 0% Observed patterns: Still with highest spikes ~9pm, glucose hits  270-300s.    Denies hyper/hypoglycemic symptoms  Previously discussed current meal patterns:  breakfast: oatmeal, eggs, "stays away from white bread";  lunch: ham & cheese 1/2 sandwhich (w/whole wheat), salad, apple;  dinner: pasta, potatoes, attempts meat/vegetables as much as possible;  snacks: crackers, cheese, sneaks cookie, husband brings sweets into house & patient struggles, describes self as "sugar-holic" ;   Making progress toward goals. Patient also expressed cost constraint with tresiba & farxiga. Patient IS eligible for tresiba & rybelsus patient assistance, but not farxiga. Increase to rybelsus 64m daily using medication supply from patient assistance.   Hypertension:  Controlled; entresto 24-259mBID, coreg 3.12538mID;   Current home readings: not currently checking  Denies hypotensive/hypertensive symptoms  Recommended continue current regimen  Hyperlipidemia:  Uncontrolled; atorvastatin 53m18mezetemibe 10mg52mly;   Assessed medication + current labs  Patient Goals/Self-Care Activities Over the next 30 days, patient will:  Take medications as prescribed and utilize CGM, for data to be discussed at future appointments, check BP and write down for future discussion, complete 2023 patient assistance paperwork & return   Follow Up Plan: Telephone follow up appointment with care management team member scheduled for: 30 days           Medication Assistance:  Rybelsus obtained through nov[employer]isk medication assistance program.  Enrollment ends Nov 11 2021. New application in process for 2023 year to include rybelsus and tresiba.  Also provided application for new entresto.  Patient's preferred pharmacy is:  [id number] [place] [address] [address]: [phone] [place] [address] [address]: [phone] pill box? Yes Pt endorses 100% compliance  Follow Up:  Patient agrees to Care Plan and Follow-up.  Plan: Face to Face appointment with care management team  member scheduled for: 1 month  Larinda Buttery, PharmD Clinical Pharmacist [facility] [phone]

## 2021-11-08 NOTE — Patient Instructions (Signed)
Visit Information  Thank you for taking time to visit with me today. Please don't hesitate to contact me if I can be of assistance to you before our next scheduled telephone appointment.  Following are the goals we discussed today:   Patient Goals/Self-Care Activities Over the next 30 days, patient will:  Take medications as prescribed and utilize CGM, for data to be discussed at future appointments, check BP and write down for future discussion, complete 2023 patient assistance paperwork & return   Follow Up Plan: Telephone follow up appointment with care management team member scheduled for: 30 days  Please call the care guide team at [phone] if you need to cancel or reschedule your appointment.    Patient verbalizes understanding of instructions provided today and agrees to view in [place].   Darius Bump

## 2021-11-08 NOTE — Assessment & Plan Note (Signed)
BP looks much better today!!! She has been getting similar BPs at home.  Has f/u with Dr. Stanford Breed coming up and he may start spironolactone.

## 2021-11-11 DIAGNOSIS — Z794 Long term (current) use of insulin: Secondary | ICD-10-CM | POA: Diagnosis not present

## 2021-11-11 DIAGNOSIS — E1169 Type 2 diabetes mellitus with other specified complication: Secondary | ICD-10-CM | POA: Diagnosis not present

## 2021-11-11 DIAGNOSIS — E785 Hyperlipidemia, unspecified: Secondary | ICD-10-CM

## 2021-11-11 DIAGNOSIS — I10 Essential (primary) hypertension: Secondary | ICD-10-CM | POA: Diagnosis not present

## 2021-12-04 ENCOUNTER — Telehealth: Payer: Self-pay | Admitting: Family Medicine

## 2021-12-04 ENCOUNTER — Other Ambulatory Visit: Payer: Self-pay | Admitting: Family Medicine

## 2021-12-04 ENCOUNTER — Other Ambulatory Visit: Payer: Self-pay

## 2021-12-04 ENCOUNTER — Ambulatory Visit (INDEPENDENT_AMBULATORY_CARE_PROVIDER_SITE_OTHER): Payer: Medicare Other | Admitting: Pharmacist

## 2021-12-04 DIAGNOSIS — E1169 Type 2 diabetes mellitus with other specified complication: Secondary | ICD-10-CM

## 2021-12-04 DIAGNOSIS — E118 Type 2 diabetes mellitus with unspecified complications: Secondary | ICD-10-CM

## 2021-12-04 DIAGNOSIS — E785 Hyperlipidemia, unspecified: Secondary | ICD-10-CM

## 2021-12-04 DIAGNOSIS — I10 Essential (primary) hypertension: Secondary | ICD-10-CM

## 2021-12-04 NOTE — Patient Instructions (Signed)
Visit Information  Thank you for taking time to visit with me today. Please don't hesitate to contact me if I can be of assistance to you before our next scheduled telephone appointment.  Following are the goals we discussed today:  Patient Goals/Self-Care Activities Over the next 30 days, patient will:  Take medications as prescribed and utilize CGM, for data to be discussed at future appointments, check BP and write down for future discussion, increase tresiba to 45 units daily until we can obtain rybelsus 7mg  daily from patient assistance   Follow Up Plan: Telephone follow up appointment with care management team member scheduled for: 30 days  Please call the care guide team at [phone] if you need to cancel or reschedule your appointment.   Patient verbalizes understanding of instructions and care plan provided today and agrees to view in [place]. Active MyChart status confirmed with patient.    Jillian Huynh

## 2021-12-04 NOTE — Telephone Encounter (Signed)
Patient came by and signed paperwork that was left in accordion for her to sign at the highlighted areas. Placed paperwork in your box. AM

## 2021-12-04 NOTE — Progress Notes (Signed)
Chronic Care Management Pharmacy Note  12/04/2021 Name:  Jillian Huynh MRN:  [id number] DOB:  Nov 16, 1945  Summary: addressed HTN, HLD, and primarily DM. Arranged for her to come back and fill out some missing signatures on her 2023 rybelsus patient assistance application.  Currently using tresiba 35-40 units daily.  Glucose per CGM:  Date of Download: 12/04/21 % Time CGM is active: 96% Average Glucose: 204 mg/dL Glucose Management Indicator: 8.2%  Glucose Variability: 19.5% (goal <36%) Time in Goal:  - Time in range 70-180: 31% - Time above range: 69% - Time below range: 0% Observed patterns:   Date of Download: 11/08/21 % Time CGM is active: 91% Average Glucose: 194 mg/dL Glucose Management Indicator: 8.0%  Glucose Variability: 19.2% (goal <36%) Time in Goal:  - Time in range 70-180: 41% - Time above range: 59% - Time below range: 0% Observed patterns: 6-9pm still highest glucose range  Recommendations/Changes made from today's visit:  - Increase tresiba to 45 units daily until rybelsus dose is increased using patient assistance supply. - Increase to rybelsus 24m daily with patient assistance medication supply, will complete for 2023 novonordisk assistance for both rybelsus & tresiba. - Encouraged patient to check BP at home & write down values for future discussion  Plan: f/u with pharmacist in 1 month  Subjective: Jillian Neaceis an 76y.o. year old female who is a primary patient of Met[place], CRene Kocher MD.  The CCM team was consulted for assistance with disease management and care coordination needs.    Engaged with patient by telephone for follow up visit in response to provider referral for pharmacy case management and/or care coordination services.   Consent to Services:  The patient was given information about Chronic Care Management services, agreed to services, and gave verbal consent prior to initiation of services.  Please see initial visit note for  detailed documentation.   Patient Care Team: MHali Marry MD as PCP - General (Family Medicine) KDarius Bump [facility]Stra[facility] Surgeryas Pharmacist (Pharmacist)   Objective:  Lab Results  Component Value Date   CREATININE 0.67 10/24/2021   CREATININE 0.73 09/18/2021   CREATININE 0.59 (L) 12/26/2020    Lab Results  Component Value Date   HGBA1C 8.5 (A) 11/08/2021   Last diabetic Eye exam:  Lab Results  Component Value Date/Time   HMDIABEYEEXA No Retinopathy 08/23/2021 12:00 AM        Component Value Date/Time   CHOL 142 10/24/2021 0851   TRIG 226 (H) 10/24/2021 0851   HDL 40 (L) 10/24/2021 0851   CHOLHDL 3.6 10/24/2021 0851   VLDL 57 (H) 09/19/2016 1057   LDLCALC 71 10/24/2021 0851    Hepatic Function Latest Ref Rng & Units 10/24/2021 12/26/2020 06/20/2020  Total Protein 6.1 - 8.1 g/dL 6.5 6.6 6.7  Albumin 3.6 - 5.1 g/dL - - -  AST 10 - 35 U/L 9(L) 22 17  ALT 6 - 29 U/L 14 34(H) 17  Alk Phosphatase 33 - 130 U/L - - -  Total Bilirubin 0.2 - 1.2 mg/dL 0.8 1.2 0.7  Bilirubin, Direct 0.0 - 0.2 mg/dL 0.1 - -    Lab Results  Component Value Date/Time   TSH 3.48 09/18/2021 03:23 PM   TSH 1.99 07/09/2018 03:43 PM    CBC Latest Ref Rng & Units 09/18/2021 12/26/2020 07/09/2018  WBC 3.8 - 10.8 Thousand/uL 7.9 6.7 8.1  Hemoglobin 11.7 - 15.5 g/dL 13.3 12.7 12.3  Hematocrit 35.0 - 45.0 % 40.1 37.8 36.8  Platelets 140 - 400 Thousand/uL 275 254 285    Lab Results  Component Value Date/Time   VD25OH 26 (L) 03/24/2015 09:08 AM    Clinical ASCVD: Yes  The 10-year ASCVD risk score (Arnett DK, et al., 2019) is: 36.5%   Values used to calculate the score:     Age: 13 years     Sex: Female     Is Non-Hispanic African American: No     Diabetic: Yes     Tobacco smoker: No     Systolic Blood Pressure: 767 mmHg     Is BP treated: Yes     HDL Cholesterol: 40 mg/dL     Total Cholesterol: 142 mg/dL     Social History   Tobacco Use  Smoking Status Never  Smokeless Tobacco Never    BP Readings from Last 3 Encounters:  11/08/21 130/81  11/08/21 (!) 149/78  10/16/21 (!) 145/77   Pulse Readings from Last 3 Encounters:  11/08/21 60  11/08/21 61  10/16/21 72   Wt Readings from Last 3 Encounters:  11/08/21 193 lb 8 oz (87.8 kg)  10/16/21 200 lb 1.9 oz (90.8 kg)  09/18/21 200 lb 6.4 oz (90.9 kg)    Assessment: Review of patient past medical history, allergies, medications, health status, including review of consultants reports, laboratory and other test data, was performed as part of comprehensive evaluation and provision of chronic care management services.   SDOH:  (Social Determinants of Health) assessments and interventions performed:    CCM Care Plan  Allergies  Allergen Reactions   Bydureon [Exenatide] Nausea And Vomiting   Glipizide Other (See Comments)    Leg cramps, fatigue, and weight gain   Trulicity [Dulaglutide] Diarrhea   Invokana [Canagliflozin] Other (See Comments)    Yeast infection   Victoza [Liraglutide] Nausea Only and Other (See Comments)    Constipation,belching,bloating    Medications Reviewed Today     Reviewed by Izora Gala, CMA (Certified Medical Assistant) on 11/08/21 at 1150  Med List Status: <None>   Medication Order Taking? Sig Documenting Provider Last Dose Status Informant  atorvastatin (LIPITOR) 40 MG tablet [id number] Yes Take 1 tablet (40 mg total) by mouth daily. Lelon Perla, MD Taking Active   B-D ULTRAFINE III SHORT PEN 31G X 8 MM MISC [id number] Yes SMARTSIG:1 Each SUB-Q Daily [provider] Taking Active   Calcium Carbonate-Vit D-Min (CALCIUM 1200 PO) [id number] Yes Take by mouth. [provider] Taking Active   carvedilol (COREG) 3.125 MG tablet [id number] Yes Take 1 tablet (3.125 mg total) by mouth 2 (two) times daily. Lelon Perla, MD Taking Active   Continuous Blood Gluc Sensor (FREESTYLE LIBRE 3 SENSOR) [place] [id number] Yes 1 each by Does not apply route every 14 (fourteen) days.  Sensors to be changed every 14 days. DX E11.8 [name], MD Taking Active   ezetimibe (ZETIA) 10 MG tablet [id number] Yes Take 1 tablet (10 mg total) by mouth daily. Lelon Perla, MD Taking Active   FARXIGA 10 MG TABS tablet [id number] Yes TAKE 1 TABLET BY MOUTH EVERY DAY BEFORE BREAKFAST [name], MD Taking Active   glucose blood (ON CALL PLUS BLOOD GLUCOSE) test strip [id number] Yes Dx DM E11.65. Check fasting blood sugar every morning and 2 hours after large meals daily. Check 3 times daily [name], MD Taking Active   insulin degludec (TRESIBA FLEXTOUCH) 100 UNIT/ML FlexTouch Pen [id number] Yes INJECT 50-70 UNITS INTO THE SKIN AT BEDTIME. [place],  Rene Kocher, MD Taking Active            Med Note Dorene Ar Nov 08, 2021 10:53 AM) Taking 30-40 units daily in AM, as of 11/08/21 review  metFORMIN (GLUCOPHAGE-XR) 500 MG 24 hr tablet [id number] Yes TAKE 2 TABLETS BY MOUTH EVERY DAY WITH BREAKFAST [name], MD Taking Active   pantoprazole (PROTONIX) 40 MG tablet [id number] Yes TAKE 1 TABLET BY MOUTH EVERY DAY [name], MD Taking Active   Probiotic Product (PROBIOTIC PO) [id number] Yes Take by mouth. [provider] Taking Active   rOPINIRole (REQUIP) 3 MG tablet [id number] Yes TAKE 1 TABLET BY MOUTH EVERY DAY [name], MD Taking Active   sacubitril-valsartan [facility]) 24-26 MG [id number] Yes Take 1 tablet by mouth 2 (two) times daily. Lelon Perla, MD Taking Active   Semaglutide 7 MG TABS [id number] Yes Take 1 tablet by mouth. 3 mg [provider] Taking Active   vitamin E 100 UNIT capsule [id number] Yes Take 200 Units by mouth daily. [provider] Taking Active             Patient Active Problem List   Diagnosis Date Noted   Left bundle branch block (LBBB) determined by electrocardiography 08/08/2021   Gastroesophageal reflux disease without esophagitis 09/19/2020   Severe  obesity (BMI 35.0-39.9) with comorbidity ([place]) 05/25/2019   Fatty liver 12/25/2017   BP (high blood pressure) 03/28/2015   Dyslipidemia 03/28/2015   Insomnia 03/28/2015   Restless leg 03/28/2015   Mass of arm 03/14/2015   Diabetes mellitus type 2, controlled ([place]) 03/14/2015   Obstructive apnea 09/03/2012   HYPERKERATOSIS 04/01/2009    Immunization History  Administered Date(s) Administered   Moderna Sars-Covid-2 Vaccination 01/06/2020, 02/12/2020   Pneumococcal Conjugate-13 12/25/2017   Tdap 03/26/2007, 06/29/2015    Conditions to be addressed/monitored: HTN, HLD, and DMII  There are no care plans that you recently modified to display for this patient.          Medication Assistance:  Rybelsus obtained through novonordisk medication assistance program.  Enrollment ends Nov 11 2021. New application in process for 2023 year to include rybelsus and tresiba.  Also provided application for new entresto.  Patient's preferred pharmacy is:  [id number] [place], [id number]: [phone]: [phone] [address] [id number]: [phone]: [phone]  Uses pill box? Yes Pt endorses 100% compliance  Follow Up:  Patient agrees to Care Plan and Follow-up.  Plan: Face to Face appointment with care management team member scheduled for: 1 month  KLarinda Buttery PharmD Clinical Pharmacist [phone]

## 2021-12-05 ENCOUNTER — Encounter (HOSPITAL_COMMUNITY): Payer: Medicare HMO | Admitting: Cardiology

## 2021-12-11 ENCOUNTER — Other Ambulatory Visit: Payer: Self-pay

## 2021-12-11 ENCOUNTER — Encounter (HOSPITAL_COMMUNITY): Payer: Self-pay | Admitting: Cardiology

## 2021-12-11 ENCOUNTER — Ambulatory Visit (HOSPITAL_COMMUNITY)
Admission: RE | Admit: 2021-12-11 | Discharge: 2021-12-11 | Disposition: A | Discharge status: Home / Self Care | Payer: Medicare Other | Source: Ambulatory Visit | Attending: Cardiology | Admitting: Cardiology

## 2021-12-11 VITALS — Wt 197.4 lb

## 2021-12-11 DIAGNOSIS — E7800 Pure hypercholesterolemia, unspecified: Secondary | ICD-10-CM | POA: Diagnosis not present

## 2021-12-11 DIAGNOSIS — I5022 Chronic systolic (congestive) heart failure: Secondary | ICD-10-CM

## 2021-12-11 DIAGNOSIS — Z8249 Family history of ischemic heart disease and other diseases of the circulatory system: Secondary | ICD-10-CM | POA: Diagnosis not present

## 2021-12-11 DIAGNOSIS — I428 Other cardiomyopathies: Secondary | ICD-10-CM | POA: Insufficient documentation

## 2021-12-11 DIAGNOSIS — I429 Cardiomyopathy, unspecified: Secondary | ICD-10-CM

## 2021-12-11 DIAGNOSIS — Z7984 Long term (current) use of oral hypoglycemic drugs: Secondary | ICD-10-CM | POA: Insufficient documentation

## 2021-12-11 DIAGNOSIS — Z79899 Other long term (current) drug therapy: Secondary | ICD-10-CM | POA: Insufficient documentation

## 2021-12-11 DIAGNOSIS — I447 Left bundle-branch block, unspecified: Secondary | ICD-10-CM | POA: Diagnosis not present

## 2021-12-11 DIAGNOSIS — F40240 Claustrophobia: Secondary | ICD-10-CM | POA: Insufficient documentation

## 2021-12-11 DIAGNOSIS — I2510 Atherosclerotic heart disease of native coronary artery without angina pectoris: Secondary | ICD-10-CM | POA: Insufficient documentation

## 2021-12-11 DIAGNOSIS — G4733 Obstructive sleep apnea (adult) (pediatric): Secondary | ICD-10-CM | POA: Insufficient documentation

## 2021-12-11 DIAGNOSIS — I110 Hypertensive heart disease with heart failure: Secondary | ICD-10-CM | POA: Insufficient documentation

## 2021-12-11 LAB — BASIC METABOLIC PANEL
Anion gap: 11 (ref 5–15)
BUN: 16 mg/dL (ref 8–23)
CO2: 22 mmol/L (ref 22–32)
Calcium: 10 mg/dL (ref 8.9–10.3)
Chloride: 105 mmol/L (ref 98–111)
Creatinine, Ser: 0.65 mg/dL (ref 0.44–1.00)
GFR, Estimated: >60 mL/min (ref >60)
Glucose, Bld: 252 mg/dL — ABNORMAL HIGH (ref 70–99)
Potassium: 4 mmol/L (ref 3.5–5.1)
Sodium: 138 mmol/L (ref 135–145)

## 2021-12-11 LAB — LIPID PANEL
Cholesterol: 99 mg/dL (ref 0–200)
HDL: 40 mg/dL — ABNORMAL LOW (ref >40)
LDL Cholesterol: 34 mg/dL (ref 0–99)
Total CHOL/HDL Ratio: 2.5 RATIO
Triglycerides: 127 mg/dL (ref <150)
VLDL: 25 mg/dL (ref 0–40)

## 2021-12-11 LAB — BRAIN NATRIURETIC PEPTIDE: B Natriuretic Peptide: 200.4 pg/mL — ABNORMAL HIGH (ref 0.0–100.0)

## 2021-12-11 MED ORDER — SPIRONOLACTONE 25 MG PO TABS: 12.5000 mg | ORAL_TABLET | Freq: Every day | ORAL | 3 refills | Status: DC

## 2021-12-11 MED ORDER — ENTRESTO 49-51 MG PO TABS: 1.0000 | ORAL_TABLET | Freq: Two times a day (BID) | ORAL | 11 refills | Status: DC

## 2021-12-11 NOTE — Progress Notes (Signed)
PCP: [name], MD Cardiology: Dr. Stanford Breed HF Cardiology: Dr. Aundra Dubin  76 y.o. with history of LBBB, HTN, and nonischemic cardiomyopathy was referred by Dr. Stanford Breed for evaluation of CHF.  Patient reports exertional dyspnea now for a couple of years.  She was evaluated by her PCP and referred to Dr. Stanford Breed in 11/22 for dyspnea and a LBBB noted by ECG. Echo in 11/22 showed EF in the 30% range by my review with normal RV, mild AI, mild MR, and IVC normal.  Coronary CTA to follow this showed mild nonobstructive CAD.  She has no history of ETOH or drugs.  She has no family history of cardiomyopathy that she knows of.  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    Patient reports easy fatigability.  She is sleepy during the day.  She is short of breath with stairs and walking up an incline.  Generally doing better with less dyspnea since starting on cardiac meds.  Weight has been trending slowly down.  SBP generally in 140s at home. No lightheadedness, palpitations, or syncope.  No orthopnea/PND.  No chest pain.   ECG (personally reviewed): NSR, LBBB 152 msec  Labs (12/22): K 4.3, creatinine 0.67, LDL 71, TGs 226  PMH: 1. HTN 2. Hyperlipidemia 3. OSA: Remote sleep study, did not use CPAP.  4. Chronic systolic CHF: Nonischemic cardiomyopathy.  - Echo (11/22): On my review, EF 30%, normal RV size and systolic function, mild MR, mild AI, IVC normal.  - No h/o ETOH.  - Coronary CTA: 162 Agatston units (72nd percentile), minimal LAD plaque.  5. Chronic LBBB 6. ABIs (10/22): Normal.   Social History   Socioeconomic History   Marital status: Married    Spouse name: Biochemist, clinical   Number of children: 3   Years of education: 16.5   Highest education level: Some college, no degree  Occupational History   Occupation: Hair Stylist  Tobacco Use   Smoking status: Never   Smokeless tobacco: Never  Substance and Sexual Activity   Alcohol use: No    Alcohol/week: 0.0  standard drinks   Drug use: No   Sexual activity: Yes    Partners: Male  Other Topics Concern   Not on file  Social History Narrative   Lives with her husband. She is working full time as a Probation officer. Two of her children live close by. One is in [place]. One of her daughter's is deceased. Her hobbies include watch tv, listening to music and reading.    Social Determinants of Health   Financial Resource Strain: Low Risk    Difficulty of Paying Living Expenses: Not hard at all  Food Insecurity: No Food Insecurity   Worried About Charity fundraiser in the Last Year: Never true   [place] in the Last Year: Never true  Transportation Needs: No Transportation Needs   Lack of Transportation (Medical): No   Lack of Transportation (Non-Medical): No  Physical Activity: Inactive   Days of Exercise per Week: 0 days   Minutes of Exercise per Session: 0 min  Stress: No Stress Concern Present   Feeling of Stress : Not at all  Social Connections: Moderately Integrated   Frequency of Communication with Friends and Family: More than three times a week   Frequency of Social Gatherings with Friends and Family: More than three times a week   Attends Religious Services: Never   Marine scientist or Organizations: Yes   Attends  Club or Organization Meetings: 1 to 4 times per year   Marital Status: Married  Human resources officer Violence: Not At Risk   Fear of Current or Ex-Partner: No   Emotionally Abused: No   Physically Abused: No   Sexually Abused: No   Family History  Problem Relation Age of Onset   Diabetes Father    Cancer Father 58       Esophogeal Ca., Lung.   Hypertension Other    Thyroid disease Other    Colon cancer Daughter 84   Glaucoma Mother    Hepatitis C Mother 38       Died from.   Glaucoma Sister    Glaucoma Sister    Glaucoma Maternal Aunt    ROS: All systems reviewed and negative except as per HPI.   Current Outpatient Medications  Medication Sig  Dispense Refill   atorvastatin (LIPITOR) 40 MG tablet Take 1 tablet (40 mg total) by mouth daily. 90 tablet 3   B-D ULTRAFINE III SHORT PEN 31G X 8 MM MISC SMARTSIG:1 Each SUB-Q Daily     Calcium Carbonate-Vit D-Min (CALCIUM 1200 PO) Take by mouth.     carvedilol (COREG) 3.125 MG tablet Take 1 tablet (3.125 mg total) by mouth 2 (two) times daily. 180 tablet 3   Continuous Blood Gluc Sensor (FREESTYLE LIBRE 3 SENSOR) MISC 1 each by Does not apply route every 14 (fourteen) days. Sensors to be changed every 14 days. DX E11.8 2 each 5   dapagliflozin propanediol (FARXIGA) 10 MG TABS tablet TAKE 1 TABLET BY MOUTH EVERY DAY BEFORE BREAKFAST 90 tablet 0   ezetimibe (ZETIA) 10 MG tablet Take 1 tablet (10 mg total) by mouth daily. 90 tablet 3   glucose blood (ON CALL PLUS BLOOD GLUCOSE) test strip Dx DM E11.65. Check fasting blood sugar every morning and 2 hours after large meals daily. Check 3 times daily 300 each prn   Insulin Degludec (TRESIBA [place]) Inject 30 Units into the skin at bedtime. 30- 40 units     metFORMIN (GLUCOPHAGE-XR) 500 MG 24 hr tablet TAKE 2 TABLETS BY MOUTH EVERY DAY WITH BREAKFAST 180 tablet 1   pantoprazole (PROTONIX) 40 MG tablet TAKE 1 TABLET BY MOUTH EVERY DAY 90 tablet 1   Probiotic Product (PROBIOTIC PO) Take by mouth.     rOPINIRole (REQUIP) 3 MG tablet TAKE 1 TABLET BY MOUTH EVERY DAY 90 tablet 1   sacubitril-valsartan (ENTRESTO) 49-51 MG Take 1 tablet by mouth 2 (two) times daily. 60 tablet 11   Semaglutide 7 MG TABS Take 1 tablet by mouth. 3 mg     spironolactone (ALDACTONE) 25 MG tablet Take 0.5 tablets (12.5 mg total) by mouth daily. 45 tablet 3   vitamin E 100 UNIT capsule Take 200 Units by mouth daily.     No current facility-administered medications for this encounter.   Wt 89.5 kg (197 lb 6.4 oz)    BMI 36.10 kg/m  General: NAD Neck: No JVD, no thyromegaly or thyroid nodule.  Lungs: Clear to auscultation bilaterally with normal respiratory effort. CV:  Nondisplaced PMI.  Heart regular S1/S2, no S3/S4, no murmur.  No peripheral edema.  No carotid bruit.  Normal pedal pulses.  Abdomen: Soft, nontender, no hepatosplenomegaly, no distention.  Skin: Intact without lesions or rashes.  Neurologic: Alert and oriented x 3.  Psych: Normal affect. Extremities: No clubbing or cyanosis.  HEENT: Normal.   Assessment/Plan: 1. Chronic systolic CHF: Nonischemic cardiomyopathy.  Coronary CTA in 11/22 with mild  nonobstructive CAD.  Echo in 11/22 showed on my review EF 30%, normal RV size and systolic function, mild MR, mild AI, IVC normal. No ETOH.  No FH of cardiomyopathy.  No arrhythmias have been noted.  She may have a LBBB cardiomyopathy.  NYHA class II symptoms.  She is not volume overloaded on exam.  - Continue Coreg 3.125 mg bid.  - Continue dapagliflozin 10 mg daily.  - Add spironolactone 12.5 mg daily, BMET today and in 10 days.  - Increase Entresto to 49/51 bid.   - Repeat echo in 5/22, if EF remains low will need to consider CRT-D.  - She does not think she could tolerate a cardiac MRI due to claustrophobia.  2. HTN: Medication changes as above.  3. OSA: Strongly suspect OSA.  I will have her do a home sleep study.   Followup with HF pharmacist in 3 wks x 2 visits for med titration.  See me in 2 months.   Loralie Champagne 12/11/2021

## 2021-12-11 NOTE — Patient Instructions (Addendum)
EKG done today.  Labs done today. We will contact you only if your labs are abnormal.  START Spironolactone 12.5mg  (1/2 tablet) by mouth daily.   INCREASE Entresto to 49-51mg  (1 tablet) by mouth 2 times daily.   No other medication changes were made. Please continue all current medications as prescribed.  Your provider has recommended that you have a home sleep study.  We have provided you with the equipment in our office today. Please download the app and follow the instructions. YOUR PIN NUMBER IS: [id number]. Once you have completed the test you just dispose of the equipment, the information is automatically uploaded to [place] via blue-tooth technology. If your test is positive for sleep apnea and you need a home CPAP machine you will be contacted by Dr Theodosia Blender office [facility]) to set this up.  Your physician recommends that you schedule a follow-up appointment in: 10 days for a lab appointment, 3 weeks with our Clinic Pharmacist and in 2 months with Dr. Aundra Dubin.   If you have any questions or concerns before your next appointment please send [place] a message through [place] or call our office at [phone].    TO LEAVE A MESSAGE FOR THE NURSE SELECT OPTION 2, PLEASE LEAVE A MESSAGE INCLUDING: YOUR NAME DATE OF BIRTH CALL BACK NUMBER REASON FOR CALL**this is important as we prioritize the call backs  YOU WILL RECEIVE A CALL BACK THE SAME DAY AS LONG AS YOU CALL BEFORE 4:00 PM   Do the following things EVERYDAY: Weigh yourself in the morning before breakfast. Write it down and keep it in a log. Take your medicines as prescribed Eat low salt foods--Limit salt (sodium) to 2000 mg per day.  Stay as active as you can everyday Limit all fluids for the day to less than 2 liters   At the [facility], you and your health needs are our priority. As part of our continuing mission to provide you with exceptional heart care, we have created designated Provider Care Teams. These  Care Teams include your primary Cardiologist (physician) and Advanced Practice Providers (APPs- Physician Assistants and Nurse Practitioners) who all work together to provide you with the care you need, when you need it.   You may see any of the following providers on your designated Care Team at your next follow up: Dr Glori Bickers Dr Haynes Kerns, NP Lyda Jester, [place] Audry Riles, PharmD   Please be sure to bring in all your medications bottles to every appointment.

## 2021-12-12 DIAGNOSIS — E785 Hyperlipidemia, unspecified: Secondary | ICD-10-CM

## 2021-12-12 DIAGNOSIS — I10 Essential (primary) hypertension: Secondary | ICD-10-CM

## 2021-12-12 DIAGNOSIS — E1169 Type 2 diabetes mellitus with other specified complication: Secondary | ICD-10-CM

## 2021-12-12 DIAGNOSIS — Z794 Long term (current) use of insulin: Secondary | ICD-10-CM

## 2021-12-14 ENCOUNTER — Telehealth (HOSPITAL_COMMUNITY): Payer: Self-pay | Admitting: Surgery

## 2021-12-14 NOTE — Telephone Encounter (Signed)
° °  Date:  12/14/2021 STOP BANG RISK ASSESSMENT S (snore) Have you been told that you snore?     YES   T (tired) Are you often tired, fatigued, or sleepy during the day?   YES  O (obstruction) Do you stop breathing, choke, or gasp during sleep? NO   P (pressure) Do you have or are you being treated for high blood pressure? YES   B (BMI) Is your body index greater than 35 kg/m? YES   A (age) Are you 76 years old or older? YES   N (neck) Do you have a neck circumference greater than 16 inches?   NO   G (gender) Are you a female? NO   TOTAL STOP/BANG YES ANSWERS 5

## 2021-12-15 ENCOUNTER — Telehealth (HOSPITAL_COMMUNITY): Payer: Self-pay | Admitting: *Deleted

## 2021-12-15 ENCOUNTER — Telehealth (HOSPITAL_COMMUNITY): Payer: Self-pay | Admitting: Surgery

## 2021-12-15 NOTE — Telephone Encounter (Signed)
Patient called in reference to ordered home sleep study.  Per [place] insurance does not require precert and patient was informed to proceed with the study.  She denies any questions and was grateful for the call.

## 2021-12-25 ENCOUNTER — Other Ambulatory Visit (HOSPITAL_COMMUNITY): Payer: Medicare Other

## 2021-12-25 DIAGNOSIS — E7800 Pure hypercholesterolemia, unspecified: Secondary | ICD-10-CM | POA: Diagnosis not present

## 2021-12-26 LAB — LIPID PANEL
Cholesterol: 110 mg/dL (ref <200)
HDL: 40 mg/dL — ABNORMAL LOW (ref >=50)
LDL Cholesterol (Calc): 44 mg/dL (calc)
Non-HDL Cholesterol (Calc): 70 mg/dL (calc) (ref <130)
Total CHOL/HDL Ratio: 2.8 (calc) (ref <5.0)
Triglycerides: 188 mg/dL — ABNORMAL HIGH (ref <150)

## 2021-12-26 LAB — HEPATIC FUNCTION PANEL
AG Ratio: 1.9 (calc) (ref 1.0–2.5)
ALT: 11 U/L (ref 6–29)
AST: 12 U/L (ref 10–35)
Albumin: 4.4 g/dL (ref 3.6–5.1)
Alkaline phosphatase (APISO): 62 U/L (ref 37–153)
Bilirubin, Direct: 0.1 mg/dL (ref 0.0–0.2)
Globulin: 2.3 g/dL (calc) (ref 1.9–3.7)
Indirect Bilirubin: 0.8 mg/dL (calc) (ref 0.2–1.2)
Total Bilirubin: 0.9 mg/dL (ref 0.2–1.2)
Total Protein: 6.7 g/dL (ref 6.1–8.1)

## 2021-12-27 ENCOUNTER — Other Ambulatory Visit: Payer: Self-pay

## 2021-12-27 ENCOUNTER — Ambulatory Visit (INDEPENDENT_AMBULATORY_CARE_PROVIDER_SITE_OTHER): Payer: Medicare Other | Admitting: Pharmacist

## 2021-12-27 DIAGNOSIS — E785 Hyperlipidemia, unspecified: Secondary | ICD-10-CM

## 2021-12-27 DIAGNOSIS — I10 Essential (primary) hypertension: Secondary | ICD-10-CM

## 2021-12-27 DIAGNOSIS — E118 Type 2 diabetes mellitus with unspecified complications: Secondary | ICD-10-CM

## 2021-12-27 DIAGNOSIS — E1169 Type 2 diabetes mellitus with other specified complication: Secondary | ICD-10-CM

## 2021-12-27 NOTE — Progress Notes (Signed)
Chronic Care Management Pharmacy Note  12/28/2021 Name:  Jillian Huynh MRN:  [id number] DOB:  June 01, 1946  Summary: addressed HTN, HLD, and primarily DM. Rybelsus and tresiba patient assistance sent, awaiting update from company.  Currently using tresiba 45 units daily.  Glucose per CGM:  Date of Download: 12/27/21 % Time CGM is active: 97% Average Glucose: 197 mg/dL Glucose Management Indicator: 8%  Glucose Variability: 21.4% (goal <36%) Time in Goal:  - Time in range 70-180: 45% - Time above range: 55% - Time below range: 0% Observed patterns: dinner still the time of highest readings   Date of Download: 12/04/21 % Time CGM is active: 96% Average Glucose: 204 mg/dL Glucose Management Indicator: 8.2%  Glucose Variability: 19.5% (goal <36%) Time in Goal:  - Time in range 70-180: 31% - Time above range: 69% - Time below range: 0% Observed patterns: evening meal of highest glucose readings   Recommendations/Changes made from today's visit:   - Increase to rybelsus 37m daily with patient assistance medication supply, will complete for 2023 novonordisk assistance for both rybelsus & tresiba. - Encouraged patient to check BP at home & write down values for future discussion  Plan: f/u with pharmacist in 3-5 months  Subjective: Jillian Huynh an 76y.o. year old female who is a primary patient of [place], CRene Kocher MD.  The CCM team was consulted for assistance with disease management and care coordination needs.    Engaged with patient by telephone for follow up visit in response to provider referral for pharmacy case management and/or care coordination services.   Consent to Services:  The patient was given information about Chronic Care Management services, agreed to services, and gave verbal consent prior to initiation of services.  Please see initial visit note for detailed documentation.   Patient Care Team: MHali Marry MD as PCP - General (Family  Medicine) KDarius Bump [facility]as Pharmacist (Pharmacist)   Objective:  Lab Results  Component Value Date   CREATININE 0.65 12/11/2021   CREATININE 0.67 10/24/2021   CREATININE 0.73 09/18/2021    Lab Results  Component Value Date   HGBA1C 8.5 (A) 11/08/2021   Last diabetic Eye exam:  Lab Results  Component Value Date/Time   HMDIABEYEEXA No Retinopathy 08/23/2021 12:00 AM        Component Value Date/Time   CHOL 110 12/25/2021 1104   TRIG 188 (H) 12/25/2021 1104   HDL 40 (L) 12/25/2021 1104   CHOLHDL 2.8 12/25/2021 1104   VLDL 25 12/11/2021 1244   LDLCALC 44 12/25/2021 1104    Hepatic Function Latest Ref Rng & Units 12/25/2021 10/24/2021 12/26/2020  Total Protein 6.1 - 8.1 g/dL 6.7 6.5 6.6  Albumin 3.6 - 5.1 g/dL - - -  AST 10 - 35 U/L 12 9(L) 22  ALT 6 - 29 U/L 11 14 34(H)  Alk Phosphatase 33 - 130 U/L - - -  Total Bilirubin 0.2 - 1.2 mg/dL 0.9 0.8 1.2  Bilirubin, Direct 0.0 - 0.2 mg/dL 0.1 0.1 -    Lab Results  Component Value Date/Time   TSH 3.48 09/18/2021 03:23 PM   TSH 1.99 07/09/2018 03:43 PM    CBC Latest Ref Rng & Units 09/18/2021 12/26/2020 07/09/2018  WBC 3.8 - 10.8 Thousand/uL 7.9 6.7 8.1  Hemoglobin 11.7 - 15.5 g/dL 13.3 12.7 12.3  Hematocrit 35.0 - 45.0 % 40.1 37.8 36.8  Platelets 140 - 400 Thousand/uL 275 254 285    Lab Results  Component Value Date/Time  VD25OH 26 (L) 03/24/2015 09:08 AM    Clinical ASCVD: Yes  The ASCVD Risk score (Arnett DK, et al., 2019) failed to calculate for the following reasons:   The valid total cholesterol range is 130 to 320 mg/dL     Social History   Tobacco Use  Smoking Status Never  Smokeless Tobacco Never   BP Readings from Last 3 Encounters:  11/08/21 130/81  11/08/21 (!) 149/78  10/16/21 (!) 145/77   Pulse Readings from Last 3 Encounters:  11/08/21 60  11/08/21 61  10/16/21 72   Wt Readings from Last 3 Encounters:  12/11/21 197 lb 6.4 oz (89.5 kg)  11/08/21 193 lb 8 oz (87.8 kg)  10/16/21  200 lb 1.9 oz (90.8 kg)    Assessment: Review of patient past medical history, allergies, medications, health status, including review of consultants reports, laboratory and other test data, was performed as part of comprehensive evaluation and provision of chronic care management services.   SDOH:  (Social Determinants of Health) assessments and interventions performed:    CCM Care Plan  Allergies  Allergen Reactions   Bydureon [Exenatide] Nausea And Vomiting   Glipizide Other (See Comments)    Leg cramps, fatigue, and weight gain   Trulicity [Dulaglutide] Diarrhea   Invokana [Canagliflozin] Other (See Comments)    Yeast infection   Victoza [Liraglutide] Nausea Only and Other (See Comments)    Constipation,belching,bloating    Medications Reviewed Today     Reviewed by Darius Bump, [place] (Pharmacist) on 12/27/21 at 1121  Med List Status: <None>   Medication Order Taking? Sig Documenting Provider Last Dose Status Informant  atorvastatin (LIPITOR) 40 MG tablet [id number] Yes Take 1 tablet (40 mg total) by mouth daily. Lelon Perla, MD Taking Active   B-D ULTRAFINE III SHORT PEN 31G X 8 MM MISC [id number] Yes SMARTSIG:1 Each SUB-Q Daily [provider] Taking Active   Calcium Carbonate-Vit D-Min (CALCIUM 1200 PO) [id number] Yes Take by mouth. [provider] Taking Active   carvedilol (COREG) 3.125 MG tablet [id number] Yes Take 1 tablet (3.125 mg total) by mouth 2 (two) times daily. Lelon Perla, MD Taking Active   Continuous Blood Gluc Sensor (FREESTYLE LIBRE 3 SENSOR) [place] [id number] Yes 1 each by Does not apply route every 14 (fourteen) days. Sensors to be changed every 14 days. DX E11.8 [name], MD Taking Active   dapagliflozin propanediol (FARXIGA) 10 MG TABS tablet [id number] Yes TAKE 1 TABLET BY MOUTH EVERY DAY BEFORE BREAKFAST [name], MD Taking Active   ezetimibe (ZETIA) 10 MG tablet [id number] Yes Take 1 tablet (10 mg total)  by mouth daily. Lelon Perla, MD Taking Active   glucose blood (ON CALL PLUS BLOOD GLUCOSE) test strip [id number] Yes Dx DM E11.65. Check fasting blood sugar every morning and 2 hours after large meals daily. Check 3 times daily [name], MD Taking Active   Insulin Degludec [facility]) [id number] Yes Inject 30 Units into the skin at bedtime. 30- 40 units [provider] Taking Active   metFORMIN (GLUCOPHAGE-XR) 500 MG 24 hr tablet [id number] Yes TAKE 2 TABLETS BY MOUTH EVERY DAY WITH BREAKFAST [name], MD Taking Active   Omega-3 Fatty Acids (FISH OIL) 1200 MG CAPS [id number] Yes Take 1 capsule by mouth daily. [provider] Taking Active   pantoprazole (PROTONIX) 40 MG tablet [id number] Yes TAKE 1 TABLET BY MOUTH EVERY DAY [name], MD Taking Active   Probiotic Product (PROBIOTIC  PO) [id number] Yes Take by mouth. [provider] Taking Active   rOPINIRole (REQUIP) 3 MG tablet [id number] Yes TAKE 1 TABLET BY MOUTH EVERY DAY [name], MD Taking Active   sacubitril-valsartan [facility]) 49-51 MG [id number] Yes Take 1 tablet by mouth 2 (two) times daily. Larey Dresser, MD Taking Active   Semaglutide 7 MG TABS [id number] Yes Take 1 tablet by mouth daily. Via patient assistance [provider] Taking Active            Med Note Cyndra Numbers Dec 11, 2021 11:49 AM)    spironolactone (ALDACTONE) 25 MG tablet [id number] Yes Take 0.5 tablets (12.5 mg total) by mouth daily. Larey Dresser, MD Taking Active   vitamin E 100 UNIT capsule [id number] Yes Take 200 Units by mouth daily. [provider] Taking Active             Patient Active Problem List   Diagnosis Date Noted   Left bundle branch block (LBBB) determined by electrocardiography 08/08/2021   Gastroesophageal reflux disease without esophagitis 09/19/2020   Severe obesity (BMI 35.0-39.9) with comorbidity ([place]) 05/25/2019   Fatty liver  12/25/2017   BP (high blood pressure) 03/28/2015   Dyslipidemia 03/28/2015   Insomnia 03/28/2015   Restless leg 03/28/2015   Mass of arm 03/14/2015   Diabetes mellitus type 2, controlled ([place]) 03/14/2015   Obstructive apnea 09/03/2012   HYPERKERATOSIS 04/01/2009    Immunization History  Administered Date(s) Administered   Moderna Sars-Covid-2 Vaccination 01/06/2020, 02/12/2020   Pneumococcal Conjugate-13 12/25/2017   Tdap 03/26/2007, 06/29/2015    Conditions to be addressed/monitored: HTN, HLD, and DMII  Care Plan : Medication Management  Updates made by Darius Bump, [place] since 12/28/2021 12:00 AM     Problem: HTN, DM, HLD   Priority: High     Long-Range Goal: Disease Progression Prevention   Recent Progress: On track  Priority: High  Note:   Current Barriers:  Unable to achieve control of diabetes   Pharmacist Clinical Goal(s):  Over the next 30 days, patient will achieve control of diabetes as evidenced by blood glucose values and A1c through collaboration with PharmD and provider.   Interventions: 1:1 collaboration with [name], MD regarding development and update of comprehensive plan of care as evidenced by provider attestation and co-signature Inter-disciplinary care team collaboration (see longitudinal plan of care) Comprehensive medication review performed; medication list updated in electronic medical record  Diabetes:  Uncontrolled; tresiba 45 units daily, farxiga 3m daily, began rybelsus 335mon 07/13/21.  Current glucose readings: via CGM - switched to [employer]  Date of Download: 12/27/21 % Time CGM is active: 97% Average Glucose: 197 mg/dL Glucose Management Indicator: 8%  Glucose Variability: 21.4% (goal <36%) Time in Goal:  - Time in range 70-180: 45% - Time above range: 55% - Time below range: 0% Observed patterns: dinner still the time of highest readings  Date of Download: 11/08/21 % Time CGM is active: 91% Average Glucose:  194 mg/dL Glucose Management Indicator: 8.0%  Glucose Variability: 19.2% (goal <36%) Time in Goal:  - Time in range 70-180: 41% - Time above range: 59% - Time below range: 0% Observed patterns: 6-9pm still highest glucose range  Date of Download: 10/09/21 % Time CGM is active: 78% Average Glucose: 192 mg/dL Glucose Management Indicator: 7.9%  Glucose Variability: 21.1% (goal <36%) Time in Goal:  - Time in range 70-180: 46% - Time above range: 54% - Time below range:  0% Observed patterns:erratic, eating habits have changed over holiday season, which is to be expected.  Date of Download: 09/25/21 % Time CGM is active: 82% Average Glucose: 166 mg/dL Glucose Management Indicator: 7.3%  Glucose Variability: 16.1 (goal <36%) Time in Goal:  - Time in range 70-180: 78% - Time above range: 22% - Time below range: 0% Observed patterns: Still highest range 7-9pm  Date of Download: 09/04/21 % Time CGM is active: 81% Average Glucose: 166 mg/dL Glucose Management Indicator: 7.3%  Glucose Variability: 16.6% (goal <36%) Time in Goal:  - Time in range 70-180: 73% - Time above range: 27% - Time below range: 0% Observed patterns: BG still highest 8-9pm, but still shows improvement  Date of Download: 07/24/21 % Time CGM is active: 85% Average Glucose: 173 mg/dL Glucose Management Indicator: 7.4  Glucose Variability: 16.9 (goal <36%) Time in Goal:  - Time in range 70-180: 65% - Time above range: 35% - Time below range: 0% Observed patterns: still with elevations 8-10pm, after patient gets off of a long day at work she states she eats late. We are working on this.  Date of Download: 07/12/21 % Time CGM is active: 84% Average Glucose: 197 mg/dL Glucose Management Indicator: 8.0%  Glucose Variability: 24.6 (goal <36%) Time in Goal:  - Time in range 70-180: 45% - Time above range: 55% - Time below range: 0% Observed patterns: Still with highest spikes ~9pm, glucose hits  270-300s.    Denies hyper/hypoglycemic symptoms  Previously discussed current meal patterns:  breakfast: oatmeal, eggs, "stays away from white bread";  lunch: ham & cheese 1/2 sandwhich (w/whole wheat), salad, apple;  dinner: pasta, potatoes, attempts meat/vegetables as much as possible;  snacks: crackers, cheese, sneaks cookie, husband brings sweets into house & patient struggles, describes self as "sugar-holic" ;   Making progress toward goals. Patient also expressed cost constraint with tresiba & farxiga. Patient IS eligible for tresiba & rybelsus patient assistance, but not farxiga. Increase to rybelsus 30m daily using medication supply from patient assistance.   Hypertension:  Controlled; entresto 24-280mBID, coreg 3.12592mID;   Current home readings: not currently checking  Denies hypotensive/hypertensive symptoms  Recommended continue current regimen  Hyperlipidemia:  Uncontrolled; atorvastatin 37m84mezetemibe 10mg9mly;   Assessed medication + current labs  Patient Goals/Self-Care Activities Over the next 90 days, patient will:  Take medications as prescribed and utilize CGM, for data to be discussed at future appointments, check BP and write down for future discussion Follow Up Plan: Telephone follow up appointment with care management team member scheduled for: 3-5 months            Medication Assistance:  Rybelsus obtained through [employer] medication assistance program.  Enrollment ends Nov 11 2021. New application in process for 2023 year to include rybelsus and tresiba.  Also provided application for new entresto.  Patient's preferred pharmacy is:  [id number], [place] [id number] [address]: [phone] [place] [address] [address]: [phone] [address] [place] [address]: [phone] pill box? Yes Pt endorses 100% compliance  Follow Up:  Patient agrees to Care Plan and Follow-up.  Plan: Telephone follow up appointment with care management team member scheduled  for:  3-5 months  Larinda Buttery, PharmD Clinical Pharmacist [facility] [phone]

## 2021-12-28 NOTE — Patient Instructions (Signed)
Visit Information  Thank you for taking time to visit with me today. Please don't hesitate to contact me if I can be of assistance to you before our next scheduled telephone appointment.  Following are the goals we discussed today:  Patient Goals/Self-Care Activities Over the next 90 days, patient will:  Take medications as prescribed and utilize CGM, for data to be discussed at future appointments, check BP and write down for future discussion Follow Up Plan: Telephone follow up appointment with care management team member scheduled for: 3-5 months  Please call the care guide team at [phone] if you need to cancel or reschedule your appointment.    Patient verbalizes understanding of instructions and care plan provided today and agrees to view in Micro. Active MyChart status confirmed with patient.    Jillian Huynh

## 2021-12-30 NOTE — Progress Notes (Incomplete)
***In Progress***    Advanced Heart Failure Clinic Note  PCP: [name], MD Cardiology: Dr. Stanford Breed HF Cardiology: Dr. Aundra Dubin  HPI:  76 y.o. with history of LBBB, HTN, and nonischemic cardiomyopathy was referred by Dr. Stanford Breed for evaluation of CHF.  Patient reports exertional dyspnea now for a couple of years.  She was evaluated by her PCP and referred to Dr. Stanford Breed in 09/2021 for dyspnea and a LBBB noted by ECG. Echo in 09/2021 showed EF in the 30% range with normal RV, mild AI, mild MR, and IVC normal.  Coronary CTA following this showed mild nonobstructive CAD.  No history of ETOH or drugs or family history of cardiomyopathy that she knows of.  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     She presented to HF clinic on 12/11/2021 and reported easy fatigability, feeling sleepy during the day and shortness of breath with stairs and walking up an incline. However, she was generally doing better with less dyspnea since starting on cardiac meds. Weight was trending slowly down. SBP was generally in 140s at home. No lightheadedness, palpitations, or syncope.  No orthopnea/PND. No chest pain  Today she returns to HF clinic for pharmacist medication titration. At last visit with MD spironolactone 12.5 mg daily was started and Entresto was increased from 24/26 to 49/51 mg BID.   Overall feeling ***. Dizziness, lightheadedness, fatigue:  Chest pain or palpitations:  How is your breathing?: *** SOB: Able to complete all ADLs. Activity level ***  Weight at home pounds. Takes furosemide/torsemide/bumex *** mg *** daily.  LEE PND/Orthopnea  Appetite *** Low-salt diet:   Physical Exam Cost/affordability of meds   HF Medications: Coreg 3.125 mg BID Entresto 49/51 mg BID Spironolactone 12.5 mg daily Dapaglifozin 10 mg daily  Has the patient been experiencing any side effects to the medications prescribed?  {YES NO:22349}  Does the patient have any problems obtaining  medications due to transportation or finances?   {YES NO:22349}  Understanding of regimen: {excellent/good/fair/poor:19665} Understanding of indications: {excellent/good/fair/poor:19665} Potential of compliance: {excellent/good/fair/poor:19665} Patient understands to avoid NSAIDs. Patient understands to avoid decongestants.    Pertinent Lab Values: Labs 12/11/2021: Serum creatinine 0.65, BUN 16, Potassium 4.0, Sodium 138, BNP 200  Vital Signs: Weight: *** (last clinic weight: 197.4 lbs) Blood pressure: *** 130-140s/70-80s Heart rate: *** 60-70s  Plan - maybe even both A and B A- increase Entresto to 97/103- BMET in 1 week B- increase spiro to 25 mg daily- BMET in 1 week C- increase carvedilol to 6.25 mg BID  Assessment/Plan: 1. Chronic systolic CHF (EF ***), due to ***. NYHA class *** symptoms. 1. Chronic systolic CHF: Nonischemic cardiomyopathy.  Coronary CTA in 09/2021 with mild nonobstructive CAD.  Echo in 09/2021 showed EF 30%, normal RV size and systolic function, mild MR, mild AI, IVC normal. No ETOH.  No FH of cardiomyopathy.  No arrhythmias have been noted.  She may have a LBBB cardiomyopathy.  NYHA class II symptoms.   She is not volume overloaded on exam.  - Continue Coreg 3.125 mg bid.  - Increase Entresto to 49/51 bid.  - Add spironolactone 12.5 mg daily   - Continue dapagliflozin 10 mg daily.  - Repeat echo in 03/2021, if EF remains low will need to consider CRT-D.  - She does not think she could tolerate a cardiac MRI due to claustrophobia.  2. HTN: Medication changes as above.  3. OSA: Strongly suspect OSA. Home sleep study planned.   Follow up in pharmacy  clinic in 3 weeks  Audry Riles, PharmD, BCPS, BCCP, CPP Heart Failure Clinic Pharmacist [phone]

## 2022-01-01 ENCOUNTER — Telehealth: Payer: Self-pay | Admitting: Pharmacist

## 2022-01-01 ENCOUNTER — Other Ambulatory Visit: Payer: Self-pay

## 2022-01-01 ENCOUNTER — Telehealth (HOSPITAL_COMMUNITY): Payer: Self-pay | Admitting: Pharmacist

## 2022-01-01 ENCOUNTER — Telehealth (HOSPITAL_COMMUNITY): Payer: Self-pay | Admitting: Surgery

## 2022-01-01 ENCOUNTER — Ambulatory Visit (HOSPITAL_COMMUNITY)
Admission: RE | Admit: 2022-01-01 | Discharge: 2022-01-01 | Disposition: A | Discharge status: Home / Self Care | Payer: Medicare Other | Source: Ambulatory Visit | Attending: Cardiology | Admitting: Cardiology

## 2022-01-01 ENCOUNTER — Other Ambulatory Visit: Payer: Self-pay | Admitting: Family Medicine

## 2022-01-01 ENCOUNTER — Other Ambulatory Visit (HOSPITAL_COMMUNITY): Payer: Self-pay

## 2022-01-01 VITALS — BP 150/100 | HR 60 | Wt 196.6 lb

## 2022-01-01 DIAGNOSIS — I110 Hypertensive heart disease with heart failure: Secondary | ICD-10-CM | POA: Diagnosis not present

## 2022-01-01 DIAGNOSIS — I447 Left bundle-branch block, unspecified: Secondary | ICD-10-CM | POA: Insufficient documentation

## 2022-01-01 DIAGNOSIS — Z7901 Long term (current) use of anticoagulants: Secondary | ICD-10-CM | POA: Insufficient documentation

## 2022-01-01 DIAGNOSIS — I5022 Chronic systolic (congestive) heart failure: Secondary | ICD-10-CM | POA: Insufficient documentation

## 2022-01-01 DIAGNOSIS — Z79899 Other long term (current) drug therapy: Secondary | ICD-10-CM | POA: Insufficient documentation

## 2022-01-01 DIAGNOSIS — E118 Type 2 diabetes mellitus with unspecified complications: Secondary | ICD-10-CM

## 2022-01-01 DIAGNOSIS — I428 Other cardiomyopathies: Secondary | ICD-10-CM | POA: Diagnosis not present

## 2022-01-01 LAB — BASIC METABOLIC PANEL
Anion gap: 9 (ref 5–15)
BUN: 21 mg/dL (ref 8–23)
CO2: 24 mmol/L (ref 22–32)
Calcium: 9.8 mg/dL (ref 8.9–10.3)
Chloride: 105 mmol/L (ref 98–111)
Creatinine, Ser: 0.71 mg/dL (ref 0.44–1.00)
GFR, Estimated: >60 mL/min (ref >60)
Glucose, Bld: 142 mg/dL — ABNORMAL HIGH (ref 70–99)
Potassium: 4.2 mmol/L (ref 3.5–5.1)
Sodium: 138 mmol/L (ref 135–145)

## 2022-01-01 MED ORDER — ENTRESTO 97-103 MG PO TABS: 1.0000 | ORAL_TABLET | Freq: Two times a day (BID) | ORAL | 3 refills | Status: DC

## 2022-01-01 MED ORDER — ENTRESTO 97-103 MG PO TABS: 1.0000 | ORAL_TABLET | Freq: Two times a day (BID) | ORAL | 11 refills | Status: DC

## 2022-01-01 NOTE — Progress Notes (Signed)
I have contacted [employer] about the status on patient assistance application on Rybelsus 7 mg. The application has been approved until the end of 2023 and will delivered with in 10/14 business days to the clinic. I have also spoke with a representative for the [place] application status they has received the application but are trying to process the form with a previous proof of income. They will call me back with in 24-48 hours to give me an update on the process. I could not get a hold of any one for Novo I have been on hold for over an hour to recheck on the status of Tresiba Medication. I will try to follow up again.  Corrie Mckusick, [place]

## 2022-01-01 NOTE — Patient Instructions (Addendum)
It was a pleasure seeing you today!  MEDICATIONS: -We are changing your medications today -Increase Entresto to 97/103 mg (1 tablet) twice daily (you can take 2 tablets of the 49/51 mg dose in the morning and two tablets in the evening until this is completed) -If you have losartan at home do not take this as the Delene Loll replaces it -[facility]  -Please bring your medications to your appointment on 01/24/22 -Call if you have questions about your medications.  LABS: -We will call you if your labs need attention.  NEXT APPOINTMENT: Return to clinic in 01/24/22 with pharmacy clinic.  In general, to take care of your heart failure: -Limit your fluid intake to 2 Liters (half-gallon) per day.   -Limit your salt intake to ideally 2-3 grams (2000-3000 mg) per day. -Weigh yourself daily and record, and bring that "weight diary" to your next appointment.  (Weight gain of 2-3 pounds in 1 day typically means fluid weight.) -The medications for your heart are to help your heart and help you live longer.   -Please contact us before stopping any of your heart medications.  Call the clinic at [phone] with questions or to reschedule future appointments.

## 2022-01-01 NOTE — Progress Notes (Signed)
Advanced Heart Failure Clinic Note  PCP: [name], MD Cardiology: Dr. Stanford Breed HF Cardiology: Dr. Aundra Dubin  HPI:  76 y.o. with history of LBBB, HTN, and nonischemic cardiomyopathy was referred by Dr. Stanford Breed for evaluation of CHF.  Patient reports exertional dyspnea now for a couple of years.  She was evaluated by her PCP and referred to Dr. Stanford Breed in 09/2021 for dyspnea and a LBBB noted by ECG. Echo in 09/2021 showed EF in the 30% range with normal RV, mild AI, mild MR, and IVC normal.  Coronary CTA following this showed mild nonobstructive CAD.  No history of ETOH or drugs or family history of cardiomyopathy that she knows of.  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     She presented to HF clinic on 12/11/2021 and reported easy fatigability, feeling sleepy during the day and shortness of breath with stairs and walking up an incline. However, she was generally doing better with less dyspnea since starting on cardiac meds. Weight was trending slowly down. SBP was generally in 140s at home. No lightheadedness, palpitations, or syncope.  No orthopnea/PND. No chest pain  Today she returns to HF clinic for pharmacist medication titration. At last visit with MD spironolactone 12.5 mg daily was started and Entresto was increased from 24/26 mg to 49/51 mg BID. Unfortunately, the patient did not bring her medications with her to clinic today and does not remember all of her medications. She did however pick-up and start the Entresto and spironolactone. She is unsure if she has been taking the losartan or not (noted to be filled per dispense history report). I instructed her to stop losartan if she is still taking this and bring her medications to her next appointment.  Overall she is feeling much better and is "no longer constantly thinking about laying down". Denies dizziness, lightheadedness, chest pain or palpitations. Her fatigue is much improved which she is very happy about. Her  breathing has also significantly improved since starting Entresto and she is not SOB unless she is walking long distances or on an incline. 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 Her weight at home is stable at 193-195 lbs and she denies PND. She does have bilateral trace/1+ LEE in ankles, which is stable per patient. She sleeps on 1 pillow at night.  Her appetite is always good, however she is trying to lose weight.   HF Medications: Carvedilol 3.125 mg BID Entresto 49/51 mg BID Spironolactone 12.5 mg daily Dapaglifozin 10 mg daily  Has the patient been experiencing any side effects to the medications prescribed? No  Does the patient have any problems obtaining medications due to transportation or finances?  PAN grant approved today for [employer] patient assistance for semaglutide via another office  Understanding of regimen: good Understanding of indications: good Potential of compliance: good Patient understands to avoid NSAIDs. Patient understands to avoid decongestants.    Pertinent Lab Values: Labs 12/11/2021: Serum creatinine 0.65, BUN 16, Potassium 4.0, Sodium 138, BNP 200 Labs: 01/01/2022: BMET pending  Vital Signs: Weight: 196.6 lbs (last clinic weight: 197.4 lbs) Blood pressure: 150/100 mmHg  Heart rate: 60 bpm  Assessment/Plan: 1. Chronic systolic CHF: Nonischemic cardiomyopathy.  Coronary CTA in 09/2021 with mild nonobstructive CAD.  Echo in 09/2021 showed EF 30%, normal RV size and systolic function, mild MR, mild AI, IVC normal. No ETOH.  No FH of cardiomyopathy.  No arrhythmias have been noted.  She may have  a LBBB cardiomyopathy.   - NYHA class II symptoms.  She is not volume overloaded on exam.  - Continue carvedilol 3.125 mg BID.  - Increase Entresto to 97/103 mg BID. Repeat in BMET in 3 weeks. - Continue spironolactone 12.5 mg daily   - Continue dapagliflozin 10 mg daily.  - Repeat echo in 03/2021, if EF remains  low will need to consider CRT-D.  - She does not think she could tolerate a cardiac MRI due to claustrophobia.  2. HTN: Medication changes as above.  3. OSA: Strongly suspect OSA. Home sleep study planned.   Follow up in pharmacy clinic in 3 weeks  Audry Riles, PharmD, BCPS, BCCP, CPP Heart Failure Clinic Pharmacist [phone]

## 2022-01-01 NOTE — Telephone Encounter (Signed)
I called patient to discuss the home sleep study after receiving a message from another office that she is unaware how to proceed with the study.  I left a message for a return call.

## 2022-01-01 NOTE — Telephone Encounter (Signed)
Was successful in securing patient an $33 grant from [name] [place]) to provide copayment coverage for [place].  This will keep the out of pocket expense at $0.     I have spoken with the patient.    The billing information is as follows and has been shared with the pharmacy.   Member ID: [id number] Group ID: [id number] RxBin ID: [id number] PCN: [id number] Eligibility Start Date: 10/03/2021 Eligibility End Date: 12/31/2022 Assistance Amount: $1,200.0  Fund:  heart failure  Audry Riles, PharmD, BCPS, BCCP, CPP Heart Failure Clinic Pharmacist [phone]

## 2022-01-04 ENCOUNTER — Telehealth (HOSPITAL_COMMUNITY): Payer: Self-pay | Admitting: Surgery

## 2022-01-04 NOTE — Telephone Encounter (Signed)
I called patient again to review steps to complete ordered home sleep study.  I again left a message for a return call.

## 2022-01-09 DIAGNOSIS — E785 Hyperlipidemia, unspecified: Secondary | ICD-10-CM

## 2022-01-09 DIAGNOSIS — E1169 Type 2 diabetes mellitus with other specified complication: Secondary | ICD-10-CM | POA: Diagnosis not present

## 2022-01-09 DIAGNOSIS — I10 Essential (primary) hypertension: Secondary | ICD-10-CM | POA: Diagnosis not present

## 2022-01-12 ENCOUNTER — Other Ambulatory Visit: Payer: Self-pay | Admitting: *Deleted

## 2022-01-12 DIAGNOSIS — E118 Type 2 diabetes mellitus with unspecified complications: Secondary | ICD-10-CM

## 2022-01-12 MED ORDER — INSULIN DEGLUDEC 100 UNIT/ML ~~LOC~~ SOLN: 50.0000 [IU] | Freq: Every day | SUBCUTANEOUS | 1 refills | Status: DC

## 2022-01-12 MED ORDER — BD PEN NEEDLE SHORT U/F 31G X 8 MM MISC: 4 refills | Status: DC

## 2022-01-15 ENCOUNTER — Other Ambulatory Visit: Payer: Self-pay

## 2022-01-15 DIAGNOSIS — E118 Type 2 diabetes mellitus with unspecified complications: Secondary | ICD-10-CM

## 2022-01-15 MED ORDER — BD PEN NEEDLE SHORT U/F 31G X 8 MM MISC: 4 refills | Status: DC

## 2022-01-21 NOTE — Progress Notes (Incomplete)
***In Progress***    Advanced Heart Failure Clinic Note   HPI:  76 y.o. with history of LBBB, HTN, and nonischemic cardiomyopathy was referred by Dr. Stanford Breed for evaluation of CHF.  Patient reports exertional dyspnea now for a couple of years.  She was evaluated by her PCP and referred to Dr. Stanford Breed in 09/2021 for dyspnea and a LBBB noted by ECG. Echo in 09/2021 showed EF in the 30% range with normal RV, mild AI, mild MR, and IVC normal.  Coronary CTA following this showed mild nonobstructive CAD.  No history of ETOH or drugs or family history of cardiomyopathy that she knows of.  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     She presented to HF clinic on 12/11/2021 and reported easy fatigability, feeling sleepy during the day and shortness of breath with stairs and walking up an incline. However, she was generally doing better with less dyspnea since starting on cardiac meds. Weight was trending slowly down. SBP was generally in 140s at home. No lightheadedness, palpitations, or syncope.  No orthopnea/PND. No chest pain   ***Patient was seen by pharmacist on 01/01/2022. She had trouble remembering her medications and did not bring her medications with her to clinic. She did however pick-up and start the Entresto and spironolactone. She is unsure if she has been taking the losartan or not (noted to be filled per dispense history report). I instructed her to stop losartan if she is still taking this and bring her medications to her next appointment.  Overall she is feeling much better and is "no longer constantly thinking about laying down". Denies dizziness, lightheadedness, chest pain or palpitations. Her fatigue is much improved which she is very happy about. Her breathing has also significantly improved since starting Entresto and she is not SOB unless she is walking long distances or on an incline. 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 Her weight at home is  stable at 193-195 lbs and she denies PND. She does have bilateral trace/1+ LEE in ankles, which is stable per patient. She sleeps on 1 pillow at night.  Her appetite is always good, however she is trying to lose weight.   Today he returns to HF clinic for pharmacist medication titration. At last visit with MD ***.   Overall feeling ***. Dizziness, lightheadedness, fatigue:  Chest pain or palpitations:  How is your breathing?: *** SOB: Able to complete all ADLs. Activity level ***  Weight at home pounds. Takes furosemide/torsemide/bumex *** mg *** daily.  LEE PND/Orthopnea  Appetite *** Low-salt diet:   Physical Exam Cost/affordability of meds   HF Medications:   Has the patient been experiencing any side effects to the medications prescribed?  {YES NO:22349}  Does the patient have any problems obtaining medications due to transportation or finances?   {YES NO:22349}  Understanding of regimen: {excellent/good/fair/poor:19665} Understanding of indications: {excellent/good/fair/poor:19665} Potential of compliance: {excellent/good/fair/poor:19665} Patient understands to avoid NSAIDs. Patient understands to avoid decongestants.    Pertinent Lab Values: Serum creatinine ***, BUN ***, Potassium ***, Sodium ***, BNP ***, Magnesium ***, Digoxin ***   Vital Signs: Weight: *** (last clinic weight: ***) Blood pressure: ***  Heart rate: ***   Assessment/Plan: 1. Chronic systolic CHF: Nonischemic cardiomyopathy.  Coronary CTA in 09/2021 with mild nonobstructive CAD.  Echo in 09/2021 showed EF 30%, normal RV size and systolic function, mild MR, mild AI, IVC normal. No ETOH.  No FH of cardiomyopathy.  No arrhythmias have  been noted.  She may have a LBBB cardiomyopathy.   - NYHA class II symptoms.  She is not volume overloaded on exam.  - Continue carvedilol 3.125 mg BID.  - Increase Entresto to 97/103 mg BID. Repeat in BMET in 3 weeks. - Continue spironolactone 12.5 mg daily   -  Continue dapagliflozin 10 mg daily.  - Repeat echo in 03/2021, if EF remains low will need to consider CRT-D.  - She does not think she could tolerate a cardiac MRI due to claustrophobia.  2. HTN: Medication changes as above.  3. OSA: Strongly suspect OSA. Home sleep study planned.   Follow up in pharmacy clinic in 3 weeks   Audry Riles, PharmD, BCPS, BCCP, CPP Heart Failure Clinic Pharmacist [phone]

## 2022-01-24 ENCOUNTER — Ambulatory Visit (HOSPITAL_COMMUNITY)
Admission: RE | Admit: 2022-01-24 | Discharge: 2022-01-24 | Disposition: A | Discharge status: Home / Self Care | Payer: Medicare Other | Source: Ambulatory Visit | Attending: Internal Medicine | Admitting: Internal Medicine

## 2022-01-24 ENCOUNTER — Ambulatory Visit (HOSPITAL_COMMUNITY)
Admission: RE | Admit: 2022-01-24 | Discharge: 2022-01-24 | Disposition: A | Discharge status: Home / Self Care | Payer: Medicare Other | Source: Ambulatory Visit | Attending: Family Medicine | Admitting: Family Medicine

## 2022-01-24 ENCOUNTER — Other Ambulatory Visit: Payer: Self-pay

## 2022-01-24 VITALS — BP 132/82 | HR 61 | Wt 195.2 lb

## 2022-01-24 DIAGNOSIS — I110 Hypertensive heart disease with heart failure: Secondary | ICD-10-CM | POA: Insufficient documentation

## 2022-01-24 DIAGNOSIS — Z7984 Long term (current) use of oral hypoglycemic drugs: Secondary | ICD-10-CM | POA: Insufficient documentation

## 2022-01-24 DIAGNOSIS — I5022 Chronic systolic (congestive) heart failure: Secondary | ICD-10-CM

## 2022-01-24 DIAGNOSIS — I2510 Atherosclerotic heart disease of native coronary artery without angina pectoris: Secondary | ICD-10-CM | POA: Insufficient documentation

## 2022-01-24 DIAGNOSIS — F40240 Claustrophobia: Secondary | ICD-10-CM | POA: Diagnosis not present

## 2022-01-24 DIAGNOSIS — I429 Cardiomyopathy, unspecified: Secondary | ICD-10-CM | POA: Diagnosis not present

## 2022-01-24 DIAGNOSIS — I447 Left bundle-branch block, unspecified: Secondary | ICD-10-CM | POA: Insufficient documentation

## 2022-01-24 DIAGNOSIS — I428 Other cardiomyopathies: Secondary | ICD-10-CM | POA: Diagnosis not present

## 2022-01-24 DIAGNOSIS — Z79899 Other long term (current) drug therapy: Secondary | ICD-10-CM | POA: Diagnosis not present

## 2022-01-24 MED ORDER — CARVEDILOL 3.125 MG PO TABS: 3.1250 mg | ORAL_TABLET | Freq: Two times a day (BID) | ORAL | 3 refills | Status: DC

## 2022-01-24 MED ORDER — SPIRONOLACTONE 25 MG PO TABS: 25.0000 mg | ORAL_TABLET | Freq: Every day | ORAL | 3 refills | Status: DC

## 2022-01-24 NOTE — Progress Notes (Signed)
?  ?Advanced Heart Failure Clinic Note  ? ?PCP: [name], MD ?Cardiology: Dr. Stanford Breed ?HF Cardiology: Dr. Aundra Dubin ?  ? ?HPI:  ?76 y.o. with history of LBBB, HTN, and nonischemic cardiomyopathy was referred by Dr. Stanford Breed for evaluation of CHF.  Patient reported exertional dyspnea for a couple of years when she was evaluated by her PCP and was referred to Dr. Stanford Breed in 09/2021 for dyspnea and a LBBB noted by ECG. Echo in 09/2021 showed EF in the 30% range with normal RV, mild AI, mild MR, and IVC normal.  Coronary CTA following this showed mild nonobstructive CAD.  No history of ETOH or drugs or family history of cardiomyopathy that she knows of.  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   ?  ?She presented to HF clinic on 12/11/2021 and reported easy fatigability, feeling sleepy during the day and shortness of breath with stairs and walking up an incline. However, she reported generally doing better with less dyspnea since starting on cardiac meds. Weight was trending slowly down. SBP was generally around 140s at home. Denied lightheadedness, palpitations, or syncope.  Denied orthopnea/PND. Denied chest pain. ?  ?Patient was seen at pharmacy clinic on 01/01/2022. She reported trouble remembering her medications and did not bring them with her to clinic. She did however report starting the Entresto and spironolactone. She was unsure if she had been taking the losartan or not (noted to be filled per dispense history report). She was instructed to stop losartan if she was still taking, and she was asked to bring her medications to her next appointment.  Overall she reported feeling much better and  "no longer constantly thinking about laying down". Denied dizziness, lightheadedness, chest pain or palpitations. Her fatigue had improved which she was very happy about. Her breathing had also significantly improved since she started [facility] and she reported no SOB unless she walked long distances or was  on an incline. She reported she was able to complete all her ADLs  while working as a Theme park manager. Her weight at home was stable at 193-195 lbs and she denied PND. She did have bilateral trace/1+ LEE in ankles on exam, which was stable per patient. She reported sleeping on 1 pillow at night.  Reported appetite was good, however she stated she was trying to lose weight.  ? ?Today she returns to HF clinic for pharmacist medication titration with her husband who helps with managing her medications and logging her vitals. At last visit at pharmacy clinic, [facility] was increased to 97/103 mg BID. Overall, she reports feeling much improved since paying more attention to her health. She has brought in her medications for review, and reports she's taking her GDMT correctly. Home BP has been around 120-130s/70s. BP in clinic today is 132/82. She reports occasional dizziness but this is unchanged from baseline. Reports some fatigue but overall has noticed a vast improvement in fatigue. Reports mild chest pain but near baseline. Denies palpitations. Reports improvement in breathing and that she experiences SOB when walking up inclines or overexerting herself. 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 Weight at home has been stable at 192-195 lbs. She does not take a diuretic. She has trace pedal edema at baseline. Denies PND/orthopnea. Appetite ok. Of note, she mentions she's been having trouble with her memory, including forgetting her granddaughter's name. Advised her to follow-up with her PCP about her memory.  ? ?HF Medications: ?Carvedilol 3.125 mg BID ?Entresto [phone]  mg BID ?Spironolactone 12.5 mg daily ?Farxiga 10 mg daily ? ?Has the patient been experiencing any side effects to the medications prescribed? No ?  ?Does the patient have any problems obtaining medications due to transportation or finances?  [employer]. PAN grant approved for Entresto/Farxiga ?Gets patient assistance for  semaglutide via another office. ? ?Understanding of regimen: fair ?Understanding of indications: good ?Potential of compliance: good ?Patient understands to avoid NSAIDs. ?Patient understands to avoid decongestants. ?  ? ?Pertinent Lab Values: ?01/01/2022 - Serum creatinine 0.71, BUN 21, Potassium 4.2, Sodium 138 ? ?Vital Signs: ?Weight: 195.2 lbs  (last clinic weight: 196.6 lbs) ?Blood pressure: 132/82  ?Heart rate: 61 ? ?Assessment/Plan: ?1. Chronic systolic CHF: Nonischemic cardiomyopathy.  Coronary CTA in 09/2021 with mild nonobstructive CAD.  Echo in 09/2021 showed EF 30%, normal RV size and systolic function, mild MR, mild AI, IVC normal. No ETOH.  No FH of cardiomyopathy.  No arrhythmias have been noted.  She may have a LBBB cardiomyopathy.   ?- NYHA class II symptoms.  She is not volume overloaded on exam.  ?- Continue carvedilol 3.125 mg BID.  ?- Continue Entresto 97/103 mg BID.  ?- Increase spironolactone to 25 mg daily. Will get BMET locally in 1 week.  ?- Continue Farxiga 10 mg daily.  ?- Repeat echo 03/2022, if EF remains low will need to consider CRT-D.  ?- She does not think she could tolerate a cardiac MRI due to claustrophobia.  ?2. HTN: Medication changes as above.  ?3. OSA: Strongly suspect OSA. Patient was counseled today on how to set up home sleep study.  ?  ?Follow up with BMET locally in a week. Follow-up with Dr. Aundra Dubin in a month on 02/26/2022. ? ? ?Audry Riles, PharmD, BCPS, BCCP, CPP ?Heart Failure Clinic Pharmacist ?[phone] ? ? ?

## 2022-01-24 NOTE — Progress Notes (Signed)
Patient was seen and instruction to complete ordered home sleep study reviewed.  She has the App downloaded to her phone and confirms that she understand intructions reviewed today.  She plans to complete the study within the next several days. ?

## 2022-01-24 NOTE — Patient Instructions (Signed)
It was a pleasure seeing you today! ? ?MEDICATIONS: ?-We are changing your medications today ?-Increase spironolactone to 25 mg (1 tablet) daily ?-Call if you have questions about your medications. ? ?NEXT APPOINTMENT: ?Please get labs drawn in 1 week locally ?Return to clinic in 1 month with Dr. Aundra Dubin ? ?In general, to take care of your heart failure: ?-Limit your fluid intake to 2 Liters (half-gallon) per day.   ?-Limit your salt intake to ideally 2-3 grams (2000-3000 mg) per day. ?-Weigh yourself daily and record, and bring that "weight diary" to your next appointment.  (Weight gain of 2-3 pounds in 1 day typically means fluid weight.) ?-The medications for your heart are to help your heart and help you live longer.   ?-Please contact us before stopping any of your heart medications. ? ?Call the clinic at [phone] with questions or to reschedule future appointments. ?

## 2022-01-29 ENCOUNTER — Encounter (INDEPENDENT_AMBULATORY_CARE_PROVIDER_SITE_OTHER): Payer: Medicare Other | Admitting: Cardiology

## 2022-01-29 DIAGNOSIS — G4733 Obstructive sleep apnea (adult) (pediatric): Secondary | ICD-10-CM

## 2022-02-05 DIAGNOSIS — I5022 Chronic systolic (congestive) heart failure: Secondary | ICD-10-CM | POA: Diagnosis not present

## 2022-02-08 NOTE — Procedures (Signed)
? ? ?  Sleep Study Report ? ?Patient Information ?Study Date: 01/29/22 ?Patient Name: Jillian Huynh ?Patient ID: [id number] ?Birth Date: 2046/07/06 ?Age: 76 ?Gender: Female ?Referring Physician: Loralie Champagne, MD ? ?TEST DESCRIPTION: Home sleep apnea testing was completed using the WatchPat, a Type 1 device, utilizing  ?peripheral arterial tonometry (PAT), chest movement, actigraphy, pulse oximetry, pulse rate, body position and snore.  ?AHI was calculated with apnea and hypopnea using valid sleep time as the denominator. RDI includes apneas,  ?hypopneas, and RERAs. The data acquired and the scoring of sleep and all associated events were performed in  ?accordance with the recommended standards and specifications as outlined in the AASM Manual for the Scoring of  ?Sleep and Associated Events 2.2.0 (2015). ? ?FINDINGS: ?1. Moderate Obstructive Sleep Apnea with AHI 17.7/hr.  ?2. No significant Central Sleep Apnea with pAHIc 2.5/hr. ?3. Oxygen desaturations as low as 62%. ?4. Severe snoring was present. O2 sats were < 88% for 0.5 min. ?5. Total sleep time was 6 hrs and 53 min. ?6. 8.1% of total sleep time was spent in REM sleep.  ?7. Shortened sleep onset latency at 6 min ?8. Prolonged REM sleep onset latency at 186 min.  ?9. Total awakenings were 19.  ? ?DIAGNOSIS:  ?Moderate Obstructive Sleep Apnea (G47.33) ? ?RECOMMENDATIONS: ?1. Clinical correlation of these findings is necessary. The decision to treat obstructive sleep apnea (OSA) is usually  ?based on the presence of apnea symptoms or the presence of associated medical conditions such as Hypertension,  ?Congestive Heart Failure, Atrial Fibrillation or Obesity. The most common symptoms of OSA are snoring, gasping for  ?breath while sleeping, daytime sleepiness and fatigue.  ? ?2. Initiating apnea therapy is recommended given the presence of symptoms and/or associated conditions.  ?Recommend proceeding with one of the following: ? ? a. Auto-CPAP therapy with a pressure  range of 5-20cm H2O. ? ? b. An oral appliance (OA) that can be obtained from certain dentists with expertise in sleep medicine. These are  ?primarily of use in non-obese patients with mild and moderate disease. ? ? c. An ENT consultation which may be useful to look for specific causes of obstruction and possible treatment  ?options. ? ? d. If patient is intolerant to PAP therapy, consider referral to ENT for evaluation for hypoglossal nerve stimulator.  ? ?3. Close follow-up is necessary to ensure success with CPAP or oral appliance therapy for maximum benefit . ? ?4. A follow-up oximetry study on CPAP is recommended to assess the adequacy of therapy and determine the need  ?for supplemental oxygen or the potential need for Bi-level therapy. An arterial blood gas to determine the adequacy of  ?baseline ventilation and oxygenation should also be considered. ? ?5. Healthy sleep recommendations include: adequate nightly sleep (normal 7-9 hrs/night), avoidance of caffeine after  ?noon and alcohol near bedtime, and maintaining a sleep environment that is cool, dark and quiet. ? ?6. Weight loss for overweight patients is recommended. Even modest amounts of weight loss can significantly  ?improve the severity of sleep apnea. ? ?7. Snoring recommendations include: weight loss where appropriate, side sleeping, and avoidance of alcohol before  ?bed. ? ?8. Operation of motor vehicle should not be performed when sleepy. ? ?Signature: ?Fransico Him, MD; [facility]; [place], [place] Board of  Sleep Medicine ? ?

## 2022-02-12 ENCOUNTER — Ambulatory Visit: Payer: Medicare HMO | Admitting: Family Medicine

## 2022-02-12 ENCOUNTER — Telehealth: Payer: Self-pay | Admitting: *Deleted

## 2022-02-12 NOTE — Telephone Encounter (Signed)
Pt informed that her medication is ready for p/u ?

## 2022-02-13 ENCOUNTER — Ambulatory Visit: Payer: Medicare Other

## 2022-02-13 DIAGNOSIS — G4733 Obstructive sleep apnea (adult) (pediatric): Secondary | ICD-10-CM

## 2022-02-14 ENCOUNTER — Other Ambulatory Visit: Payer: Self-pay

## 2022-02-14 DIAGNOSIS — G4733 Obstructive sleep apnea (adult) (pediatric): Secondary | ICD-10-CM

## 2022-02-19 ENCOUNTER — Encounter: Payer: Self-pay | Admitting: Family Medicine

## 2022-02-19 ENCOUNTER — Ambulatory Visit (INDEPENDENT_AMBULATORY_CARE_PROVIDER_SITE_OTHER): Payer: Medicare Other | Admitting: Family Medicine

## 2022-02-19 VITALS — BP 105/53 | HR 63 | Resp 18 | Ht 62.0 in | Wt 194.0 lb

## 2022-02-19 DIAGNOSIS — I10 Essential (primary) hypertension: Secondary | ICD-10-CM

## 2022-02-19 DIAGNOSIS — E118 Type 2 diabetes mellitus with unspecified complications: Secondary | ICD-10-CM

## 2022-02-19 DIAGNOSIS — L299 Pruritus, unspecified: Secondary | ICD-10-CM | POA: Diagnosis not present

## 2022-02-19 LAB — POCT GLYCOSYLATED HEMOGLOBIN (HGB A1C): Hemoglobin A1C: 8.2 % — AB (ref 4.0–5.6)

## 2022-02-19 NOTE — Patient Instructions (Signed)
Please increased your insulin to 42 units.   ?

## 2022-02-19 NOTE — Progress Notes (Addendum)
Established Patient Office Visit  Subjective:  Patient ID: Jillian Huynh, female    DOB: Jul 03, 1946  Age: 76 y.o. MRN: [id number]  CC:  Chief Complaint  Patient presents with   Diabetes    Follow up    Ear Concern    Patient complains of bilateral ear itching and flaking for 2 months.     HPI Jillian Huynh presents for   Diabetes - no hypoglycemic events. No wounds or sores that are not healing well. No increased thirst or urination. Checking glucose at home. Taking medications as prescribed without any side effects. She got approved for her Rebylsus.  Currently using 40 units of Tresiba.  Patient complains of bilateral ear itching and flaking for 2 months.   Itching in both ear.  She had it happen years ago.    She has been following with Cardiology for new on set Heart failure and cardiomyopathy.    Past Medical History:  Diagnosis Date   Diabetes mellitus without complication ([place])    Hyperlipidemia    Hypertension     Past Surgical History:  Procedure Laterality Date   CHOLECYSTECTOMY      Family History  Problem Relation Age of Onset   Diabetes Father    Cancer Father 60       Esophogeal Ca., Lung.   Hypertension Other    Thyroid disease Other    Colon cancer Daughter 83   Glaucoma Mother    Hepatitis C Mother 60       Died from.   Glaucoma Sister    Glaucoma Sister    Glaucoma Maternal Aunt     Social History   Socioeconomic History   Marital status: Married    Spouse name: Biochemist, clinical   Number of children: 3   Years of education: 16.5   Highest education level: Some college, no degree  Occupational History   Occupation: Hair Stylist  Tobacco Use   Smoking status: Never   Smokeless tobacco: Never  Substance and Sexual Activity   Alcohol use: No    Alcohol/week: 0.0 standard drinks of alcohol   Drug use: No   Sexual activity: Yes    Partners: Male  Other Topics Concern   Not on file  Social History Narrative   Lives with her husband. She  is working full time as a Probation officer. Two of her children live close by. One is in [place]. One of her daughter's is deceased. Her hobbies include watch tv, listening to music and reading.    Social Determinants of Health   Financial Resource Strain: Low Risk  (07/22/2021)   Overall Financial Resource Strain (CARDIA)    Difficulty of Paying Living Expenses: Not hard at all  Food Insecurity: No Food Insecurity (07/22/2021)   Hunger Vital Sign    Worried About Running Out of Food in the Last Year: Never true    Ran Out of Food in the Last Year: Never true  Transportation Needs: No Transportation Needs (07/22/2021)   PRAPARE - Hydrologist (Medical): No    Lack of Transportation (Non-Medical): No  Physical Activity: Inactive (07/22/2021)   Exercise Vital Sign    Days of Exercise per Week: 0 days    Minutes of Exercise per Session: 0 min  Stress: No Stress Concern Present (07/22/2021)   [place]    Feeling of Stress : Not at all  Social Connections: Moderately Integrated (07/22/2021)  Social Licensed conveyancer [NHANES]    Frequency of Communication with Friends and Family: More than three times a week    Frequency of Social Gatherings with Friends and Family: More than three times a week    Attends Religious Services: Never    Marine scientist or Organizations: Yes    Attends Archivist Meetings: 1 to 4 times per year    Marital Status: Married  Human resources officer Violence: Not At Risk (07/22/2021)   Humiliation, Afraid, Rape, and Kick questionnaire    Fear of Current or Ex-Partner: No    Emotionally Abused: No    Physically Abused: No    Sexually Abused: No    Outpatient Medications Prior to Visit  Medication Sig Dispense Refill   atorvastatin (LIPITOR) 40 MG tablet Take 1 tablet (40 mg total) by mouth daily. 90 tablet 3   B-D ULTRAFINE III SHORT PEN 31G X 8 MM  MISC SUB-Q Daily 100 each 4   Calcium Carbonate-Vit D-Min (CALCIUM 1200 PO) Take 1 tablet by mouth in the morning and at bedtime.     Continuous Blood Gluc Sensor (FREESTYLE LIBRE 3 SENSOR) MISC 1 each by Does not apply route every 14 (fourteen) days. Sensors to be changed every 14 days. DX E11.8 2 each 5   ezetimibe (ZETIA) 10 MG tablet Take 1 tablet (10 mg total) by mouth daily. 90 tablet 3   FARXIGA 10 MG TABS tablet TAKE 1 TABLET BY MOUTH EVERY DAY BEFORE BREAKFAST 90 tablet 0   Omega-3 Fatty Acids (FISH OIL) 1200 MG CAPS Take 1 capsule by mouth daily.     pantoprazole (PROTONIX) 40 MG tablet TAKE 1 TABLET BY MOUTH EVERY DAY 90 tablet 0   Probiotic Product (PROBIOTIC PO) Take by mouth.     sacubitril-valsartan (ENTRESTO) 97-103 MG Take 1 tablet by mouth 2 (two) times daily. 60 tablet 11   carvedilol (COREG) 3.125 MG tablet Take 1 tablet (3.125 mg total) by mouth 2 (two) times daily. 180 tablet 3   glucose blood (ON CALL PLUS BLOOD GLUCOSE) test strip Dx DM E11.65. Check fasting blood sugar every morning and 2 hours after large meals daily. Check 3 times daily 300 each prn   Insulin Degludec (TRESIBA) 100 UNIT/ML SOLN Inject 50-70 Units into the skin at bedtime. 30 mL 1   metFORMIN (GLUCOPHAGE-XR) 500 MG 24 hr tablet TAKE 2 TABLETS BY MOUTH EVERY DAY WITH BREAKFAST 180 tablet 0   rOPINIRole (REQUIP) 3 MG tablet TAKE 1 TABLET BY MOUTH EVERY DAY 90 tablet 1   Semaglutide 7 MG TABS Take 1 tablet by mouth daily. Via patient assistance     spironolactone (ALDACTONE) 25 MG tablet Take 1 tablet (25 mg total) by mouth daily. 90 tablet 3   vitamin E 100 UNIT capsule Take 200 Units by mouth daily.     ENTRESTO 24-26 MG Take 1 tablet by mouth 2 (two) times daily.     No facility-administered medications prior to visit.    Allergies  Allergen Reactions   Bydureon [Exenatide] Nausea And Vomiting   Glipizide Other (See Comments)    Leg cramps, fatigue, and weight gain   Trulicity [Dulaglutide]  Diarrhea   Invokana [Canagliflozin] Other (See Comments)    Yeast infection   Victoza [Liraglutide] Nausea Only and Other (See Comments)    Constipation,belching,bloating    ROS Review of Systems    Objective:    Physical Exam Constitutional:      Appearance: Normal appearance.  She is well-developed.  HENT:     Head: Normocephalic and atraumatic.     Right Ear: Tympanic membrane and external ear normal.     Left Ear: Tympanic membrane and external ear normal.     Ears:     Comments: A little bit of dry skin in the left canal and wax in the right.  Nothing obstructing.    Nose: Nose normal.  Eyes:     Conjunctiva/sclera: Conjunctivae normal.  Cardiovascular:     Rate and Rhythm: Normal rate and regular rhythm.     Heart sounds: Normal heart sounds.  Pulmonary:     Effort: Pulmonary effort is normal.     Breath sounds: Normal breath sounds.  Skin:    General: Skin is warm and dry.  Neurological:     Mental Status: She is alert and oriented to person, place, and time.  Psychiatric:        Behavior: Behavior normal.     BP (!) 105/53   Pulse 63   Resp 18   Ht '5\' 2"'$  (1.575 m)   Wt 194 lb (88 kg)   SpO2 97%   BMI 35.48 kg/m  Wt Readings from Last 3 Encounters:  04/25/22 191 lb 6.4 oz (86.8 kg)  04/02/22 190 lb 1.3 oz (86.2 kg)  03/19/22 196 lb 3.2 oz (89 kg)     Health Maintenance Due  Topic Date Due   COVID-19 Vaccine (3 - Moderna series) 04/08/2020    There are no preventive care reminders to display for this patient.  Lab Results  Component Value Date   TSH 3.25 04/11/2022   Lab Results  Component Value Date   WBC 7.5 04/11/2022   HGB 13.4 04/11/2022   HCT 39.9 04/11/2022   MCV 84.4 04/11/2022   PLT 264 04/11/2022   Lab Results  Component Value Date   NA 138 02/26/2022   K 4.1 02/26/2022   CO2 24 02/26/2022   GLUCOSE 204 (H) 02/26/2022   BUN 18 02/26/2022   CREATININE 0.66 02/26/2022   BILITOT 0.9 12/25/2021   ALKPHOS 78 09/19/2016    AST 12 12/25/2021   ALT 11 12/25/2021   PROT 6.7 12/25/2021   ALBUMIN 4.3 09/19/2016   CALCIUM 9.3 02/26/2022   ANIONGAP 6 02/26/2022   Lab Results  Component Value Date   CHOL 110 12/25/2021   Lab Results  Component Value Date   HDL 40 (L) 12/25/2021   Lab Results  Component Value Date   LDLCALC 44 12/25/2021   Lab Results  Component Value Date   TRIG 188 (H) 12/25/2021   Lab Results  Component Value Date   CHOLHDL 2.8 12/25/2021   Lab Results  Component Value Date   HGBA1C 8.2 (A) 02/19/2022      Assessment & Plan:   Problem List Items Addressed This Visit       Cardiovascular and Mediastinum   BP (high blood pressure)    BP a little low today. Encourage her to hydrated today.  We will continue to monitor carefully.      Relevant Orders   AMB Referral to [facility]     Endocrine   Diabetes mellitus type 2, controlled ([place]) - Primary    A1C is 8.2 today, which is slightly better than last time at 8.5.  Good work.  But we discussed that we need to get the A1c back down to at least 7-1/2.  She was recommended to increase her [place] to 42  units but felt nervous about it so stated 40 units so we discussed going up to 42 units for now and continuing to work on healthy food choices which she struggles with she says bread is really her weakness and sometimes her husband will bring donuts home etc. and makes it really challenging to stay on track.      Relevant Orders   POCT HgB A1C (Completed)   AMB Referral to [place]   Other Visit Diagnoses     Ear itching           Ear itching-overall her ears do look okay she has a little bit of dry skin on the left ear and wax in the right.  But nothing obstructing.  We discussed that ear itching can sometimes come from seasonal allergies and also from over cleaning the ear canals were using Q-tips.  Encouraged her to decrease how often she is using her Q-tips and preferably not use them  at all.  And if that is not helping then we could certainly consider Derm otic drops.  No orders of the defined types were placed in this encounter.   Follow-up: Return in about 3 months (around 05/21/2022) for Diabetes follow-up.    Beatrice Lecher, MD

## 2022-02-19 NOTE — Assessment & Plan Note (Addendum)
BP a little low today. Encourage her to hydrated today.  We will continue to monitor carefully. ?

## 2022-02-19 NOTE — Assessment & Plan Note (Addendum)
A1C is 8.2 today, which is slightly better than last time at 8.5.  Good work.  But we discussed that we need to get the A1c back down to at least 7-1/2.  She was recommended to increase her Tresiba to 45 units but felt nervous about it so stated 40 units so we discussed going up to 42 units for now and continuing to work on healthy food choices which she struggles with she says bread is really her weakness and sometimes her husband will bring donuts home etc. and makes it really challenging to stay on track. ?

## 2022-02-23 ENCOUNTER — Telehealth: Payer: Self-pay | Admitting: *Deleted

## 2022-02-23 DIAGNOSIS — G4733 Obstructive sleep apnea (adult) (pediatric): Secondary | ICD-10-CM

## 2022-02-23 NOTE — Telephone Encounter (Signed)
-----   Message from Lauralee Evener, [place] sent at 02/09/2022  3:13 PM EDT ----- ? ?----- Message ----- ?From: Sueanne Margarita, MD ?Sent: 02/08/2022   2:11 PM EDT ?To: Cv Div Sleep Studies ? ?Please let patient know that they have sleep apnea and recommend treating with CPAP.  Please order an auto CPAP from 4-15cm H2O with heated humidity and mask of choice.  Order overnight pulse ox on CPAP.  Followup with me in 6 weeks.  ?  ? ?

## 2022-02-23 NOTE — Telephone Encounter (Signed)
The patient has been notified of the result via her mychart and informed her to call with any questions. ?Jillian Huynh, [place] 02/23/2022 11:06 PM    ?

## 2022-02-26 ENCOUNTER — Other Ambulatory Visit (HOSPITAL_COMMUNITY): Payer: Self-pay | Admitting: *Deleted

## 2022-02-26 ENCOUNTER — Ambulatory Visit (HOSPITAL_COMMUNITY)
Admission: RE | Admit: 2022-02-26 | Discharge: 2022-02-26 | Disposition: A | Discharge status: Home / Self Care | Payer: Medicare Other | Source: Ambulatory Visit | Attending: Cardiology | Admitting: Cardiology

## 2022-02-26 ENCOUNTER — Encounter (HOSPITAL_COMMUNITY): Payer: Self-pay | Admitting: Cardiology

## 2022-02-26 VITALS — BP 140/80 | HR 65 | Wt 195.0 lb

## 2022-02-26 DIAGNOSIS — I110 Hypertensive heart disease with heart failure: Secondary | ICD-10-CM | POA: Diagnosis not present

## 2022-02-26 DIAGNOSIS — I428 Other cardiomyopathies: Secondary | ICD-10-CM | POA: Diagnosis not present

## 2022-02-26 DIAGNOSIS — Z79899 Other long term (current) drug therapy: Secondary | ICD-10-CM | POA: Diagnosis not present

## 2022-02-26 DIAGNOSIS — R0600 Dyspnea, unspecified: Secondary | ICD-10-CM | POA: Insufficient documentation

## 2022-02-26 DIAGNOSIS — Z8679 Personal history of other diseases of the circulatory system: Secondary | ICD-10-CM | POA: Diagnosis not present

## 2022-02-26 DIAGNOSIS — I429 Cardiomyopathy, unspecified: Secondary | ICD-10-CM

## 2022-02-26 DIAGNOSIS — G4733 Obstructive sleep apnea (adult) (pediatric): Secondary | ICD-10-CM | POA: Diagnosis not present

## 2022-02-26 DIAGNOSIS — I5022 Chronic systolic (congestive) heart failure: Secondary | ICD-10-CM | POA: Insufficient documentation

## 2022-02-26 LAB — BASIC METABOLIC PANEL
Anion gap: 6 (ref 5–15)
BUN: 18 mg/dL (ref 8–23)
CO2: 24 mmol/L (ref 22–32)
Calcium: 9.3 mg/dL (ref 8.9–10.3)
Chloride: 108 mmol/L (ref 98–111)
Creatinine, Ser: 0.66 mg/dL (ref 0.44–1.00)
GFR, Estimated: >60 mL/min (ref >60)
Glucose, Bld: 204 mg/dL — ABNORMAL HIGH (ref 70–99)
Potassium: 4.1 mmol/L (ref 3.5–5.1)
Sodium: 138 mmol/L (ref 135–145)

## 2022-02-26 LAB — BRAIN NATRIURETIC PEPTIDE: B Natriuretic Peptide: 111.4 pg/mL — ABNORMAL HIGH (ref 0.0–100.0)

## 2022-02-26 MED ORDER — CARVEDILOL 6.25 MG PO TABS: 3.1250 mg | ORAL_TABLET | Freq: Two times a day (BID) | ORAL | 11 refills | Status: DC

## 2022-02-26 MED ORDER — CARVEDILOL 3.125 MG PO TABS: 3.1250 mg | ORAL_TABLET | Freq: Two times a day (BID) | ORAL | 11 refills | Status: DC

## 2022-02-26 NOTE — Patient Instructions (Signed)
Medication Changes: ? ?Increase Carvedilol to 6.25 mg Twice daily ? ? ?Lab Work: ? ?Labs done today, your results will be available in MyChart, we will contact you for abnormal readings. ? ? ?Testing/Procedures: ? ?Your physician has requested that you have an echocardiogram. Echocardiography is a painless test that uses sound waves to create images of your heart. It provides your doctor with information about the size and shape of your heart and how well your heart?s chambers and valves are working. This procedure takes approximately one hour. There are no restrictions for this procedure. ? ? ?REFERRALS: ? ?Please follow up with our heart failure pharmacist in 3 WEEKS  ? ? ? ?Special Instructions // Education: ? ?none ? ?Follow-Up in: 6weeks  ? ?At the [facility], you and your health needs are our priority. We have a designated team specialized in the treatment of Heart Failure. This Care Team includes your primary Heart Failure Specialized Cardiologist (physician), Advanced Practice Providers (APPs- Physician Assistants and Nurse Practitioners), and Pharmacist who all work together to provide you with the care you need, when you need it.  ? ?You may see any of the following providers on your designated Care Team at your next follow up: ? ?Dr Glori Bickers ?Dr Loralie Champagne ?Darrick Grinder, NP ?Lyda Jester, PA ?Jessica Milford,NP ?Marlyce Huge, PA ?Audry Riles, PharmD ? ? ?Please be sure to bring in all your medications bottles to every appointment.  ? ?Need to Contact us: ? ?If you have any questions or concerns before your next appointment please send [place] a message through [place] or call our office at [phone].   ? ?TO LEAVE A MESSAGE FOR THE NURSE SELECT OPTION 2, PLEASE LEAVE A MESSAGE INCLUDING: ?YOUR NAME ?DATE OF BIRTH ?CALL BACK NUMBER ?REASON FOR CALL**this is important as we prioritize the call backs ? ?YOU WILL RECEIVE A CALL BACK THE SAME DAY AS LONG AS YOU CALL BEFORE 4:00  PM ? ? ?

## 2022-02-26 NOTE — Progress Notes (Signed)
PCP: [name], MD ?Cardiology: Dr. Stanford Breed ?HF Cardiology: Dr. Aundra Dubin ? ?76 y.o. with history of LBBB, HTN, and nonischemic cardiomyopathy was referred by Dr. Stanford Breed for evaluation of CHF.  Patient reports exertional dyspnea now for a couple of years.  She was evaluated by her PCP and referred to Dr. Stanford Breed in 11/22 for dyspnea and a LBBB noted by ECG. Echo in 11/22 showed EF in the 30% range by my review with normal RV, mild AI, mild MR, and IVC normal.  Coronary CTA to follow this showed mild nonobstructive CAD.  She has no history of ETOH or drugs.  She has no family history of cardiomyopathy that she knows of.  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   ? ?Patient returns for followup of CHF.  She reports easy fatigability.  She is sleepy during the day.  Fatigue has been improved with medication adjustment.  She still gets tired with walking a long distance, but not short of breath now walking on flat ground.  She is able to walk around stores without trouble. No orthopnea/PND.   ? ?Labs (12/22): K 4.3, creatinine 0.67, LDL 71, TGs 226 ?Labs (2/23): K 4.2, creatinine 0.71 ? ?PMH: ?1. HTN ?2. Hyperlipidemia ?3. OSA: Moderate on sleep study.  ?4. Chronic systolic CHF: Nonischemic cardiomyopathy.  ?- Echo (11/22): On my review, EF 30%, normal RV size and systolic function, mild MR, mild AI, IVC normal.  ?- No h/o ETOH.  ?- Coronary CTA: 162 [place] units (72nd percentile), minimal LAD plaque.  ?5. Chronic LBBB ?6. ABIs (10/22): Normal.  ? ?Social History  ? ?Socioeconomic History  ? Marital status: Married  ?  Spouse name: Rodjer Fregeau  ? Number of children: 3  ? Years of education: 16.5  ? Highest education level: Some college, no degree  ?Occupational History  ? Occupation: Hair Stylist  ?Tobacco Use  ? Smoking status: Never  ? Smokeless tobacco: Never  ?Substance and Sexual Activity  ? Alcohol use: No  ?  Alcohol/week: 0.0 standard drinks  ? Drug use: No  ? Sexual activity: Yes   ?  Partners: Male  ?Other Topics Concern  ? Not on file  ?Social History Narrative  ? Lives with her husband. She is working full time as a Probation officer. Two of her children live close by. One is in [place]. One of her daughter's is deceased. Her hobbies include watch tv, listening to music and reading.   ? ?Social Determinants of Health  ? ?Financial Resource Strain: Low Risk   ? Difficulty of Paying Living Expenses: Not hard at all  ?Food Insecurity: No Food Insecurity  ? Worried About Charity fundraiser in the Last Year: Never true  ? Ran Out of Food in the Last Year: Never true  ?Transportation Needs: No Transportation Needs  ? Lack of Transportation (Medical): No  ? Lack of Transportation (Non-Medical): No  ?Physical Activity: Inactive  ? Days of Exercise per Week: 0 days  ? Minutes of Exercise per Session: 0 min  ?Stress: No Stress Concern Present  ? Feeling of Stress : Not at all  ?Social Connections: Moderately Integrated  ? Frequency of Communication with Friends and Family: More than three times a week  ? Frequency of Social Gatherings with Friends and Family: More than three times a week  ? Attends Religious Services: Never  ? Active Member of Clubs or Organizations: Yes  ? Attends Archivist Meetings: 1 to 4 times  per year  ? Marital Status: Married  ?Intimate Partner Violence: Not At Risk  ? Fear of Current or Ex-Partner: No  ? Emotionally Abused: No  ? Physically Abused: No  ? Sexually Abused: No  ? ?Family History  ?Problem Relation Age of Onset  ? Diabetes Father   ? Cancer Father 43  ?     Esophogeal Ca., Lung.  ? Hypertension Other   ? Thyroid disease Other   ? Colon cancer Daughter 64  ? Glaucoma Mother   ? Hepatitis C Mother 66  ?     Died from.  ? Glaucoma Sister   ? Glaucoma Sister   ? Glaucoma Maternal Aunt   ? ?ROS: All systems reviewed and negative except as per HPI.  ? ?Current Outpatient Medications  ?Medication Sig Dispense Refill  ? Apoaequorin (PREVAGEN PO) Take 1 tablet by  mouth daily.    ? atorvastatin (LIPITOR) 40 MG tablet Take 1 tablet (40 mg total) by mouth daily. 90 tablet 3  ? B-D ULTRAFINE III SHORT PEN 31G X 8 MM MISC SUB-Q Daily 100 each 4  ? Calcium Carbonate-Vit D-Min (CALCIUM 1200 PO) Take by mouth.    ? Continuous Blood Gluc Sensor (FREESTYLE LIBRE 3 SENSOR) MISC 1 each by Does not apply route every 14 (fourteen) days. Sensors to be changed every 14 days. DX E11.8 2 each 5  ? ENTRESTO 24-26 MG Take 1 tablet by mouth 2 (two) times daily.    ? ezetimibe (ZETIA) 10 MG tablet Take 1 tablet (10 mg total) by mouth daily. 90 tablet 3  ? FARXIGA 10 MG TABS tablet TAKE 1 TABLET BY MOUTH EVERY DAY BEFORE BREAKFAST 90 tablet 0  ? glucose blood (ON CALL PLUS BLOOD GLUCOSE) test strip Dx DM E11.65. Check fasting blood sugar every morning and 2 hours after large meals daily. Check 3 times daily 300 each prn  ? Insulin Degludec (TRESIBA  [place]) Inject 42 Units into the skin daily.    ? metFORMIN (GLUCOPHAGE) 500 MG tablet Take 500 mg by mouth 2 (two) times daily with a meal.    ? Omega-3 Fatty Acids (FISH OIL) 1200 MG CAPS Take 1 capsule by mouth daily.    ? pantoprazole (PROTONIX) 40 MG tablet TAKE 1 TABLET BY MOUTH EVERY DAY 90 tablet 0  ? Probiotic Product (PROBIOTIC PO) Take by mouth.    ? rOPINIRole (REQUIP) 3 MG tablet TAKE 1 TABLET BY MOUTH EVERY DAY 90 tablet 1  ? sacubitril-valsartan (ENTRESTO) 97-103 MG Take 1 tablet by mouth 2 (two) times daily. 60 tablet 11  ? Semaglutide 7 MG TABS Take 1 tablet by mouth daily. Via patient assistance    ? spironolactone (ALDACTONE) 25 MG tablet Take 1 tablet (25 mg total) by mouth daily. 90 tablet 3  ? vitamin E 100 UNIT capsule Take 200 Units by mouth daily.    ? carvedilol (COREG) 3.125 MG tablet Take 1 tablet (3.125 mg total) by mouth 2 (two) times daily. 60 tablet 11  ? ?No current facility-administered medications for this encounter.  ? ?BP 140/80   Pulse 65   Wt 88.5 kg (195 lb)   SpO2 97%   BMI 35.67 kg/m?  ?General: NAD ?Neck: No  JVD, no thyromegaly or thyroid nodule.  ?Lungs: Clear to auscultation bilaterally with normal respiratory effort. ?CV: Nondisplaced PMI.  Heart regular S1/S2, no S3/S4, no murmur.  No peripheral edema.  No carotid bruit.  Normal pedal pulses.  ?Abdomen: Soft, nontender, no hepatosplenomegaly, no  distention.  ?Skin: Intact without lesions or rashes.  ?Neurologic: Alert and oriented x 3.  ?Psych: Normal affect. ?Extremities: No clubbing or cyanosis.  ?HEENT: Normal.  ? ?Assessment/Plan: ?1. Chronic systolic CHF: Nonischemic cardiomyopathy.  Coronary CTA in 11/22 with mild nonobstructive CAD.  Echo in 11/22 showed on my review EF 30%, normal RV size and systolic function, mild MR, mild AI, IVC normal. No ETOH.  No FH of cardiomyopathy.  No arrhythmias have been noted.  She may have a LBBB cardiomyopathy.  NYHA class II symptoms, some improvement.  She is not volume overloaded on exam today.  ?- Increase Coreg to 6.25 mg bid.  ?- Continue dapagliflozin 10 mg daily.  ?- Continue spironolactone 25 mg daily, BMET/BNP today.  ?- Continue Entresto 97/103 bid.   ?- Repeat echo in 5/22, if EF remains low will need to consider CRT-D.  ?- She does not think she could tolerate a cardiac MRI due to claustrophobia.  ?2. HTN: Medication change as above.  ?3. OSA: Moderate OSA, waiting for CPAP.  ? ?Followup with HF pharmacist in 3 wks for med titration.  See me in 6 months with echo. ? ?Jillian Huynh ?02/26/2022 ? ?

## 2022-02-27 NOTE — Telephone Encounter (Signed)
The encounter on 4/14 at 11:06 was created I error. Patient was called today and given her result. ?The patient has been notified of the result and verbalized understanding.  All questions (if any) were answered. ?Marolyn Hammock, [place] 02/27/2022 12:29 PM   ? ?Upon patient request DME selection is [facility] ?Patient understands he will be contacted by [facility] to set up his cpap. ?Patient understands to call if [facility] does not contact him with new setup in a timely manner. ?Patient understands they will be called once confirmation has been received from [facility]/ that they have received their new machine to schedule 10 week follow up appointment. ?  ?Noel notified of new cpap order  ?Please add to airview ?Patient was grateful for the call and thanked me.  ? ? ?

## 2022-03-02 ENCOUNTER — Emergency Department
Admission: EM | Admit: 2022-03-02 | Discharge: 2022-03-02 | Disposition: A | Discharge status: Home / Self Care | Payer: Medicare Other | Source: Home / Self Care

## 2022-03-02 DIAGNOSIS — H6693 Otitis media, unspecified, bilateral: Secondary | ICD-10-CM

## 2022-03-02 MED ORDER — AMOXICILLIN-POT CLAVULANATE 875-125 MG PO TABS
1.0000 | ORAL_TABLET | Freq: Two times a day (BID) | ORAL | 0 refills | Status: AC
Start: 2022-03-02 — End: 2022-03-12

## 2022-03-02 NOTE — Discharge Instructions (Addendum)
Advised patient to take medication as directed with food to completion.  Encouraged patient to increase daily water intake while taking this medication.  Advised patient if symptoms worsen and/or unresolved please follow-up with PCP or here for further evaluation. 

## 2022-03-02 NOTE — ED Provider Notes (Signed)
?[facility] ? ? ? ?CSN: [id number] ?Arrival date & time: 03/02/22  1344 ? ? ?  ? ?History   ?Chief Complaint ?Chief Complaint  ?Patient presents with  ? Ear Fullness  ? ? ?HPI ?Jillian Huynh is a 76 y.o. female.  ? ?HPI Pleasant 76 year old female presents with ear fullness for 2 days.  PMH significant for chronic systolic heart failure, HTN, T2DM, and obesity. ? ?Past Medical History:  ?Diagnosis Date  ? Diabetes mellitus without complication ([place])   ? Hyperlipidemia   ? Hypertension   ? ? ?Patient Active Problem List  ? Diagnosis Date Noted  ? Left bundle branch block (LBBB) determined by electrocardiography 08/08/2021  ? Gastroesophageal reflux disease without esophagitis 09/19/2020  ? Severe obesity (BMI 35.0-39.9) with comorbidity ([place]) 05/25/2019  ? Fatty liver 12/25/2017  ? BP (high blood pressure) 03/28/2015  ? Dyslipidemia 03/28/2015  ? Insomnia 03/28/2015  ? Restless leg 03/28/2015  ? Mass of arm 03/14/2015  ? Diabetes mellitus type 2, controlled ([place]) 03/14/2015  ? Obstructive apnea 09/03/2012  ? HYPERKERATOSIS 04/01/2009  ? ? ?Past Surgical History:  ?Procedure Laterality Date  ? CHOLECYSTECTOMY    ? ? ?OB History   ?No obstetric history on file. ?  ? ? ? ?Home Medications   ? ?Prior to Admission medications   ?Medication Sig Start Date End Date Taking? Authorizing Provider  ?amoxicillin-clavulanate (AUGMENTIN) 875-125 MG tablet Take 1 tablet by mouth 2 (two) times daily for 10 days. 03/02/22 03/12/22 Yes Eliezer Lofts, FNP  ?Apoaequorin (PREVAGEN PO) Take 1 tablet by mouth daily.    [provider]  ?atorvastatin (LIPITOR) 40 MG tablet Take 1 tablet (40 mg total) by mouth daily. 10/16/21   Lelon Perla, MD  ?B-D ULTRAFINE III SHORT PEN 31G X 8 MM MISC SUB-Q Daily 01/15/22   [name], MD  ?Calcium Carbonate-Vit D-Min (CALCIUM 1200 PO) Take by mouth.    [provider]  ?carvedilol (COREG) 3.125 MG tablet Take 1 tablet (3.125 mg total) by mouth 2 (two) times daily.  02/26/22   Larey Dresser, MD  ?Continuous Blood Gluc Sensor (FREESTYLE LIBRE 3 SENSOR) MISC 1 each by Does not apply route every 14 (fourteen) days. Sensors to be changed every 14 days. DX E11.8 09/29/21   [name], MD  ?ENTRESTO 24-26 MG Take 1 tablet by mouth 2 (two) times daily. 12/13/21   [provider]  ?ezetimibe (ZETIA) 10 MG tablet Take 1 tablet (10 mg total) by mouth daily. 10/25/21   Lelon Perla, MD  ?FARXIGA 10 MG TABS tablet TAKE 1 TABLET BY MOUTH EVERY DAY BEFORE BREAKFAST 01/03/22   [name], MD  ?glucose blood (ON CALL PLUS BLOOD GLUCOSE) test strip Dx DM E11.65. Check fasting blood sugar every morning and 2 hours after large meals daily. Check 3 times daily 02/08/21   [name], MD  ?Insulin Degludec (TRESIBA [place]) Inject 42 Units into the skin daily.    [provider]  ?metFORMIN (GLUCOPHAGE) 500 MG tablet Take 500 mg by mouth 2 (two) times daily with a meal.    [provider]  ?Omega-3 Fatty Acids (FISH OIL) 1200 MG CAPS Take 1 capsule by mouth daily.    [provider]  ?pantoprazole (PROTONIX) 40 MG tablet TAKE 1 TABLET BY MOUTH EVERY DAY 01/03/22   [name], MD  ?Probiotic Product (PROBIOTIC PO) Take by mouth.    [provider]  ?rOPINIRole (REQUIP) 3 MG tablet  TAKE 1 TABLET BY MOUTH EVERY DAY 10/25/21   [name], MD  ?sacubitril-valsartan (ENTRESTO) 97-103 MG Take 1 tablet by mouth 2 (two) times daily. 01/01/22   Larey Dresser, MD  ?Semaglutide 7 MG TABS Take 1 tablet by mouth daily. Via patient assistance    [provider]  ?spironolactone (ALDACTONE) 25 MG tablet Take 1 tablet (25 mg total) by mouth daily. 01/24/22   Larey Dresser, MD  ?vitamin E 100 UNIT capsule Take 200 Units by mouth daily.    [provider]  ? ? ?Family History ?Family History  ?Problem Relation Age of Onset  ? Diabetes Father   ? Cancer Father 15  ?     Esophogeal Ca., Lung.  ?  Hypertension Other   ? Thyroid disease Other   ? Colon cancer Daughter 89  ? Glaucoma Mother   ? Hepatitis C Mother 85  ?     Died from.  ? Glaucoma Sister   ? Glaucoma Sister   ? Glaucoma Maternal Aunt   ? ? ?Social History ?Social History  ? ?Tobacco Use  ? Smoking status: Never  ? Smokeless tobacco: Never  ?Substance Use Topics  ? Alcohol use: No  ?  Alcohol/week: 0.0 standard drinks  ? Drug use: No  ? ? ? ?Allergies   ?Bydureon [exenatide], Glipizide, Trulicity [dulaglutide], Invokana [canagliflozin], and Victoza [liraglutide] ? ? ?Review of Systems ?Review of Systems  ?HENT:  Positive for ear pain.   ? ? ?Physical Exam ?Triage Vital Signs ?ED Triage Vitals  ?Enc Vitals Group  ?   BP   ?   Pulse   ?   Resp   ?   Temp   ?   Temp src   ?   SpO2   ?   Weight   ?   Height   ?   Head Circumference   ?   Peak Flow   ?   Pain Score   ?   Pain Loc   ?   Pain Edu?   ?   Excl. in [place]?   ? ?No data found. ? ?Updated Vital Signs ?BP 112/70 (BP Location: Left Arm)   Pulse 65   Temp 98 ?F (36.7 ?C) (Oral)   Resp 16   SpO2 98%  ? ? ? ?Physical Exam ?Vitals and nursing note reviewed.  ?Constitutional:   ?   General: She is not in acute distress. ?   Appearance: Normal appearance. She is obese. She is not ill-appearing.  ?HENT:  ?   Head: Normocephalic and atraumatic.  ?   Right Ear: External ear normal.  ?   Left Ear: External ear normal.  ?   Ears:  ?   Comments: Mild cerumen accumulation noted proximal to bilateral TM's; TM's are red rimmed retracted bilaterally; post bilateral ear lavage: EAC's clear bilaterally; TM's-red rimmed erythematous with scant serous effusions noted of left TM. ?   Mouth/Throat:  ?   Mouth: Mucous membranes are moist.  ?   Pharynx: Oropharynx is clear.  ?Eyes:  ?   Extraocular Movements: Extraocular movements intact.  ?   Conjunctiva/sclera: Conjunctivae normal.  ?   Pupils: Pupils are equal, round, and reactive to light.  ?Cardiovascular:  ?   Rate and Rhythm: Normal rate and regular rhythm.  ?    Pulses: Normal pulses.  ?   Heart sounds: Normal heart sounds.  ?Pulmonary:  ?   Effort: Pulmonary effort is normal.  ?  Breath sounds: Normal breath sounds.  ?Musculoskeletal:  ?   Cervical back: Normal range of motion and neck supple.  ?Skin: ?   General: Skin is warm and dry.  ?Neurological:  ?   General: No focal deficit present.  ?   Mental Status: She is alert and oriented to person, place, and time.  ? ? ? ?UC Treatments / Results  ?Labs ?(all labs ordered are listed, but only abnormal results are displayed) ?Labs Reviewed - No data to display ? ?EKG ? ? ?Radiology ?No results found. ? ?Procedures ?Procedures (including critical care time) ? ?Medications Ordered in UC ?Medications - No data to display ? ?Initial Impression / Assessment and Plan / UC Course  ?I have reviewed the triage vital signs and the nursing notes. ? ?Pertinent labs & imaging results that were available during my care of the patient were reviewed by me and considered in my medical decision making (see chart for details). ? ?  ? ?MDM: 1.  Acute bilateral otitis media-Rx'd Augmentin. Advised patient to take medication as directed with food to completion.  Encouraged patient to increase daily water intake while taking this medication.  Advised patient if symptoms worsen and/or unresolved please follow-up with PCP or here for further evaluation.  Patient discharged home, hemodynamically stable. ?Final Clinical Impressions(s) / UC Diagnoses  ? ?Final diagnoses:  ?Acute bilateral otitis media  ? ? ? ?Discharge Instructions   ? ?  ?Advised patient to take medication as directed with food to completion.  Encouraged patient to increase daily water intake while taking this medication.  Advised patient if symptoms worsen and/or unresolved please follow-up with PCP or here for further evaluation. ? ? ? ? ?ED Prescriptions   ? ? Medication Sig Dispense Auth. Provider  ? amoxicillin-clavulanate (AUGMENTIN) 875-125 MG tablet Take 1 tablet by mouth 2  (two) times daily for 10 days. 20 tablet Eliezer Lofts, FNP  ? ?  ? ?PDMP not reviewed this encounter. ?  ?Eliezer Lofts, [place] ?03/02/22 1415 ? ?

## 2022-03-02 NOTE — ED Triage Notes (Signed)
Pt presents with c/o ear fullness ?

## 2022-03-06 ENCOUNTER — Other Ambulatory Visit (HOSPITAL_COMMUNITY): Payer: Self-pay | Admitting: Cardiology

## 2022-03-14 ENCOUNTER — Other Ambulatory Visit: Payer: Self-pay | Admitting: Family Medicine

## 2022-03-14 DIAGNOSIS — E1165 Type 2 diabetes mellitus with hyperglycemia: Secondary | ICD-10-CM

## 2022-03-16 ENCOUNTER — Emergency Department
Admission: EM | Admit: 2022-03-16 | Discharge: 2022-03-16 | Disposition: A | Discharge status: Home / Self Care | Payer: Medicare Other | Source: Home / Self Care

## 2022-03-16 DIAGNOSIS — H918X3 Other specified hearing loss, bilateral: Secondary | ICD-10-CM

## 2022-03-16 NOTE — ED Provider Notes (Signed)
?[facility] ? ? ? ?CSN: [id number] ?Arrival date & time: 03/16/22  1524 ? ? ?  ? ?History   ?Chief Complaint ?Chief Complaint  ?Patient presents with  ? Ear Fullness  ? ? ?HPI ?Jillian Huynh is a 76 y.o. female.  ? ?HPI 76 year old female presents with bilateral ear fullness and hearing loss for 10-14 days.  Patient evaluated by me on 03/02/2022 found to have bilateral otitis media and was treated with Augmentin.  Patient returns today with similar symptoms.  PMH significant for obesity, LBBB, and T2DM. ? ?Past Medical History:  ?Diagnosis Date  ? Diabetes mellitus without complication ([place])   ? Hyperlipidemia   ? Hypertension   ? ? ?Patient Active Problem List  ? Diagnosis Date Noted  ? Left bundle branch block (LBBB) determined by electrocardiography 08/08/2021  ? Gastroesophageal reflux disease without esophagitis 09/19/2020  ? Severe obesity (BMI 35.0-39.9) with comorbidity ([place]) 05/25/2019  ? Fatty liver 12/25/2017  ? BP (high blood pressure) 03/28/2015  ? Dyslipidemia 03/28/2015  ? Insomnia 03/28/2015  ? Restless leg 03/28/2015  ? Mass of arm 03/14/2015  ? Diabetes mellitus type 2, controlled ([place]) 03/14/2015  ? Obstructive apnea 09/03/2012  ? HYPERKERATOSIS 04/01/2009  ? ? ?Past Surgical History:  ?Procedure Laterality Date  ? CHOLECYSTECTOMY    ? ? ?OB History   ?No obstetric history on file. ?  ? ? ? ?Home Medications   ? ?Prior to Admission medications   ?Medication Sig Start Date End Date Taking? Authorizing Provider  ?Apoaequorin (PREVAGEN PO) Take 1 tablet by mouth daily.    [provider]  ?atorvastatin (LIPITOR) 40 MG tablet Take 1 tablet (40 mg total) by mouth daily. 10/16/21   Lelon Perla, MD  ?B-D ULTRAFINE III SHORT PEN 31G X 8 MM MISC SUB-Q Daily 01/15/22   [name], MD  ?Calcium Carbonate-Vit D-Min (CALCIUM 1200 PO) Take by mouth.    [provider]  ?carvedilol (COREG) 3.125 MG tablet Take 1 tablet (3.125 mg total) by mouth 2 (two) times daily. 02/26/22    Larey Dresser, MD  ?Continuous Blood Gluc Sensor (FREESTYLE LIBRE 3 SENSOR) MISC 1 each by Does not apply route every 14 (fourteen) days. Sensors to be changed every 14 days. DX E11.8 09/29/21   [name], MD  ?ENTRESTO 24-26 MG Take 1 tablet by mouth 2 (two) times daily. 12/13/21   [provider]  ?ezetimibe (ZETIA) 10 MG tablet Take 1 tablet (10 mg total) by mouth daily. 10/25/21   Lelon Perla, MD  ?FARXIGA 10 MG TABS tablet TAKE 1 TABLET BY MOUTH EVERY DAY BEFORE BREAKFAST 01/03/22   [name], MD  ?Insulin Degludec (TRESIBA [place]) Inject 42 Units into the skin daily.    [provider]  ?metFORMIN (GLUCOPHAGE) 500 MG tablet Take 500 mg by mouth 2 (two) times daily with a meal.    [provider]  ?Omega-3 Fatty Acids (FISH OIL) 1200 MG CAPS Take 1 capsule by mouth daily.    [provider]  ?Roma Schanz test strip DX DM E11.65. CHECK FASTING BLOOD SUGAR EVERY MORNING AND 2 HOURS AFTER LARGE MEALS DAILY. CHECK 3 TIMES DAILY 03/14/22   [name], MD  ?pantoprazole (PROTONIX) 40 MG tablet TAKE 1 TABLET BY MOUTH EVERY DAY 01/03/22   [name], MD  ?Probiotic Product (PROBIOTIC PO) Take by mouth.    [provider]  ?rOPINIRole (REQUIP) 3 MG tablet TAKE 1 TABLET BY MOUTH  EVERY DAY 10/25/21   [name], MD  ?sacubitril-valsartan (ENTRESTO) 97-103 MG Take 1 tablet by mouth 2 (two) times daily. 01/01/22   Larey Dresser, MD  ?Semaglutide 7 MG TABS Take 1 tablet by mouth daily. Via patient assistance    [provider]  ?spironolactone (ALDACTONE) 25 MG tablet Take 1 tablet (25 mg total) by mouth daily. 01/24/22   Larey Dresser, MD  ?vitamin E 100 UNIT capsule Take 200 Units by mouth daily.    [provider]  ? ? ?Family History ?Family History  ?Problem Relation Age of Onset  ? Diabetes Father   ? Cancer Father 31  ?     Esophogeal Ca., Lung.  ? Hypertension Other   ? Thyroid disease Other    ? Colon cancer Daughter 71  ? Glaucoma Mother   ? Hepatitis C Mother 19  ?     Died from.  ? Glaucoma Sister   ? Glaucoma Sister   ? Glaucoma Maternal Aunt   ? ? ?Social History ?Social History  ? ?Tobacco Use  ? Smoking status: Never  ? Smokeless tobacco: Never  ?Substance Use Topics  ? Alcohol use: No  ?  Alcohol/week: 0.0 standard drinks  ? Drug use: No  ? ? ? ?Allergies   ?Bydureon [exenatide], Glipizide, Trulicity [dulaglutide], Invokana [canagliflozin], and Victoza [liraglutide] ? ? ?Review of Systems ?Review of Systems  ?HENT:    ?     Bilateral hearing loss for 10 to 14 days  ?All other systems reviewed and are negative. ? ? ?Physical Exam ?Triage Vital Signs ?ED Triage Vitals  ?Enc Vitals Group  ?   BP 03/16/22 1531 131/76  ?   Pulse Rate 03/16/22 1531 62  ?   Resp 03/16/22 1531 14  ?   Temp 03/16/22 1531 98 ?F (36.7 ?C)  ?   Temp Source 03/16/22 1531 Oral  ?   SpO2 03/16/22 1531 97 %  ?   Weight --   ?   Height --   ?   Head Circumference --   ?   Peak Flow --   ?   Pain Score 03/16/22 1533 0  ?   Pain Loc --   ?   Pain Edu? --   ?   Excl. in [place]? --   ? ?No data found. ? ?Updated Vital Signs ?BP 131/76 (BP Location: Right Arm)   Pulse 62   Temp 98 ?F (36.7 ?C) (Oral)   Resp 14   SpO2 97%  ? ? ?Physical Exam ?Vitals and nursing note reviewed.  ?Constitutional:   ?   General: She is not in acute distress. ?   Appearance: Normal appearance. She is obese. She is not ill-appearing.  ?HENT:  ?   Head: Normocephalic and atraumatic.  ?   Right Ear: Ear canal and external ear normal.  ?   Left Ear: Ear canal and external ear normal.  ?   Ears:  ?   Comments: Bilateral TM's are clear with dull light reflex noted ?   Mouth/Throat:  ?   Mouth: Mucous membranes are moist.  ?   Pharynx: Oropharynx is clear.  ?Eyes:  ?   Extraocular Movements: Extraocular movements intact.  ?   Conjunctiva/sclera: Conjunctivae normal.  ?   Pupils: Pupils are equal, round, and reactive to light.  ?Cardiovascular:  ?   Rate and  Rhythm: Normal rate and regular rhythm.  ?   Pulses: Normal pulses.  ?  Heart sounds: Normal heart sounds. No murmur heard. ?Pulmonary:  ?   Effort: Pulmonary effort is normal.  ?   Breath sounds: Normal breath sounds. No wheezing, rhonchi or rales.  ?Musculoskeletal:  ?   Cervical back: Normal range of motion and neck supple.  ?Skin: ?   General: Skin is warm and dry.  ?Neurological:  ?   General: No focal deficit present.  ?   Mental Status: She is alert and oriented to person, place, and time. Mental status is at baseline.  ? ? ? ?UC Treatments / Results  ?Labs ?(all labs ordered are listed, but only abnormal results are displayed) ?Labs Reviewed - No data to display ? ?EKG ? ? ?Radiology ?No results found. ? ?Procedures ?Procedures (including critical care time) ? ?Medications Ordered in UC ?Medications - No data to display ? ?Initial Impression / Assessment and Plan / UC Course  ?I have reviewed the triage vital signs and the nursing notes. ? ?Pertinent labs & imaging results that were available during my care of the patient were reviewed by me and considered in my medical decision making (see chart for details). ? ?  ? ?MDM: 1.  Other specified hearing loss of both ears-Advised patient that anatomically external ears are clear free of cerumen eardrums are clear with dull light reflex.  Advised patient to follow-up with PCP for referral to audiology for hearing check.  Patient discharged home, hemodynamically stable. ?Final Clinical Impressions(s) / UC Diagnoses  ? ?Final diagnoses:  ?Other specified hearing loss of both ears  ? ? ? ?Discharge Instructions   ? ?  ?Advised patient that anatomically external ears are clear free of cerumen eardrums are clear with dull light reflex.  Advised patient to follow-up with PCP for referral to audiology for hearing check. ? ? ? ? ?ED Prescriptions   ?None ?  ? ?PDMP not reviewed this encounter. ?  ?Eliezer Lofts, [place] ?03/16/22 1553 ? ?

## 2022-03-16 NOTE — Progress Notes (Incomplete)
***In Progress*** ? ?  ?Advanced Heart Failure Clinic Note  ?PCP: Jillian Marry, MD ?Cardiology: Dr. Stanford Huynh ?HF Cardiology: Dr. Aundra Huynh ? ?HPI:  ?76 y.o. with history of LBBB, HTN, and nonischemic cardiomyopathy was referred by Dr. Stanford Huynh for evaluation of CHF.  Patient reports exertional dyspnea now for a couple of years.  She was evaluated by her PCP and referred to Dr. Stanford Huynh in 09/2021 for dyspnea and a LBBB noted by ECG. Echo in 09/2021 showed EF in the 30% range by my review with normal RV, mild AI, mild MR, and IVC normal.  Coronary CTA to follow this showed mild nonobstructive CAD. She has no history of ETOH or drugs.  She has no family history of cardiomyopathy that she knows of.  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   ?  ?Patient returned for followup of CHF on 02/26/2022.  She reported easy fatigability and feeling sleepy during the day. Fatigue had improved with medication adjustment.  She still was getting tired walking a long distance, but denied shortness of breath now walking on flat ground.  She was able to walk around stores without trouble. Denied orthopnea/PND.   ? ?Since then she presented to urgent care x 2 for bilateral ear fullness and found to have bilateral otitis media treated with Augmentin and instructed to follow-up with her PCP.  ?  ?Labs (12/22): K 4.3, creatinine 0.67, LDL 71, TGs 226 ?Labs (2/23): K 4.2, creatinine 0.71 ? ?Today he returns to HF clinic for pharmacist medication titration. At last visit with MD carvedilol was increased from 3.125 to 6.25 twice daily. *** (med list and dispense report say 3.125 bid) ? ?Overall feeling ***. ?Dizziness, lightheadedness, fatigue:  ?Chest pain or palpitations: ? ?How is your breathing?: *** ?SOB: ?Able to complete all ADLs. Activity level *** ? ?Weight at home pounds. ?LEE ?PND/Orthopnea ? ?Appetite *** ?Low-salt diet:  ? ?Physical Exam ?Cost/affordability of meds  ? ?HF Medications: ?Carvedilol 3.125 mg BID  vs. 6.25 mg *** ?Entresto 97/103 mg BID ?Spironolactone 25 mg daily ?Farxiga 10 mg daily ? ?Has the patient been experiencing any side effects to the medications prescribed?  {YES NO:22349} ? ?Does the patient have any problems obtaining medications due to transportation or finances?  ?[place] Medicare. PAN grant approved for Entresto/Farxiga ?Gets patient assistance for semaglutide via another office. ? ?Understanding of regimen: {excellent/good/fair/poor:19665} ?Understanding of indications: {excellent/good/fair/poor:19665} ?Potential of compliance: {excellent/good/fair/poor:19665} ?Patient understands to avoid NSAIDs. ?Patient understands to avoid decongestants. ?  ? ?Pertinent Lab Values: ?Labs 02/26/2022: Serum creatinine 0.66, BUN 18, Potassium 4.1, Sodium 138, BNP 111.4 ? ?Vital Signs: ?Weight: *** (last clinic weight: 195 lbs) ?Blood pressure: *** 140/80 variable urgent care 131/76, 112/70, 105/53 ?Heart rate: *** 65 stable mid to low 60s ? ?Plan  ?No labs ?Clarify carvedilol dose ?If at 3.125 mg inc to 6.25 bid as long as no tolerability issues in the past ?Ortherwise no changes ? ?Assessment/Plan: ?1. Chronic systolic CHF: Nonischemic cardiomyopathy.  Coronary CTA in 09/2021 with mild nonobstructive CAD.  Echo in 09/2021 indicated EF 30%, normal RV size and systolic function, mild MR, mild AI, IVC normal. No ETOH.  No FH of cardiomyopathy.  No arrhythmias have been noted.  She may have a LBBB cardiomyopathy.   ?NYHA class II symptoms, some improvement.  She is not volume overloaded on exam today. *** ?- Continue carvedilol 6.25 mg bid *** ?- Continue Entresto 97/103 bid  ?- Continue spironolactone 25 mg daily ?- Continue dapagliflozin 10  mg daily ?- Repeat echo in 03/2021, if EF remains low considering CRT-D.  ?- She does not think she could tolerate a cardiac MRI due to claustrophobia.  ?2. HTN: Medication change as above. *** ?3. OSA: Moderate OSA, waiting for CPAP.  ? ?Follow up 5 months Dr. Aundra Huynh for  echo. ? ? ?Jillian Huynh, PharmD, BCPS, BCCP, CPP ?Heart Failure Clinic Pharmacist ?[phone] ?  ?

## 2022-03-16 NOTE — Discharge Instructions (Addendum)
Advised patient that anatomically external ears are clear free of cerumen eardrums are clear with dull light reflex.  Advised patient to follow-up with PCP for referral to audiology for hearing check. ?

## 2022-03-16 NOTE — ED Triage Notes (Signed)
Pt presents with continued bilateral ear fullness ?

## 2022-03-19 ENCOUNTER — Encounter (HOSPITAL_COMMUNITY): Payer: Self-pay

## 2022-03-19 ENCOUNTER — Ambulatory Visit (HOSPITAL_COMMUNITY)
Admission: RE | Admit: 2022-03-19 | Discharge: 2022-03-19 | Disposition: A | Discharge status: Home / Self Care | Payer: Medicare Other | Source: Ambulatory Visit | Attending: Internal Medicine | Admitting: Internal Medicine

## 2022-03-19 VITALS — BP 140/90 | HR 65 | Wt 196.2 lb

## 2022-03-19 DIAGNOSIS — I2510 Atherosclerotic heart disease of native coronary artery without angina pectoris: Secondary | ICD-10-CM | POA: Diagnosis not present

## 2022-03-19 DIAGNOSIS — I429 Cardiomyopathy, unspecified: Secondary | ICD-10-CM | POA: Diagnosis not present

## 2022-03-19 DIAGNOSIS — I428 Other cardiomyopathies: Secondary | ICD-10-CM | POA: Insufficient documentation

## 2022-03-19 DIAGNOSIS — I110 Hypertensive heart disease with heart failure: Secondary | ICD-10-CM | POA: Insufficient documentation

## 2022-03-19 DIAGNOSIS — I447 Left bundle-branch block, unspecified: Secondary | ICD-10-CM | POA: Insufficient documentation

## 2022-03-19 DIAGNOSIS — I5022 Chronic systolic (congestive) heart failure: Secondary | ICD-10-CM | POA: Diagnosis not present

## 2022-03-19 DIAGNOSIS — R0600 Dyspnea, unspecified: Secondary | ICD-10-CM | POA: Insufficient documentation

## 2022-03-19 MED ORDER — SPIRONOLACTONE 25 MG PO TABS: 50.0000 mg | ORAL_TABLET | Freq: Every day | ORAL | 3 refills | Status: DC

## 2022-03-19 MED ORDER — CARVEDILOL 6.25 MG PO TABS: 6.2500 mg | ORAL_TABLET | Freq: Two times a day (BID) | ORAL | 3 refills | Status: DC

## 2022-03-19 MED ORDER — SPIRONOLACTONE 50 MG PO TABS: 50.0000 mg | ORAL_TABLET | Freq: Every day | ORAL | 3 refills | Status: DC

## 2022-03-19 NOTE — Patient Instructions (Addendum)
It was a pleasure seeing you today! ? ?MEDICATIONS: ?-We are changing your medications today ?-Increase spironolactone to 50 mg (2 tablets) daily. A prescription for 1 tablet daily has been sent to the pharmacy to begin once you run out of your current supply of 25 mg. ?-Call if you have questions about your medications. ? ?NEXT APPOINTMENT: ?Return to clinic in 05/14/22 with Dr. Aundra Dubin. ? ?In general, to take care of your heart failure: ?-Limit your fluid intake to 2 Liters (half-gallon) per day.   ?-Limit your salt intake to ideally 2-3 grams (2000-3000 mg) per day. ?-Weigh yourself daily and record, and bring that "weight diary" to your next appointment.  (Weight gain of 2-3 pounds in 1 day typically means fluid weight.) ?-The medications for your heart are to help your heart and help you live longer.   ?-Please contact us before stopping any of your heart medications. ? ?Call the clinic at [phone] with questions or to reschedule future appointments.  ?

## 2022-03-19 NOTE — Progress Notes (Signed)
HPI:FU CHF.  ABIs October 2022 normal.  Echocardiogram November 2022 showed septal hypokinesis, ejection fraction 45 to 50%, moderate left ventricular hypertrophy, grade 1 diastolic dysfunction, moderate mitral regurgitation, mild aortic insufficiency.  I personally reviewed the patient's echocardiogram and feel the ejection fraction is approximately 30%.  The mitral regurgitation appears to be mild.  Coronary CTA November 2022 showed calcium score 162 which was 70th percentile, minimal calcified plaque in the LAD and circumflex.  Since last seen, patient complains of severe fatigue.  She has mild dyspnea with more vigorous activities.  No orthopnea, PND, pedal edema, chest pain or syncope.  Current Outpatient Medications  Medication Sig Dispense Refill   Apoaequorin (PREVAGEN PO) Take 1 tablet by mouth daily.     atorvastatin (LIPITOR) 40 MG tablet Take 1 tablet (40 mg total) by mouth daily. 90 tablet 3   B-D ULTRAFINE III SHORT PEN 31G X 8 MM MISC SUB-Q Daily 100 each 4   Calcium Carbonate-Vit D-Min (CALCIUM 1200 PO) Take 1 tablet by mouth in the morning and at bedtime.     Continuous Blood Gluc Sensor (FREESTYLE LIBRE 3 SENSOR) MISC 1 each by Does not apply route every 14 (fourteen) days. Sensors to be changed every 14 days. DX E11.8 2 each 5   ezetimibe (ZETIA) 10 MG tablet Take 1 tablet (10 mg total) by mouth daily. 90 tablet 3   FARXIGA 10 MG TABS tablet TAKE 1 TABLET BY MOUTH EVERY DAY BEFORE BREAKFAST 90 tablet 0   Insulin Degludec (TRESIBA [place]) Inject 42 Units into the skin daily.     Omega-3 Fatty Acids (FISH OIL) 1200 MG CAPS Take 1 capsule by mouth daily.     ONETOUCH VERIO test strip DX DM E11.65. CHECK FASTING BLOOD SUGAR EVERY MORNING AND 2 HOURS AFTER LARGE MEALS DAILY. CHECK 3 TIMES DAILY 300 strip PRN   pantoprazole (PROTONIX) 40 MG tablet TAKE 1 TABLET BY MOUTH EVERY DAY 90 tablet 0   Probiotic Product (PROBIOTIC PO) Take by mouth.     rOPINIRole (REQUIP) 3 MG tablet TAKE  1 TABLET BY MOUTH EVERY DAY 90 tablet 1   sacubitril-valsartan (ENTRESTO) 97-103 MG Take 1 tablet by mouth 2 (two) times daily. 60 tablet 11   Semaglutide (RYBELSUS) 14 MG TABS Take 1 tablet by mouth daily.     spironolactone (ALDACTONE) 50 MG tablet Take 1 tablet (50 mg total) by mouth daily. 90 tablet 3   carvedilol (COREG) 6.25 MG tablet Take 1 tablet (6.25 mg total) by mouth 2 (two) times daily. (Patient not taking: Reported on 04/02/2022) 180 tablet 3   metFORMIN (GLUCOPHAGE) 500 MG tablet Take 500 mg by mouth 2 (two) times daily with a meal. (Patient not taking: Reported on 04/02/2022)     No current facility-administered medications for this visit.     Past Medical History:  Diagnosis Date   Diabetes mellitus without complication ([place])    Hyperlipidemia    Hypertension     Past Surgical History:  Procedure Laterality Date   CHOLECYSTECTOMY      Social History   Socioeconomic History   Marital status: Married    Spouse name: Biochemist, clinical   Number of children: 3   Years of education: 16.5   Highest education level: Some college, no degree  Occupational History   Occupation: Hair Stylist  Tobacco Use   Smoking status: Never   Smokeless tobacco: Never  Substance and Sexual Activity   Alcohol use: No  Alcohol/week: 0.0 standard drinks   Drug use: No   Sexual activity: Yes    Partners: Male  Other Topics Concern   Not on file  Social History Narrative   Lives with her husband. She is working full time as a Probation officer. Two of her children live close by. One is in [place]. One of her daughter's is deceased. Her hobbies include watch tv, listening to music and reading.    Social Determinants of Health   Financial Resource Strain: Low Risk    Difficulty of Paying Living Expenses: Not hard at all  Food Insecurity: No Food Insecurity   Worried About Charity fundraiser in the Last Year: Never true   [place] in the Last Year: Never true  Transportation  Needs: No Transportation Needs   Lack of Transportation (Medical): No   Lack of Transportation (Non-Medical): No  Physical Activity: Inactive   Days of Exercise per Week: 0 days   Minutes of Exercise per Session: 0 min  Stress: No Stress Concern Present   Feeling of Stress : Not at all  Social Connections: Moderately Integrated   Frequency of Communication with Friends and Family: More than three times a week   Frequency of Social Gatherings with Friends and Family: More than three times a week   Attends Religious Services: Never   Marine scientist or Organizations: Yes   Attends Music therapist: 1 to 4 times per year   Marital Status: Married  Human resources officer Violence: Not At Risk   Fear of Current or Ex-Partner: No   Emotionally Abused: No   Physically Abused: No   Sexually Abused: No    Family History  Problem Relation Age of Onset   Diabetes Father    Cancer Father 92       Esophogeal Ca., Lung.   Hypertension Other    Thyroid disease Other    Colon cancer Daughter 56   Glaucoma Mother    Hepatitis C Mother 48       Died from.   Glaucoma Sister    Glaucoma Sister    Glaucoma Maternal Aunt     ROS: no fevers or chills, productive cough, hemoptysis, dysphasia, odynophagia, melena, hematochezia, dysuria, hematuria, rash, seizure activity, orthopnea, PND, pedal edema, claudication. Remaining systems are negative.  Physical Exam: Well-developed well-nourished in no acute distress.  Skin is warm and dry.  HEENT is normal.  Neck is supple.  Chest is clear to auscultation with normal expansion.  Cardiovascular exam is regular rate and rhythm.  Abdominal exam nontender or distended. No masses palpated. Extremities show no edema. neuro grossly intact  A/P  1 nonischemic cardiomyopathy-previous ejection fraction felt to be approximately 30% based on my review of echocardiogram.  Previous CTA did not show significant obstructive coronary disease.   Question if cardiomyopathy is related to left bundle branch block/dyssynchrony.  Continue Lisabeth Register and spironolactone.  She discontinued carvedilol on her own as she felt it was causing nausea.  I will try Toprol 25 mg daily.  She has significant claustrophobia and cannot tolerate cardiac MRI.  We will repeat echocardiogram and if ejection fraction less than 35% would consider CRT-D.  2 chronic systolic congestive heart failure-she appears to be euvolemic.  Continue spironolactone.  3 hypertension-patient's blood pressure is controlled.  Continue present medical regimen.  4 hyperlipidemia-continue statin.  5 mitral regurgitation-mild based on my review.  6 fatigue-she complains of severe fatigue.  As above we will  repeat echocardiogram and if ejection fraction remains less than 35% will refer for CRT-D.  I will also check CK, hemoglobin and TSH to be complete.  Kirk Ruths, MD

## 2022-03-19 NOTE — Progress Notes (Signed)
?Advanced Heart Failure Clinic Note  ? ?PCP: [name], MD ?Cardiology: Dr. Stanford Breed ?HF Cardiology: Dr. Aundra Dubin ? ?HPI:  ?76 y.o. with history of LBBB, HTN, and nonischemic cardiomyopathy was referred by Dr. Stanford Breed for evaluation of CHF.  Patient reports exertional dyspnea now for a couple of years. She was evaluated by her PCP and referred to Dr. Stanford Breed in 09/2021 for dyspnea and a LBBB noted by ECG. Echo in 09/2021 showed EF in the 30% range with normal RV, mild AI, mild MR, and IVC normal.  Coronary CTA to follow this showed mild nonobstructive CAD. She has no history of ETOH or drugs.  She has no family history of cardiomyopathy that she knows of.  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   ?  ?Patient returned for followup of CHF on 02/26/2022.  She reported easy fatigability and feeling sleepy during the day. Fatigue had improved with medication adjustment.  She still was getting tired walking a long distance, but denied shortness of breath now walking on flat ground.  She was able to walk around stores without trouble. Denied orthopnea/PND.   ? ?Since then she presented to urgent care x 2 for bilateral ear fullness and found to have bilateral otitis media treated with Augmentin and instructed to follow-up with her PCP.  ?  ?Today she returns to HF clinic for pharmacist medication titration. At last visit with MD on  4/17, carvedilol was increased from 3.125 mg to 6.25 mg twice daily.  ? ?Overall she has been feeling fatigued and having trouble sleeping since last visit. Her fatigue has vastly improved over the past week per her report. She attributes this fatigue due to the increase in her carvedilol, but thinks that her body has now gotten used to the higher dose. She denies dizziness, lightheadedness, chest pain or palpitations. Her breathing has been a lot better and she is able to walk further without getting SOB. She is able to complete all ADLs and is still working 3 days a  week cutting hair. Her weight at home has been stable at 192-194 lbs. She does not take a loop diuretic. Trace LEE on exam today. Denies PND or orthopnea, but has been waking up a lot in the middle of the night. Her appetite has been good and she adheres to a low-salt diet and watches her fluid intake.  ? ?HF Medications: ?Carvedilol 6.25 mg BID ?Entresto 97/103 mg BID ?Spironolactone 25 mg daily ?Farxiga 10 mg daily ? ?Has the patient been experiencing any side effects to the medications prescribed?  No ? ?Does the patient have any problems obtaining medications due to transportation or finances?  ?[place] [employer]. PAN grant approved for Entresto/Farxiga ?Gets patient assistance for semaglutide via another office. ? ?Understanding of regimen: good ?Understanding of indications: fair ?Potential of compliance: excellent ?Patient understands to avoid NSAIDs. ?Patient understands to avoid decongestants. ? ?Pertinent Lab Values: ?Labs 02/26/2022: Serum creatinine 0.66, BUN 18, Potassium 4.1, Sodium 138, BNP 111.4 ? ?Vital Signs: ?Weight: 196.2 lbs (last clinic weight: 195 lbs) ?Blood pressure: 140/90 mmHg  ?Heart rate: 65 bpm ? ?Assessment/Plan: ?1. Chronic systolic CHF: Nonischemic cardiomyopathy.  Coronary CTA in 09/2021 with mild nonobstructive CAD.  Echo in 09/2021 indicated EF 30%, normal RV size and systolic function, mild MR, mild AI, IVC normal. No ETOH.  No FH of cardiomyopathy.  No arrhythmias have been noted.  She may have a LBBB cardiomyopathy.   ?NYHA class II symptoms, some improvement.  She is  not volume overloaded on exam today. ?- Continue carvedilol 6.25 mg bid  ?- Continue Entresto 97/103 bid  ?- Increase spironolactone to 50 mg daily for resistant HTN - BMET in 1 week at [facility]. Orders placed. ?- Continue dapagliflozin 10 mg daily ?- Repeat echo in 05/2022, if EF remains low considering CRT-D.  ?- She does not think she could tolerate a cardiac MRI due to claustrophobia.  ?2. HTN: Elevated today in  clinic. Medication change as above.  ?3. OSA: Moderate OSA, waiting for CPAP- SW will follow-up on this as the patient has not heard from the company.   ? ?Follow up in 2 months with Dr. Aundra Dubin and ECHO ? ?Cathrine Muster, PharmD, BCPS ?PGY2 Cardiology Pharmacy Resident ? ?Kerby Nora, PharmD, BCPS ?Advanced Heart Failure Clinic Pharmacist ?[phone] ?  ?

## 2022-03-21 ENCOUNTER — Telehealth (HOSPITAL_COMMUNITY): Payer: Self-pay | Admitting: Licensed Clinical Social Worker

## 2022-03-21 NOTE — Telephone Encounter (Signed)
CSW consulted by pharmacy team to help follow up on status of CPAP order as pt had reported she hadn't heard anything regarding this. ? ?CSW reviewed notes and saw that order was placed by Dr. Landis Gandy office to [facility] about a month ago.  CSW called [employer] and spoke with rep who reports they have everything they need but after work up was complete must have been placed in the wrong stack.  They will work on this order this morning and reach out to pt by tomorrow to set up time to get fitting. ? ?Jorge Ny, LCSW ?Clinical Social Worker ?Advanced Heart Failure Clinic ?Desk#: [phone] ?Cell#: [phone] ? ?

## 2022-04-02 ENCOUNTER — Ambulatory Visit (INDEPENDENT_AMBULATORY_CARE_PROVIDER_SITE_OTHER): Payer: Medicare Other | Admitting: Cardiology

## 2022-04-02 ENCOUNTER — Encounter: Payer: Self-pay | Admitting: Cardiology

## 2022-04-02 VITALS — BP 102/64 | HR 73 | Ht 62.0 in | Wt 190.1 lb

## 2022-04-02 DIAGNOSIS — I429 Cardiomyopathy, unspecified: Secondary | ICD-10-CM | POA: Diagnosis not present

## 2022-04-02 DIAGNOSIS — I10 Essential (primary) hypertension: Secondary | ICD-10-CM

## 2022-04-02 DIAGNOSIS — R5383 Other fatigue: Secondary | ICD-10-CM | POA: Diagnosis not present

## 2022-04-02 DIAGNOSIS — E7800 Pure hypercholesterolemia, unspecified: Secondary | ICD-10-CM

## 2022-04-02 DIAGNOSIS — I5022 Chronic systolic (congestive) heart failure: Secondary | ICD-10-CM | POA: Diagnosis not present

## 2022-04-02 MED ORDER — METOPROLOL SUCCINATE ER 25 MG PO TB24: 25.0000 mg | ORAL_TABLET | Freq: Every day | ORAL | 3 refills | Status: DC

## 2022-04-02 NOTE — Patient Instructions (Signed)
Medication Instructions:   STOP CARVEDILOL  START METOPROLOL SUCC ER 25 MG ONCE DAILY AT BEDTIME  *If you need a refill on your cardiac medications before your next appointment, please call your pharmacy*   Testing/Procedures:  Your physician has requested that you have an echocardiogram. Echocardiography is a painless test that uses sound waves to create images of your heart. It provides your doctor with information about the size and shape of your heart and how well your heart's chambers and valves are working. This procedure takes approximately one hour. There are no restrictions for this procedure. [address]   Follow-Up: At [facility], you and your health needs are our priority.  As part of our continuing mission to provide you with exceptional heart care, we have created designated Provider Care Teams.  These Care Teams include your primary Cardiologist (physician) and Advanced Practice Providers (APPs -  Physician Assistants and Nurse Practitioners) who all work together to provide you with the care you need, when you need it.  We recommend signing up for the patient portal called "MyChart".  Sign up information is provided on this After Visit Summary.  MyChart is used to connect with patients for Virtual Visits (Telemedicine).  Patients are able to view lab/test results, encounter notes, upcoming appointments, etc.  Non-urgent messages can be sent to your provider as well.   To learn more about what you can do with MyChart, go to [email or web address].    Your next appointment:   3 month(s)  The format for your next appointment:   In Person  Provider:   Kirk Ruths, MD     Important Information About Sugar

## 2022-04-05 ENCOUNTER — Other Ambulatory Visit: Payer: Self-pay | Admitting: *Deleted

## 2022-04-05 MED ORDER — METFORMIN HCL 500 MG PO TABS
500.0000 mg | ORAL_TABLET | Freq: Two times a day (BID) | ORAL | 1 refills | Status: DC
Start: 2022-04-05 — End: 2022-11-28

## 2022-04-10 DIAGNOSIS — H903 Sensorineural hearing loss, bilateral: Secondary | ICD-10-CM | POA: Diagnosis not present

## 2022-04-11 ENCOUNTER — Ambulatory Visit (INDEPENDENT_AMBULATORY_CARE_PROVIDER_SITE_OTHER): Payer: Medicare Other | Admitting: Pharmacist

## 2022-04-11 DIAGNOSIS — E118 Type 2 diabetes mellitus with unspecified complications: Secondary | ICD-10-CM

## 2022-04-11 DIAGNOSIS — R5383 Other fatigue: Secondary | ICD-10-CM | POA: Diagnosis not present

## 2022-04-11 DIAGNOSIS — E785 Hyperlipidemia, unspecified: Secondary | ICD-10-CM

## 2022-04-11 DIAGNOSIS — Z794 Long term (current) use of insulin: Secondary | ICD-10-CM

## 2022-04-11 DIAGNOSIS — E1169 Type 2 diabetes mellitus with other specified complication: Secondary | ICD-10-CM

## 2022-04-11 DIAGNOSIS — I10 Essential (primary) hypertension: Secondary | ICD-10-CM

## 2022-04-11 NOTE — Progress Notes (Signed)
Chronic Care Management Pharmacy Note  04/11/2022 Name:  Jillian Huynh MRN:  [id number] DOB:  12-12-1945  Summary: addressed HTN, HLD, and primarily DM. Rybelsus and tresiba patient assistance sent, awaiting update from company.  Out of rybelsus -> patient is unsure of patient assistance status from company.   Glucose per CGM:  Date of Download: 04/11/22 % Time CGM is active: 92% Average Glucose: 187 mg/dL Glucose Management Indicator: 7.8%  Glucose Variability: 16.4% (goal <36%) Time in Goal:  - Time in range 70-180: 49% - Time above range: 51% - Time below range: 0% Observed patterns:dinner still the time of highest readings, one hypoglycemic reading may 22, with exercise   Date of Download: 12/27/21 % Time CGM is active: 97% Average Glucose: 197 mg/dL Glucose Management Indicator: 8%  Glucose Variability: 21.4% (goal <36%) Time in Goal:  - Time in range 70-180: 45% - Time above range: 55% - Time below range: 0% Observed patterns: dinner still the time of highest readings   Recommendations/Changes made from today's visit:   - Patient was at work unexpectedly, call was cut short and will resume next week - In meantime, will pursue status of rybelsus patient assistance using support staff  - Encouraged patient to check BP at home & write down values for future discussion  Plan: f/u with pharmacist next week  Subjective: Jillian Huynh is an 76 y.o. year old female who is a primary patient of Metheney, Rene Kocher, MD.  The CCM team was consulted for assistance with disease management and care coordination needs.    Engaged with patient by telephone for follow up visit in response to provider referral for pharmacy case management and/or care coordination services.   Consent to Services:  The patient was given information about Chronic Care Management services, agreed to services, and gave verbal consent prior to initiation of services.  Please see initial visit note for  detailed documentation.   Patient Care Team: [name], MD as PCP - General (Family Medicine) Darius Bump, [facility] as Pharmacist (Pharmacist)   Objective:  Lab Results  Component Value Date   CREATININE 0.66 02/26/2022   CREATININE 0.71 01/01/2022   CREATININE 0.65 12/11/2021    Lab Results  Component Value Date   HGBA1C 8.2 (A) 02/19/2022   Last diabetic Eye exam:  Lab Results  Component Value Date/Time   HMDIABEYEEXA No Retinopathy 08/23/2021 12:00 AM        Component Value Date/Time   CHOL 110 12/25/2021 1104   TRIG 188 (H) 12/25/2021 1104   HDL 40 (L) 12/25/2021 1104   CHOLHDL 2.8 12/25/2021 1104   VLDL 25 12/11/2021 1244   LDLCALC 44 12/25/2021 1104       Latest Ref Rng & Units 12/25/2021   11:04 AM 10/24/2021    8:51 AM 12/26/2020   11:24 AM  Hepatic Function  Total Protein 6.1 - 8.1 g/dL 6.7   6.5   6.6    AST 10 - 35 U/L '12   9   22    '$ ALT 6 - 29 U/L 11   14   34    Total Bilirubin 0.2 - 1.2 mg/dL 0.9   0.8   1.2    Bilirubin, Direct 0.0 - 0.2 mg/dL 0.1   0.1       Lab Results  Component Value Date/Time   TSH 3.48 09/18/2021 03:23 PM   TSH 1.99 07/09/2018 03:43 PM       Latest Ref Rng &  Units 09/18/2021    3:23 PM 12/26/2020   11:24 AM 07/09/2018    3:43 PM  CBC  WBC 3.8 - 10.8 Thousand/uL 7.9   6.7   8.1    Hemoglobin 11.7 - 15.5 g/dL 13.3   12.7   12.3    Hematocrit 35.0 - 45.0 % 40.1   37.8   36.8    Platelets 140 - 400 Thousand/uL 275   254   285      Lab Results  Component Value Date/Time   VD25OH 26 (L) 03/24/2015 09:08 AM    Clinical ASCVD: Yes  The ASCVD Risk score (Arnett DK, et al., 2019) failed to calculate for the following reasons:   The valid total cholesterol range is 130 to 320 mg/dL     Social History   Tobacco Use  Smoking Status Never  Smokeless Tobacco Never   BP Readings from Last 3 Encounters:  04/02/22 102/64  03/19/22 140/90  03/16/22 131/76   Pulse Readings from Last 3 Encounters:   04/02/22 73  03/19/22 65  03/16/22 62   Wt Readings from Last 3 Encounters:  04/02/22 190 lb 1.3 oz (86.2 kg)  03/19/22 196 lb 3.2 oz (89 kg)  02/26/22 195 lb (88.5 kg)    Assessment: Review of patient past medical history, allergies, medications, health status, including review of consultants reports, laboratory and other test data, was performed as part of comprehensive evaluation and provision of chronic care management services.   SDOH:  (Social Determinants of Health) assessments and interventions performed:    CCM Care Plan  Allergies  Allergen Reactions   Bydureon [Exenatide] Nausea And Vomiting   Glipizide Other (See Comments)    Leg cramps, fatigue, and weight gain   Trulicity [Dulaglutide] Diarrhea   Invokana [Canagliflozin] Other (See Comments)    Yeast infection   Victoza [Liraglutide] Nausea Only and Other (See Comments)    Constipation,belching,bloating    Medications Reviewed Today     Reviewed by Lelon Perla, MD (Physician) on 04/02/22 at 1117  Med List Status: <None>   Medication Order Taking? Sig Documenting Provider Last Dose Status Informant  Apoaequorin (PREVAGEN PO) [id number] Yes Take 1 tablet by mouth daily. [provider] Taking Active   atorvastatin (LIPITOR) 40 MG tablet [id number] Yes Take 1 tablet (40 mg total) by mouth daily. Lelon Perla, MD Taking Active   B-D ULTRAFINE III SHORT PEN 31G X 8 MM MISC [id number] Yes SUB-Q Daily [name], MD Taking Active   Calcium Carbonate-Vit D-Min (CALCIUM 1200 PO) [id number] Yes Take 1 tablet by mouth in the morning and at bedtime. [provider] Taking Active            Med Note Stanford [place]   Mon Feb 26, 2022 10:31 AM)    carvedilol (COREG) 6.25 MG tablet [id number] No Take 1 tablet (6.25 mg total) by mouth 2 (two) times daily.  Patient not taking: Reported on 04/02/2022   Larey Dresser, MD Not Taking Active   Continuous Blood Gluc Sensor (FREESTYLE LIBRE  3 SENSOR) [place] [id number] Yes 1 each by Does not apply route every 14 (fourteen) days. Sensors to be changed every 14 days. DX E11.8 [name], MD Taking Active   ezetimibe (ZETIA) 10 MG tablet [id number] Yes Take 1 tablet (10 mg total) by mouth daily. Lelon Perla, MD Taking Active            Med Note Gilman Buttner, Juanita Laster  Mon Feb 26, 2022 10:30 AM)    FARXIGA 10 MG TABS tablet [id number] Yes TAKE 1 TABLET BY MOUTH EVERY DAY BEFORE BREAKFAST [name], MD Taking Active   Insulin Degludec [facility]) [id number] Yes Inject 42 Units into the skin daily. [provider] Taking Active   metFORMIN (GLUCOPHAGE) 500 MG tablet [id number] No Take 500 mg by mouth 2 (two) times daily with a meal.  Patient not taking: Reported on 04/02/2022   [provider] Not Taking Active   Omega-3 Fatty Acids (FISH OIL) 1200 MG CAPS [id number] Yes Take 1 capsule by mouth daily. [provider] Taking Active   [facility] VERIO test strip [id number] Yes DX DM E11.65. CHECK FASTING BLOOD SUGAR EVERY MORNING AND 2 HOURS AFTER LARGE MEALS DAILY. CHECK 3 TIMES DAILY [name], MD Taking Active   pantoprazole (PROTONIX) 40 MG tablet [id number] Yes TAKE 1 TABLET BY MOUTH EVERY DAY [name], MD Taking Active   Probiotic Product (PROBIOTIC PO) [id number] Yes Take by mouth. [provider] Taking Active   rOPINIRole (REQUIP) 3 MG tablet [id number] Yes TAKE 1 TABLET BY MOUTH EVERY DAY [name], MD Taking Active   sacubitril-valsartan [facility]) 97-103 MG [id number] Yes Take 1 tablet by mouth 2 (two) times daily. Larey Dresser, MD Taking Active   Semaglutide [facility]) 14 MG TABS [id number] Yes Take 1 tablet by mouth daily. [provider] Taking Active   spironolactone (ALDACTONE) 50 MG tablet [id number] Yes Take 1 tablet (50 mg total) by mouth daily. Larey Dresser, MD Taking Active             Patient Active Problem List    Diagnosis Date Noted   Left bundle branch block (LBBB) determined by electrocardiography 08/08/2021   Gastroesophageal reflux disease without esophagitis 09/19/2020   Severe obesity (BMI 35.0-39.9) with comorbidity ([place]) 05/25/2019   Fatty liver 12/25/2017   BP (high blood pressure) 03/28/2015   Dyslipidemia 03/28/2015   Insomnia 03/28/2015   Restless leg 03/28/2015   Mass of arm 03/14/2015   Diabetes mellitus type 2, controlled ([place]) 03/14/2015   Obstructive apnea 09/03/2012   HYPERKERATOSIS 04/01/2009    Immunization History  Administered Date(s) Administered   Moderna Sars-Covid-2 Vaccination 01/06/2020, 02/12/2020   Pneumococcal Conjugate-13 12/25/2017   Tdap 03/26/2007, 06/29/2015    Conditions to be addressed/monitored: HTN, HLD, and DMII  There are no care plans that you recently modified to display for this patient.           Medication Assistance:  Rybelsus obtained through [employer] medication assistance program.  Enrollment ends Nov 11 2021. New application in process for 2023 year to include rybelsus and tresiba.  Also provided application for new entresto.  Patient's preferred pharmacy is:  [place], [place] - [place] [address] [place] [address] Phone: [phone] Fax: [phone]   Uses pill box? Yes Pt endorses 100% compliance  Follow Up:  Patient agrees to Care Plan and Follow-up.  Plan: Telephone follow up appointment with care management team member scheduled for:  1 week  Larinda Buttery, PharmD Clinical Pharmacist [facility] [phone]

## 2022-04-11 NOTE — Patient Instructions (Signed)
Visit Information  Thank you for taking time to visit with me today. Please don't hesitate to contact me if I can be of assistance to you before our next scheduled telephone appointment.  Following are the goals we discussed today:  Patient Goals/Self-Care Activities Over the next 7 days, patient will:  Take medications as prescribed and utilize CGM, for data to be discussed at future appointments, check BP and write down for future discussion Follow Up Plan: Telephone follow up appointment with care management team member scheduled for: 1 week  Please call the care guide team at [phone] if you need to cancel or reschedule your appointment.   Patient verbalizes understanding of instructions and care plan provided today and agrees to view in [place]. Active MyChart status and patient understanding of how to access instructions and care plan via MyChart confirmed with patient.     Jillian Huynh

## 2022-04-12 LAB — CBC
HCT: 39.9 % (ref 35.0–45.0)
Hemoglobin: 13.4 g/dL (ref 11.7–15.5)
MCH: 28.3 pg (ref 27.0–33.0)
MCHC: 33.6 g/dL (ref 32.0–36.0)
MCV: 84.4 fL (ref 80.0–100.0)
MPV: 11.1 fL (ref 7.5–12.5)
Platelets: 264 10*3/uL (ref 140–400)
RBC: 4.73 10*6/uL (ref 3.80–5.10)
RDW: 14.3 % (ref 11.0–15.0)
WBC: 7.5 10*3/uL (ref 3.8–10.8)

## 2022-04-12 LAB — CK: Total CK: 44 U/L (ref 29–143)

## 2022-04-12 LAB — TSH: TSH: 3.25 mIU/L (ref 0.40–4.50)

## 2022-04-13 ENCOUNTER — Telehealth: Payer: Self-pay | Admitting: Pharmacist

## 2022-04-13 NOTE — Progress Notes (Signed)
I have contacted [employer] to check on the patients Rybelsus medication. The representative stated they had last mailed out a 30 day supply of Rybelsus on 02/12/22 and it was signed that the medication was received in the office. The representative also stated that they have also mailed out 4 bottled of Rybelsus that was shipped on 04/09/22 and should be arriving to the office on June 12 th and June 16 th. [facility] also stated that if the medication that was shipped back in April was miss placed or never received by [place] he faxed over a replacement letter to the office, so they can replace the medication to the patient if lost.    Corrie Mckusick, [place]

## 2022-04-16 ENCOUNTER — Ambulatory Visit (INDEPENDENT_AMBULATORY_CARE_PROVIDER_SITE_OTHER): Payer: Medicare Other | Admitting: Pharmacist

## 2022-04-16 DIAGNOSIS — E118 Type 2 diabetes mellitus with unspecified complications: Secondary | ICD-10-CM

## 2022-04-16 DIAGNOSIS — E1169 Type 2 diabetes mellitus with other specified complication: Secondary | ICD-10-CM

## 2022-04-16 DIAGNOSIS — I10 Essential (primary) hypertension: Secondary | ICD-10-CM

## 2022-04-16 NOTE — Progress Notes (Unsigned)
Chronic Care Management Pharmacy Note  04/18/2022 Name:  Jillian Huynh MRN:  [id number] DOB:  [birth year]  Summary: addressed HTN, HLD, and primarily DM. Rybelsus and tresiba obtained via patient assistance.   Patient is nearly out of rybelsus.   Glucose per CGM:  Date of Download: 04/11/22 % Time CGM is active: 92% Average Glucose: 187 mg/dL Glucose Management Indicator: 7.8%  Glucose Variability: 16.4% (goal <36%) Time in Goal:  - Time in range 70-180: 49% - Time above range: 51% - Time below range: 0% Observed patterns:dinner still the time of highest readings, one hypoglycemic reading may 22, with exercise   Date of Download: 12/27/21 % Time CGM is active: 97% Average Glucose: 197 mg/dL Glucose Management Indicator: 8%  Glucose Variability: 21.4% (goal <36%) Time in Goal:  - Time in range 70-180: 45% - Time above range: 55% - Time below range: 0% Observed patterns: dinner still the time of highest readings   Recommendations/Changes made from today's visit:   - No changes - Rybelsus shipment is expected to our office June 12-16. Arranging samples in meantime to cover these 1-2 weeks of gap.   Plan: f/u with pharmacist in 2 months, sooner if patient desires  Subjective: Jillian Huynh is an 76 y.o. year old female who is a primary patient of Metheney, Rene Kocher, MD.  The CCM team was consulted for assistance with disease management and care coordination needs.    Engaged with patient by telephone for follow up visit in response to provider referral for pharmacy case management and/or care coordination services.   Consent to Services:  The patient was given information about Chronic Care Management services, agreed to services, and gave verbal consent prior to initiation of services.  Please see initial visit note for detailed documentation.   Patient Care Team: [name], MD as PCP - General (Family Medicine) Darius Bump, [facility] as Pharmacist  (Pharmacist)   Objective:  Lab Results  Component Value Date   CREATININE 0.66 02/26/2022   CREATININE 0.71 01/01/2022   CREATININE 0.65 12/11/2021    Lab Results  Component Value Date   HGBA1C 8.2 (A) 02/19/2022   Last diabetic Eye exam:  Lab Results  Component Value Date/Time   HMDIABEYEEXA No Retinopathy 08/23/2021 12:00 AM        Component Value Date/Time   CHOL 110 12/25/2021 1104   TRIG 188 (H) 12/25/2021 1104   HDL 40 (L) 12/25/2021 1104   CHOLHDL 2.8 12/25/2021 1104   VLDL 25 12/11/2021 1244   LDLCALC 44 12/25/2021 1104       Latest Ref Rng & Units 12/25/2021   11:04 AM 10/24/2021    8:51 AM 12/26/2020   11:24 AM  Hepatic Function  Total Protein 6.1 - 8.1 g/dL 6.7   6.5   6.6    AST 10 - 35 U/L '12   9   22    '$ ALT 6 - 29 U/L 11   14   34    Total Bilirubin 0.2 - 1.2 mg/dL 0.9   0.8   1.2    Bilirubin, Direct 0.0 - 0.2 mg/dL 0.1   0.1       Lab Results  Component Value Date/Time   TSH 3.25 04/11/2022 11:39 AM   TSH 3.48 09/18/2021 03:23 PM       Latest Ref Rng & Units 04/11/2022   11:39 AM 09/18/2021    3:23 PM 12/26/2020   11:24 AM  CBC  WBC 3.8 -  10.8 Thousand/uL 7.5   7.9   6.7    Hemoglobin 11.7 - 15.5 g/dL 13.4   13.3   12.7    Hematocrit 35.0 - 45.0 % 39.9   40.1   37.8    Platelets 140 - 400 Thousand/uL 264   275   254      Lab Results  Component Value Date/Time   VD25OH 26 (L) 03/24/2015 09:08 AM    Clinical ASCVD: Yes  The ASCVD Risk score (Arnett DK, et al., 2019) failed to calculate for the following reasons:   The valid total cholesterol range is 130 to 320 mg/dL     Social History   Tobacco Use  Smoking Status Never  Smokeless Tobacco Never   BP Readings from Last 3 Encounters:  04/02/22 102/64  03/19/22 140/90  03/16/22 131/76   Pulse Readings from Last 3 Encounters:  04/02/22 73  03/19/22 65  03/16/22 62   Wt Readings from Last 3 Encounters:  04/02/22 190 lb 1.3 oz (86.2 kg)  03/19/22 196 lb 3.2 oz (89 kg)   02/26/22 195 lb (88.5 kg)    Assessment: Review of patient past medical history, allergies, medications, health status, including review of consultants reports, laboratory and other test data, was performed as part of comprehensive evaluation and provision of chronic care management services.   SDOH:  (Social Determinants of Health) assessments and interventions performed:    CCM Care Plan  Allergies  Allergen Reactions   Bydureon [Exenatide] Nausea And Vomiting   Glipizide Other (See Comments)    Leg cramps, fatigue, and weight gain   Trulicity [Dulaglutide] Diarrhea   Invokana [Canagliflozin] Other (See Comments)    Yeast infection   Victoza [Liraglutide] Nausea Only and Other (See Comments)    Constipation,belching,bloating    Medications Reviewed Today     Reviewed by Darius Bump, [place] (Pharmacist) on 04/16/22 at 1453  Med List Status: <None>   Medication Order Taking? Sig Documenting Provider Last Dose Status Informant  Apoaequorin (PREVAGEN PO) [id number] Yes Take 1 tablet by mouth daily. [provider] Taking Active   atorvastatin (LIPITOR) 40 MG tablet [id number] Yes Take 1 tablet (40 mg total) by mouth daily. Lelon Perla, MD Taking Active   B-D ULTRAFINE III SHORT PEN 31G X 8 MM MISC [id number] Yes SUB-Q Daily [name], MD Taking Active   Calcium Carbonate-Vit D-Min (CALCIUM 1200 PO) [id number] Yes Take 1 tablet by mouth in the morning and at bedtime. [provider] Taking Active            Med Note [facility] [place]   Mon Feb 26, 2022 10:31 AM)    Continuous Blood Gluc Sensor (FREESTYLE LIBRE 3 SENSOR) [place] [id number] Yes 1 each by Does not apply route every 14 (fourteen) days. Sensors to be changed every 14 days. DX E11.8 [name], MD Taking Active   ezetimibe (ZETIA) 10 MG tablet [id number] Yes Take 1 tablet (10 mg total) by mouth daily. Lelon Perla, MD Taking Active            Med Note [facility]    Mon Feb 26, 2022 10:30 AM)    [place] 10 MG TABS tablet [id number] Yes TAKE 1 TABLET BY MOUTH EVERY DAY BEFORE BREAKFAST [name], MD Taking Active   Insulin Degludec [facility]) [id number] Yes Inject 42 Units into the skin daily. [provider] Taking Active   metFORMIN (GLUCOPHAGE) 500 MG tablet [id number] Yes Take  1 tablet (500 mg total) by mouth 2 (two) times daily with a meal. [name], MD Taking Active   metoprolol succinate (TOPROL XL) 25 MG 24 hr tablet [id number] Yes Take 1 tablet (25 mg total) by mouth daily. Lelon Perla, MD Taking Active   Omega-3 Fatty Acids (FISH OIL) 1200 MG CAPS [id number] Yes Take 1 capsule by mouth daily. [provider] Taking Active   [facility] VERIO test strip [id number] Yes DX DM E11.65. CHECK FASTING BLOOD SUGAR EVERY MORNING AND 2 HOURS AFTER LARGE MEALS DAILY. CHECK 3 TIMES DAILY [name], MD Taking Active   pantoprazole (PROTONIX) 40 MG tablet [id number] Yes TAKE 1 TABLET BY MOUTH EVERY DAY [name], MD Taking Active   Probiotic Product (PROBIOTIC PO) [id number] Yes Take by mouth. [provider] Taking Active   rOPINIRole (REQUIP) 3 MG tablet [id number] Yes TAKE 1 TABLET BY MOUTH EVERY DAY [name], MD Taking Active   sacubitril-valsartan [facility]) 97-103 MG [id number] Yes Take 1 tablet by mouth 2 (two) times daily. Larey Dresser, MD Taking Active   Semaglutide [facility]) 14 MG TABS [id number] Yes Take 1 tablet by mouth daily. [provider] Taking Active   spironolactone (ALDACTONE) 50 MG tablet [id number] Yes Take 1 tablet (50 mg total) by mouth daily. Larey Dresser, MD Taking Active             Patient Active Problem List   Diagnosis Date Noted   Left bundle branch block (LBBB) determined by electrocardiography 08/08/2021   Gastroesophageal reflux disease without esophagitis 09/19/2020   Severe obesity (BMI 35.0-39.9) with comorbidity ([place])  05/25/2019   Fatty liver 12/25/2017   BP (high blood pressure) 03/28/2015   Dyslipidemia 03/28/2015   Insomnia 03/28/2015   Restless leg 03/28/2015   Mass of arm 03/14/2015   Diabetes mellitus type 2, controlled ([place]) 03/14/2015   Obstructive apnea 09/03/2012   HYPERKERATOSIS 04/01/2009    Immunization History  Administered Date(s) Administered   Moderna Sars-Covid-2 Vaccination 01/06/2020, 02/12/2020   Pneumococcal Conjugate-13 12/25/2017   Tdap 03/26/2007, 06/29/2015    Conditions to be addressed/monitored: HTN, HLD, and DMII  Care Plan : Medication Management  Updates made by Darius Bump, [place] since 04/18/2022 12:00 AM     Problem: HTN, DM, HLD   Priority: High     Long-Range Goal: Disease Progression Prevention   Recent Progress: On track  Priority: High  Note:   Current Barriers:  Unable to achieve control of diabetes   Pharmacist Clinical Goal(s):  Over the next 60 days, patient will achieve control of diabetes as evidenced by blood glucose values and A1c through collaboration with PharmD and provider.   Interventions: 1:1 collaboration with [name], MD regarding development and update of comprehensive plan of care as evidenced by provider attestation and co-signature Inter-disciplinary care team collaboration (see longitudinal plan of care) Comprehensive medication review performed; medication list updated in electronic medical record  Diabetes:  Uncontrolled; tresiba 45 units daily, farxiga '10mg'$  daily, rybelsus '14mg'$  daily  Current glucose readings: via CGM - switched to [place] 3 Date of Download: 04/11/22 % Time CGM is active: 92% Average Glucose: 187 mg/dL Glucose Management Indicator: 7.8%  Glucose Variability: 16.4% (goal <36%) Time in Goal:  - Time in range 70-180: 49% - Time above range: 51% - Time below range: 0% Observed patterns:dinner still the time of highest readings, one hypoglycemic reading may 22, with exercise  Date of  Download: 12/27/21 % Time CGM  is active: 97% Average Glucose: 197 mg/dL Glucose Management Indicator: 8%  Glucose Variability: 21.4% (goal <36%) Time in Goal:  - Time in range 70-180: 45% - Time above range: 55% - Time below range: 0% Observed patterns: dinner still the time of highest readings  Date of Download: 11/08/21 % Time CGM is active: 91% Average Glucose: 194 mg/dL Glucose Management Indicator: 8.0%  Glucose Variability: 19.2% (goal <36%) Time in Goal:  - Time in range 70-180: 41% - Time above range: 59% - Time below range: 0% Observed patterns: 6-9pm still highest glucose range  Date of Download: 10/09/21 % Time CGM is active: 78% Average Glucose: 192 mg/dL Glucose Management Indicator: 7.9%  Glucose Variability: 21.1% (goal <36%) Time in Goal:  - Time in range 70-180: 46% - Time above range: 54% - Time below range: 0% Observed patterns:erratic, eating habits have changed over holiday season, which is to be expected.  Date of Download: 09/25/21 % Time CGM is active: 82% Average Glucose: 166 mg/dL Glucose Management Indicator: 7.3%  Glucose Variability: 16.1 (goal <36%) Time in Goal:  - Time in range 70-180: 78% - Time above range: 22% - Time below range: 0% Observed patterns: Still highest range 7-9pm  Date of Download: 09/04/21 % Time CGM is active: 81% Average Glucose: 166 mg/dL Glucose Management Indicator: 7.3%  Glucose Variability: 16.6% (goal <36%) Time in Goal:  - Time in range 70-180: 73% - Time above range: 27% - Time below range: 0% Observed patterns: BG still highest 8-9pm, but still shows improvement  Date of Download: 07/24/21 % Time CGM is active: 85% Average Glucose: 173 mg/dL Glucose Management Indicator: 7.4  Glucose Variability: 16.9 (goal <36%) Time in Goal:  - Time in range 70-180: 65% - Time above range: 35% - Time below range: 0% Observed patterns: still with elevations 8-10pm, after patient gets off of a long day at work  she states she eats late. We are working on this.  Date of Download: 07/12/21 % Time CGM is active: 84% Average Glucose: 197 mg/dL Glucose Management Indicator: 8.0%  Glucose Variability: 24.6 (goal <36%) Time in Goal:  - Time in range 70-180: 45% - Time above range: 55% - Time below range: 0% Observed patterns: Still with highest spikes ~9pm, glucose hits 270-300s.   Denies hyper/hypoglycemic symptoms  Previously discussed current meal patterns:  breakfast: oatmeal, eggs, "stays away from white bread";  lunch: ham & cheese 1/2 sandwhich (w/whole wheat), salad, apple;  dinner: pasta, potatoes, attempts meat/vegetables as much as possible;  snacks: crackers, cheese, sneaks cookie, husband brings sweets into house & patient struggles, describes self as "sugar-holic" ;   Making progress toward goals. Patient also expressed cost constraint with tresiba & farxiga. Patient IS eligible for tresiba & rybelsus patient assistance, but not farxiga.    Hypertension:  Controlled; entresto 97-'103mg'$  BID, metoprolol XL '25mg'$  daily   Current home readings: not currently checking  Denies hypotensive/hypertensive symptoms  Recommended continue current regimen  Hyperlipidemia:  Controlled; atorvastatin '40mg'$  + ezetemibe '10mg'$  daily;   Recommended continue current regimen  Patient Goals/Self-Care Activities Over the next 60 days, patient will:  Take medications as prescribed and utilize CGM, for data to be discussed at future appointments, check BP and write down for future discussion Follow Up Plan: Telephone follow up appointment with care management team member scheduled for: 2 months      Medication Assistance:  Rybelsus & tresiba obtained through [employer] medication assistance program.  Enrollment ends TBD.  Also provided application for  entresto, pending status update.  Patient's preferred pharmacy is:  [place], [place] - [place] [address] [place] [address] Phone: [phone] Fax: [phone]   Uses pill box? Yes Pt endorses 100% compliance  Follow Up:  Patient agrees to Care Plan and Follow-up.  Plan: Telephone follow up appointment with care management team member scheduled for:  2 months  Larinda Buttery, PharmD Clinical Pharmacist [facility] [phone]

## 2022-04-18 ENCOUNTER — Ambulatory Visit (HOSPITAL_BASED_OUTPATIENT_CLINIC_OR_DEPARTMENT_OTHER)
Admission: RE | Admit: 2022-04-18 | Discharge: 2022-04-18 | Disposition: A | Discharge status: Home / Self Care | Payer: Medicare Other | Source: Ambulatory Visit | Attending: Cardiology | Admitting: Cardiology

## 2022-04-18 DIAGNOSIS — I447 Left bundle-branch block, unspecified: Secondary | ICD-10-CM | POA: Diagnosis not present

## 2022-04-18 DIAGNOSIS — I429 Cardiomyopathy, unspecified: Secondary | ICD-10-CM

## 2022-04-18 DIAGNOSIS — E119 Type 2 diabetes mellitus without complications: Secondary | ICD-10-CM | POA: Insufficient documentation

## 2022-04-18 DIAGNOSIS — I080 Rheumatic disorders of both mitral and aortic valves: Secondary | ICD-10-CM | POA: Insufficient documentation

## 2022-04-18 DIAGNOSIS — R0600 Dyspnea, unspecified: Secondary | ICD-10-CM | POA: Diagnosis not present

## 2022-04-18 DIAGNOSIS — I428 Other cardiomyopathies: Secondary | ICD-10-CM | POA: Insufficient documentation

## 2022-04-18 DIAGNOSIS — E785 Hyperlipidemia, unspecified: Secondary | ICD-10-CM | POA: Insufficient documentation

## 2022-04-18 LAB — ECHOCARDIOGRAM COMPLETE
Area-P 1/2: 1.96 cm2
S' Lateral: 3.1 cm

## 2022-04-18 NOTE — Patient Instructions (Signed)
Visit Information  Thank you for taking time to visit with me today. Please don't hesitate to contact me if I can be of assistance to you before our next scheduled telephone appointment.  Following are the goals we discussed today:  Patient Goals/Self-Care Activities Over the next 60 days, patient will:  Take medications as prescribed and utilize CGM, for data to be discussed at future appointments, check BP and write down for future discussion Follow Up Plan: Telephone follow up appointment with care management team member scheduled for: 2 months  Please call the care guide team at [phone] if you need to cancel or reschedule your appointment.   Patient verbalizes understanding of instructions and care plan provided today and agrees to view in [place]. Active MyChart status and patient understanding of how to access instructions and care plan via MyChart confirmed with patient.     Jillian Huynh

## 2022-04-18 NOTE — Progress Notes (Signed)
  Echocardiogram 2D Echocardiogram has been performed.  Jillian Huynh F 04/18/2022, 2:14 PM

## 2022-04-19 ENCOUNTER — Other Ambulatory Visit: Payer: Self-pay | Admitting: *Deleted

## 2022-04-19 DIAGNOSIS — I429 Cardiomyopathy, unspecified: Secondary | ICD-10-CM

## 2022-04-20 ENCOUNTER — Telehealth: Payer: Self-pay | Admitting: *Deleted

## 2022-04-20 ENCOUNTER — Other Ambulatory Visit: Payer: Self-pay | Admitting: *Deleted

## 2022-04-20 MED ORDER — ROPINIROLE HCL 4 MG PO TABS: 4.0000 mg | ORAL_TABLET | Freq: Every day | ORAL | 0 refills | Status: DC

## 2022-04-20 NOTE — Telephone Encounter (Signed)
Called pt and l informed her that her medication is here and that she hasn't picked up the previous shipment that was sent.    She said that she would come by today to pick this up.    She wanted to let Dr. Madilyn Fireman know that her restless legs have been bothering her for some time now and her husband told her to take an extra tablet last night. She said that it really helped. She wanted to know if Dr. Madilyn Fireman could up her dose of the Requip she is currently taking 3 mg.    I told her that I would fwd this to her for advice and let her know

## 2022-04-20 NOTE — Progress Notes (Signed)
Called Jillian Huynh and l informed her that her medication is here and that she hasn't picked up the previous shipment that was sent.   She said that she would come by today to pick this up.   She wanted to let Dr. Madilyn Fireman know that her restless legs have been bothering her for some time now and her husband told her to take an extra tablet last night. She said that it really helped. She wanted to know if Dr. Madilyn Fireman could up her dose of the Requip she is currently taking 3 mg.   I told her that I would fwd this to her for advice and let her know

## 2022-04-20 NOTE — Telephone Encounter (Signed)
Ropinirole dose increased to 4 mg but that is the max for restless leg.  So if that is not helping then she will need to make an appointment so we can discuss alternative options.

## 2022-04-20 NOTE — Telephone Encounter (Signed)
Called pt no answer or vm

## 2022-04-20 NOTE — Telephone Encounter (Signed)
Pt advised of recommendations.  

## 2022-04-25 ENCOUNTER — Encounter: Payer: Self-pay | Admitting: Internal Medicine

## 2022-04-25 ENCOUNTER — Ambulatory Visit: Payer: Medicare Other | Admitting: Internal Medicine

## 2022-04-25 VITALS — BP 110/80 | HR 70 | Ht 62.0 in | Wt 191.4 lb

## 2022-04-25 DIAGNOSIS — I447 Left bundle-branch block, unspecified: Secondary | ICD-10-CM | POA: Diagnosis not present

## 2022-04-25 NOTE — Patient Instructions (Signed)
Medication Instructions:  Your physician recommends that you continue on your current medications as directed. Please refer to the Current Medication list given to you today.  Labwork: None ordered.  Testing/Procedures: None ordered.  Follow-Up:  I will contact you regarding further testing versus ICD implant  Any Other Special Instructions Will Be Listed Below (If Applicable).  If you need a refill on your cardiac medications before your next appointment, please call your pharmacy.   Important Information About Sugar

## 2022-04-25 NOTE — Progress Notes (Signed)
HPI Jillian Huynh is referred today by Dr. London Sheer to consider Biv ICD insertion. She is a pleasant 76 yo woman with class 3 CHF and atypical LBBB, QRS duration of 150 ms. The patient has not had syncope. She has been on guideline directed medical therapy. A recent 2D echo was read as showing an EF of 50%. However, Dr. London Sheer thought that the EF was 30-35%. She denies chest pain. Minimal edema.  Allergies  Allergen Reactions   Bydureon [Exenatide] Nausea And Vomiting   Glipizide Other (See Comments)    Leg cramps, fatigue, and weight gain   Trulicity [Dulaglutide] Diarrhea   Invokana [Canagliflozin] Other (See Comments)    Yeast infection   Victoza [Liraglutide] Nausea Only and Other (See Comments)    Constipation,belching,bloating     Current Outpatient Medications  Medication Sig Dispense Refill   Apoaequorin (PREVAGEN PO) Take 1 tablet by mouth daily.     atorvastatin (LIPITOR) 40 MG tablet Take 1 tablet (40 mg total) by mouth daily. 90 tablet 3   B-D ULTRAFINE III SHORT PEN 31G X 8 MM MISC SUB-Q Daily 100 each 4   Calcium Carbonate-Vit D-Min (CALCIUM 1200 PO) Take 1 tablet by mouth in the morning and at bedtime.     Continuous Blood Gluc Sensor (FREESTYLE LIBRE 3 SENSOR) MISC 1 each by Does not apply route every 14 (fourteen) days. Sensors to be changed every 14 days. DX E11.8 2 each 5   ezetimibe (ZETIA) 10 MG tablet Take 1 tablet (10 mg total) by mouth daily. 90 tablet 3   FARXIGA 10 MG TABS tablet TAKE 1 TABLET BY MOUTH EVERY DAY BEFORE BREAKFAST 90 tablet 0   Insulin Degludec (TRESIBA [place]) Inject 42 Units into the skin daily.     metFORMIN (GLUCOPHAGE) 500 MG tablet Take 1 tablet (500 mg total) by mouth 2 (two) times daily with a meal. 180 tablet 1   metoprolol succinate (TOPROL XL) 25 MG 24 hr tablet Take 1 tablet (25 mg total) by mouth daily. 90 tablet 3   Omega-3 Fatty Acids (FISH OIL) 1200 MG CAPS Take 1 capsule by mouth daily.     ONETOUCH VERIO test strip DX DM E11.65. CHECK  FASTING BLOOD SUGAR EVERY MORNING AND 2 HOURS AFTER LARGE MEALS DAILY. CHECK 3 TIMES DAILY 300 strip PRN   pantoprazole (PROTONIX) 40 MG tablet TAKE 1 TABLET BY MOUTH EVERY DAY 90 tablet 0   Probiotic Product (PROBIOTIC PO) Take by mouth.     rOPINIRole (REQUIP) 4 MG tablet Take 1 tablet (4 mg total) by mouth daily. 90 tablet 0   sacubitril-valsartan (ENTRESTO) 97-103 MG Take 1 tablet by mouth 2 (two) times daily. 60 tablet 11   Semaglutide (RYBELSUS) 14 MG TABS Take 1 tablet by mouth daily.     spironolactone (ALDACTONE) 50 MG tablet Take 1 tablet (50 mg total) by mouth daily. 90 tablet 3   No current facility-administered medications for this visit.     Past Medical History:  Diagnosis Date   Diabetes mellitus without complication ([place])    Hyperlipidemia    Hypertension     ROS:   All systems reviewed and negative except as noted in the HPI.   Past Surgical History:  Procedure Laterality Date   CHOLECYSTECTOMY       Family History  Problem Relation Age of Onset   Diabetes Father    Cancer Father 22       Esophogeal Ca., Lung.   Hypertension  Other    Thyroid disease Other    Colon cancer Daughter 83   Glaucoma Mother    Hepatitis C Mother 46       Died from.   Glaucoma Sister    Glaucoma Sister    Glaucoma Maternal Aunt      Social History   Socioeconomic History   Marital status: Married    Spouse name: Biochemist, clinical   Number of children: 3   Years of education: 16.5   Highest education level: Some college, no degree  Occupational History   Occupation: Hair Stylist  Tobacco Use   Smoking status: Never   Smokeless tobacco: Never  Substance and Sexual Activity   Alcohol use: No    Alcohol/week: 0.0 standard drinks of alcohol   Drug use: No   Sexual activity: Yes    Partners: Male  Other Topics Concern   Not on file  Social History Narrative   Lives with her husband. She is working full time as a Probation officer. Two of her children live close by. One  is in [place]. One of her daughter's is deceased. Her hobbies include watch tv, listening to music and reading.    Social Determinants of Health   Financial Resource Strain: Low Risk  (07/22/2021)   Overall Financial Resource Strain (CARDIA)    Difficulty of Paying Living Expenses: Not hard at all  Food Insecurity: No Food Insecurity (07/22/2021)   Hunger Vital Sign    Worried About Running Out of Food in the Last Year: Never true    Ran Out of Food in the Last Year: Never true  Transportation Needs: No Transportation Needs (07/22/2021)   PRAPARE - Hydrologist (Medical): No    Lack of Transportation (Non-Medical): No  Physical Activity: Inactive (07/22/2021)   Exercise Vital Sign    Days of Exercise per Week: 0 days    Minutes of Exercise per Session: 0 min  Stress: No Stress Concern Present (07/22/2021)   [place]    Feeling of Stress : Not at all  Social Connections: Moderately Integrated (07/22/2021)   Social Connection and Isolation Panel [NHANES]    Frequency of Communication with Friends and Family: More than three times a week    Frequency of Social Gatherings with Friends and Family: More than three times a week    Attends Religious Services: Never    Marine scientist or Organizations: Yes    Attends Archivist Meetings: 1 to 4 times per year    Marital Status: Married  Human resources officer Violence: Not At Risk (07/22/2021)   Humiliation, Afraid, Rape, and Kick questionnaire    Fear of Current or Ex-Partner: No    Emotionally Abused: No    Physically Abused: No    Sexually Abused: No     BP 110/80   Pulse 70   Ht '5\' 2"'$  (1.575 m)   Wt 191 lb 6.4 oz (86.8 kg)   SpO2 96%   BMI 35.01 kg/m   Physical Exam:  Well appearing NAD HEENT: Unremarkable Neck:  No JVD, no thyromegally Lymphatics:  No adenopathy Back:  No CVA tenderness Lungs:  Clear with no  wheezes HEART:  Regular rate rhythm, no murmurs, no rubs, no clicks Abd:  soft, positive bowel sounds, no organomegally, no rebound, no guarding Ext:  2 plus pulses, no edema, no cyanosis, no clubbing Skin:  No rashes no nodules Neuro:  CN II through XII intact, motor grossly intact  EKG - nsr with atypical left bundle    Assess/Plan:  Chronic systolic heart failure - her symptoms are class 3 and she certainly appears to be limited more like someone with severe LV dysfunction. She is on GDMT. I have discussed with Dr. London Sheer and would recommend a cardiac MR as there is a descrepancy as to what the true EF is. If the MRI EF is less than or equal to 35%, Biv ICD would be reasonable.   Carleene Overlie Gabby Rackers,MD

## 2022-04-30 ENCOUNTER — Encounter: Payer: Self-pay | Admitting: *Deleted

## 2022-04-30 ENCOUNTER — Other Ambulatory Visit: Payer: Self-pay | Admitting: *Deleted

## 2022-04-30 DIAGNOSIS — I429 Cardiomyopathy, unspecified: Secondary | ICD-10-CM

## 2022-04-30 DIAGNOSIS — G4733 Obstructive sleep apnea (adult) (pediatric): Secondary | ICD-10-CM | POA: Diagnosis not present

## 2022-05-09 NOTE — Addendum Note (Signed)
Addended by: Beatrice Lecher D on: 05/09/2022 05:22 PM   Modules accepted: Orders

## 2022-05-11 DIAGNOSIS — E785 Hyperlipidemia, unspecified: Secondary | ICD-10-CM

## 2022-05-11 DIAGNOSIS — I10 Essential (primary) hypertension: Secondary | ICD-10-CM

## 2022-05-11 DIAGNOSIS — E1169 Type 2 diabetes mellitus with other specified complication: Secondary | ICD-10-CM

## 2022-05-14 ENCOUNTER — Other Ambulatory Visit (HOSPITAL_COMMUNITY): Payer: Medicare Other

## 2022-05-14 ENCOUNTER — Encounter (HOSPITAL_COMMUNITY): Payer: Self-pay | Admitting: Cardiology

## 2022-05-14 ENCOUNTER — Other Ambulatory Visit (HOSPITAL_COMMUNITY): Payer: Self-pay | Admitting: Cardiology

## 2022-05-14 ENCOUNTER — Ambulatory Visit (HOSPITAL_COMMUNITY)
Admission: RE | Admit: 2022-05-14 | Discharge: 2022-05-14 | Disposition: A | Discharge status: Home / Self Care | Payer: Medicare Other | Source: Ambulatory Visit | Attending: Cardiology | Admitting: Cardiology

## 2022-05-14 VITALS — BP 100/60 | HR 64 | Wt 184.4 lb

## 2022-05-14 DIAGNOSIS — Z800 Family history of malignant neoplasm of digestive organs: Secondary | ICD-10-CM | POA: Diagnosis not present

## 2022-05-14 DIAGNOSIS — Z7984 Long term (current) use of oral hypoglycemic drugs: Secondary | ICD-10-CM | POA: Insufficient documentation

## 2022-05-14 DIAGNOSIS — Z8249 Family history of ischemic heart disease and other diseases of the circulatory system: Secondary | ICD-10-CM | POA: Diagnosis not present

## 2022-05-14 DIAGNOSIS — E785 Hyperlipidemia, unspecified: Secondary | ICD-10-CM | POA: Insufficient documentation

## 2022-05-14 DIAGNOSIS — Z79899 Other long term (current) drug therapy: Secondary | ICD-10-CM | POA: Insufficient documentation

## 2022-05-14 DIAGNOSIS — F419 Anxiety disorder, unspecified: Secondary | ICD-10-CM | POA: Insufficient documentation

## 2022-05-14 DIAGNOSIS — I110 Hypertensive heart disease with heart failure: Secondary | ICD-10-CM | POA: Insufficient documentation

## 2022-05-14 DIAGNOSIS — Z833 Family history of diabetes mellitus: Secondary | ICD-10-CM | POA: Diagnosis not present

## 2022-05-14 DIAGNOSIS — I5022 Chronic systolic (congestive) heart failure: Secondary | ICD-10-CM | POA: Diagnosis not present

## 2022-05-14 DIAGNOSIS — I2510 Atherosclerotic heart disease of native coronary artery without angina pectoris: Secondary | ICD-10-CM | POA: Insufficient documentation

## 2022-05-14 DIAGNOSIS — R634 Abnormal weight loss: Secondary | ICD-10-CM | POA: Insufficient documentation

## 2022-05-14 DIAGNOSIS — G4733 Obstructive sleep apnea (adult) (pediatric): Secondary | ICD-10-CM | POA: Diagnosis not present

## 2022-05-14 DIAGNOSIS — Z8349 Family history of other endocrine, nutritional and metabolic diseases: Secondary | ICD-10-CM | POA: Insufficient documentation

## 2022-05-14 DIAGNOSIS — I428 Other cardiomyopathies: Secondary | ICD-10-CM | POA: Diagnosis not present

## 2022-05-14 LAB — BASIC METABOLIC PANEL
Anion gap: 9 (ref 5–15)
BUN: 26 mg/dL — ABNORMAL HIGH (ref 8–23)
CO2: 22 mmol/L (ref 22–32)
Calcium: 9.8 mg/dL (ref 8.9–10.3)
Chloride: 106 mmol/L (ref 98–111)
Creatinine, Ser: 0.83 mg/dL (ref 0.44–1.00)
GFR, Estimated: >60 mL/min (ref >60)
Glucose, Bld: 195 mg/dL — ABNORMAL HIGH (ref 70–99)
Potassium: 4.3 mmol/L (ref 3.5–5.1)
Sodium: 137 mmol/L (ref 135–145)

## 2022-05-14 LAB — BRAIN NATRIURETIC PEPTIDE: B Natriuretic Peptide: 38.7 pg/mL (ref 0.0–100.0)

## 2022-05-14 MED ORDER — DIAZEPAM 2 MG PO TABS: 2.0000 mg | ORAL_TABLET | Freq: Once | ORAL | 0 refills | Status: AC

## 2022-05-14 MED ORDER — METOPROLOL SUCCINATE ER 50 MG PO TB24: 50.0000 mg | ORAL_TABLET | Freq: Every day | ORAL | 3 refills | Status: DC

## 2022-05-14 NOTE — Progress Notes (Signed)
PCP: [name], MD Cardiology: Dr. Stanford Breed HF Cardiology: Dr. Aundra Dubin  76 y.o. with history of LBBB, HTN, and nonischemic cardiomyopathy was referred by Dr. Stanford Breed for evaluation of CHF.  Patient reports exertional dyspnea now for a couple of years.  She was evaluated by her PCP and referred to Dr. Stanford Breed in 11/22 for dyspnea and a LBBB noted by ECG. Echo in 11/22 showed EF in the 30% range by my review with normal RV, mild AI, mild MR, and IVC normal.  Coronary CTA to follow this showed mild nonobstructive CAD.  She has no history of ETOH or drugs.  She has no family history of cardiomyopathy that she knows of.  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    Repeat echo was done in 6/23, EF read as 50-55%, looks no more than about 40% to me on my review. She was referred to Dr. Lovena Le for possible CRT-D.   Patient returns for followup of CHF.  She has lost about 11 lbs, now getting semaglutide.  She is short of breath walking around a large store, but generally does fine walking short distances.  She is now on CPAP, feels like she is less tired.  No chest pain.  No orthopnea/PND.   Labs (12/22): K 4.3, creatinine 0.67, LDL 71, TGs 226 Labs (2/23): K 4.2, creatinine 0.71 Labs (4/23): K 4.1, creatinine 0.66, BNP 111  ECG (personally reviewed): NSR, LBBB 146 msec  PMH: 1. HTN 2. Hyperlipidemia 3. OSA: CPAP.  4. Chronic systolic CHF: Nonischemic cardiomyopathy.  - Echo (11/22): On my review, EF 30%, normal RV size and systolic function, mild MR, mild AI, IVC normal.  - No h/o ETOH.  - Coronary CTA: 162 Agatston units (72nd percentile), minimal LAD plaque.  - Echo (6/23): EF read as 50-55%, looks no more than about 40% to me on my review.  5. Chronic LBBB 6. ABIs (10/22): Normal.   Social History   Socioeconomic History   Marital status: Married    Spouse name: Biochemist, clinical   Number of children: 3   Years of education: 16.5   Highest education level: Some  college, no degree  Occupational History   Occupation: Hair Stylist  Tobacco Use   Smoking status: Never   Smokeless tobacco: Never  Substance and Sexual Activity   Alcohol use: No    Alcohol/week: 0.0 standard drinks of alcohol   Drug use: No   Sexual activity: Yes    Partners: Male  Other Topics Concern   Not on file  Social History Narrative   Lives with her husband. She is working full time as a Probation officer. Two of her children live close by. One is in [place]. One of her daughter's is deceased. Her hobbies include watch tv, listening to music and reading.    Social Determinants of Health   Financial Resource Strain: Low Risk  (07/22/2021)   Overall Financial Resource Strain (CARDIA)    Difficulty of Paying Living Expenses: Not hard at all  Food Insecurity: No Food Insecurity (07/22/2021)   Hunger Vital Sign    Worried About Running Out of Food in the Last Year: Never true    Ran Out of Food in the Last Year: Never true  Transportation Needs: No Transportation Needs (07/22/2021)   PRAPARE - Hydrologist (Medical): No    Lack of Transportation (Non-Medical): No  Physical Activity: Inactive (07/22/2021)   Exercise Vital Sign    Days  of Exercise per Week: 0 days    Minutes of Exercise per Session: 0 min  Stress: No Stress Concern Present (07/22/2021)   [place]    Feeling of Stress : Not at all  Social Connections: Moderately Integrated (07/22/2021)   Social Connection and Isolation Panel [NHANES]    Frequency of Communication with Friends and Family: More than three times a week    Frequency of Social Gatherings with Friends and Family: More than three times a week    Attends Religious Services: Never    Marine scientist or Organizations: Yes    Attends Archivist Meetings: 1 to 4 times per year    Marital Status: Married  Human resources officer Violence: Not At Risk  (07/22/2021)   Humiliation, Afraid, Rape, and Kick questionnaire    Fear of Current or Ex-Partner: No    Emotionally Abused: No    Physically Abused: No    Sexually Abused: No   Family History  Problem Relation Age of Onset   Diabetes Father    Cancer Father 4       Esophogeal Ca., Lung.   Hypertension Other    Thyroid disease Other    Colon cancer Daughter 16   Glaucoma Mother    Hepatitis C Mother 30       Died from.   Glaucoma Sister    Glaucoma Sister    Glaucoma Maternal Aunt    ROS: All systems reviewed and negative except as per HPI.   Current Outpatient Medications  Medication Sig Dispense Refill   Apoaequorin (PREVAGEN PO) Take 1 tablet by mouth daily.     atorvastatin (LIPITOR) 40 MG tablet Take 1 tablet (40 mg total) by mouth daily. 90 tablet 3   B-D ULTRAFINE III SHORT PEN 31G X 8 MM MISC SUB-Q Daily 100 each 4   Calcium Carbonate-Vit D-Min (CALCIUM 1200 PO) Take 1 tablet by mouth in the morning and at bedtime.     Continuous Blood Gluc Sensor (FREESTYLE LIBRE 3 SENSOR) MISC 1 each by Does not apply route every 14 (fourteen) days. Sensors to be changed every 14 days. DX E11.8 2 each 5   ezetimibe (ZETIA) 10 MG tablet Take 1 tablet (10 mg total) by mouth daily. 90 tablet 3   FARXIGA 10 MG TABS tablet TAKE 1 TABLET BY MOUTH EVERY DAY BEFORE BREAKFAST 90 tablet 0   metFORMIN (GLUCOPHAGE) 500 MG tablet Take 1 tablet (500 mg total) by mouth 2 (two) times daily with a meal. 180 tablet 1   Omega-3 Fatty Acids (FISH OIL) 1200 MG CAPS Take 1 capsule by mouth daily.     ONETOUCH VERIO test strip DX DM E11.65. CHECK FASTING BLOOD SUGAR EVERY MORNING AND 2 HOURS AFTER LARGE MEALS DAILY. CHECK 3 TIMES DAILY 300 strip PRN   pantoprazole (PROTONIX) 40 MG tablet TAKE 1 TABLET BY MOUTH EVERY DAY 90 tablet 0   Probiotic Product (PROBIOTIC PO) Take by mouth.     rOPINIRole (REQUIP) 4 MG tablet Take 1 tablet (4 mg total) by mouth daily. 90 tablet 0   sacubitril-valsartan (ENTRESTO)  97-103 MG Take 1 tablet by mouth 2 (two) times daily. 60 tablet 11   Semaglutide (RYBELSUS) 14 MG TABS Take 1 tablet by mouth daily.     spironolactone (ALDACTONE) 50 MG tablet Take 1 tablet (50 mg total) by mouth daily. 90 tablet 3   diazepam (VALIUM) 2 MG tablet Take 1 tablet (2  mg total) by mouth once for 1 dose. Take 1 hr prior to MRI 1 tablet 0   Insulin Degludec (TRESIBA [place]) Inject 42 Units into the skin daily.     metoprolol succinate (TOPROL XL) 50 MG 24 hr tablet Take 1 tablet (50 mg total) by mouth daily. 90 tablet 3   No current facility-administered medications for this encounter.   BP 100/60   Pulse 64   Wt 83.6 kg (184 lb 6.4 oz)   SpO2 97%   BMI 33.73 kg/m  General: NAD Neck: No JVD, no thyromegaly or thyroid nodule.  Lungs: Clear to auscultation bilaterally with normal respiratory effort. CV: Nondisplaced PMI.  Heart regular S1/S2, no S3/S4, no murmur.  No peripheral edema.  No carotid bruit.  Normal pedal pulses.  Abdomen: Soft, nontender, no hepatosplenomegaly, no distention.  Skin: Intact without lesions or rashes.  Neurologic: Alert and oriented x 3.  Psych: Normal affect. Extremities: No clubbing or cyanosis.  HEENT: Normal.   Assessment/Plan: 1. Chronic systolic CHF: Nonischemic cardiomyopathy.  Coronary CTA in 11/22 with mild nonobstructive CAD.  Echo in 11/22 showed on my review EF 30%, normal RV size and systolic function, mild MR, mild AI, IVC normal. No ETOH.  No FH of cardiomyopathy.  No arrhythmias have been noted.  She may have a LBBB cardiomyopathy.  Repeat echo in 6/23 had EF read as 50-55%, looks no more than about 40% to me on my review. NYHA class II symptoms with 11 lbs weight loss, some improvement.  She is not volume overloaded on exam today.  - She tolerated Coreg poorly, now on Toprol XL with no problems.  Increase Toprol XL to 50 mg daily.  - Continue dapagliflozin 10 mg daily.  - Continue spironolactone 25 mg daily, BMET/BNP today.  - Continue  Entresto 97/103 bid.   - With borderline EF by echo, will arrange for cardiac MRI.  If EF < 35%, she will need CRT-D device (LBBB).  I will give her low dose valium to take prior to MRI due to claustrophobia/anxiety.  2. HTN: BP controlled.  3. OSA: Continue CPAP.   Followup 3 months with APP.   Loralie Champagne 05/14/2022

## 2022-05-14 NOTE — Patient Instructions (Signed)
Labs done today. We will contact you only if your labs are abnormal.  INCREASE Metoprolol XL to '50mg'$  (1 tablet) by mouth daily.   Take Valium '2mg'$  (1 tablet) by mouth 1 hour prior to MRI.   No other medication changes were made. Please continue all current medications as prescribed.  Your physician recommends that you schedule a follow-up appointment in: 3 months with our NP/[facility] Clinic here in our office.   If you have any questions or concerns before your next appointment please send [place] a message through [name] or call our office at [phone].    TO LEAVE A MESSAGE FOR THE NURSE SELECT OPTION 2, PLEASE LEAVE A MESSAGE INCLUDING: YOUR NAME DATE OF BIRTH CALL BACK NUMBER REASON FOR CALL**this is important as we prioritize the call backs  YOU WILL RECEIVE A CALL BACK THE SAME DAY AS LONG AS YOU CALL BEFORE 4:00 PM   Do the following things EVERYDAY: Weigh yourself in the morning before breakfast. Write it down and keep it in a log. Take your medicines as prescribed Eat low salt foods--Limit salt (sodium) to 2000 mg per day.  Stay as active as you can everyday Limit all fluids for the day to less than 2 liters   At the [facility], you and your health needs are our priority. As part of our continuing mission to provide you with exceptional heart care, we have created designated Provider Care Teams. These Care Teams include your primary Cardiologist (physician) and Advanced Practice Providers (APPs- Physician Assistants and Nurse Practitioners) who all work together to provide you with the care you need, when you need it.   You may see any of the following providers on your designated Care Team at your next follow up: Dr Glori Bickers Dr Haynes Kerns, NP Lyda Jester, [place] Audry Riles, PharmD   Please be sure to bring in all your medications bottles to every appointment.

## 2022-05-17 ENCOUNTER — Telehealth: Payer: Self-pay | Admitting: *Deleted

## 2022-05-17 NOTE — Telephone Encounter (Signed)
Called pt and notified her that her Tyler Aas is here for p/u. She stated that she will get this on Monday at her appointment.

## 2022-05-21 ENCOUNTER — Encounter: Payer: Self-pay | Admitting: Family Medicine

## 2022-05-21 ENCOUNTER — Ambulatory Visit (INDEPENDENT_AMBULATORY_CARE_PROVIDER_SITE_OTHER): Payer: Medicare Other | Admitting: Family Medicine

## 2022-05-21 VITALS — BP 99/50 | HR 65 | Ht 62.0 in | Wt 188.0 lb

## 2022-05-21 DIAGNOSIS — I10 Essential (primary) hypertension: Secondary | ICD-10-CM | POA: Diagnosis not present

## 2022-05-21 DIAGNOSIS — I5020 Unspecified systolic (congestive) heart failure: Secondary | ICD-10-CM | POA: Insufficient documentation

## 2022-05-21 DIAGNOSIS — I429 Cardiomyopathy, unspecified: Secondary | ICD-10-CM

## 2022-05-21 DIAGNOSIS — I5022 Chronic systolic (congestive) heart failure: Secondary | ICD-10-CM

## 2022-05-21 DIAGNOSIS — E118 Type 2 diabetes mellitus with unspecified complications: Secondary | ICD-10-CM | POA: Diagnosis not present

## 2022-05-21 LAB — POCT GLYCOSYLATED HEMOGLOBIN (HGB A1C): Hemoglobin A1C: 8.1 % — AB (ref 4.0–5.6)

## 2022-05-21 MED ORDER — DEXCOM G7 SENSOR MISC: 1 refills | Status: DC

## 2022-05-21 NOTE — Progress Notes (Unsigned)
Established Patient Office Visit  Subjective   Patient ID: Jillian Huynh, female    DOB: Sep 13, 1946  Age: 76 y.o. MRN: [id number]  Chief Complaint  Patient presents with   Diabetes    HPI  Hypertension- Pt denies chest pain, SOB, dizziness, or heart palpitations.  Taking meds as directed w/o problems.  Denies medication side effects.    Diabetes - no hypoglycemic events. No wounds or sores that are not healing well. No increased thirst or urination. Checking glucose at home. Taking medications as prescribed without any side effects. Getting her Tyler Aas today that came in from patient assistance.  She says that her insurance will cover Dexcom better than the freestyle libre she is currently using would like a new prescription sent to the pharmacy for this.  She has been following closely with cardiology for her systolic congestive heart failure and cardiomyopathy.  She has a cardiac MRI scheduled for next month in August. She is doing well but just feels really fatigued all the time she says she has been taking her medications regularly.    {History (Optional):23778}  ROS    Objective:     BP (!) 99/50   Pulse 65   Ht '5\' 2"'$  (1.575 m)   Wt 188 lb (85.3 kg)   SpO2 98%   BMI 34.39 kg/m  {Vitals History (Optional):23777}  Physical Exam Vitals and nursing note reviewed.  Constitutional:      Appearance: She is well-developed.  HENT:     Head: Normocephalic and atraumatic.  Cardiovascular:     Rate and Rhythm: Normal rate and regular rhythm.     Heart sounds: Normal heart sounds.  Pulmonary:     Effort: Pulmonary effort is normal.     Breath sounds: Normal breath sounds.  Skin:    General: Skin is warm and dry.  Neurological:     Mental Status: She is alert and oriented to person, place, and time.  Psychiatric:        Behavior: Behavior normal.      Results for orders placed or performed in visit on 05/21/22  POCT glycosylated hemoglobin (Hb A1C)  Result Value Ref  Range   Hemoglobin A1C 8.1 (A) 4.0 - 5.6 %   HbA1c POC (<> result, manual entry)     HbA1c, POC (prediabetic range)     HbA1c, POC (controlled diabetic range)      {Labs (Optional):23779}  The ASCVD Risk score (Arnett DK, et al., 2019) failed to calculate for the following reasons:   The valid total cholesterol range is 130 to 320 mg/dL    Assessment & Plan:   Problem List Items Addressed This Visit       Cardiovascular and Mediastinum   Systolic CHF ([place])   Cardiomyopathy ([place])   BP (high blood pressure)   Relevant Orders   AMB Referral to [facility] Coordinaton     Endocrine   Diabetes mellitus type 2, controlled ([place]) - Primary    Not maximally controlled.  Though she is doing well on her regimen and A1c is down slightly.  But I really want that A1c under 8.  We talked through the importance of getting the A1c down under 8 she said she did not realize some of the potential complications.  She said physically she did not notice much of a difference and so did not understand some of the importance to it.  But says she will continue to work on it she admits that she still  has a lot of room to improve her diet and knows she needs to work on cutting out bread.. Continue current regimen. Follow up in  3-4 months.    Lab Results  Component Value Date   HGBA1C 8.1 (A) 05/21/2022         Relevant Medications   Continuous Blood Gluc Sensor (DEXCOM G7 SENSOR) MISC   Other Relevant Orders   POCT glycosylated hemoglobin (Hb A1C) (Completed)   POCT UA - Microalbumin   AMB Referral to [place]    Referral to CCM team was made today for assistance with management of their chronic conditions.  High risk identifiers were used to determine the eligibility of the chronic conditions for this service. I have discussed the details of Chronic Care Management services with Ms. Zweber which include the following:  CCM service includes personalized support from designated  clinical staff supervised by myself, including individualized plan of care and coordination with other care providers. 24/7 contact phone numbers for assistance for urgent and routine care needs. Service will only be billed when office clinical staff spend 20 minutes or more in a month to coordinate care. Only one practitioner may furnish and bill the service in a calendar month. The patient may stop CCM services at any time (effective at the end of the month) by phone call to the office staff. The patient may have a co-pay required by the individual health plan.    Goals Addressed             This Visit's Progress    CCM Expected Outcome:  Monitor, Self-Manage and Reduce Symptoms of Heart Failure       CCM:  Monitor and Maintain HbA1c <8%           Return in about 14 weeks (around 08/27/2022) for Diabetes follow-up.    Beatrice Lecher, MD

## 2022-05-21 NOTE — Assessment & Plan Note (Addendum)
Not maximally controlled.  Though she is doing well on her regimen and A1c is down slightly.  But I really want that A1c under 8.  We talked through the importance of getting the A1c down under 8 she said she did not realize some of the potential complications.  She said physically she did not notice much of a difference and so did not understand some of the importance to it.  But says she will continue to work on it she admits that she still has a lot of room to improve her diet and knows she needs to work on cutting out bread.. Continue current regimen. Follow up in  3-4 months.    Lab Results  Component Value Date   HGBA1C 8.1 (A) 05/21/2022

## 2022-05-22 ENCOUNTER — Telehealth: Payer: Self-pay

## 2022-05-22 DIAGNOSIS — E118 Type 2 diabetes mellitus with unspecified complications: Secondary | ICD-10-CM

## 2022-05-22 MED ORDER — DEXCOM G7 SENSOR MISC: 1 refills | Status: DC

## 2022-05-22 NOTE — Telephone Encounter (Signed)
Pharmacy called stating that RX for Dexcom G7 sensors are to be replaced every 10 days.  They received RX for every 14 days.  Corrected RX sent to pharmacy.  Charyl Bigger, CMA

## 2022-05-30 DIAGNOSIS — G4733 Obstructive sleep apnea (adult) (pediatric): Secondary | ICD-10-CM | POA: Diagnosis not present

## 2022-06-11 ENCOUNTER — Other Ambulatory Visit: Payer: Self-pay | Admitting: Family Medicine

## 2022-06-11 DIAGNOSIS — Z1231 Encounter for screening mammogram for malignant neoplasm of breast: Secondary | ICD-10-CM

## 2022-06-13 ENCOUNTER — Ambulatory Visit (INDEPENDENT_AMBULATORY_CARE_PROVIDER_SITE_OTHER): Payer: Medicare Other

## 2022-06-13 DIAGNOSIS — Z1231 Encounter for screening mammogram for malignant neoplasm of breast: Secondary | ICD-10-CM

## 2022-06-13 IMAGING — MG MM DIGITAL SCREENING BILAT W/ TOMO AND CAD
6 of 10 series · 6 of 30 positions shown · non-contrast
Comparison: Previous exam(s).

CLINICAL DATA: Screening.

EXAM:
DIGITAL SCREENING BILATERAL MAMMOGRAM WITH TOMOSYNTHESIS AND CAD
TECHNIQUE: Bilateral screening digital craniocaudal and mediolateral oblique
mammograms were obtained. Bilateral screening digital breast
tomosynthesis was performed. The images were evaluated with
computer-aided detection.

[L CC synth-2D (1 of 2)]
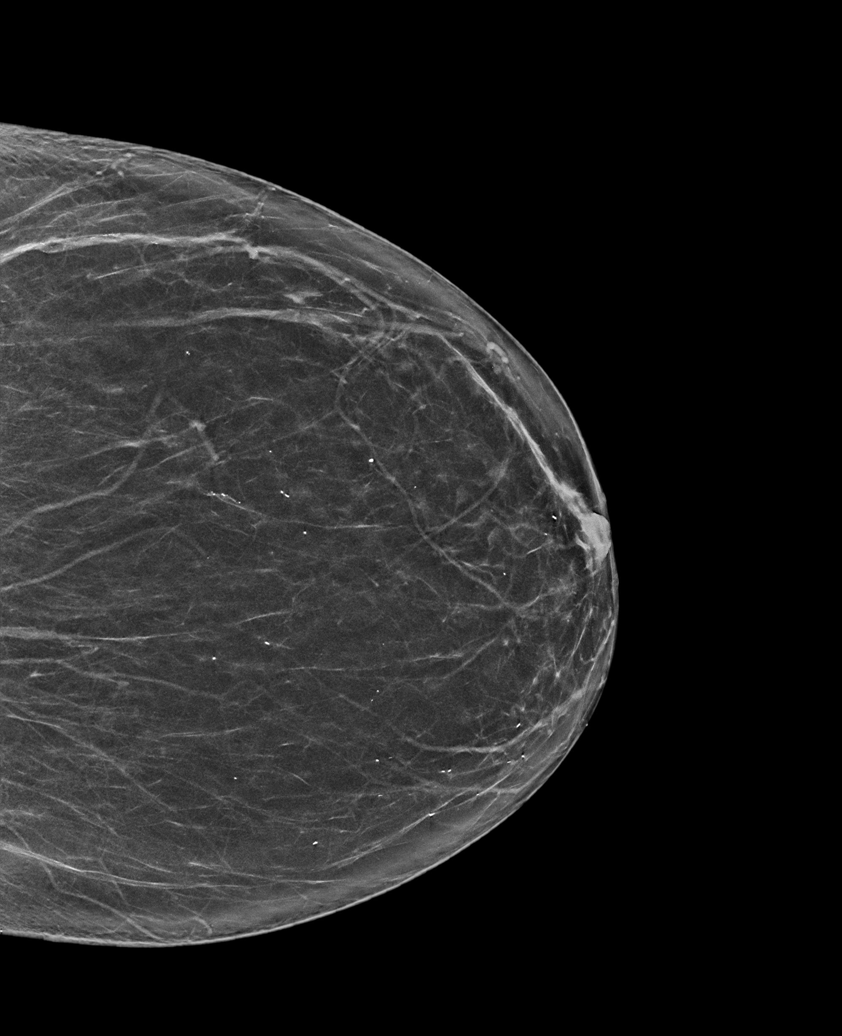

[R CC synth-2D]
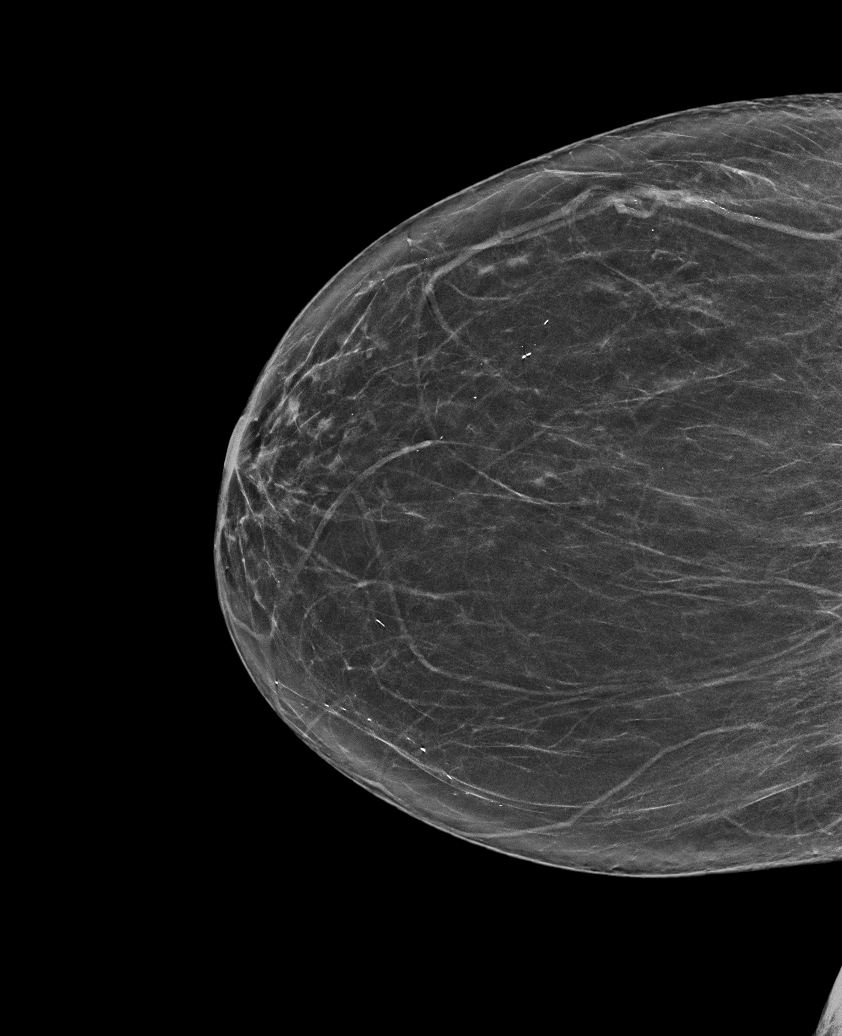

[L MLO synth-2D]
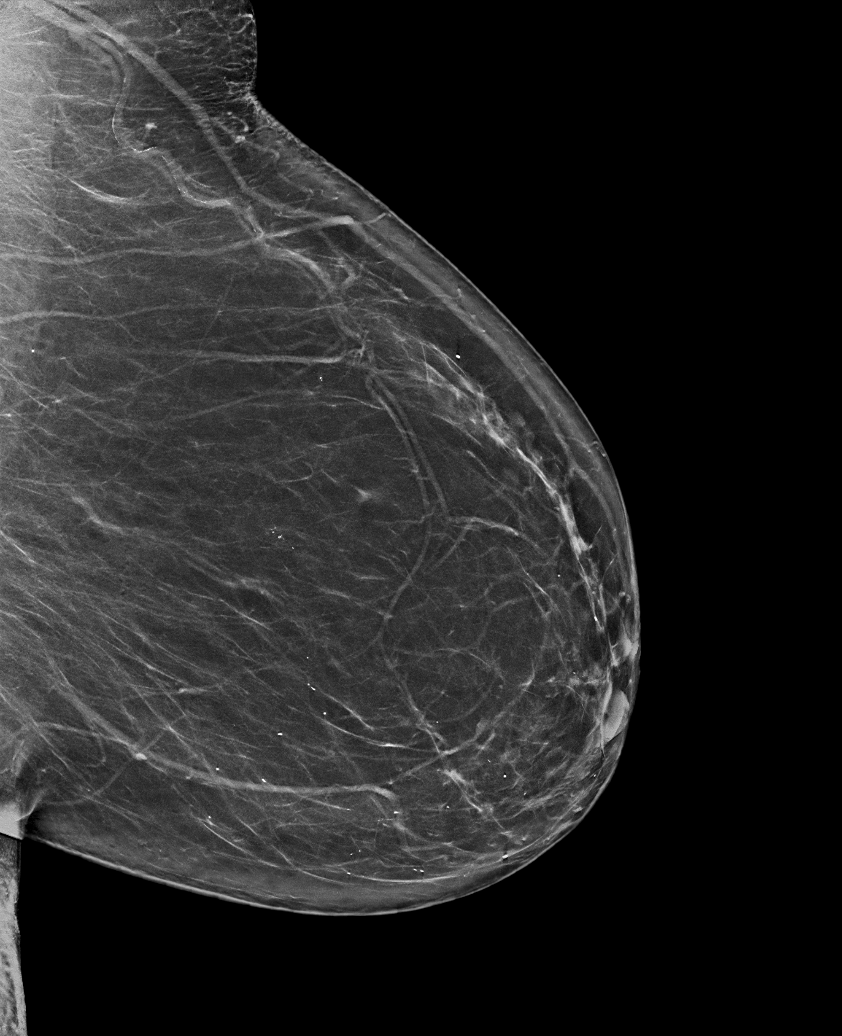

[L CC synth-2D (2 of 2)]
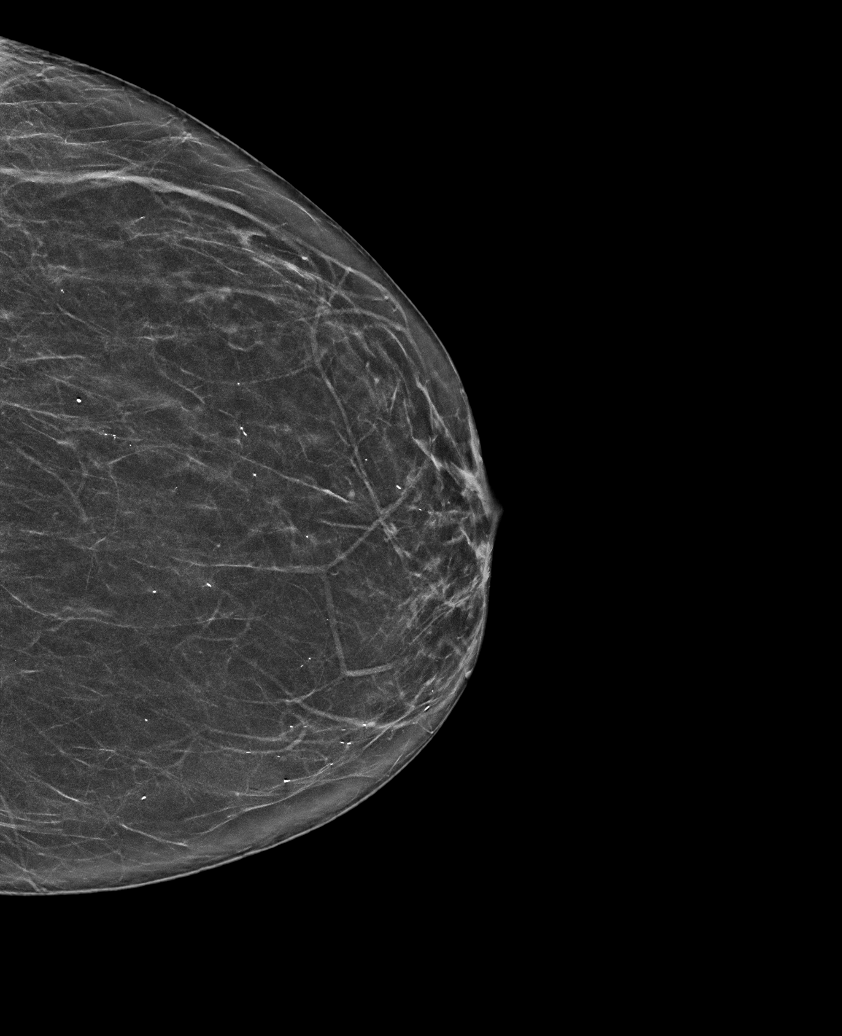

[R MLO synth-2D]
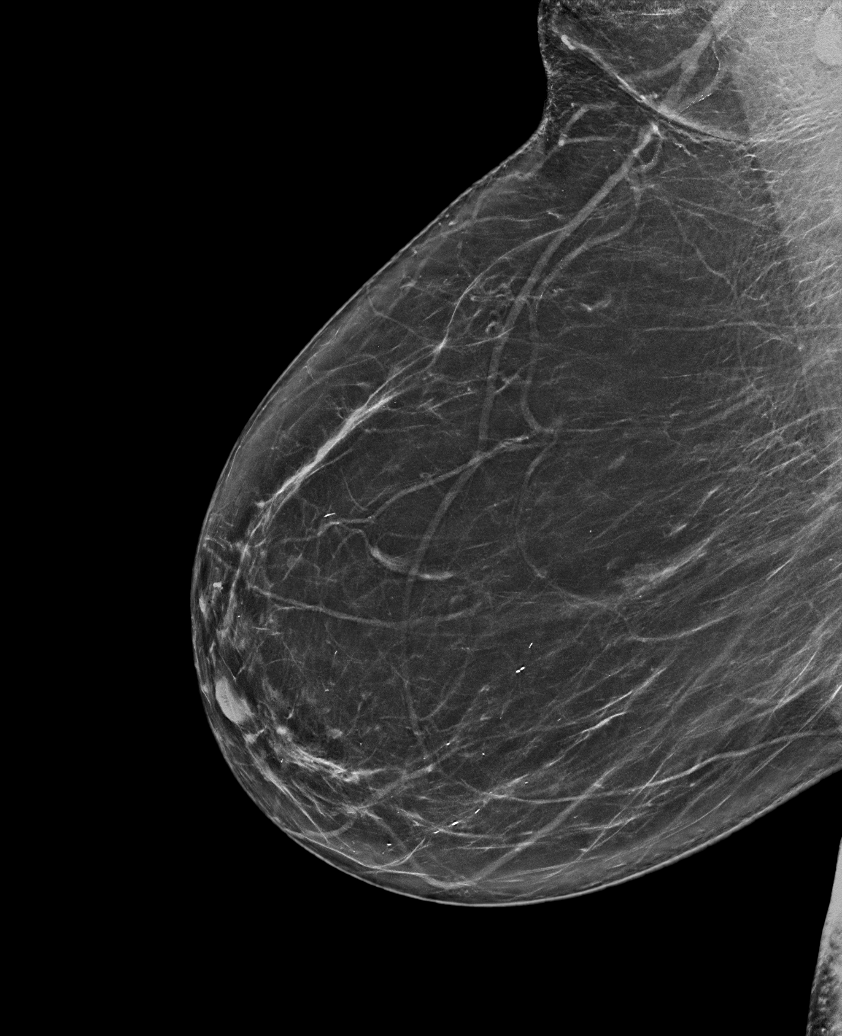

[R MLO tomo · tomo slice 39/76.0]
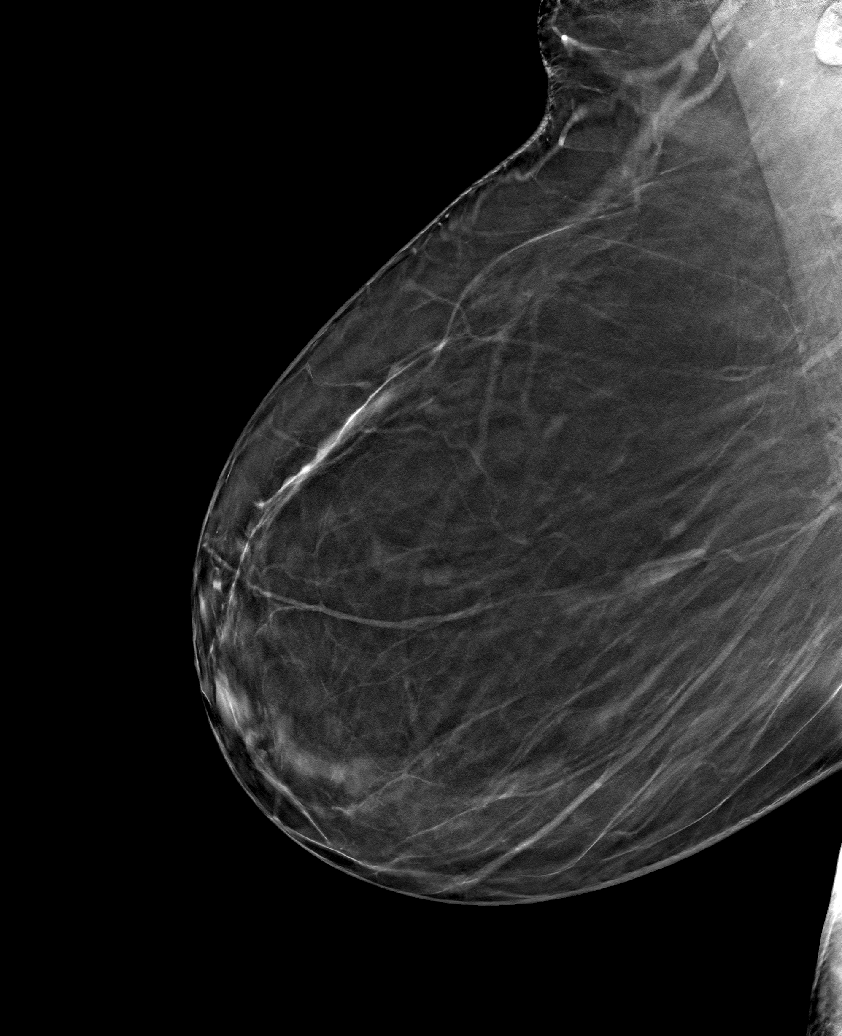

[6 of 30 positions shown; findings below may reference images not displayed]

ACR Breast Density Category b: There are scattered areas of
fibroglandular density.
FINDINGS: There are no findings suspicious for malignancy.
IMPRESSION: No mammographic evidence of malignancy. A result letter of this
screening mammogram will be mailed directly to the patient.

RECOMMENDATION:
Screening mammogram in one year. (Code:51-O-LD2)

BI-RADS CATEGORY  1: Negative.

## 2022-06-14 ENCOUNTER — Other Ambulatory Visit: Payer: Self-pay | Admitting: Family Medicine

## 2022-06-14 DIAGNOSIS — E118 Type 2 diabetes mellitus with unspecified complications: Secondary | ICD-10-CM

## 2022-06-15 ENCOUNTER — Other Ambulatory Visit: Payer: Self-pay

## 2022-06-15 NOTE — Progress Notes (Signed)
Please call patient. Normal mammogram.  Repeat in 1 year.  

## 2022-06-15 NOTE — Progress Notes (Signed)
HPI: FU CHF.  ABIs October 2022 normal.  Echocardiogram November 2022 showed septal hypokinesis, ejection fraction 45 to 50%, moderate left ventricular hypertrophy, grade 1 diastolic dysfunction, moderate mitral regurgitation, mild aortic insufficiency.  I personally reviewed the patient's echocardiogram and feel the ejection fraction is approximately 30%.  The mitral regurgitation appeared to be mild.  Coronary CTA November 2022 showed calcium score 162 which was 70th percentile, minimal calcified plaque in the LAD and circumflex.  Follow-up echocardiogram June 2023 interpreted as ejection fraction 50 to 56%, grade 1 diastolic dysfunction, mild mitral regurgitation, mild aortic insufficiency.  I did review this and felt ejection fraction was 30 to 35%.  Patient was seen by Dr. Lovena Le and a cardiac MRI is scheduled later this month.  If ejection fraction less than 35% she will need CRT-D.  Since last seen, she continues to have fatigue.  She denies increased dyspnea, chest pain, palpitations, syncope or pedal edema.  Current Outpatient Medications  Medication Sig Dispense Refill   Apoaequorin (PREVAGEN PO) Take 1 tablet by mouth daily.     atorvastatin (LIPITOR) 40 MG tablet Take 1 tablet (40 mg total) by mouth daily. 90 tablet 3   B-D ULTRAFINE III SHORT PEN 31G X 8 MM MISC SUB-Q Daily 100 each 4   Calcium Carbonate-Vit D-Min (CALCIUM 1200 PO) Take 1 tablet by mouth in the morning and at bedtime.     Continuous Blood Gluc Sensor (DEXCOM G7 SENSOR) MISC Apply every 10 days. 9 each 1   Continuous Blood Gluc Sensor (FREESTYLE LIBRE 3 SENSOR) MISC 1 each by Does not apply route every 14 (fourteen) days. Sensors to be changed every 14 days. DX E11.8 2 each 5   ezetimibe (ZETIA) 10 MG tablet Take 1 tablet (10 mg total) by mouth daily. 90 tablet 3   Insulin Degludec (TRESIBA [place]) Inject 42 Units into the skin daily.     metFORMIN (GLUCOPHAGE) 500 MG tablet Take 1 tablet (500 mg total) by mouth 2 (two)  times daily with a meal. 180 tablet 1   metoprolol succinate (TOPROL XL) 50 MG 24 hr tablet Take 1 tablet (50 mg total) by mouth daily. 90 tablet 3   Omega-3 Fatty Acids (FISH OIL) 1200 MG CAPS Take 1 capsule by mouth daily.     ONETOUCH VERIO test strip DX DM E11.65. CHECK FASTING BLOOD SUGAR EVERY MORNING AND 2 HOURS AFTER LARGE MEALS DAILY. CHECK 3 TIMES DAILY 300 strip PRN   Probiotic Product (PROBIOTIC PO) Take by mouth.     rOPINIRole (REQUIP) 4 MG tablet Take 1 tablet (4 mg total) by mouth daily. 90 tablet 0   sacubitril-valsartan (ENTRESTO) 97-103 MG Take 1 tablet by mouth 2 (two) times daily. 60 tablet 11   Semaglutide (RYBELSUS) 14 MG TABS Take 1 tablet by mouth daily.     spironolactone (ALDACTONE) 50 MG tablet Take 1 tablet (50 mg total) by mouth daily. 90 tablet 3   No current facility-administered medications for this visit.     Past Medical History:  Diagnosis Date   Diabetes mellitus without complication ([place])    Hyperlipidemia    Hypertension     Past Surgical History:  Procedure Laterality Date   CHOLECYSTECTOMY      Social History   Socioeconomic History   Marital status: Married    Spouse name: Biochemist, clinical   Number of children: 3   Years of education: 16.5   Highest education level: Some college, no degree  Occupational History   Occupation: Hair Stylist  Tobacco Use   Smoking status: Never   Smokeless tobacco: Never  Substance and Sexual Activity   Alcohol use: No    Alcohol/week: 0.0 standard drinks of alcohol   Drug use: No   Sexual activity: Yes    Partners: Male  Other Topics Concern   Not on file  Social History Narrative   Lives with her husband. She is working full time as a Probation officer. Two of her children live close by. One is in [place]. One of her daughter's is deceased. Her hobbies include watch tv, listening to music and reading.    Social Determinants of Health   Financial Resource Strain: Low Risk  (07/22/2021)   Overall  Financial Resource Strain (CARDIA)    Difficulty of Paying Living Expenses: Not hard at all  Food Insecurity: No Food Insecurity (07/22/2021)   Hunger Vital Sign    Worried About Running Out of Food in the Last Year: Never true    Ran Out of Food in the Last Year: Never true  Transportation Needs: No Transportation Needs (07/22/2021)   PRAPARE - Hydrologist (Medical): No    Lack of Transportation (Non-Medical): No  Physical Activity: Inactive (07/22/2021)   Exercise Vital Sign    Days of Exercise per Week: 0 days    Minutes of Exercise per Session: 0 min  Stress: No Stress Concern Present (07/22/2021)   [place]    Feeling of Stress : Not at all  Social Connections: Moderately Integrated (07/22/2021)   Social Connection and Isolation Panel [NHANES]    Frequency of Communication with Friends and Family: More than three times a week    Frequency of Social Gatherings with Friends and Family: More than three times a week    Attends Religious Services: Never    Marine scientist or Organizations: Yes    Attends Archivist Meetings: 1 to 4 times per year    Marital Status: Married  Human resources officer Violence: Not At Risk (07/22/2021)   Humiliation, Afraid, Rape, and Kick questionnaire    Fear of Current or Ex-Partner: No    Emotionally Abused: No    Physically Abused: No    Sexually Abused: No    Family History  Problem Relation Age of Onset   Diabetes Father    Cancer Father 72       Esophogeal Ca., Lung.   Hypertension Other    Thyroid disease Other    Colon cancer Daughter 85   Glaucoma Mother    Hepatitis C Mother 33       Died from.   Glaucoma Sister    Glaucoma Sister    Glaucoma Maternal Aunt     ROS: Fatigue but no fevers or chills, productive cough, hemoptysis, dysphasia, odynophagia, melena, hematochezia, dysuria, hematuria, rash, seizure activity, orthopnea,  PND, pedal edema, claudication. Remaining systems are negative.  Physical Exam: Well-developed well-nourished in no acute distress.  Skin is warm and dry.  HEENT is normal.  Neck is supple.  Chest is clear to auscultation with normal expansion.  Cardiovascular exam is regular rate and rhythm.  Abdominal exam nontender or distended. No masses palpated. Extremities show no edema. neuro grossly intact  A/P  1 nonischemic cardiomyopathy-etiology is unclear.  CTA did not show significant obstructive coronary disease.  No history of alcohol abuse.  It is possible that her cardiomyopathy is related  to left bundle branch block/dyssynchrony.  Continue Entresto, Toprol (patient did not tolerate carvedilol), Farxiga and spironolactone.  Plan is to proceed with cardiac MRI later this month.  If ejection fraction less than 35% would benefit from CRT-D.  2 chronic systolic congestive heart failure-patient is euvolemic.  Continue spironolactone at present dose.  3 hypertension-blood pressure controlled.  Continue present medications.  4 hyperlipidemia-continue statin.  5 mild regurgitation-mild on my review.  Kirk Ruths, MD

## 2022-06-27 ENCOUNTER — Ambulatory Visit: Payer: Medicare Other | Admitting: Cardiology

## 2022-06-27 ENCOUNTER — Encounter: Payer: Self-pay | Admitting: Cardiology

## 2022-06-27 VITALS — BP 92/58 | HR 67 | Ht 62.0 in | Wt 187.1 lb

## 2022-06-27 DIAGNOSIS — I429 Cardiomyopathy, unspecified: Secondary | ICD-10-CM | POA: Diagnosis not present

## 2022-06-27 DIAGNOSIS — I447 Left bundle-branch block, unspecified: Secondary | ICD-10-CM | POA: Diagnosis not present

## 2022-06-27 DIAGNOSIS — I10 Essential (primary) hypertension: Secondary | ICD-10-CM | POA: Diagnosis not present

## 2022-06-27 DIAGNOSIS — I5022 Chronic systolic (congestive) heart failure: Secondary | ICD-10-CM

## 2022-06-27 NOTE — Patient Instructions (Signed)
Medication Instructions:   Your physician recommends that you continue on your current medications as directed. Please refer to the Current Medication list given to you today.   *If you need a refill on your cardiac medications before your next appointment, please call your pharmacy*   Lab Work:   None ordered. If you have labs (blood work) drawn today and your tests are completely normal, you will receive your results only by: [place] (if you have MyChart) OR A paper copy in the mail If you have any lab test that is abnormal or we need to change your treatment, we will call you to review the results.   Testing/Procedures:  None ordered.   Follow-Up: At [facility], you and your health needs are our priority.  As part of our continuing mission to provide you with exceptional heart care, we have created designated Provider Care Teams.  These Care Teams include your primary Cardiologist (physician) and Advanced Practice Providers (APPs -  Physician Assistants and Nurse Practitioners) who all work together to provide you with the care you need, when you need it.  We recommend signing up for the patient portal called "MyChart".  Sign up information is provided on this After Visit Summary.  MyChart is used to connect with patients for Virtual Visits (Telemedicine).  Patients are able to view lab/test results, encounter notes, upcoming appointments, etc.  Non-urgent messages can be sent to your provider as well.   To learn more about what you can do with MyChart, go to [email or web address].    Your next appointment:   3 month(s)  The format for your next appointment:   In Person  Provider:   Kirk Ruths, MD    Important Information About Sugar

## 2022-06-28 ENCOUNTER — Telehealth: Payer: Self-pay | Admitting: Family Medicine

## 2022-06-28 NOTE — Telephone Encounter (Signed)
Received 4 boxes of Rybelsus 14 mg for patient  on 06/28/2022 at 10/16 am. I will call and let her know they are here to be picked up

## 2022-06-29 NOTE — Telephone Encounter (Signed)
Patient picked medication up on 8/17 - CF

## 2022-06-30 DIAGNOSIS — G4733 Obstructive sleep apnea (adult) (pediatric): Secondary | ICD-10-CM | POA: Diagnosis not present

## 2022-07-09 ENCOUNTER — Telehealth (HOSPITAL_COMMUNITY): Payer: Self-pay | Admitting: *Deleted

## 2022-07-09 ENCOUNTER — Ambulatory Visit (INDEPENDENT_AMBULATORY_CARE_PROVIDER_SITE_OTHER): Payer: Medicare Other | Admitting: Pharmacist

## 2022-07-09 DIAGNOSIS — E118 Type 2 diabetes mellitus with unspecified complications: Secondary | ICD-10-CM

## 2022-07-09 DIAGNOSIS — E1169 Type 2 diabetes mellitus with other specified complication: Secondary | ICD-10-CM

## 2022-07-09 DIAGNOSIS — I10 Essential (primary) hypertension: Secondary | ICD-10-CM

## 2022-07-09 NOTE — Telephone Encounter (Signed)
Reaching out to patient to offer assistance regarding upcoming cardiac imaging study; pt verbalizes understanding of appt date/time, parking situation and where to check in, and verified current allergies; name and call back number provided for further questions should they arise  Gordy Clement RN Navigator Cardiac [place] [facility] [phone] office [phone] cell  Patient has valium for claustrophobia and only reports a partial in her mouth.

## 2022-07-09 NOTE — Progress Notes (Signed)
Chronic Care Management Pharmacy Note  07/09/2022 Name:  Jillian Huynh MRN:  [id number] DOB:  Mar 28, 1946  Summary: addressed HTN, HLD, and primarily DM. Rybelsus and tresiba are obtained via patient assistance.   Hasn't been taking farxiga '10mg'$  daily as she has run out and states the medication is $400.  Since our last visit, she has transitioned from [place] to [facility] CGM. She describes her glucose is over 200 at all times, maybe close to 300s. Taking tresiba at 50 units daily (increased from 42 units).  Wilder Glade & Delene Loll was previously on a grant by patient access network, but has been instructed to check her email daily for when this funding will reopen.   Recommendations/Changes made from today's visit:   - No changes - Provided patient support with: patient access network customer contact number, dexcom customer contact number (she will have to take off her sensor for cardiac MRI and needs a replacement)   Plan: f/u with pharmacist in 1 week to sync glucose sensor with primary care portal, address medication access, and create a plan for obtaining medication  Subjective: Jillian Huynh is an 76 y.o. year old female who is a primary patient of Metheney, Rene Kocher, MD.  The CCM team was consulted for assistance with disease management and care coordination needs.    Engaged with patient by telephone for follow up visit in response to provider referral for pharmacy case management and/or care coordination services.   Consent to Services:  The patient was given information about Chronic Care Management services, agreed to services, and gave verbal consent prior to initiation of services.  Please see initial visit note for detailed documentation.   Patient Care Team: [name], MD as PCP - General (Family Medicine) Sueanne Margarita, MD as PCP - Sleep Medicine (Cardiology) Darius Bump, [facility] as Pharmacist (Pharmacist)   Objective:  Lab Results  Component Value  Date   CREATININE 0.83 05/14/2022   CREATININE 0.66 02/26/2022   CREATININE 0.71 01/01/2022    Lab Results  Component Value Date   HGBA1C 8.1 (A) 05/21/2022   Last diabetic Eye exam:  Lab Results  Component Value Date/Time   HMDIABEYEEXA No Retinopathy 08/23/2021 12:00 AM        Component Value Date/Time   CHOL 110 12/25/2021 1104   TRIG 188 (H) 12/25/2021 1104   HDL 40 (L) 12/25/2021 1104   CHOLHDL 2.8 12/25/2021 1104   VLDL 25 12/11/2021 1244   LDLCALC 44 12/25/2021 1104       Latest Ref Rng & Units 12/25/2021   11:04 AM 10/24/2021    8:51 AM 12/26/2020   11:24 AM  Hepatic Function  Total Protein 6.1 - 8.1 g/dL 6.7  6.5  6.6   AST 10 - 35 U/L '12  9  22   '$ ALT 6 - 29 U/L 11  14  34   Total Bilirubin 0.2 - 1.2 mg/dL 0.9  0.8  1.2   Bilirubin, Direct 0.0 - 0.2 mg/dL 0.1  0.1      Lab Results  Component Value Date/Time   TSH 3.25 04/11/2022 11:39 AM   TSH 3.48 09/18/2021 03:23 PM       Latest Ref Rng & Units 04/11/2022   11:39 AM 09/18/2021    3:23 PM 12/26/2020   11:24 AM  CBC  WBC 3.8 - 10.8 Thousand/uL 7.5  7.9  6.7   Hemoglobin 11.7 - 15.5 g/dL 13.4  13.3  12.7   Hematocrit 35.0 -  45.0 % 39.9  40.1  37.8   Platelets 140 - 400 Thousand/uL 264  275  254     Lab Results  Component Value Date/Time   VD25OH 26 (L) 03/24/2015 09:08 AM    Clinical ASCVD: Yes  The ASCVD Risk score (Arnett DK, et al., 2019) failed to calculate for the following reasons:   The valid total cholesterol range is 130 to 320 mg/dL     Social History   Tobacco Use  Smoking Status Never  Smokeless Tobacco Never   BP Readings from Last 3 Encounters:  06/27/22 (!) 92/58  05/21/22 (!) 99/50  05/14/22 100/60   Pulse Readings from Last 3 Encounters:  06/27/22 67  05/21/22 65  05/14/22 64   Wt Readings from Last 3 Encounters:  06/27/22 187 lb 1.9 oz (84.9 kg)  05/21/22 188 lb (85.3 kg)  05/14/22 184 lb 6.4 oz (83.6 kg)    Assessment: Review of patient past medical  history, allergies, medications, health status, including review of consultants reports, laboratory and other test data, was performed as part of comprehensive evaluation and provision of chronic care management services.   SDOH:  (Social Determinants of Health) assessments and interventions performed:    CCM Care Plan  Allergies  Allergen Reactions   Bydureon [Exenatide] Nausea And Vomiting   Glipizide Other (See Comments)    Leg cramps, fatigue, and weight gain   Trulicity [Dulaglutide] Diarrhea   Invokana [Canagliflozin] Other (See Comments)    Yeast infection   Victoza [Liraglutide] Nausea Only and Other (See Comments)    Constipation,belching,bloating    Medications Reviewed Today     Reviewed by Lelon Perla, MD (Physician) on 06/27/22 at [place] List Status: <None>   Medication Order Taking? Sig Documenting Provider Last Dose Status Informant  Apoaequorin (PREVAGEN PO) [id number] Yes Take 1 tablet by mouth daily. [provider] Taking Active   atorvastatin (LIPITOR) 40 MG tablet [id number] Yes Take 1 tablet (40 mg total) by mouth daily. Lelon Perla, MD Taking Active   B-D ULTRAFINE III SHORT PEN 31G X 8 MM MISC [id number] Yes SUB-Q Daily [name], MD Taking Active   Calcium Carbonate-Vit D-Min (CALCIUM 1200 PO) [id number] Yes Take 1 tablet by mouth in the morning and at bedtime. [provider] Taking Active            Med Note [facility] [place]   Mon Feb 26, 2022 10:31 AM)    Continuous Blood Gluc Sensor ([place]) [place] [id number] Yes Apply every 10 days. [name], MD Taking Active   Continuous Blood Gluc Sensor (FREESTYLE LIBRE 3 SENSOR) [place] [id number] Yes 1 each by Does not apply route every 14 (fourteen) days. Sensors to be changed every 14 days. DX E11.8 [name], MD Taking Active   ezetimibe (ZETIA) 10 MG tablet [id number] Yes Take 1 tablet (10 mg total) by mouth daily. Lelon Perla, MD Taking  Active            Med Note [facility] [place]   Mon Feb 26, 2022 10:30 AM)    Insulin Degludec [facility]) [id number] Yes Inject 42 Units into the skin daily. [provider] Taking Active   metFORMIN (GLUCOPHAGE) 500 MG tablet [id number] Yes Take 1 tablet (500 mg total) by mouth 2 (two) times daily with a meal. [name], MD Taking Active   metoprolol succinate (TOPROL XL) 50 MG 24 hr tablet [id number] Yes Take 1 tablet (  50 mg total) by mouth daily. Larey Dresser, MD Taking Active   Omega-3 Fatty Acids (FISH OIL) 1200 MG CAPS [id number] Yes Take 1 capsule by mouth daily. [provider] Taking Active   [facility] VERIO test strip [id number] Yes DX DM E11.65. CHECK FASTING BLOOD SUGAR EVERY MORNING AND 2 HOURS AFTER LARGE MEALS DAILY. CHECK 3 TIMES DAILY [name], MD Taking Active   Probiotic Product (PROBIOTIC PO) [id number] Yes Take by mouth. [provider] Taking Active   rOPINIRole (REQUIP) 4 MG tablet [id number] Yes Take 1 tablet (4 mg total) by mouth daily. [name], MD Taking Active   sacubitril-valsartan [facility]) 97-103 MG [id number] Yes Take 1 tablet by mouth 2 (two) times daily. Larey Dresser, MD Taking Active   Semaglutide [facility]) 14 MG TABS [id number] Yes Take 1 tablet by mouth daily. [provider] Taking Active   spironolactone (ALDACTONE) 50 MG tablet [id number] Yes Take 1 tablet (50 mg total) by mouth daily. Larey Dresser, MD Taking Active             Patient Active Problem List   Diagnosis Date Noted   Systolic CHF [facility]) 63/84/5364   Cardiomyopathy ([place]) 05/21/2022   Left bundle branch block (LBBB) determined by electrocardiography 08/08/2021   Gastroesophageal reflux disease without esophagitis 09/19/2020   Severe obesity (BMI 35.0-39.9) with comorbidity ([place]) 05/25/2019   Fatty liver 12/25/2017   BP (high blood pressure) 03/28/2015   Dyslipidemia 03/28/2015   Insomnia 03/28/2015    Restless leg 03/28/2015   Mass of arm 03/14/2015   Diabetes mellitus type 2, controlled ([place]) 03/14/2015   Obstructive apnea 09/03/2012   HYPERKERATOSIS 04/01/2009    Immunization History  Administered Date(s) Administered   Moderna Sars-Covid-2 Vaccination 01/06/2020, 02/12/2020   Pneumococcal Conjugate-13 12/25/2017   Tdap 03/26/2007, 06/29/2015    Conditions to be addressed/monitored: HTN, HLD, and DMII  There are no care plans that you recently modified to display for this patient.     Medication Assistance:  Rybelsus & tresiba obtained through [place] medication assistance program.  Enrollment ends TBD.  Also provided application for entresto, pending status update.  Patient's preferred pharmacy is:  [place], [place] - [place] [address] [place] [address] Phone: [phone] Fax: [phone]   Uses pill box? Yes Pt endorses 100% compliance  Follow Up:  Patient agrees to Care Plan and Follow-up.  Plan: Face to Face appointment with care management team member scheduled for: 1 week  Larinda Buttery, PharmD Clinical Pharmacist [facility] [phone]

## 2022-07-09 NOTE — Patient Instructions (Signed)
Visit Information  Thank you for taking time to visit with me today. Please don't hesitate to contact me if I can be of assistance to you before our next scheduled telephone appointment.  Following are the goals we discussed today:   Patient Goals/Self-Care Activities Over the next 7 days, patient will:  Take medications as prescribed and utilize CGM, for data to be discussed at future appointments, check BP and write down for future discussion Follow Up Plan: Face to face follow up appointment with care management team member scheduled for: 1 week  Please call the care guide team at [phone] if you need to cancel or reschedule your appointment.    Patient verbalizes understanding of instructions and care plan provided today and agrees to view in [place]. Active MyChart status and patient understanding of how to access instructions and care plan via MyChart confirmed with patient.     Jillian Huynh

## 2022-07-10 ENCOUNTER — Other Ambulatory Visit: Payer: Self-pay | Admitting: Cardiology

## 2022-07-10 ENCOUNTER — Ambulatory Visit (HOSPITAL_COMMUNITY)
Admission: RE | Admit: 2022-07-10 | Discharge: 2022-07-10 | Disposition: A | Discharge status: Home / Self Care | Payer: Medicare Other | Source: Ambulatory Visit | Attending: Cardiology | Admitting: Cardiology

## 2022-07-10 DIAGNOSIS — I429 Cardiomyopathy, unspecified: Secondary | ICD-10-CM

## 2022-07-10 MED ORDER — GADOBUTROL 1 MMOL/ML IV SOLN
10.0000 mL | Freq: Once | INTRAVENOUS | Status: AC | PRN
Start: 2022-07-10 — End: 2022-07-10
  Administered 2022-07-10: 10 mL via INTRAVENOUS

## 2022-07-11 ENCOUNTER — Ambulatory Visit: Payer: Medicare Other | Attending: Cardiology | Admitting: Cardiology

## 2022-07-11 ENCOUNTER — Encounter: Payer: Self-pay | Admitting: Cardiology

## 2022-07-11 VITALS — BP 98/62 | HR 66 | Ht 62.0 in | Wt 191.4 lb

## 2022-07-11 DIAGNOSIS — G4733 Obstructive sleep apnea (adult) (pediatric): Secondary | ICD-10-CM

## 2022-07-11 DIAGNOSIS — I10 Essential (primary) hypertension: Secondary | ICD-10-CM | POA: Diagnosis not present

## 2022-07-11 NOTE — Patient Instructions (Signed)
Medication Instructions:  Your physician recommends that you continue on your current medications as directed. Please refer to the Current Medication list given to you today.  *If you need a refill on your cardiac medications before your next appointment, please call your pharmacy*  Testing/Procedures: BiPAP titration - our sleep team will be in touch with you to schedule.    Follow-Up: At [facility], you and your health needs are our priority.  As part of our continuing mission to provide you with exceptional heart care, we have created designated Provider Care Teams.  These Care Teams include your primary Cardiologist (physician) and Advanced Practice Providers (APPs -  Physician Assistants and Nurse Practitioners) who all work together to provide you with the care you need, when you need it.  Follow up with Dr. Radford Pax based on BiPAP titration results  Important Information About [place]

## 2022-07-11 NOTE — Progress Notes (Signed)
Sleep Medicine CONSULT Note    Date:  07/11/2022   ID:  Jillian Huynh, DOB March 11, 1946, MRN [id number]  PCP:  [name], MD  Cardiologist: Kirk Ruths, MD  Advanced heart failure Cardiologist: Dr. Aundra Dubin, MD  No chief complaint on file.   History of Present Illness:  Jillian Huynh is a 76 y.o. female who is being seen today for the evaluation of obstructive sleep apnea at the request of Dr. Aundra Dubin MD.  This is a 76 year old female with a history of diabetes mellitus, hyperlipidemia, hypertension and chronic systolic CHF with dilated cardiomyopathy.  When seeing Dr. Aundra Dubin at a visit back in January she complained of excessive daytime sleepiness with easy fatigability.  Due to her chronic systolic CHF a home sleep study was ordered.   This demonstrated moderate obstructive sleep apnea with an AHI of 17.7/h and O2 saturations as low as 62%.  She did not meet criteria for nocturnal hypoxemia.  She was placed on auto CPAP from 5 to 15 cm H2O and is now here for follow-up.  She is doing well with her PAP device and thinks that sher has gotten used to it.  She tolerates the mask.  She says that at times she feels the pressure is not enough.  Since going on PAP she feels rested in the am and has no significant daytime sleepiness on some days but not all days.  She denies any significant mouth or nasal dryness or nasal congestion.  She does not think that he snores.    Past Medical History:  Diagnosis Date   Diabetes mellitus without complication ([place])    Hyperlipidemia    Hypertension     Past Surgical History:  Procedure Laterality Date   CHOLECYSTECTOMY      Current Medications: Current Meds  Medication Sig   atorvastatin (LIPITOR) 40 MG tablet Take 1 tablet (40 mg total) by mouth daily.   B-D ULTRAFINE III SHORT PEN 31G X 8 MM MISC SUB-Q Daily   Calcium Carbonate-Vit D-Min (CALCIUM 1200 PO) Take 1 tablet by mouth in the morning and at bedtime.   Continuous Blood Gluc  Sensor (DEXCOM G7 SENSOR) MISC Apply every 10 days.   Continuous Blood Gluc Sensor (FREESTYLE LIBRE 3 SENSOR) MISC 1 each by Does not apply route every 14 (fourteen) days. Sensors to be changed every 14 days. DX E11.8   ezetimibe (ZETIA) 10 MG tablet Take 1 tablet (10 mg total) by mouth daily.   Insulin Degludec (TRESIBA ) Inject 50 Units into the skin daily.   metFORMIN (GLUCOPHAGE) 500 MG tablet Take 1 tablet (500 mg total) by mouth 2 (two) times daily with a meal.   metoprolol succinate (TOPROL XL) 50 MG 24 hr tablet Take 1 tablet (50 mg total) by mouth daily.   Omega-3 Fatty Acids (FISH OIL) 1200 MG CAPS Take 1 capsule by mouth daily.   ONETOUCH VERIO test strip DX DM E11.65. CHECK FASTING BLOOD SUGAR EVERY MORNING AND 2 HOURS AFTER LARGE MEALS DAILY. CHECK 3 TIMES DAILY   Probiotic Product (PROBIOTIC PO) Take by mouth.   rOPINIRole (REQUIP) 4 MG tablet Take 1 tablet (4 mg total) by mouth daily.   sacubitril-valsartan (ENTRESTO) 97-103 MG Take 1 tablet by mouth 2 (two) times daily.   Semaglutide (RYBELSUS) 14 MG TABS Take 1 tablet by mouth daily.   spironolactone (ALDACTONE) 50 MG tablet Take 1 tablet (50 mg total) by mouth daily.    Allergies:   Bydureon [exenatide], Glipizide,  Trulicity [dulaglutide], Invokana [canagliflozin], and Victoza [liraglutide]   Social History   Socioeconomic History   Marital status: Married    Spouse name: Biochemist, clinical   Number of children: 3   Years of education: 16.5   Highest education level: Some college, no degree  Occupational History   Occupation: Hair Stylist  Tobacco Use   Smoking status: Never   Smokeless tobacco: Never  Substance and Sexual Activity   Alcohol use: No    Alcohol/week: 0.0 standard drinks of alcohol   Drug use: No   Sexual activity: Yes    Partners: Male  Other Topics Concern   Not on file  Social History Narrative   Lives with her husband. She is working full time as a Probation officer. Two of her children live close  by. One is in [place]. One of her daughter's is deceased. Her hobbies include watch tv, listening to music and reading.    Social Determinants of Health   Financial Resource Strain: Low Risk  (07/22/2021)   Overall Financial Resource Strain (CARDIA)    Difficulty of Paying Living Expenses: Not hard at all  Food Insecurity: No Food Insecurity (07/22/2021)   Hunger Vital Sign    Worried About Running Out of Food in the Last Year: Never true    Ran Out of Food in the Last Year: Never true  Transportation Needs: No Transportation Needs (07/22/2021)   PRAPARE - Hydrologist (Medical): No    Lack of Transportation (Non-Medical): No  Physical Activity: Inactive (07/22/2021)   Exercise Vital Sign    Days of Exercise per Week: 0 days    Minutes of Exercise per Session: 0 min  Stress: No Stress Concern Present (07/22/2021)   [place]    Feeling of Stress : Not at all  Social Connections: Moderately Integrated (07/22/2021)   Social Connection and Isolation Panel [NHANES]    Frequency of Communication with Friends and Family: More than three times a week    Frequency of Social Gatherings with Friends and Family: More than three times a week    Attends Religious Services: Never    Marine scientist or Organizations: Yes    Attends Music therapist: 1 to 4 times per year    Marital Status: Married     Family History:  The patient's family history includes Cancer (age of onset: 95) in her father; Colon cancer (age of onset: 58) in her daughter; Diabetes in her father; Glaucoma in her maternal aunt, mother, sister, and sister; Hepatitis C (age of onset: 9) in her mother; Hypertension in an other family member; Thyroid disease in an other family member.   ROS:   Please see the history of present illness.    ROS All other systems reviewed and are negative.      No data to display              PHYSICAL EXAM:   VS:  BP 98/62   Pulse 66   Ht '5\' 2"'$  (1.575 m)   Wt 191 lb 6.4 oz (86.8 kg)   SpO2 94%   BMI 35.01 kg/m    GEN: Well nourished, well developed, in no acute distress  HEENT: normal  Neck: no JVD, carotid bruits, or masses Cardiac: RRR; no murmurs, rubs, or gallops,no edema.  Intact distal pulses bilaterally.  Respiratory:  clear to auscultation bilaterally, normal work of breathing GI: soft, nontender,  nondistended, + BS MS: no deformity or atrophy  Skin: warm and dry, no rash Neuro:  Alert and Oriented x 3, Strength and sensation are intact Psych: euthymic mood, full affect  Wt Readings from Last 3 Encounters:  07/11/22 191 lb 6.4 oz (86.8 kg)  06/27/22 187 lb 1.9 oz (84.9 kg)  05/21/22 188 lb (85.3 kg)      Studies/Labs Reviewed:   Home sleep study and PAP compliance download  Recent Labs: 12/25/2021: ALT 11 04/11/2022: Hemoglobin 13.4; Platelets 264; TSH 3.25 05/14/2022: B Natriuretic Peptide 38.7; BUN 26; Creatinine, Ser 0.83; Potassium 4.3; Sodium 137    CHA2DS2-VASc Score =     This indicates a  % annual risk of stroke. The patient's score is based upon:             Additional studies/ records that were reviewed today include:  None    ASSESSMENT:    1. OSA (obstructive sleep apnea)   2. Primary hypertension      PLAN:  In order of problems listed above:  OSA - The patient is tolerating PAP therapy well without any problems. The PAP download performed by his DME was personally reviewed and interpreted by me today and showed an AHI of 17.6 /hr on auto CPAP from 4-15 cm H2O with 70% compliance in using more than 4 hours nightly.  The patient has been using and benefiting from PAP use and will continue to benefit from therapy.  -Her OSA is not adequately treated on auto CPAP so I am going to send her to the sleep lab for a BiPAP titration  2.  Hypertension -BP is well controlled on exam today -Continue prescription drug  management with Toprol-XL 50 mg daily and Entresto 97-103 mg twice daily as well as spironolactone 50 mg daily with as needed refills  Time Spent: 20 minutes total time of encounter, including 15 minutes spent in face-to-face patient care on the date of this encounter. This time includes coordination of care and counseling regarding above mentioned problem list. Remainder of non-face-to-face time involved reviewing chart documents/testing relevant to the patient encounter and documentation in the medical record. I have independently reviewed documentation from referring provider  Medication Adjustments/Labs and Tests Ordered: Current medicines are reviewed at length with the patient today.  Concerns regarding medicines are outlined above.  Medication changes, Labs and Tests ordered today are listed in the Patient Instructions below.  There are no Patient Instructions on file for this visit.   Signed, Fransico Him, MD  07/11/2022 11:42 AM    [facility] [place], [place], [place]  [address] Phone: [phone]; Fax: [phone]

## 2022-07-11 NOTE — Addendum Note (Signed)
Addended by: Antonieta Iba on: 07/11/2022 11:49 AM   Modules accepted: Orders

## 2022-07-12 DIAGNOSIS — E118 Type 2 diabetes mellitus with unspecified complications: Secondary | ICD-10-CM

## 2022-07-12 DIAGNOSIS — I10 Essential (primary) hypertension: Secondary | ICD-10-CM

## 2022-07-12 DIAGNOSIS — E785 Hyperlipidemia, unspecified: Secondary | ICD-10-CM

## 2022-07-12 DIAGNOSIS — E1169 Type 2 diabetes mellitus with other specified complication: Secondary | ICD-10-CM

## 2022-07-13 ENCOUNTER — Telehealth: Payer: Self-pay | Admitting: Cardiology

## 2022-07-13 NOTE — Telephone Encounter (Signed)
Pt returning a call for results

## 2022-07-13 NOTE — Telephone Encounter (Signed)
Per Chart review- patient is aware of results.   Jillian Huynh  07/13/2022  1:58 PM EDT     S/w pt is aware of MRI results.

## 2022-07-17 NOTE — Telephone Encounter (Signed)
Spoke with pt, questions regarding MRI answered.

## 2022-07-18 ENCOUNTER — Ambulatory Visit (INDEPENDENT_AMBULATORY_CARE_PROVIDER_SITE_OTHER): Payer: Medicare Other | Admitting: Pharmacist

## 2022-07-18 ENCOUNTER — Other Ambulatory Visit: Payer: Self-pay

## 2022-07-18 VITALS — BP 108/73 | HR 66

## 2022-07-18 DIAGNOSIS — E118 Type 2 diabetes mellitus with unspecified complications: Secondary | ICD-10-CM

## 2022-07-18 DIAGNOSIS — E1169 Type 2 diabetes mellitus with other specified complication: Secondary | ICD-10-CM

## 2022-07-18 DIAGNOSIS — I10 Essential (primary) hypertension: Secondary | ICD-10-CM

## 2022-07-18 MED ORDER — ROPINIROLE HCL 4 MG PO TABS: 4.0000 mg | ORAL_TABLET | Freq: Every day | ORAL | 0 refills | Status: DC

## 2022-07-18 NOTE — Progress Notes (Signed)
Chronic Care Management Pharmacy Note  07/18/2022 Name:  Jillian Huynh MRN:  [id number] DOB:  [birth year]  Summary: addressed HTN, HLD, and primarily DM. Rybelsus and tresiba are obtained via patient assistance.   Hasn't been taking farxiga '10mg'$  daily as she has run out and states the medication is $400.  Since our last visit, she has transitioned from [place] to [facility] CGM. She describes her glucose is over 200 at all times, maybe close to 300s. Taking tresiba at 50 units daily (increased from 42 units).  [employer]ilder [employer]ade [employer] Delene Loll was previously on a grant by patient access network, but has been instructed to check her email daily for when this funding will reopen.  Recommendations/Changes made from today's visit:   - No changes to medication from this visit, provided education, care coordination, and set up patient with the following: -Dexcom clarity provider sharing (to discuss glucose at future visit) - Wilder [employer] Delene Loll patient assistance paperwork   Plan: f/u with pharmacist in 3-4 weeks  Subjective: Jillian Huynh is an 76 y.o. year old female who is a primary patient of Metheney, Rene Kocher, MD.  The CCM team was consulted for assistance with disease management and care coordination needs.    Engaged with patient by telephone for follow up visit in response to provider referral for pharmacy case management and/or care coordination services.   Consent to Services:  The patient was given information about Chronic Care Management services, agreed to services, and gave verbal consent prior to initiation of services.  Please see initial visit note for detailed documentation.   Patient Care Team: [name], MD as PCP - General (Family Medicine) Sueanne Margarita, MD as PCP - Sleep Medicine (Cardiology) Darius Bump, [facility] as Pharmacist (Pharmacist)   Objective:  Lab Results  Component Value Date   CREATININE 0.83 05/14/2022   CREATININE 0.66 02/26/2022   CREATININE 0.71  01/01/2022    Lab Results  Component Value Date   HGBA1C 8.1 (A) 05/21/2022   Last diabetic Eye exam:  Lab Results  Component Value Date/Time   HMDIABEYEEXA No Retinopathy 08/23/2021 12:00 AM        Component Value Date/Time   CHOL 110 12/25/2021 1104   TRIG 188 (H) 12/25/2021 1104   HDL 40 (L) 12/25/2021 1104   CHOLHDL 2.8 12/25/2021 1104   VLDL 25 12/11/2021 1244   LDLCALC 44 12/25/2021 1104       Latest Ref Rng & Units 12/25/2021   11:04 AM 10/24/2021    8:51 AM 12/26/2020   11:24 AM  Hepatic Function  Total Protein 6.1 - 8.1 g/dL 6.7  6.5  6.6   AST 10 - 35 U/L '12  9  22   '$ ALT 6 - 29 U/L 11  14  34   Total Bilirubin 0.2 - 1.2 mg/dL 0.9  0.8  1.2   Bilirubin, Direct 0.0 - 0.2 mg/dL 0.1  0.1      Lab Results  Component Value Date/Time   TSH 3.25 04/11/2022 11:39 AM   TSH 3.48 09/18/2021 03:23 PM       Latest Ref Rng & Units 04/11/2022   11:39 AM 09/18/2021    3:23 PM 12/26/2020   11:24 AM  CBC  WBC 3.8 - 10.8 Thousand/uL 7.5  7.9  6.7   Hemoglobin 11.7 - 15.5 g/dL 13.4  13.3  12.7   Hematocrit 35.0 - 45.0 % 39.9  40.1  37.8   Platelets 140 - 400 Thousand/uL 264  275  254     Lab Results  Component Value Date/Time   VD25OH 26 (L) 03/24/2015 09:08 AM    Clinical ASCVD: Yes  The ASCVD Risk score (Arnett DK, et al., 2019) failed to calculate for the following reasons:   The valid total cholesterol range is 130 to 320 mg/dL     Social History   Tobacco Use  Smoking Status Never  Smokeless Tobacco Never   BP Readings from Last 3 Encounters:  07/18/22 108/73  07/11/22 98/62  06/27/22 (!) 92/58   Pulse Readings from Last 3 Encounters:  07/18/22 66  07/11/22 66  06/27/22 67   Wt Readings from Last 3 Encounters:  07/11/22 191 lb 6.4 oz (86.8 kg)  06/27/22 187 lb 1.9 oz (84.9 kg)  05/21/22 188 lb (85.3 kg)    Assessment: Review of patient past medical history, allergies, medications, health status, including review of consultants reports,  laboratory and other test data, was performed as part of comprehensive evaluation and provision of chronic care management services.   SDOH:  (Social Determinants of Health) assessments and interventions performed:  SDOH Interventions    Flowsheet Row Office Visit from 05/21/2022 in [place] Office Visit from 05/08/2021 in [place] Office Visit from 01/30/2021 in [place] Office Visit from 12/09/2019 in [place] Interventions -- -- Intervention Not Indicated --  Housing Interventions -- -- Intervention Not Indicated --  Transportation Interventions -- -- Intervention Not Indicated --  Depression Interventions/Treatment  PHQ2-9 Score <4 Follow-up Not Indicated Currently on Treatment -- Counseling  Financial Strain Interventions -- -- Intervention Not Indicated --  Physical Activity Interventions -- -- Intervention Not Indicated --  Stress Interventions -- -- Intervention Not Indicated --  Social Connections Interventions -- -- Intervention Not Indicated --       CCM Care Plan  Allergies  Allergen Reactions   Bydureon [Exenatide] Nausea And Vomiting   Glipizide Other (See Comments)    Leg cramps, fatigue, and weight gain   Trulicity [Dulaglutide] Diarrhea   Invokana [Canagliflozin] Other (See Comments)    Yeast infection   Victoza [Liraglutide] Nausea Only and Other (See Comments)    Constipation,belching,bloating    Medications Reviewed Today     Reviewed by Darius Bump, [facility] (Pharmacist) on 07/18/22 at 1028  Med List Status: <None>   Medication Order Taking? Sig Documenting Provider Last Dose Status Informant  atorvastatin (LIPITOR) 40 MG tablet [id number] Yes Take 1 tablet (40 mg total) by mouth daily. Lelon Perla, MD Taking Active   B-D ULTRAFINE III SHORT PEN 31G X 8 MM MISC  [id number] Yes SUB-Q Daily [name], MD Taking Active   Calcium Carbonate-Vit D-Min (CALCIUM 1200 PO) [id number] Yes Take 1 tablet by mouth in the morning and at bedtime. [provider] Taking Active            Med Note [facility] [place]   Mon Feb 26, 2022 10:31 AM)    Continuous Blood Gluc Sensor ([place]) [place] [id number] Yes Apply every 10 days. [name], MD Taking Active   ezetimibe (ZETIA) 10 MG tablet [id number] Yes Take 1 tablet (10 mg total) by mouth daily. Lelon Perla, MD Taking Active            Med Note Gilman Buttner, Juanita Laster  Mon Feb 26, 2022 10:30 AM)    Insulin Degludec [facility]) [id number] Yes Inject 50 Units into the skin daily. [provider] Taking Active   metFORMIN (GLUCOPHAGE) 500 MG tablet [id number] Yes Take 1 tablet (500 mg total) by mouth 2 (two) times daily with a meal. [name], MD Taking Active   metoprolol succinate (TOPROL XL) 50 MG 24 hr tablet [id number] Yes Take 1 tablet (50 mg total) by mouth daily. Larey Dresser, MD Taking Active   Omega-3 Fatty Acids (FISH OIL) 1200 MG CAPS [id number] Yes Take 1 capsule by mouth daily. [provider] Taking Active   omeprazole (PRILOSEC) 20 MG capsule [id number] Yes Take 20 mg by mouth daily. [provider] Taking Active   [facility] VERIO test strip [id number] Yes DX DM E11.65. CHECK FASTING BLOOD SUGAR EVERY MORNING AND 2 HOURS AFTER LARGE MEALS DAILY. CHECK 3 TIMES DAILY [name], MD Taking Active   Probiotic Product (PROBIOTIC PO) [id number] Yes Take by mouth. [provider] Taking Active   rOPINIRole (REQUIP) 4 MG tablet [id number] Yes Take 1 tablet (4 mg total) by mouth daily. [name], MD Taking Active   sacubitril-valsartan [facility]) 97-103 MG [id number] Yes Take 1 tablet by mouth 2 (two) times daily. Larey Dresser, MD Taking Active   Semaglutide [facility]) 14 MG TABS [id number] Yes Take 1 tablet by  mouth daily. [provider] Taking Active   spironolactone (ALDACTONE) 50 MG tablet [id number] Yes Take 1 tablet (50 mg total) by mouth daily. Larey Dresser, MD Taking Active             Patient Active Problem List   Diagnosis Date Noted   Systolic CHF [facility]) 27/78/2423   Cardiomyopathy ([place]) 05/21/2022   Left bundle branch block (LBBB) determined by electrocardiography 08/08/2021   Gastroesophageal reflux disease without esophagitis 09/19/2020   Severe obesity (BMI 35.0-39.9) with comorbidity ([place]) 05/25/2019   Fatty liver 12/25/2017   BP (high blood pressure) 03/28/2015   Dyslipidemia 03/28/2015   Insomnia 03/28/2015   Restless leg 03/28/2015   Mass of arm 03/14/2015   Diabetes mellitus type 2, controlled ([place]) 03/14/2015   Obstructive apnea 09/03/2012   HYPERKERATOSIS 04/01/2009    Immunization History  Administered Date(s) Administered   Moderna Sars-Covid-2 Vaccination 01/06/2020, 02/12/2020   Pneumococcal Conjugate-13 12/25/2017   Tdap 03/26/2007, 06/29/2015    Conditions to be addressed/monitored: HTN, HLD, and DMII  Care Plan : Medication Management  Updates made by Darius Bump, [place] since 07/18/2022 12:00 AM     Problem: HTN, DM, HLD   Priority: High     Long-Range Goal: Disease Progression Prevention   Recent Progress: On track  Priority: High  Note:   Current Barriers:  Unable to achieve control of diabetes   Pharmacist Clinical Goal(s):  Over the next 60 days, patient will achieve control of diabetes as evidenced by blood glucose values and A1c through collaboration with PharmD and provider.   Interventions: 1:1 collaboration with [name], MD regarding development and update of comprehensive plan of care as evidenced by provider attestation and co-signature Inter-disciplinary care team collaboration (see longitudinal plan of care) Comprehensive medication review performed; medication list updated in electronic medical  record  Diabetes:  Uncontrolled; tresiba 50 units daily, farxiga '10mg'$  daily (unable to obtain), rybelsus '14mg'$  daily  Current glucose readings: via CGM - switched to Dexcom  Date of Download: 04/11/22 % Time CGM is active: 92% Average Glucose: 187 mg/dL Glucose Management  Indicator: 7.8%  Glucose Variability: 16.4% (goal <36%) Time in Goal:  - Time in range 70-180: 49% - Time above range: 51% - Time below range: 0% Observed patterns:dinner still the time of highest readings, one hypoglycemic reading may 22, with exercise  Date of Download: 12/27/21 % Time CGM is active: 97% Average Glucose: 197 mg/dL Glucose Management Indicator: 8%  Glucose Variability: 21.4% (goal <36%) Time in Goal:  - Time in range 70-180: 45% - Time above range: 55% - Time below range: 0% Observed patterns: dinner still the time of highest readings  Date of Download: 11/08/21 % Time CGM is active: 91% Average Glucose: 194 mg/dL Glucose Management Indicator: 8.0%  Glucose Variability: 19.2% (goal <36%) Time in Goal:  - Time in range 70-180: 41% - Time above range: 59% - Time below range: 0% Observed patterns: 6-9pm still highest glucose range  Date of Download: 10/09/21 % Time CGM is active: 78% Average Glucose: 192 mg/dL Glucose Management Indicator: 7.9%  Glucose Variability: 21.1% (goal <36%) Time in Goal:  - Time in range 70-180: 46% - Time above range: 54% - Time below range: 0% Observed patterns:erratic, eating habits have changed over holiday season, which is to be expected.  Date of Download: 09/25/21 % Time CGM is active: 82% Average Glucose: 166 mg/dL Glucose Management Indicator: 7.3%  Glucose Variability: 16.1 (goal <36%) Time in Goal:  - Time in range 70-180: 78% - Time above range: 22% - Time below range: 0% Observed patterns: Still highest range 7-9pm  Date of Download: 09/04/21 % Time CGM is active: 81% Average Glucose: 166 mg/dL Glucose Management Indicator: 7.3%   Glucose Variability: 16.6% (goal <36%) Time in Goal:  - Time in range 70-180: 73% - Time above range: 27% - Time below range: 0% Observed patterns: BG still highest 8-9pm, but still shows improvement  Date of Download: 07/24/21 % Time CGM is active: 85% Average Glucose: 173 mg/dL Glucose Management Indicator: 7.4  Glucose Variability: 16.9 (goal <36%) Time in Goal:  - Time in range 70-180: 65% - Time above range: 35% - Time below range: 0% Observed patterns: still with elevations 8-10pm, after patient gets off of a long day at work she states she eats late. We are working on this.  Date of Download: 07/12/21 % Time CGM is active: 84% Average Glucose: 197 mg/dL Glucose Management Indicator: 8.0%  Glucose Variability: 24.6 (goal <36%) Time in Goal:  - Time in range 70-180: 45% - Time above range: 55% - Time below range: 0% Observed patterns: Still with highest spikes ~9pm, glucose hits 270-300s.   Denies hyper/hypoglycemic symptoms  Previously discussed current meal patterns:  breakfast: oatmeal, eggs, "stays away from white bread";  lunch: ham & cheese 1/2 sandwhich (w/whole wheat), salad, apple;  dinner: pasta, potatoes, attempts meat/vegetables as much as possible;  snacks: crackers, cheese, sneaks cookie, husband brings sweets into house & patient struggles, describes self as "sugar-holic" ;   Making progress toward goals. Patient also expressed cost constraint with tresiba & farxiga. Patient IS eligible for tresiba & rybelsus patient assistance, but not farxiga.    Hypertension:  Controlled; entresto 97-'103mg'$  BID, metoprolol XL '25mg'$  daily   Current home readings: not currently checking  Denies hypotensive/hypertensive symptoms  Recommended continue current regimen  Hyperlipidemia:  Controlled; atorvastatin '40mg'$  + ezetemibe '10mg'$  daily;   Recommended continue current regimen  Patient Goals/Self-Care Activities Over the next 30 days, patient will:  Take medications  as prescribed and utilize CGM, for data to be discussed at  future appointments, check BP and write down for future discussion  Follow Up Plan: Telephone follow up appointment with care management team member scheduled for: 3-4weeks       Medication Assistance:  Rybelsus & tresiba obtained through [place] medication assistance program.  Enrollment ends TBD.  Also provided application for entresto, pending status update.  Patient's preferred pharmacy is:  [place], [place] - [place] [address] [place] [address] Phone: [phone] Fax: [phone]   Uses pill box? Yes Pt endorses 100% compliance  Follow Up:  Patient agrees to Care Plan and Follow-up.  Plan: Telephone follow up appointment with care management team member scheduled for:  3-4 weeks  Larinda Buttery, PharmD Clinical Pharmacist [facility] [phone]

## 2022-07-18 NOTE — Patient Instructions (Signed)
Visit Information  Thank you for taking time to visit with me today. Please don't hesitate to contact me if I can be of assistance to you before our next scheduled telephone appointment.  Following are the goals we discussed today:  Patient Goals/Self-Care Activities Over the next 30 days, patient will:  Take medications as prescribed and utilize CGM, for data to be discussed at future appointments, check BP and write down for future discussion  Follow Up Plan: Telephone follow up appointment with care management team member scheduled for: 3-4weeks  Please call the care guide team at [phone] if you need to cancel or reschedule your appointment.    Patient verbalizes understanding of instructions and care plan provided today and agrees to view in [place]. Active MyChart status and patient understanding of how to access instructions and care plan via MyChart confirmed with patient.     Jillian Huynh

## 2022-08-10 ENCOUNTER — Telehealth: Payer: Self-pay | Admitting: Family Medicine

## 2022-08-10 NOTE — Telephone Encounter (Signed)
Received 4 boxes of Tresiba Flextouch on 08/10/2022 at 10:30 am. I will call patient for pick up and place in fridge. - CF

## 2022-08-11 DIAGNOSIS — Z794 Long term (current) use of insulin: Secondary | ICD-10-CM | POA: Diagnosis not present

## 2022-08-11 DIAGNOSIS — E1169 Type 2 diabetes mellitus with other specified complication: Secondary | ICD-10-CM | POA: Diagnosis not present

## 2022-08-11 DIAGNOSIS — E785 Hyperlipidemia, unspecified: Secondary | ICD-10-CM

## 2022-08-11 DIAGNOSIS — I10 Essential (primary) hypertension: Secondary | ICD-10-CM | POA: Diagnosis not present

## 2022-08-13 NOTE — Progress Notes (Signed)
PCP: [name], MD Cardiology: Dr. Stanford Breed HF Cardiology: Dr. Aundra Dubin  76 y.o. with history of LBBB, HTN, and nonischemic cardiomyopathy was referred by Dr. Stanford Breed for evaluation of CHF.  Patient reports exertional dyspnea now for a couple of years.  She was evaluated by her PCP and referred to Dr. Stanford Breed in 11/22 for dyspnea and a LBBB noted by ECG. Echo in 11/22 showed EF in the 30% range by my review with normal RV, mild AI, mild MR, and IVC normal.  Coronary CTA to follow this showed mild nonobstructive CAD.  She has no history of ETOH or drugs.  She has no family history of cardiomyopathy that she knows of.  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    Repeat echo was done in 6/23, EF read as 50-55%, looks no more than about 40% to me on my review. She was referred to Dr. Lovena Le for possible CRT-D.   Patient returns for followup of CHF.  She has lost about 11 lbs, now getting semaglutide.  She is short of breath walking around a large store, but generally does fine walking short distances.  She is now on CPAP, feels like she is less tired.  No chest pain.  No orthopnea/PND.   Labs (12/22): K 4.3, creatinine 0.67, LDL 71, TGs 226 Labs (2/23): K 4.2, creatinine 0.71 Labs (4/23): K 4.1, creatinine 0.66, BNP 111  ECG (personally reviewed): NSR, LBBB 146 msec  PMH: 1. HTN 2. Hyperlipidemia 3. OSA: CPAP.  4. Chronic systolic CHF: Nonischemic cardiomyopathy.  - Echo (11/22): On my review, EF 30%, normal RV size and systolic function, mild MR, mild AI, IVC normal.  - No h/o ETOH.  - Coronary CTA: 162 Agatston units (72nd percentile), minimal LAD plaque.  - Echo (6/23): EF read as 50-55%, looks no more than about 40% to me on my review.  5. Chronic LBBB 6. ABIs (10/22): Normal.   Social History   Socioeconomic History   Marital status: Married    Spouse name: Biochemist, clinical   Number of children: 3   Years of education: 16.5   Highest education level: Some  college, no degree  Occupational History   Occupation: Hair Stylist  Tobacco Use   Smoking status: Never   Smokeless tobacco: Never  Substance and Sexual Activity   Alcohol use: No    Alcohol/week: 0.0 standard drinks of alcohol   Drug use: No   Sexual activity: Yes    Partners: Male  Other Topics Concern   Not on file  Social History Narrative   Lives with her husband. She is working full time as a Probation officer. Two of her children live close by. One is in [place]. One of her daughter's is deceased. Her hobbies include watch tv, listening to music and reading.    Social Determinants of Health   Financial Resource Strain: Low Risk  (07/22/2021)   Overall Financial Resource Strain (CARDIA)    Difficulty of Paying Living Expenses: Not hard at all  Food Insecurity: No Food Insecurity (07/22/2021)   Hunger Vital Sign    Worried About Running Out of Food in the Last Year: Never true    Ran Out of Food in the Last Year: Never true  Transportation Needs: No Transportation Needs (07/22/2021)   PRAPARE - Hydrologist (Medical): No    Lack of Transportation (Non-Medical): No  Physical Activity: Inactive (07/22/2021)   Exercise Vital Sign    Days  of Exercise per Week: 0 days    Minutes of Exercise per Session: 0 min  Stress: No Stress Concern Present (07/22/2021)   [place]    Feeling of Stress : Not at all  Social Connections: Moderately Integrated (07/22/2021)   Social Connection and Isolation Panel [NHANES]    Frequency of Communication with Friends and Family: More than three times a week    Frequency of Social Gatherings with Friends and Family: More than three times a week    Attends Religious Services: Never    Marine scientist or Organizations: Yes    Attends Archivist Meetings: 1 to 4 times per year    Marital Status: Married  Human resources officer Violence: Not At Risk  (07/22/2021)   Humiliation, Afraid, Rape, and Kick questionnaire    Fear of Current or Ex-Partner: No    Emotionally Abused: No    Physically Abused: No    Sexually Abused: No   Family History  Problem Relation Age of Onset   Diabetes Father    Cancer Father 80       Esophogeal Ca., Lung.   Hypertension Other    Thyroid disease Other    Colon cancer Daughter 52   Glaucoma Mother    Hepatitis C Mother 61       Died from.   Glaucoma Sister    Glaucoma Sister    Glaucoma Maternal Aunt    ROS: All systems reviewed and negative except as per HPI.   Current Outpatient Medications  Medication Sig Dispense Refill   atorvastatin (LIPITOR) 40 MG tablet Take 1 tablet (40 mg total) by mouth daily. 90 tablet 3   B-D ULTRAFINE III SHORT PEN 31G X 8 MM MISC SUB-Q Daily 100 each 4   Calcium Carbonate-Vit D-Min (CALCIUM 1200 PO) Take 1 tablet by mouth in the morning and at bedtime.     Continuous Blood Gluc Sensor (DEXCOM G7 SENSOR) MISC Apply every 10 days. 9 each 1   ezetimibe (ZETIA) 10 MG tablet Take 1 tablet (10 mg total) by mouth daily. 90 tablet 3   Insulin Degludec (TRESIBA [place]) Inject 50 Units into the skin daily.     metFORMIN (GLUCOPHAGE) 500 MG tablet Take 1 tablet (500 mg total) by mouth 2 (two) times daily with a meal. 180 tablet 1   metoprolol succinate (TOPROL XL) 50 MG 24 hr tablet Take 1 tablet (50 mg total) by mouth daily. 90 tablet 3   Omega-3 Fatty Acids (FISH OIL) 1200 MG CAPS Take 1 capsule by mouth daily.     omeprazole (PRILOSEC) 20 MG capsule Take 20 mg by mouth daily.     ONETOUCH VERIO test strip DX DM E11.65. CHECK FASTING BLOOD SUGAR EVERY MORNING AND 2 HOURS AFTER LARGE MEALS DAILY. CHECK 3 TIMES DAILY 300 strip PRN   Probiotic Product (PROBIOTIC PO) Take by mouth.     rOPINIRole (REQUIP) 4 MG tablet Take 1 tablet (4 mg total) by mouth daily. 90 tablet 0   sacubitril-valsartan (ENTRESTO) 97-103 MG Take 1 tablet by mouth 2 (two) times daily. 60 tablet 11    Semaglutide (RYBELSUS) 14 MG TABS Take 1 tablet by mouth daily.     spironolactone (ALDACTONE) 50 MG tablet Take 1 tablet (50 mg total) by mouth daily. 90 tablet 3   No current facility-administered medications for this visit.   There were no vitals taken for this visit. General: NAD Neck: No JVD,  no thyromegaly or thyroid nodule.  Lungs: Clear to auscultation bilaterally with normal respiratory effort. CV: Nondisplaced PMI.  Heart regular S1/S2, no S3/S4, no murmur.  No peripheral edema.  No carotid bruit.  Normal pedal pulses.  Abdomen: Soft, nontender, no hepatosplenomegaly, no distention.  Skin: Intact without lesions or rashes.  Neurologic: Alert and oriented x 3.  Psych: Normal affect. Extremities: No clubbing or cyanosis.  HEENT: Normal.   Assessment/Plan: 1. Chronic systolic CHF: Nonischemic cardiomyopathy.  Coronary CTA in 11/22 with mild nonobstructive CAD.  Echo in 11/22 showed on my review EF 30%, normal RV size and systolic function, mild MR, mild AI, IVC normal. No ETOH.  No FH of cardiomyopathy.  No arrhythmias have been noted.  She may have a LBBB cardiomyopathy.  Repeat echo in 6/23 had EF read as 50-55%, looks no more than about 40% to me on my review. NYHA class II symptoms with 11 lbs weight loss, some improvement.  She is not volume overloaded on exam today.  - She tolerated Coreg poorly, now on Toprol XL with no problems.  Increase Toprol XL to 50 mg daily.  - Continue dapagliflozin 10 mg daily.  - Continue spironolactone 25 mg daily, BMET/BNP today.  - Continue Entresto 97/103 bid.   - With borderline EF by echo, will arrange for cardiac MRI.  If EF < 35%, she will need CRT-D device (LBBB).  I will give her low dose valium to take prior to MRI due to claustrophobia/anxiety.  2. HTN: BP controlled.  3. OSA: Continue CPAP.   Followup 3 months with APP.   [place] 08/13/2022

## 2022-08-14 NOTE — Telephone Encounter (Signed)
Patient's Husband picked up medication - CF

## 2022-08-15 ENCOUNTER — Encounter (HOSPITAL_COMMUNITY): Payer: Self-pay

## 2022-08-15 ENCOUNTER — Other Ambulatory Visit (HOSPITAL_COMMUNITY): Payer: Self-pay

## 2022-08-15 ENCOUNTER — Ambulatory Visit (HOSPITAL_COMMUNITY)
Admission: RE | Admit: 2022-08-15 | Discharge: 2022-08-15 | Disposition: A | Discharge status: Home / Self Care | Payer: Medicare Other | Source: Ambulatory Visit | Attending: Family Medicine | Admitting: Family Medicine

## 2022-08-15 VITALS — BP 104/72 | HR 69 | Wt 190.2 lb

## 2022-08-15 DIAGNOSIS — Z79899 Other long term (current) drug therapy: Secondary | ICD-10-CM | POA: Insufficient documentation

## 2022-08-15 DIAGNOSIS — I2510 Atherosclerotic heart disease of native coronary artery without angina pectoris: Secondary | ICD-10-CM | POA: Insufficient documentation

## 2022-08-15 DIAGNOSIS — I10 Essential (primary) hypertension: Secondary | ICD-10-CM | POA: Diagnosis not present

## 2022-08-15 DIAGNOSIS — Z7901 Long term (current) use of anticoagulants: Secondary | ICD-10-CM | POA: Insufficient documentation

## 2022-08-15 DIAGNOSIS — R0600 Dyspnea, unspecified: Secondary | ICD-10-CM | POA: Diagnosis not present

## 2022-08-15 DIAGNOSIS — I110 Hypertensive heart disease with heart failure: Secondary | ICD-10-CM | POA: Insufficient documentation

## 2022-08-15 DIAGNOSIS — I428 Other cardiomyopathies: Secondary | ICD-10-CM | POA: Insufficient documentation

## 2022-08-15 DIAGNOSIS — G4733 Obstructive sleep apnea (adult) (pediatric): Secondary | ICD-10-CM | POA: Diagnosis not present

## 2022-08-15 DIAGNOSIS — I5022 Chronic systolic (congestive) heart failure: Secondary | ICD-10-CM | POA: Insufficient documentation

## 2022-08-15 DIAGNOSIS — I447 Left bundle-branch block, unspecified: Secondary | ICD-10-CM | POA: Diagnosis not present

## 2022-08-15 LAB — BASIC METABOLIC PANEL
Anion gap: 9 (ref 5–15)
BUN: 19 mg/dL (ref 8–23)
CO2: 22 mmol/L (ref 22–32)
Calcium: 10.2 mg/dL (ref 8.9–10.3)
Chloride: 108 mmol/L (ref 98–111)
Creatinine, Ser: 0.67 mg/dL (ref 0.44–1.00)
GFR, Estimated: >60 mL/min (ref >60)
Glucose, Bld: 187 mg/dL — ABNORMAL HIGH (ref 70–99)
Potassium: 4.4 mmol/L (ref 3.5–5.1)
Sodium: 139 mmol/L (ref 135–145)

## 2022-08-15 MED ORDER — DAPAGLIFLOZIN PROPANEDIOL 10 MG PO TABS: 10.0000 mg | ORAL_TABLET | Freq: Every day | ORAL | 11 refills | Status: DC

## 2022-08-15 NOTE — Progress Notes (Addendum)
Medication Samples have been provided to the patient.  Drug name: farxiag       Strength: 10 mg        Qty: 28  LOT: [id number]  Exp.Date: 08/2024  Dosing instructions: one tab daily  The patient has been instructed regarding the correct time, dose, and frequency of taking this medication, including desired effects and most common side effects.   Garlan Fair M 11:40 AM 08/15/2022

## 2022-08-15 NOTE — Patient Instructions (Addendum)
STOP Coreg  START Farxiga 10 mg one tab daily  Labs today We will only contact you if something comes back abnormal or we need to make some changes. Otherwise no news is good news!   Your physician wants you to follow-up in: 4 months  You will receive a reminder letter in the mail two months in advance. If you don't receive a letter, please call our office to schedule the follow-up appointment.   Do the following things EVERYDAY: Weigh yourself in the morning before breakfast. Write it down and keep it in a log. Take your medicines as prescribed Eat low salt foods--Limit salt (sodium) to 2000 mg per day.  Stay as active as you can everyday Limit all fluids for the day to less than 2 liters   At the [facility], you and your health needs are our priority. As part of our continuing mission to provide you with exceptional heart care, we have created designated Provider Care Teams. These Care Teams include your primary Cardiologist (physician) and Advanced Practice Providers (APPs- Physician Assistants and Nurse Practitioners) who all work together to provide you with the care you need, when you need it.   You may see any of the following providers on your designated Care Team at your next follow up: Dr Glori Bickers Dr Loralie Champagne Dr. Roxana Hires, NP Lyda Jester, [place] [facility] [place], [place] Forestine Na, NP Audry Riles, PharmD   Please be sure to bring in all your medications bottles to every appointment.   If you have any questions or concerns before your next appointment please send [place] a message through [place] or call our office at [phone].    TO LEAVE A MESSAGE FOR THE NURSE SELECT OPTION 2, PLEASE LEAVE A MESSAGE INCLUDING: YOUR NAME DATE OF BIRTH CALL BACK NUMBER REASON FOR CALL**this is important as we prioritize the call backs  YOU WILL RECEIVE A CALL BACK THE SAME DAY AS LONG AS YOU CALL BEFORE 4:00 PM

## 2022-08-21 ENCOUNTER — Ambulatory Visit (HOSPITAL_BASED_OUTPATIENT_CLINIC_OR_DEPARTMENT_OTHER): Payer: Medicare Other | Attending: Cardiology | Admitting: Cardiology

## 2022-08-21 DIAGNOSIS — G4733 Obstructive sleep apnea (adult) (pediatric): Secondary | ICD-10-CM | POA: Diagnosis not present

## 2022-08-22 NOTE — Procedures (Signed)
   Patient Name: Jillian Huynh, Jillian Huynh Date: 08/21/2022 Gender: Female D.O.B: 09-Mar-1946 Age (years): 62 Referring Provider: Fransico Him MD, ABSM Height (inches): 62 Interpreting Physician: Fransico Him MD, ABSM Weight (lbs): 190 RPSGT: Carolin Coy BMI: 35 MRN: [id number] Neck Size: 14.50  CLINICAL INFORMATION The patient is referred for a BiPAP titration to treat sleep apnea.  SLEEP STUDY TECHNIQUE As per the AASM Manual for the Scoring of Sleep and Associated Events v2.3 (April 2016) with a hypopnea requiring 4% desaturations.  The channels recorded and monitored were frontal, central and occipital EEG, electrooculogram (EOG), submentalis EMG (chin), nasal and oral airflow, thoracic and abdominal wall motion, anterior tibialis EMG, snore microphone, electrocardiogram, and pulse oximetry. Bilevel positive airway pressure (BPAP) was initiated at the beginning of the study and titrated to treat sleep-disordered breathing.  MEDICATIONS Medications self-administered by patient taken the night of the study : N/A  RESPIRATORY PARAMETERS Optimal IPAP Pressure (cm): 22  AHI at Optimal Pressure (/hr) 14.2 Optimal EPAP Pressure (cm):18  Overall Minimal O2 (%):88.0  Minimal O2 at Optimal Pressure (%): 93.0  SLEEP ARCHITECTURE Start Time:10:16:07 PM  Stop Time:4:56:10 AM  Total Time (min):400  Total Sleep Time (min):292.5 Sleep Latency (min):3.6  Sleep Efficiency (%):73.1%  REM Latency (min):168.0  WASO (min):103.9 Stage N1 (%): 23.9%  Stage N2 (%): 65.0%  Stage N3 (%): 0.0%  Stage R (%):11.1 Supine (%):55.22  Arousal Index (/hr):35.5   CARDIAC DATA The 2 lead EKG demonstrated sinus rhythm. The mean heart rate was 58.8 beats per minute. Other EKG findings include: None.  LEG MOVEMENT DATA The total Periodic Limb Movements of Sleep (PLMS) were 0. The PLMS index was 0.0. A PLMS index of <15 is considered normal in adults.  IMPRESSIONS - An optimal PAP pressure was selected  for this patient ( 22 / cm of water) - Central sleep apnea was not noted during this titration (CAI = 0.4/h). - Mild oxygen desaturations were observed during this titration (min O2 = 88.0%). - The patient snored with soft snoring volume. - No cardiac abnormalities were observed during this study. - Clinically significant periodic limb movements were not noted during this study. Arousals associated with PLMs were rare.  DIAGNOSIS - Obstructive Sleep Apnea (G47.33)  RECOMMENDATIONS - Trial of ResMed auto BiPAP therapy with IPAP max 20cm H2O, EPAP min 5cm H2O and PS 4cm H2O with a Small size Fisher&Paykel Full Face Simplus mask and heated humidification. - Avoid alcohol, sedatives and other CNS depressants that may worsen sleep apnea and disrupt normal sleep architecture. - Sleep hygiene should be reviewed to assess factors that may improve sleep quality. - Weight management and regular exercise should be initiated or continued. - Return to Sleep Center for re-evaluation after 6 weeks of therapy  [Electronically signed] 08/22/2022 09:27 AM  Fransico Him MD, ABSM Diplomate, American Board of Sleep Medicine

## 2022-08-27 ENCOUNTER — Encounter: Payer: Self-pay | Admitting: Family Medicine

## 2022-08-27 ENCOUNTER — Ambulatory Visit (INDEPENDENT_AMBULATORY_CARE_PROVIDER_SITE_OTHER): Payer: Medicare Other | Admitting: Family Medicine

## 2022-08-27 VITALS — BP 96/55 | HR 69 | Ht 62.0 in | Wt 188.0 lb

## 2022-08-27 DIAGNOSIS — I10 Essential (primary) hypertension: Secondary | ICD-10-CM

## 2022-08-27 DIAGNOSIS — E118 Type 2 diabetes mellitus with unspecified complications: Secondary | ICD-10-CM

## 2022-08-27 DIAGNOSIS — I5022 Chronic systolic (congestive) heart failure: Secondary | ICD-10-CM | POA: Diagnosis not present

## 2022-08-27 DIAGNOSIS — R5383 Other fatigue: Secondary | ICD-10-CM | POA: Diagnosis not present

## 2022-08-27 LAB — POCT GLYCOSYLATED HEMOGLOBIN (HGB A1C): HbA1c POC (<> result, manual entry): 8.2 % (ref 4.0–5.6)

## 2022-08-27 NOTE — Assessment & Plan Note (Signed)
Controlled.  No sign of volume overload.  Continue current regimen for now.  She is also back on Farxiga further the last week and that can drop blood pressure a little bit more.

## 2022-08-27 NOTE — Progress Notes (Addendum)
Established Patient Office Visit  Subjective   Patient ID: Jillian Huynh, female    DOB: [birth year]  Age: 76 y.o. MRN: [id number]  Chief Complaint  Patient presents with   Follow-up    HPI  Diabetes - no hypoglycemic events. No wounds or sores that are not healing well. No increased thirst or urination. Checking glucose at home. Taking medications as prescribed without any side effects.  Currently on Tresiba and Rybelsus.  Ran out of the [place].  He is also currently wearing a Dexcom.  Reports that her blood sugars have now been in the 140s since she was able to restart her [place] about a week ago.  Has been in tremendous difference.  It just took a while to get it discounted and covered she still paying a little over $100 a month.  Using 50 units of Tresiba.   Her biggest concern really is just fatigue.  It just been really difficult since she had COVID and started having a lot of heart symptoms she still just has not really regained her energy she says about the only thing that she can do consistently is ".  It has been a big strain on her.  Is been frustrating to not be able to just go into.    ROS    Objective:     BP (!) 96/55   Pulse 69   Ht '5\' 2"'$  (1.575 m)   Wt 188 lb (85.3 kg)   SpO2 99%   BMI 34.39 kg/m    Physical Exam Constitutional:      Appearance: She is well-developed.  HENT:     Head: Normocephalic and atraumatic.  Cardiovascular:     Rate and Rhythm: Normal rate and regular rhythm.     Heart sounds: Normal heart sounds.  Pulmonary:     Effort: Pulmonary effort is normal.     Breath sounds: Normal breath sounds.  Skin:    General: Skin is warm and dry.  Neurological:     Mental Status: She is alert and oriented to person, place, and time.  Psychiatric:        Behavior: Behavior normal.      Results for orders placed or performed in visit on 08/27/22  POCT HgB A1C  Result Value Ref Range   Hemoglobin A1C     HbA1c POC (<> result, manual  entry) 8.2 4.0 - 5.6 %   HbA1c, POC (prediabetic range)     HbA1c, POC (controlled diabetic range)        The ASCVD Risk score (Arnett DK, et al., 2019) failed to calculate for the following reasons:   The valid total cholesterol range is 130 to 320 mg/dL    Assessment & Plan:   Problem List Items Addressed This Visit       Cardiovascular and Mediastinum   Systolic CHF ([place])    Controlled.  No sign of volume overload.  Continue current regimen for now.  She is also back on Farxiga further the last week and that can drop blood pressure a little bit more.      BP (high blood pressure)    Pressure is definitely run on the low end to help control her heart failure she is really doing well on the Entresto.  But still struggling with pretty extreme fatigue.  She will address further with Dr. Stanford Breed next week.        Endocrine   Diabetes mellitus type 2, controlled ([place]) - Primary  1C is still elevated but it sounds like since restarting Farxiga blood sugars have been trending back down with fastings running more in the 140s now which is fantastic.  I think the next A1c in 3 months will look fantastic.  Continue to work on [employer].      Relevant Orders   POCT HgB A1C (Completed)   Other Visit Diagnoses     Other fatigue           Fatigue-I really feel like it is directly related to her lower blood pressures.  Encouraged her to speak with cardiology about it next week.  Maybe they can make a slight adjustment that might allow her blood pressure to come up maybe 10 points I think she would truly feel better especially with the addition of the [place] I suspect it is dropping the pressure even lower.  Return in about 3 months (around 11/27/2022) for Diabetes follow-up.    Beatrice Lecher, MD

## 2022-08-27 NOTE — Assessment & Plan Note (Signed)
1C is still elevated but it sounds like since restarting Farxiga blood sugars have been trending back down with fastings running more in the 140s now which is fantastic.  I think the next A1c in 3 months will look fantastic.  Continue to work on [employer].

## 2022-08-27 NOTE — Assessment & Plan Note (Signed)
Pressure is definitely run on the low end to help control her heart failure she is really doing well on the Entresto.  But still struggling with pretty extreme fatigue.  She will address further with Dr. Stanford Breed next week.

## 2022-08-29 ENCOUNTER — Ambulatory Visit (INDEPENDENT_AMBULATORY_CARE_PROVIDER_SITE_OTHER): Payer: Medicare Other | Admitting: Pharmacist

## 2022-08-29 DIAGNOSIS — I5022 Chronic systolic (congestive) heart failure: Secondary | ICD-10-CM

## 2022-08-29 DIAGNOSIS — I10 Essential (primary) hypertension: Secondary | ICD-10-CM

## 2022-08-29 DIAGNOSIS — E118 Type 2 diabetes mellitus with unspecified complications: Secondary | ICD-10-CM

## 2022-08-29 DIAGNOSIS — E1169 Type 2 diabetes mellitus with other specified complication: Secondary | ICD-10-CM

## 2022-08-29 NOTE — Progress Notes (Signed)
HPI: FU CHF. ABIs October 2022 normal. Echocardiogram November 2022 showed septal hypokinesis, ejection fraction 45 to 50%, moderate left ventricular hypertrophy, grade 1 diastolic dysfunction, moderate mitral regurgitation, mild aortic insufficiency.  I did review this and felt EF was lower than 45 to 50%.  Cardiac CTA November 2022 showed calcium score 162 which was 70th percentile and minimal nonobstructive disease in the LAD and circumflex.  Patient was initiated on medical therapy.  Cardiac MRI August 2023 showed ejection fraction 49% with septal/lateral dyssynchrony consistent with left bundle branch block, normal RV function.  Since last seen, she has some dyspnea on exertion but no orthopnea, PND, pedal edema, chest pain or syncope.  She continues to complain of severe fatigue.  Current Outpatient Medications  Medication Sig Dispense Refill   atorvastatin (LIPITOR) 40 MG tablet Take 1 tablet (40 mg total) by mouth daily. 90 tablet 3   B-D ULTRAFINE III SHORT PEN 31G X 8 MM MISC SUB-Q Daily 100 each 4   Calcium Carbonate-Vit D-Min (CALCIUM 1200 PO) Take 1 tablet by mouth in the morning and at bedtime.     carvedilol (COREG) 6.25 MG tablet Take 6.25 mg by mouth 2 (two) times daily with a meal.     dapagliflozin propanediol (FARXIGA) 10 MG TABS tablet Take 1 tablet (10 mg total) by mouth daily before breakfast. 30 tablet 11   ezetimibe (ZETIA) 10 MG tablet Take 1 tablet (10 mg total) by mouth daily. 90 tablet 3   Insulin Degludec (TRESIBA [place]) Inject 50 Units into the skin daily.     metFORMIN (GLUCOPHAGE) 500 MG tablet Take 1 tablet (500 mg total) by mouth 2 (two) times daily with a meal. 180 tablet 1   metoprolol succinate (TOPROL XL) 50 MG 24 hr tablet Take 1 tablet (50 mg total) by mouth daily. 90 tablet 3   omeprazole (PRILOSEC) 20 MG capsule Take 20 mg by mouth daily.     ONETOUCH VERIO test strip DX DM E11.65. CHECK FASTING BLOOD SUGAR EVERY MORNING AND 2 HOURS AFTER LARGE MEALS  DAILY. CHECK 3 TIMES DAILY 300 strip PRN   rOPINIRole (REQUIP) 4 MG tablet Take 1 tablet (4 mg total) by mouth daily. 90 tablet 0   sacubitril-valsartan (ENTRESTO) 97-103 MG Take 1 tablet by mouth 2 (two) times daily. 180 tablet 3   Semaglutide (RYBELSUS) 14 MG TABS Take 1 tablet by mouth daily.     spironolactone (ALDACTONE) 50 MG tablet Take 1 tablet (50 mg total) by mouth daily. 90 tablet 3   No current facility-administered medications for this visit.     Past Medical History:  Diagnosis Date   Diabetes mellitus without complication ([place])    Hyperlipidemia    Hypertension     Past Surgical History:  Procedure Laterality Date   CHOLECYSTECTOMY      Social History   Socioeconomic History   Marital status: Married    Spouse name: Biochemist, clinical   Number of children: 3   Years of education: 16.5   Highest education level: Some college, no degree  Occupational History   Occupation: Hair Stylist  Tobacco Use   Smoking status: Never   Smokeless tobacco: Never  Substance and Sexual Activity   Alcohol use: No    Alcohol/week: 0.0 standard drinks of alcohol   Drug use: No   Sexual activity: Yes    Partners: Male  Other Topics Concern   Not on file  Social History Narrative   Lives with her  husband. She is working full time as a Probation officer. Two of her children live close by. One is in [place]. One of her daughter's is deceased. Her hobbies include watch tv, listening to music and reading.    Social Determinants of Health   Financial Resource Strain: Low Risk  (07/22/2021)   Overall Financial Resource Strain (CARDIA)    Difficulty of Paying Living Expenses: Not hard at all  Food Insecurity: No Food Insecurity (07/22/2021)   Hunger Vital Sign    Worried About Running Out of Food in the Last Year: Never true    Ran Out of Food in the Last Year: Never true  Transportation Needs: No Transportation Needs (07/22/2021)   PRAPARE - Hydrologist  (Medical): No    Lack of Transportation (Non-Medical): No  Physical Activity: Inactive (07/22/2021)   Exercise Vital Sign    Days of Exercise per Week: 0 days    Minutes of Exercise per Session: 0 min  Stress: No Stress Concern Present (07/22/2021)   [place]    Feeling of Stress : Not at all  Social Connections: Moderately Integrated (07/22/2021)   Social Connection and Isolation Panel [NHANES]    Frequency of Communication with Friends and Family: More than three times a week    Frequency of Social Gatherings with Friends and Family: More than three times a week    Attends Religious Services: Never    Marine scientist or Organizations: Yes    Attends Archivist Meetings: 1 to 4 times per year    Marital Status: Married  Human resources officer Violence: Not At Risk (07/22/2021)   Humiliation, Afraid, Rape, and Kick questionnaire    Fear of Current or Ex-Partner: No    Emotionally Abused: No    Physically Abused: No    Sexually Abused: No    Family History  Problem Relation Age of Onset   Diabetes Father    Cancer Father 80       Esophogeal Ca., Lung.   Hypertension Other    Thyroid disease Other    Colon cancer Daughter 62   Glaucoma Mother    Hepatitis C Mother 33       Died from.   Glaucoma Sister    Glaucoma Sister    Glaucoma Maternal Aunt     ROS: no fevers or chills, productive cough, hemoptysis, dysphasia, odynophagia, melena, hematochezia, dysuria, hematuria, rash, seizure activity, orthopnea, PND, pedal edema, claudication. Remaining systems are negative.  Physical Exam: Well-developed well-nourished in no acute distress.  Skin is warm and dry.  HEENT is normal.  Neck is supple.  Chest is clear to auscultation with normal expansion.  Cardiovascular exam is regular rate and rhythm.  Abdominal exam nontender or distended. No masses palpated. Extremities show no edema. neuro grossly  intact   A/P  1 nonischemic cardiomyopathy-etiology unclear but possibly secondary to dyssynchrony related to left bundle branch block.  She did not have obstructive coronary disease based on her CTA.  No history of alcohol abuse.  Plan will be to continue medical therapy.  Continue Entresto, Toprol, Farxiga and spironolactone.  Note her medication list states she is also on carvedilol and we will discontinue given that she is on Toprol.  Most recent ejection fraction noted to be mildly reduced on cardiac MRI.  Therefore would not consider CRT.  2 chronic combined systolic/diastolic congestive heart failure-continue spironolactone and Farxiga at present dose.  3 hypertension-patient's blood pressure is borderline with systolic in the 87O.  She is complaining of fatigue.  Decrease spironolactone from 50 to 25 mg daily and discontinue carvedilol given that she is on Toprol as outlined above.  Follow blood pressure.  4 hyperlipidemia-continue statin.  5 valvular heart disease-moderate mitral regurgitation and mild aortic insufficiency on previous echocardiogram.  Plan repeat study November 2023.  6 minimal nonobstructive coronary disease-continue statin.  7 obstructive sleep apnea-Per Dr. Radford Pax.  Kirk Ruths, MD

## 2022-08-29 NOTE — Progress Notes (Signed)
Chronic Care Management Pharmacy Note  08/29/2022 Name:  Jillian Huynh MRN:  [id number] DOB:  12/06/1945  Summary: addressed HTN, HLD, and primarily DM. Rybelsus and tresiba are obtained via patient assistance.   She did run out of [place], but recently obtained the medication for $100 instead of the prior $400 barrier.  BG fasting per Dexcom is 152, 142 (prior was 180-190s). Patient still describes nutrition could be better.  She is having significant fatigue and low blood pressure.  Wilder Glade & Delene Loll was previously on a grant by patient access network, but has been instructed to check her email daily for when this funding will reopen. We are also working toward patient assistance, unfortunately had some errors in the forms and so at today's visit, resubmitting.  Recommendations/Changes made from today's visit:   - No changes to medication from this visit, provided education, care coordination, and set up patient with the following: - Wilder Glade & Entresto patient assistance corrected paperwork  - Recommend patient discuss fatigue and low blood pressure at upcoming cardiologist visit, consider dose reduction at discretion of cardiologist.   Plan: f/u with pharmacist in 3-4 weeks  Subjective: Jillian Huynh is an 76 y.o. year old female who is a primary patient of Jillian Huynh, Jillian Kocher, MD.  The CCM team was consulted for assistance with disease management and care coordination needs.    Engaged with patient by telephone for follow up visit in response to provider referral for pharmacy case management and/or care coordination services.   Consent to Services:  The patient was given information about Chronic Care Management services, agreed to services, and gave verbal consent prior to initiation of services.  Please see initial visit note for detailed documentation.   Patient Care Team: Jillian Marry, MD as PCP - General (Family Medicine) Jillian Margarita, MD as PCP - Sleep  Medicine (Cardiology) Darius Bump, [facility] as Pharmacist (Pharmacist)   Objective:  Lab Results  Component Value Date   CREATININE 0.67 08/15/2022   CREATININE 0.83 05/14/2022   CREATININE 0.66 02/26/2022    Lab Results  Component Value Date   HGBA1C 8.2 08/27/2022   Last diabetic Eye exam:  Lab Results  Component Value Date/Time   HMDIABEYEEXA No Retinopathy 08/23/2021 12:00 AM        Component Value Date/Time   CHOL 110 12/25/2021 1104   TRIG 188 (H) 12/25/2021 1104   HDL 40 (L) 12/25/2021 1104   CHOLHDL 2.8 12/25/2021 1104   VLDL 25 12/11/2021 1244   LDLCALC 44 12/25/2021 1104       Latest Ref Rng & Units 12/25/2021   11:04 AM 10/24/2021    8:51 AM 12/26/2020   11:24 AM  Hepatic Function  Total Protein 6.1 - 8.1 g/dL 6.7  6.5  6.6   AST 10 - 35 U/L '12  9  22   '$ ALT 6 - 29 U/L 11  14  34   Total Bilirubin 0.2 - 1.2 mg/dL 0.9  0.8  1.2   Bilirubin, Direct 0.0 - 0.2 mg/dL 0.1  0.1      Lab Results  Component Value Date/Time   TSH 3.25 04/11/2022 11:39 AM   TSH 3.48 09/18/2021 03:23 PM       Latest Ref Rng & Units 04/11/2022   11:39 AM 09/18/2021    3:23 PM 12/26/2020   11:24 AM  CBC  WBC 3.8 - 10.8 Thousand/uL 7.5  7.9  6.7   Hemoglobin 11.7 - 15.5 g/dL 13.4  13.3  12.7   Hematocrit 35.0 - 45.0 % 39.9  40.1  37.8   Platelets 140 - 400 Thousand/uL 264  275  254     Lab Results  Component Value Date/Time   VD25OH 26 (L) 03/24/2015 09:08 AM    Clinical ASCVD: Yes  The ASCVD Risk score (Arnett DK, et al., 2019) failed to calculate for the following reasons:   The valid total cholesterol range is 130 to 320 mg/dL     Social History   Tobacco Use  Smoking Status Never  Smokeless Tobacco Never   BP Readings from Last 3 Encounters:  08/27/22 (!) 96/55  08/15/22 104/72  07/18/22 108/73   Pulse Readings from Last 3 Encounters:  08/27/22 69  08/15/22 69  07/18/22 66   Wt Readings from Last 3 Encounters:  08/27/22 188 lb (85.3 kg)  08/21/22  190 lb (86.2 kg)  08/15/22 190 lb 3.2 oz (86.3 kg)    Assessment: Review of patient past medical history, allergies, medications, health status, including review of consultants reports, laboratory and other test data, was performed as part of comprehensive evaluation and provision of chronic care management services.   SDOH:  (Social Determinants of Health) assessments and interventions performed:  SDOH Interventions    Flowsheet Row Office Visit from 08/27/2022 in [place] Office Visit from 05/21/2022 in [place] Office Visit from 05/08/2021 in [place] Office Visit from 01/30/2021 in [place] Visit from 12/09/2019 in [place] Interventions -- -- -- Intervention Not Indicated --  Housing Interventions -- -- -- Intervention Not Indicated --  Transportation Interventions -- -- -- Intervention Not Indicated --  Depression Interventions/Treatment  PHQ2-9 Score <4 Follow-up Not Indicated PHQ2-9 Score <4 Follow-up Not Indicated Currently on Treatment -- Counseling  Financial Strain Interventions -- -- -- Intervention Not Indicated --  Physical Activity Interventions -- -- -- Intervention Not Indicated --  Stress Interventions -- -- -- Intervention Not Indicated --  Social Connections Interventions -- -- -- Intervention Not Indicated --       CCM Care Plan  Allergies  Allergen Reactions   Bydureon [Exenatide] Nausea And Vomiting   Glipizide Other (See Comments)    Leg cramps, fatigue, and weight gain   Trulicity [Dulaglutide] Diarrhea   Invokana [Canagliflozin] Other (See Comments)    Yeast infection   Victoza [Liraglutide] Nausea Only and Other (See Comments)    Constipation,belching,bloating    Medications Reviewed Today     Reviewed by Darius Bump, [facility] (Pharmacist) on 08/29/22 at 1545  Med List Status: <None>   Medication Order Taking? Sig Documenting Provider Last Dose Status Informant  atorvastatin (LIPITOR) 40 MG tablet [id number] No Take 1 tablet (40 mg total) by mouth daily. Lelon Perla, MD Taking Active   B-D ULTRAFINE III SHORT PEN 31G X 8 MM MISC [id number] No SUB-Q Daily Jillian Marry, MD Taking Active   Calcium Carbonate-Vit D-Min (CALCIUM 1200 PO) [id number] No Take 1 tablet by mouth in the morning and at bedtime. [provider] Taking Active            Med Note [facility]   Mon Feb 26, 2022 10:31 AM)    Continuous Blood Gluc Sensor (DEXCOM G7 SENSOR) [place] [id number] No Apply every 10  days. Jillian Marry, MD Taking Active   dapagliflozin propanediol (FARXIGA) 10 MG TABS tablet [id number]  Take 1 tablet (10 mg total) by mouth daily before breakfast. Rafael Bihari, FNP  Active   ezetimibe (ZETIA) 10 MG tablet [id number] No Take 1 tablet (10 mg total) by mouth daily. Lelon Perla, MD Taking Active            Med Note [facility]   Mon Feb 26, 2022 10:30 AM)    Insulin Degludec (TRESIBA [place]) [id number] No Inject 50 Units into the skin daily. [provider] Taking Active   metFORMIN (GLUCOPHAGE) 500 MG tablet [id number] No Take 1 tablet (500 mg total) by mouth 2 (two) times daily with a meal. Jillian Marry, MD Taking Active   metoprolol succinate (TOPROL XL) 50 MG 24 hr tablet [id number] No Take 1 tablet (50 mg total) by mouth daily. Larey Dresser, MD Taking Active   Omega-3 Fatty Acids (FISH OIL) 1200 MG CAPS [id number] No Take 1 capsule by mouth daily. [provider] Taking Active   omeprazole (PRILOSEC) 20 MG capsule [id number] No Take 20 mg by mouth daily. [provider] Taking Active   [facility] VERIO test strip [id number] No DX DM E11.65. CHECK FASTING BLOOD SUGAR EVERY MORNING AND 2 HOURS AFTER LARGE MEALS DAILY. CHECK 3 TIMES DAILY  Jillian Marry, MD Taking Active   Probiotic Product (PROBIOTIC PO) [id number] No Take by mouth. As needed [provider] Taking Active   rOPINIRole (REQUIP) 4 MG tablet [id number] No Take 1 tablet (4 mg total) by mouth daily. Jillian Marry, MD Taking Active   sacubitril-valsartan [facility]) 97-103 MG [id number] No Take 1 tablet by mouth 2 (two) times daily. Larey Dresser, MD Taking Active   Semaglutide [facility]) 14 MG TABS [id number] No Take 1 tablet by mouth daily. [provider] Taking Active   spironolactone (ALDACTONE) 50 MG tablet [id number] No Take 1 tablet (50 mg total) by mouth daily. Larey Dresser, MD Taking Active             Patient Active Problem List   Diagnosis Date Noted   Systolic CHF [facility]) 40/97/3532   Cardiomyopathy ([place]) 05/21/2022   Left bundle branch block (LBBB) determined by electrocardiography 08/08/2021   Gastroesophageal reflux disease without esophagitis 09/19/2020   Severe obesity (BMI 35.0-39.9) with comorbidity ([place]) 05/25/2019   Fatty liver 12/25/2017   BP (high blood pressure) 03/28/2015   Dyslipidemia 03/28/2015   Insomnia 03/28/2015   Restless leg 03/28/2015   Mass of arm 03/14/2015   Diabetes mellitus type 2, controlled ([place]) 03/14/2015   Obstructive apnea 09/03/2012   HYPERKERATOSIS 04/01/2009    Immunization History  Administered Date(s) Administered   Moderna Sars-Covid-2 Vaccination 01/06/2020, 02/12/2020   Pneumococcal Conjugate-13 12/25/2017   Tdap 03/26/2007, 06/29/2015    Conditions to be addressed/monitored: HTN, HLD, and DMII  Care Plan : Medication Management  Updates made by Darius Bump, [place] since 08/29/2022 12:00 AM     Problem: HTN, DM, HLD   Priority: High     Long-Range Goal: Disease Progression Prevention   Recent Progress: On track  Priority: High  Note:   Current Barriers:  Unable to achieve control of diabetes   Pharmacist Clinical Goal(s):  Over the next 60 days,  patient will achieve control of diabetes as evidenced by blood glucose values and A1c through collaboration with PharmD and provider.   Interventions: 1:1 collaboration with Jillian Marry, MD regarding  development and update of comprehensive plan of care as evidenced by provider attestation and co-signature Inter-disciplinary care team collaboration (see longitudinal plan of care) Comprehensive medication review performed; medication list updated in electronic medical record  Diabetes:  Uncontrolled; tresiba 50 units daily, farxiga '10mg'$  daily, rybelsus '14mg'$  daily  Current glucose readings: via CGM - switched to Dexcom  Date of Download: 04/11/22 % Time CGM is active: 92% Average Glucose: 187 mg/dL Glucose Management Indicator: 7.8%  Glucose Variability: 16.4% (goal <36%) Time in Goal:  - Time in range 70-180: 49% - Time above range: 51% - Time below range: 0% Observed patterns:dinner still the time of highest readings, one hypoglycemic reading may 22, with exercise  Date of Download: 12/27/21 % Time CGM is active: 97% Average Glucose: 197 mg/dL Glucose Management Indicator: 8%  Glucose Variability: 21.4% (goal <36%) Time in Goal:  - Time in range 70-180: 45% - Time above range: 55% - Time below range: 0% Observed patterns: dinner still the time of highest readings  Date of Download: 11/08/21 % Time CGM is active: 91% Average Glucose: 194 mg/dL Glucose Management Indicator: 8.0%  Glucose Variability: 19.2% (goal <36%) Time in Goal:  - Time in range 70-180: 41% - Time above range: 59% - Time below range: 0% Observed patterns: 6-9pm still highest glucose range  Date of Download: 10/09/21 % Time CGM is active: 78% Average Glucose: 192 mg/dL Glucose Management Indicator: 7.9%  Glucose Variability: 21.1% (goal <36%) Time in Goal:  - Time in range 70-180: 46% - Time above range: 54% - Time below range: 0% Observed patterns:erratic, eating habits have changed over  holiday season, which is to be expected.  Date of Download: 09/25/21 % Time CGM is active: 82% Average Glucose: 166 mg/dL Glucose Management Indicator: 7.3%  Glucose Variability: 16.1 (goal <36%) Time in Goal:  - Time in range 70-180: 78% - Time above range: 22% - Time below range: 0% Observed patterns: Still highest range 7-9pm  Date of Download: 09/04/21 % Time CGM is active: 81% Average Glucose: 166 mg/dL Glucose Management Indicator: 7.3%  Glucose Variability: 16.6% (goal <36%) Time in Goal:  - Time in range 70-180: 73% - Time above range: 27% - Time below range: 0% Observed patterns: BG still highest 8-9pm, but still shows improvement  Date of Download: 07/24/21 % Time CGM is active: 85% Average Glucose: 173 mg/dL Glucose Management Indicator: 7.4  Glucose Variability: 16.9 (goal <36%) Time in Goal:  - Time in range 70-180: 65% - Time above range: 35% - Time below range: 0% Observed patterns: still with elevations 8-10pm, after patient gets off of a long day at work she states she eats late. We are working on this.  Date of Download: 07/12/21 % Time CGM is active: 84% Average Glucose: 197 mg/dL Glucose Management Indicator: 8.0%  Glucose Variability: 24.6 (goal <36%) Time in Goal:  - Time in range 70-180: 45% - Time above range: 55% - Time below range: 0% Observed patterns: Still with highest spikes ~9pm, glucose hits 270-300s.   Denies hyper/hypoglycemic symptoms  Previously discussed current meal patterns:  breakfast: oatmeal, eggs, "stays away from white bread";  lunch: ham & cheese 1/2 sandwhich (w/whole wheat), salad, apple;  dinner: pasta, potatoes, attempts meat/vegetables as much as possible;  snacks: crackers, cheese, sneaks cookie, husband brings sweets into house & patient struggles, describes self as "sugar-holic" ;   Making progress toward goals. Patient also expressed cost constraint with tresiba & farxiga. Patient IS eligible for tresiba &  rybelsus  patient assistance, but not farxiga.    Hypertension:  Controlled; entresto 97-'103mg'$  BID, metoprolol XL '50mg'$  daily, spironolactone '50mg'$  daily  Current home readings: not currently checking  Denies hypotensive/hypertensive symptoms  Recommended continue current regimen  Hyperlipidemia:  Controlled; atorvastatin '40mg'$  + ezetemibe '10mg'$  daily;   Recommended continue current regimen  Patient Goals/Self-Care Activities Over the next 30 days, patient will:  Take medications as prescribed and utilize CGM, for data to be discussed at future appointments, check BP and write down for future discussion  Follow Up Plan: Telephone follow up appointment with care management team member scheduled for: 3-4weeks        Medication Assistance:  Rybelsus & tresiba obtained through [employer] medication assistance program.  Enrollment ends TBD.  Also provided application for entresto, pending status update.  Patient's preferred pharmacy is:  [place], [place] - [place] [address] [place] [address] Phone: [phone] Fax: [phone]   Uses pill box? Yes Pt endorses 100% compliance  Follow Up:  Patient agrees to Care Plan and Follow-up.  Plan: Telephone follow up appointment with care management team member scheduled for:  3-4 weeks  Larinda Buttery, PharmD Clinical Pharmacist [facility] [phone]

## 2022-08-29 NOTE — Patient Instructions (Signed)
Visit Information  Thank you for taking time to visit with me today. Please don't hesitate to contact me if I can be of assistance to you before our next scheduled telephone appointment.  Following are the goals we discussed today:   Patient Goals/Self-Care Activities Over the next 30 days, patient will:  Take medications as prescribed and utilize CGM, for data to be discussed at future appointments, check BP and write down for future discussion  Follow Up Plan: Telephone follow up appointment with care management team member scheduled for: 3-4weeks  Please call the care guide team at [phone] if you need to cancel or reschedule your appointment.   Patient verbalizes understanding of instructions and care plan provided today and agrees to view in [place]. Active MyChart status and patient understanding of how to access instructions and care plan via MyChart confirmed with patient.     Jillian Huynh

## 2022-09-05 ENCOUNTER — Other Ambulatory Visit: Payer: Self-pay | Admitting: *Deleted

## 2022-09-05 MED ORDER — ENTRESTO 97-103 MG PO TABS: 1.0000 | ORAL_TABLET | Freq: Two times a day (BID) | ORAL | 3 refills | Status: DC

## 2022-09-11 ENCOUNTER — Telehealth: Payer: Self-pay | Admitting: *Deleted

## 2022-09-11 DIAGNOSIS — E785 Hyperlipidemia, unspecified: Secondary | ICD-10-CM | POA: Diagnosis not present

## 2022-09-11 DIAGNOSIS — E1159 Type 2 diabetes mellitus with other circulatory complications: Secondary | ICD-10-CM

## 2022-09-11 DIAGNOSIS — G4733 Obstructive sleep apnea (adult) (pediatric): Secondary | ICD-10-CM

## 2022-09-11 DIAGNOSIS — I10 Essential (primary) hypertension: Secondary | ICD-10-CM

## 2022-09-11 NOTE — Telephone Encounter (Signed)
-----   Message from Lauralee Evener, [place] sent at 08/27/2022 11:41 AM EDT -----  ----- Message ----- From: Sueanne Margarita, MD Sent: 08/22/2022   9:29 AM EDT To: Cv Div Sleep Studies  Please let patient know that they had a successful PAP titration and let DME know that orders are in EPIC.  Please set up 6 week OV with me.

## 2022-09-11 NOTE — Telephone Encounter (Signed)
Left detailed message on voicemail and informed patient to call back.

## 2022-09-12 ENCOUNTER — Encounter: Payer: Self-pay | Admitting: Cardiology

## 2022-09-12 ENCOUNTER — Ambulatory Visit: Payer: Medicare Other | Admitting: Cardiology

## 2022-09-12 VITALS — BP 94/53 | HR 64 | Ht 62.0 in | Wt 193.4 lb

## 2022-09-12 DIAGNOSIS — I10 Essential (primary) hypertension: Secondary | ICD-10-CM

## 2022-09-12 DIAGNOSIS — I5022 Chronic systolic (congestive) heart failure: Secondary | ICD-10-CM | POA: Diagnosis not present

## 2022-09-12 DIAGNOSIS — E7800 Pure hypercholesterolemia, unspecified: Secondary | ICD-10-CM

## 2022-09-12 DIAGNOSIS — I447 Left bundle-branch block, unspecified: Secondary | ICD-10-CM

## 2022-09-12 DIAGNOSIS — I429 Cardiomyopathy, unspecified: Secondary | ICD-10-CM | POA: Diagnosis not present

## 2022-09-12 MED ORDER — SPIRONOLACTONE 25 MG PO TABS: 25.0000 mg | ORAL_TABLET | Freq: Every day | ORAL | 3 refills | Status: DC

## 2022-09-12 NOTE — Telephone Encounter (Signed)
Return Call: The patient has been notified of the result and verbalized understanding.  All questions (if any) were answered. Jillian Huynh, [place] 09/12/2022 10:00 AM    Upon patient request DME selection is Jillian Huynh. Patient understands he will be contacted by [facility] to set up his cpap. Patient understands to call if [facility] does not contact him with new setup in a timely manner. Patient understands they will be called once confirmation has been received from [facility]/ that they have received their new machine to schedule 10 week follow up appointment.   Jillian Huynh notified of new cpap order  Please add to airview Patient was grateful for the call and thanked me.

## 2022-09-12 NOTE — Patient Instructions (Signed)
Medication Instructions:   STOP CARVEDILOL  DECREASE SPIRONOLACTONE TO 25 MG ONCE DAILY=1/2 OF THE 50 MG TABLET ONCE DAILY  *If you need a refill on your cardiac medications before your next appointment, please call your pharmacy*  Testing/Procedures:  Your physician has requested that you have an echocardiogram. Echocardiography is a painless test that uses sound waves to create images of your heart. It provides your doctor with information about the size and shape of your heart and how well your heart's chambers and valves are working. This procedure takes approximately one hour. There are no restrictions for this procedure. Please do NOT wear cologne, perfume, aftershave, or lotions (deodorant is allowed). Please arrive 15 minutes prior to your appointment time.  [place]: At [facility], you and your health needs are our priority.  As part of our continuing mission to provide you with exceptional heart care, we have created designated Provider Care Teams.  These Care Teams include your primary Cardiologist (physician) and Advanced Practice Providers (APPs -  Physician Assistants and Nurse Practitioners) who all work together to provide you with the care you need, when you need it.  We recommend signing up for the patient portal called "MyChart".  Sign up information is provided on this After Visit Summary.  MyChart is used to connect with patients for Virtual Visits (Telemedicine).  Patients are able to view lab/test results, encounter notes, upcoming appointments, etc.  Non-urgent messages can be sent to your provider as well.   To learn more about what you can do with MyChart, go to [email or web address].    Your next appointment:   3 month(s)  The format for your next appointment:   In Person  Provider:   Kirk Ruths, MD

## 2022-09-14 ENCOUNTER — Telehealth: Payer: Self-pay | Admitting: Family Medicine

## 2022-09-14 NOTE — Telephone Encounter (Signed)
Left message advising of recommendations and for a return call to schedule.

## 2022-09-14 NOTE — Telephone Encounter (Signed)
Please call patient and let her know we need to get her scheduled for her Medicare wellness with [place].  We can do a virtual visit.  Medicare wants [place] to do this visit for all of our Medicare patients as a screening tool.  Also please see where she had her last eye exam performed and if she has gone for follow-up yet so we can get a copy of that report and get her chart updated.

## 2022-09-14 NOTE — Telephone Encounter (Signed)
Pt advised. Transferred her to scheduling for appt. She had her eye exam done with Dr. Randel Pigg.  Will send over med records request

## 2022-09-17 ENCOUNTER — Ambulatory Visit (HOSPITAL_COMMUNITY): Payer: Medicare Other | Attending: Cardiology

## 2022-09-17 DIAGNOSIS — I119 Hypertensive heart disease without heart failure: Secondary | ICD-10-CM | POA: Diagnosis not present

## 2022-09-17 DIAGNOSIS — I429 Cardiomyopathy, unspecified: Secondary | ICD-10-CM | POA: Diagnosis not present

## 2022-09-17 DIAGNOSIS — E119 Type 2 diabetes mellitus without complications: Secondary | ICD-10-CM | POA: Diagnosis not present

## 2022-09-17 DIAGNOSIS — I351 Nonrheumatic aortic (valve) insufficiency: Secondary | ICD-10-CM | POA: Insufficient documentation

## 2022-09-17 DIAGNOSIS — E785 Hyperlipidemia, unspecified: Secondary | ICD-10-CM | POA: Insufficient documentation

## 2022-09-17 LAB — ECHOCARDIOGRAM COMPLETE
Area-P 1/2: 2.42 cm2
S' Lateral: 2.5 cm

## 2022-09-19 ENCOUNTER — Ambulatory Visit (INDEPENDENT_AMBULATORY_CARE_PROVIDER_SITE_OTHER): Payer: Medicare Other | Admitting: Family Medicine

## 2022-09-19 ENCOUNTER — Encounter: Payer: Self-pay | Admitting: *Deleted

## 2022-09-19 DIAGNOSIS — Z0000 Encounter for general adult medical examination without abnormal findings: Secondary | ICD-10-CM | POA: Diagnosis not present

## 2022-09-19 NOTE — Progress Notes (Signed)
MEDICARE ANNUAL WELLNESS VISIT  09/19/2022  Telephone Visit Disclaimer This Medicare AWV was conducted by telephone due to national recommendations for restrictions regarding the COVID-19 Pandemic (e.g. social distancing).  I verified, using two identifiers, that I am speaking with Jillian Huynh or their authorized healthcare agent. I discussed the limitations, risks, security, and privacy concerns of performing an evaluation and management service by telephone and the potential availability of an in-person appointment in the future. The patient expressed understanding and agreed to proceed.  Location of Patient: Home Location of Provider (nurse):  In the office.  Subjective:    Jillian Huynh is a 76 y.o. female patient of Metheney, Rene Kocher, MD who had a Medicare Annual Wellness Visit today via telephone. Jillian Huynh is Working part time and lives with their spouse. she has 3 children. she reports that she is socially active and does interact with friends/family regularly. she is not physically active and enjoys watching television, listening to music, working on puzzles and reading.  Patient Care Team: [name], MD as PCP - General (Family Medicine) Sueanne Margarita, MD as PCP - Sleep Medicine (Cardiology) Darius Bump, [facility] as Pharmacist (Pharmacist) Jimmie Molly, OD as Referring Physician (Optometry)     09/19/2022    1:18 PM 08/21/2022    8:38 PM 07/22/2021   10:22 AM 01/30/2021    2:12 PM 12/26/2016   10:08 AM 03/28/2015   10:05 AM  Advanced Directives  Does Patient Have a Medical Advance Directive? Yes Yes Yes Yes Yes Yes  Type of Advance Directive Living will Living will Healthcare Power of [place];Living will [place];Living will Living will  Does patient want to make changes to medical advance directive? No - Patient declined No - Patient declined  No - Patient declined No - Patient declined No - Patient declined   Copy of [place] in Chart?   No - copy requested No - copy requested No - copy requested No - copy requested  Would patient like information on creating a medical advance directive? No - Patient declined         Hospital Utilization Over the Past 12 Months: # of hospitalizations or ER visits: 0 # of surgeries: 0  Review of Systems    Patient reports that her overall health is unchanged compared to last year.  History obtained from chart review and the patient  Patient Reported Readings (BP, Pulse, CBG, Weight, etc) none  Pain Assessment Pain : No/denies pain     Current Medications & Allergies (verified) Allergies as of 09/19/2022       Reactions   Bydureon [exenatide] Nausea And Vomiting   Glipizide Other (See Comments)   Leg cramps, fatigue, and weight gain   Trulicity [dulaglutide] Diarrhea   Invokana [canagliflozin] Other (See Comments)   Yeast infection   Victoza [liraglutide] Nausea Only, Other (See Comments)   Constipation,belching,bloating        Medication List        Accurate as of September 19, 2022  1:26 PM. If you have any questions, ask your nurse or doctor.          atorvastatin 40 MG tablet Commonly known as: LIPITOR Take 1 tablet (40 mg total) by mouth daily.   B-D ULTRAFINE III SHORT PEN 31G X 8 MM Misc Generic drug: Insulin Pen Needle SUB-Q Daily   CALCIUM 1200 PO Take 1 tablet by mouth in the morning and at  bedtime.   dapagliflozin propanediol 10 MG Tabs tablet Commonly known as: Farxiga Take 1 tablet (10 mg total) by mouth daily before breakfast.   Entresto 97-103 MG Generic drug: sacubitril-valsartan Take 1 tablet by mouth 2 (two) times daily.   ezetimibe 10 MG tablet Commonly known as: ZETIA Take 1 tablet (10 mg total) by mouth daily.   metFORMIN 500 MG tablet Commonly known as: GLUCOPHAGE Take 1 tablet (500 mg total) by mouth 2 (two) times daily with a meal.   metoprolol succinate 50 MG 24 hr  tablet Commonly known as: Toprol XL Take 1 tablet (50 mg total) by mouth daily.   omeprazole 20 MG capsule Commonly known as: PRILOSEC Take 20 mg by mouth daily.   OneTouch Verio test strip Generic drug: glucose blood DX DM E11.65. CHECK FASTING BLOOD SUGAR EVERY MORNING AND 2 HOURS AFTER LARGE MEALS DAILY. CHECK 3 TIMES DAILY   rOPINIRole 4 MG tablet Commonly known as: REQUIP Take 1 tablet (4 mg total) by mouth daily.   Rybelsus 14 MG Tabs Generic drug: Semaglutide Take 1 tablet by mouth daily.   spironolactone 25 MG tablet Commonly known as: ALDACTONE Take 1 tablet (25 mg total) by mouth daily.   TRESIBA [place] Inject 50 Units into the skin daily.        History (reviewed): Past Medical History:  Diagnosis Date   Diabetes mellitus without complication ([place])    Hyperlipidemia    Hypertension    Past Surgical History:  Procedure Laterality Date   CHOLECYSTECTOMY     Family History  Problem Relation Age of Onset   Diabetes Father    Cancer Father 100       Esophogeal Ca., Lung.   Hypertension Other    Thyroid disease Other    Colon cancer Daughter 40   Glaucoma Mother    Hepatitis C Mother 53       Died from.   Glaucoma Sister    Glaucoma Sister    Glaucoma Maternal Aunt    Social History   Socioeconomic History   Marital status: Married    Spouse name: Biochemist, clinical   Number of children: 3   Years of education: 16.5   Highest education level: Some college, no degree  Occupational History   Occupation: Probation officer    Comment: part-time  Tobacco Use   Smoking status: Never   Smokeless tobacco: Never  Substance and Sexual Activity   Alcohol use: No    Alcohol/week: 0.0 standard drinks of alcohol   Drug use: No   Sexual activity: Yes    Partners: Male  Other Topics Concern   Not on file  Social History Narrative   Lives with her husband. She is working full time as a Probation officer. Two of her children live close by. One is in [place]. One of her  daughter's is deceased. Her hobbies include watch tv, listening to music and reading.    Social Determinants of Health   Financial Resource Strain: Low Risk  (09/19/2022)   Overall Financial Resource Strain (CARDIA)    Difficulty of Paying Living Expenses: Not hard at all  Food Insecurity: No Food Insecurity (09/19/2022)   Hunger Vital Sign    Worried About Running Out of Food in the Last Year: Never true    Ran Out of Food in the Last Year: Never true  Transportation Needs: No Transportation Needs (09/19/2022)   PRAPARE - Hydrologist (Medical): No  Lack of Transportation (Non-Medical): No  Physical Activity: Inactive (09/19/2022)   Exercise Vital Sign    Days of Exercise per Week: 0 days    Minutes of Exercise per Session: 0 min  Stress: No Stress Concern Present (07/22/2021)   [place]    Feeling of Stress : Not at all  Social Connections: Moderately Integrated (09/19/2022)   Social Connection and Isolation Panel [NHANES]    Frequency of Communication with Friends and Family: More than three times a week    Frequency of Social Gatherings with Friends and Family: More than three times a week    Attends Religious Services: Never    Marine scientist or Organizations: Yes    Attends Music therapist: More than 4 times per year    Marital Status: Married    Activities of Daily Living    09/19/2022    1:18 PM  In your present state of health, do you have any difficulty performing the following activities:  Hearing? 1  Comment wears bilateral hearing aids.  Vision? 0  Difficulty concentrating or making decisions? 0  Walking or climbing stairs? 1  Comment due to weakness.  Dressing or bathing? 0  Doing errands, shopping? 0  Preparing Food and eating ? N  Using the Toilet? N  In the past six months, have you accidently leaked urine? Y  Comment sometimes.  Do you  have problems with loss of bowel control? Y  Comment one accident.  Managing your Medications? N  Managing your Finances? N  Housekeeping or managing your Housekeeping? N    Patient Education/ Literacy How often do you need to have someone help you when you read instructions, pamphlets, or other written materials from your doctor or pharmacy?: 1 - Never What is the last grade level you completed in school?: 3 years of college  Exercise Current Exercise Habits: The patient does not participate in regular exercise at present, Exercise limited by: cardiac condition(s)  Diet Patient reports consuming 3 meals a day and 3 snack(s) a day Patient reports that her primary diet is: Regular Patient reports that she does have regular access to food.   Depression Screen    09/19/2022    1:06 PM 02/19/2022   10:25 AM 07/22/2021   10:19 AM 05/08/2021    1:36 PM 01/30/2021    2:08 PM 09/19/2020   10:06 AM 08/25/2019   11:14 AM  PHQ 2/9 Scores  PHQ - 2 Score 0 0 0 0 0 0 0     Fall Risk    09/19/2022    1:06 PM 02/19/2022   10:24 AM 07/22/2021   10:23 AM 05/08/2021    1:36 PM 01/30/2021    2:08 PM  [place] in the past year? 0 0 0 0 0  Number falls in past yr: 0 0 0 0 0  Injury with Fall? 0 0 0 0 0  Risk for fall due to : No Fall Risks No Fall Risks  No Fall Risks No Fall Risks  Follow up Falls evaluation completed Falls prevention discussed;Falls evaluation completed Falls prevention discussed Falls evaluation completed;Falls prevention discussed Falls evaluation completed     Objective:  Jillian Huynh seemed alert and oriented and she participated appropriately during our telephone visit.  Blood Pressure Weight BMI  BP Readings from Last 3 Encounters:  09/12/22 (!) 94/53  08/27/22 (!) 96/55  08/15/22 104/72   Wt Readings  from Last 3 Encounters:  09/12/22 193 lb 6.4 oz (87.7 kg)  08/27/22 188 lb (85.3 kg)  08/21/22 190 lb (86.2 kg)   BMI Readings from Last 1 Encounters:   09/12/22 35.37 kg/m    *Unable to obtain current vital signs, weight, and BMI due to telephone visit type  Hearing/Vision  Jillian Huynh did not seem to have difficulty with hearing/understanding during the telephone conversation Reports that she has had a formal eye exam by an eye care professional within the past year Reports that she has not had a formal hearing evaluation within the past year *Unable to fully assess hearing and vision during telephone visit type  Cognitive Function:    09/19/2022    1:21 PM 07/22/2021   10:26 AM 01/30/2021    2:20 PM 12/26/2016   10:08 AM  6CIT Screen  What Year? 0 points 0 points 0 points 0 points  What month? 0 points 0 points 0 points 0 points  What time? 0 points 0 points 0 points 0 points  Count back from 20 2 points 0 points 2 points 0 points  Months in reverse 0 points 4 points 0 points 0 points  Repeat phrase 2 points 0 points 2 points 0 points  Total Score 4 points 4 points 4 points 0 points   (Normal:0-7, Significant for Dysfunction: >8)  Normal Cognitive Function Screening: Yes   Immunization & Health Maintenance Record Immunization History  Administered Date(s) Administered   Moderna Sars-Covid-2 Vaccination 01/06/2020, 02/12/2020   Pneumococcal Conjugate-13 12/25/2017   Tdap 03/26/2007, 06/29/2015    Health Maintenance  Topic Date Due   OPHTHALMOLOGY EXAM  09/19/2022 (Originally 08/23/2022)   FOOT EXAM  09/20/2022 (Originally 08/07/2022)   COVID-19 Vaccine (3 - Moderna series) 10/05/2022 (Originally 04/08/2020)   Zoster Vaccines- Shingrix (1 of 2) 12/20/2022 (Originally 11/05/1996)   INFLUENZA VACCINE  02/10/2023 (Originally 06/12/2022)   Pneumonia Vaccine 60+ Years old (2 - PPSV23 or PCV20) 09/20/2023 (Originally 12/25/2018)   Diabetic kidney evaluation - Urine ACR  09/20/2027 (Originally 03/25/2018)   COLONOSCOPY (Pts 45-4yr Insurance coverage will need to be confirmed)  12/29/2022   HEMOGLOBIN A1C  02/26/2023   Diabetic kidney  evaluation - GFR measurement  08/16/2023   Medicare Annual Wellness (AWV)  09/20/2023   TETANUS/TDAP  06/28/2025   DEXA SCAN  Completed   Hepatitis C Screening  Completed   HPV VACCINES  Aged Out       Assessment  This is a routine wellness examination for Jillian Huynh  Health Maintenance: Due or Overdue There are no preventive care reminders to display for this patient.   NRosalita Chessmandoes not need a referral for Community Assistance: Care Management:   no Social Work:    no Prescription Assistance:  no Nutrition/Diabetes Education:  no   Plan:  Personalized Goals  Goals Addressed               This Visit's Progress     Patient Stated (pt-stated)        Patient would like to have more energy. She is looking forward to starting silver sneakers in January.       Personalized Health Maintenance & Screening Recommendations  Foot exam Urine ACR Eye exam- Patient completed it but will have records faxed over. Shingles vaccine Pneumonia vaccine Influenza vaccine  Patient declined the vaccines.  Lung Cancer Screening Recommended: no (Low Dose CT Chest recommended if Age 438-80years, 30 pack-year currently smoking OR have quit w/in past 15 years)  Hepatitis C Screening recommended: no HIV Screening recommended: no  Advanced Directives: Written information was not prepared per patient's request.  Referrals & Orders No orders of the defined types were placed in this encounter.   Follow-up Plan Follow-up with [name], MD as planned Eye exam- Patient completed it but will have records faxed over. Foot exam & Urine ACR can be completed at the next visit. Medicare wellness visit in one year.  AVS printed and mailed to the patient.   I have personally reviewed and noted the following in the patient's chart:   Medical and social history Use of alcohol, tobacco or illicit drugs  Current medications and supplements Functional ability and  status Nutritional status Physical activity Advanced directives List of other physicians Hospitalizations, surgeries, and ER visits in previous 12 months Vitals Screenings to include cognitive, depression, and falls Referrals and appointments  In addition, I have reviewed and discussed with Jillian Huynh certain preventive protocols, quality metrics, and best practice recommendations. A written personalized care plan for preventive services as well as general preventive health recommendations is available and can be mailed to the patient at her request.      Tinnie Gens, RN BSN  09/19/2022

## 2022-09-19 NOTE — Patient Instructions (Addendum)
[facility] Maintenance Summary and Written Plan of Care  [name] ,  Thank you for allowing me to perform your Medicare Annual Wellness Visit and for your ongoing commitment to your health.   Health Maintenance & Immunization History Health Maintenance  Topic Date Due   OPHTHALMOLOGY EXAM  09/19/2022 (Originally 08/23/2022)   FOOT EXAM  09/20/2022 (Originally 08/07/2022)   COVID-19 Vaccine (3 - Moderna series) 10/05/2022 (Originally 04/08/2020)   Zoster Vaccines- Shingrix (1 of 2) 12/20/2022 (Originally 11/05/1996)   INFLUENZA VACCINE  02/10/2023 (Originally 06/12/2022)   Pneumonia Vaccine 39+ Years old (2 - PPSV23 or PCV20) 09/20/2023 (Originally 12/25/2018)   Diabetic kidney evaluation - Urine ACR  09/20/2027 (Originally 03/25/2018)   COLONOSCOPY (Pts 45-64yr Insurance coverage will need to be confirmed)  12/29/2022   HEMOGLOBIN A1C  02/26/2023   Diabetic kidney evaluation - GFR measurement  08/16/2023   Medicare Annual Wellness (AWV)  09/20/2023   TETANUS/TDAP  06/28/2025   DEXA SCAN  Completed   Hepatitis C Screening  Completed   HPV VACCINES  Aged Out   Immunization History  Administered Date(s) Administered   Moderna Sars-Covid-2 Vaccination 01/06/2020, 02/12/2020   Pneumococcal Conjugate-13 12/25/2017   Tdap 03/26/2007, 06/29/2015    These are the patient goals that we discussed:  Goals Addressed               This Visit's Progress     Patient Stated (pt-stated)        Patient would like to have more energy. She is looking forward to starting silver sneakers in January.         This is a list of Health Maintenance Items that are overdue or due now: Foot exam Urine ACR Eye exam- Patient completed it but will have records faxed over. Shingles vaccine Pneumonia vaccine Influenza vaccine   Patient declined the vaccines.  Orders/Referrals Placed Today: No orders of the defined types were placed in this encounter.  (Contact our  referral department at [phone] you have not spoken with someone about your referral appointment within the next 5 days)    Follow-up Plan Follow-up with MHali Marry MD as planned Eye exam- Patient completed it but will have records faxed over. Foot exam & Urine ACR can be completed at the next visit. Medicare wellness visit in one year.  AVS printed and mailed to the patient.      Health Maintenance, Female Adopting a healthy lifestyle and getting preventive care are important in promoting health and wellness. Ask your health care provider about: The right schedule for you to have regular tests and exams. Things you can do on your own to prevent diseases and keep yourself healthy. What should I know about diet, weight, and exercise? Eat a healthy diet  Eat a diet that includes plenty of vegetables, fruits, low-fat dairy products, and lean protein. Do not eat a lot of foods that are high in solid fats, added sugars, or sodium. Maintain a healthy weight Body mass index (BMI) is used to identify weight problems. It estimates body fat based on height and weight. Your health care provider can help determine your BMI and help you achieve or maintain a healthy weight. Get regular exercise Get regular exercise. This is one of the most important things you can do for your health. Most adults should: Exercise for at least 150 minutes each week. The exercise should increase your heart rate and make you sweat (moderate-intensity exercise). Do strengthening exercises at  least twice a week. This is in addition to the moderate-intensity exercise. Spend less time sitting. Even light physical activity can be beneficial. Watch cholesterol and blood lipids Have your blood tested for lipids and cholesterol at 76 years of age, then have this test every 5 years. Have your cholesterol levels checked more often if: Your lipid or cholesterol levels are high. You are older than 76 years of  age. You are at high risk for heart disease. What should I know about cancer screening? Depending on your health history and family history, you may need to have cancer screening at various ages. This may include screening for: Breast cancer. Cervical cancer. Colorectal cancer. Skin cancer. Lung cancer. What should I know about heart disease, diabetes, and high blood pressure? Blood pressure and heart disease High blood pressure causes heart disease and increases the risk of stroke. This is more likely to develop in people who have high blood pressure readings or are overweight. Have your blood pressure checked: Every 3-5 years if you are 77-58 years of age. Every year if you are 66 years old or older. Diabetes Have regular diabetes screenings. This checks your fasting blood sugar level. Have the screening done: Once every three years after age 74 if you are at a normal weight and have a low risk for diabetes. More often and at a younger age if you are overweight or have a high risk for diabetes. What should I know about preventing infection? Hepatitis B If you have a higher risk for hepatitis B, you should be screened for this virus. Talk with your health care provider to find out if you are at risk for hepatitis B infection. Hepatitis C Testing is recommended for: Everyone born from [birth year] through 1965. Anyone with known risk factors for hepatitis C. Sexually transmitted infections (STIs) Get screened for STIs, including gonorrhea and chlamydia, if: You are sexually active and are younger than 76 years of age. You are older than 77 years of age and your health care provider tells you that you are at risk for this type of infection. Your sexual activity has changed since you were last screened, and you are at increased risk for chlamydia or gonorrhea. Ask your health care provider if you are at risk. Ask your health care provider about whether you are at high risk for HIV. Your health  care provider may recommend a prescription medicine to help prevent HIV infection. If you choose to take medicine to prevent HIV, you should first get tested for HIV. You should then be tested every 3 months for as long as you are taking the medicine. Pregnancy If you are about to stop having your period (premenopausal) and you may become pregnant, seek counseling before you get pregnant. Take 400 to 800 micrograms (mcg) of folic acid every day if you become pregnant. Ask for birth control (contraception) if you want to prevent pregnancy. Osteoporosis and menopause Osteoporosis is a disease in which the bones lose minerals and strength with aging. This can result in bone fractures. If you are 6 years old or older, or if you are at risk for osteoporosis and fractures, ask your health care provider if you should: Be screened for bone loss. Take a calcium or vitamin D supplement to lower your risk of fractures. Be given hormone replacement therapy (HRT) to treat symptoms of menopause. Follow these instructions at home: Alcohol use Do not drink alcohol if: Your health care provider tells you not to drink. You are  pregnant, may be pregnant, or are planning to become pregnant. If you drink alcohol: Limit how much you have to: 0-1 drink a day. Know how much alcohol is in your drink. In the [place] one drink equals one 12 oz bottle of beer (355 mL), one 5 oz glass of wine (148 mL), or one 1 oz glass of hard liquor (44 mL). Lifestyle Do not use any products that contain nicotine or tobacco. These products include cigarettes, chewing tobacco, and vaping devices, such as e-cigarettes. If you need help quitting, ask your health care provider. Do not use street drugs. Do not share needles. Ask your health care provider for help if you need support or information about quitting drugs. General instructions Schedule regular health, dental, and eye exams. Stay current with your vaccines. Tell your health  care provider if: You often feel depressed. You have ever been abused or do not feel safe at home. Summary Adopting a healthy lifestyle and getting preventive care are important in promoting health and wellness. Follow your health care provider's instructions about healthy diet, exercising, and getting tested or screened for diseases. Follow your health care provider's instructions on monitoring your cholesterol and blood pressure. This information is not intended to replace advice given to you by your health care provider. Make sure you discuss any questions you have with your health care provider. Document Revised: 03/20/2021 Document Reviewed: 03/20/2021 Elsevier Patient Education  [place].

## 2022-09-28 ENCOUNTER — Telehealth: Payer: Self-pay | Admitting: Family Medicine

## 2022-09-28 NOTE — Telephone Encounter (Signed)
Received 2 boxes of Rybelsus on 11/17 at 10:25 am I have called patient and let her know medication is available for pickup - Cf

## 2022-10-05 DIAGNOSIS — J209 Acute bronchitis, unspecified: Secondary | ICD-10-CM | POA: Diagnosis not present

## 2022-10-09 ENCOUNTER — Other Ambulatory Visit: Payer: Self-pay | Admitting: Cardiology

## 2022-10-09 DIAGNOSIS — E7800 Pure hypercholesterolemia, unspecified: Secondary | ICD-10-CM

## 2022-10-11 ENCOUNTER — Other Ambulatory Visit: Payer: Self-pay | Admitting: Family Medicine

## 2022-11-18 ENCOUNTER — Other Ambulatory Visit: Payer: Self-pay | Admitting: Cardiology

## 2022-11-18 DIAGNOSIS — E7800 Pure hypercholesterolemia, unspecified: Secondary | ICD-10-CM

## 2022-11-19 IMAGING — CT CT HEART MORP W/ CTA COR W/ SCORE W/ CA W/CM &/OR W/O CM
4 of 7 series · 8 of 20 positions shown, 9 images · non-contrast
Comparison: None.
COMPARISON: None.

Addendum:
EXAM:
OVER-READ INTERPRETATION  CT CHEST

The following report is an over-read performed by radiologist Dr.
Eventon Ubisi [REDACTED] on 10/02/2021. This
over-read does not include interpretation of cardiac or coronary
anatomy or pathology. The coronary calcium score/coronary CTA
interpretation by the cardiologist is attached.
CLINICAL DATA: Chest pain
Cardiac/Coronary CTA
TECHNIQUE: A non-contrast, gated CT scan was obtained with axial slices of 3 mm
through the heart for calcium scoring. Calcium scoring was performed
using the Agatston method. A 100 kV prospective, gated, contrast
cardiac scan was obtained. Gantry rotation speed was 250 msecs and
collimation was 0.6 mm. Two sublingual nitroglycerin tablets (0.8
mg) were given. The 3D data set was reconstructed in 5% intervals of
the 35-75% of the R-R cycle. Diastolic phases were analyzed on a
dedicated workstation using MPR, MIP, and VRT modes. The patient
received 95 cc of contrast.

[Series 8: ts diast sharp · axial · 0.39mm/px · z∈[-214,-176]mm · 2 of 286 slices shown]
[im 96/286  lung]
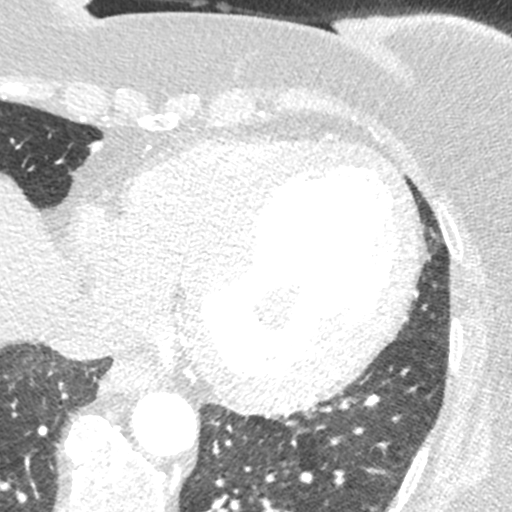
[im 191/286  lung]
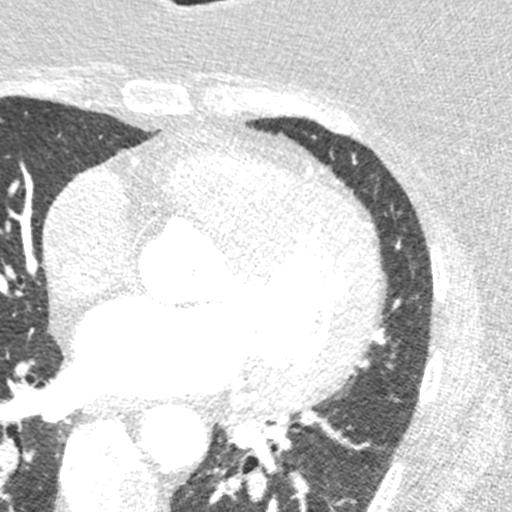

[Series 9: ts syst sharp · axial · 0.39mm/px · z∈[-214,-176]mm · 2 of 286 slices shown]
[im 96/286  lung]
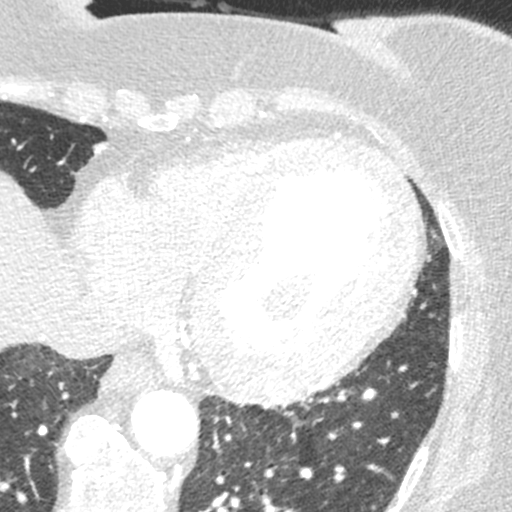
[im 191/286  lung]
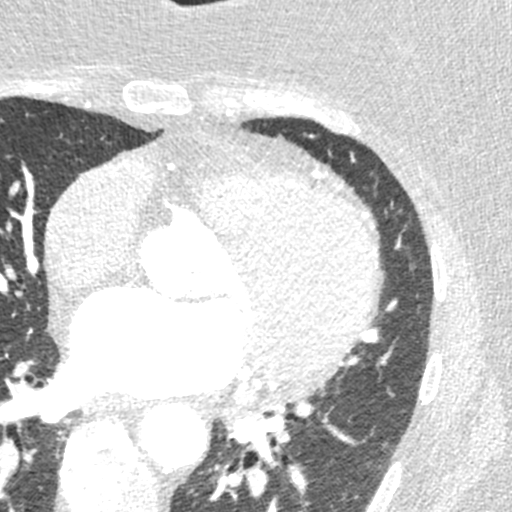

[Series 10: best diast · axial · 0.39mm/px · z∈[-214,-176]mm · 2 of 286 slices shown, 3 images]
[im 96/286  vessel]
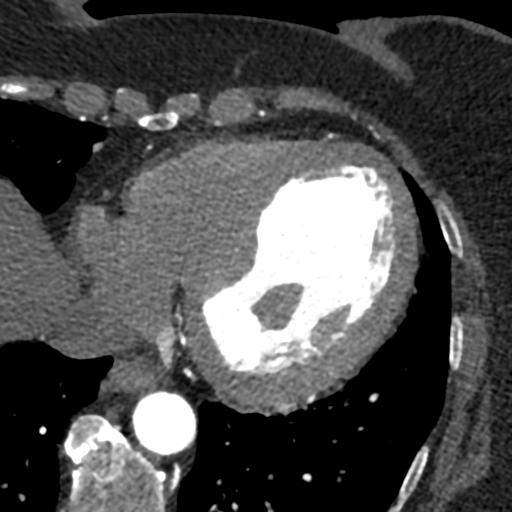
[im 96/286  lung]
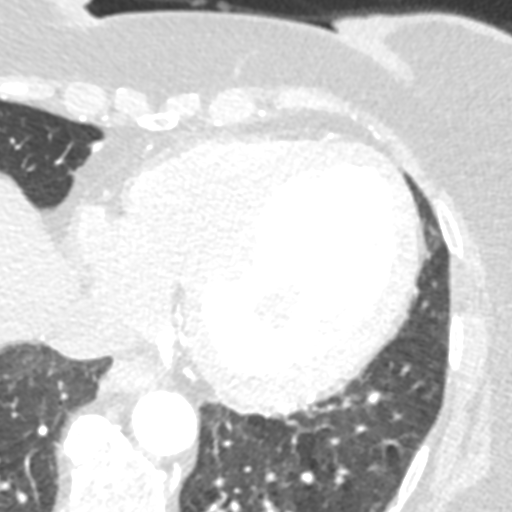
[im 191/286  vessel]
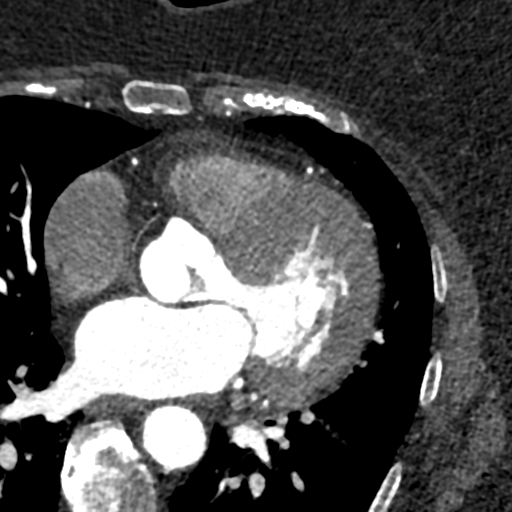

[Series 11: best syst · axial · 0.39mm/px · z∈[-214,-176]mm · 2 of 286 slices shown]
[im 96/286  vessel]
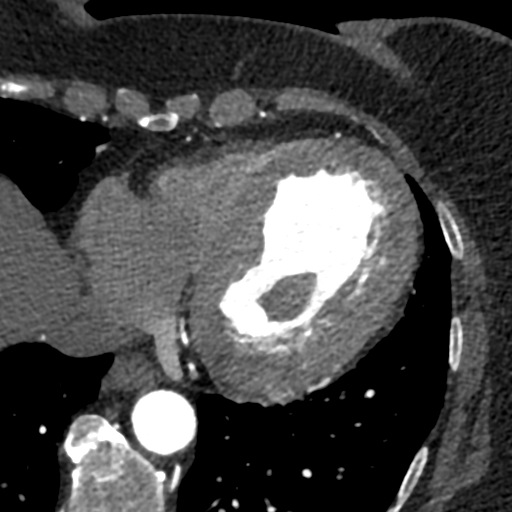
[im 191/286  vessel]
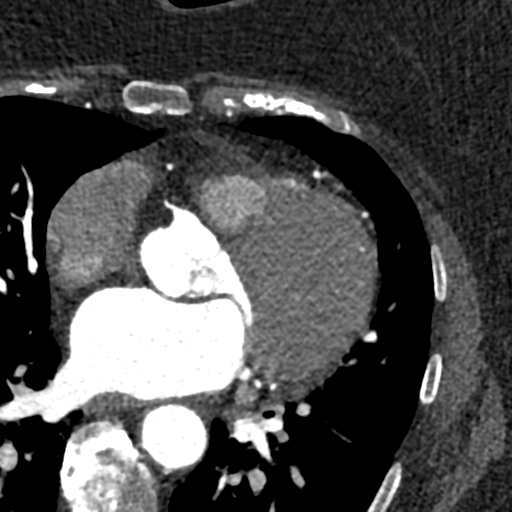

[8 of 20 positions shown; findings below may reference images not displayed]

FINDINGS: Atherosclerotic calcifications in the thoracic aorta. Within the
visualized portions of the thorax there are no suspicious appearing
pulmonary nodules or masses, there is no acute consolidative
airspace disease, no pleural effusions, no pneumothorax and no
lymphadenopathy. Visualized portions of the upper abdomen are
unremarkable. There are no aggressive appearing lytic or blastic
lesions noted in the visualized portions of the skeleton.
IMPRESSION: Aortic Atherosclerosis (FQ3C1-NC2.2).
FINDINGS: Image quality: Excellent.

Noise artifact is: Limited.

Coronary Arteries:  Normal coronary origin.  Left dominance.

Left main: The left main is a large caliber vessel with a normal
take off from the left coronary cusp that bifurcates to form a left
anterior descending artery and a left circumflex artery. There is no
plaque or stenosis.

Left anterior descending artery: There is minimal calcified plaque
(<25%) in the proximal LAD. The mid and distal segments are patent.
The LAD gives off 2 patent diagonal branches.

Left circumflex artery: The LCX is dominant. There is minimal
calcified plaque (<25%) in the mid and distal segments. The LCX
gives off 3 patent obtuse marginal branches. The LCX terminates as a
patent PDA.

Right coronary artery: The RCA is non-dominant with normal take off
from the right coronary cusp. There is no evidence of plaque or
stenosis.

Right Atrium: Right atrial size is within normal limits.

Right Ventricle: The right ventricular cavity is within normal
limits.

Left Atrium: Left atrial size is normal in size with no left atrial
appendage filling defect.

Left Ventricle: The ventricular cavity size is within normal limits.
There are no stigmata of prior infarction. There is no abnormal
filling defect.

Pulmonary arteries: Normal in size without proximal filling defect.

Pulmonary veins: Normal pulmonary venous drainage.

Pericardium: Normal thickness with no significant effusion or
calcium present.

Cardiac valves: The aortic valve is trileaflet without significant
calcification. The mitral valve is normal structure without
significant calcification.

Aorta: Normal caliber with no significant disease.

Extra-cardiac findings: See attached radiology report for
non-cardiac structures.
IMPRESSION: 1. Coronary calcium score of 162. This was 70th percentile for age-,
sex, and race-matched controls.

2. Normal coronary origin with left dominance.

3. Minimal calcified plaque (<25%) in the LAD/LCX.

RECOMMENDATIONS:
1. Minimal non-obstructive CAD (0-24%). Consider non-atherosclerotic
causes of chest pain. Consider preventive therapy and risk factor
modification.

*** End of Addendum ***
EXAM:
OVER-READ INTERPRETATION  CT CHEST

The following report is an over-read performed by radiologist Dr.
Eventon Ubisi [REDACTED] on 10/02/2021. This
over-read does not include interpretation of cardiac or coronary
anatomy or pathology. The coronary calcium score/coronary CTA
interpretation by the cardiologist is attached.
FINDINGS: Atherosclerotic calcifications in the thoracic aorta. Within the
visualized portions of the thorax there are no suspicious appearing
pulmonary nodules or masses, there is no acute consolidative
airspace disease, no pleural effusions, no pneumothorax and no
lymphadenopathy. Visualized portions of the upper abdomen are
unremarkable. There are no aggressive appearing lytic or blastic
lesions noted in the visualized portions of the skeleton.
IMPRESSION: Aortic Atherosclerosis (FQ3C1-NC2.2).

## 2022-11-24 DIAGNOSIS — G4733 Obstructive sleep apnea (adult) (pediatric): Secondary | ICD-10-CM | POA: Diagnosis not present

## 2022-11-25 NOTE — Progress Notes (Unsigned)
This encounter was created in error - please disregard.

## 2022-11-26 ENCOUNTER — Encounter: Payer: Self-pay | Admitting: Cardiology

## 2022-11-26 ENCOUNTER — Ambulatory Visit: Payer: Medicare HMO | Admitting: Cardiology

## 2022-11-26 VITALS — Wt 190.0 lb

## 2022-11-26 DIAGNOSIS — G4733 Obstructive sleep apnea (adult) (pediatric): Secondary | ICD-10-CM

## 2022-11-26 DIAGNOSIS — I10 Essential (primary) hypertension: Secondary | ICD-10-CM

## 2022-11-28 ENCOUNTER — Ambulatory Visit (INDEPENDENT_AMBULATORY_CARE_PROVIDER_SITE_OTHER): Payer: Medicare HMO | Admitting: Family Medicine

## 2022-11-28 ENCOUNTER — Encounter: Payer: Self-pay | Admitting: Family Medicine

## 2022-11-28 VITALS — BP 113/58 | HR 64 | Ht 62.0 in | Wt 193.0 lb

## 2022-11-28 DIAGNOSIS — Z974 Presence of external hearing-aid: Secondary | ICD-10-CM | POA: Diagnosis not present

## 2022-11-28 DIAGNOSIS — E118 Type 2 diabetes mellitus with unspecified complications: Secondary | ICD-10-CM | POA: Diagnosis not present

## 2022-11-28 DIAGNOSIS — I5022 Chronic systolic (congestive) heart failure: Secondary | ICD-10-CM | POA: Diagnosis not present

## 2022-11-28 DIAGNOSIS — R5383 Other fatigue: Secondary | ICD-10-CM | POA: Diagnosis not present

## 2022-11-28 DIAGNOSIS — E785 Hyperlipidemia, unspecified: Secondary | ICD-10-CM | POA: Diagnosis not present

## 2022-11-28 LAB — POCT GLYCOSYLATED HEMOGLOBIN (HGB A1C): Hemoglobin A1C: 10 % — AB (ref 4.0–5.6)

## 2022-11-28 NOTE — Progress Notes (Signed)
Established Patient Office Visit  Subjective   Patient ID: Jillian Huynh, female    DOB: [birth year]  Age: 77 y.o. MRN: [id number]  Chief Complaint  Patient presents with   Diabetes    HPI Diabetes - no hypoglycemic events. No wounds or sores that are not healing well. No increased thirst or urination. Checking glucose at home. Taking medications as prescribed without any side effects.she is still battling her sugar addiction. She has really had a hard time cutting out bread.  She did get notification in the mail that they will no longer cover Levemir.  She still has a couple months worth at home but it has been discontinued.  Says she discontinued the metformin and did not want to take it anymore.  She is just really struggling with feeling really fatigued and having low energy.  She is going to discuss it with the cardiologist but wanted to let me know as well.  She does have hearing aids now.  Obstructive sleep apnea-she did start wearing her CPAP but usually ends up turning it off in the middle the night but then has to come pick up and put it back on to get her full 4 hours then but she has been trying to stick with it.    ROS    Objective:     BP (!) 113/58   Pulse 64   Ht '5\' 2"'$  (1.575 m)   Wt 193 lb (87.5 kg)   SpO2 99%   BMI 35.30 kg/m    Physical Exam Vitals and nursing note reviewed.  Constitutional:      Appearance: She is well-developed.  HENT:     Head: Normocephalic and atraumatic.  Cardiovascular:     Rate and Rhythm: Normal rate and regular rhythm.     Heart sounds: Normal heart sounds.  Pulmonary:     Effort: Pulmonary effort is normal.     Breath sounds: Normal breath sounds.  Skin:    General: Skin is warm and dry.  Neurological:     Mental Status: She is alert and oriented to person, place, and time.  Psychiatric:        Behavior: Behavior normal.      Results for orders placed or performed in visit on 11/28/22  POCT glycosylated  hemoglobin (Hb A1C)  Result Value Ref Range   Hemoglobin A1C 10.0 (A) 4.0 - 5.6 %   HbA1c POC (<> result, manual entry)     HbA1c, POC (prediabetic range)     HbA1c, POC (controlled diabetic range)        The ASCVD Risk score (Arnett DK, et al., 2019) failed to calculate for the following reasons:   The valid total cholesterol range is 130 to 320 mg/dL    Assessment & Plan:   Problem List Items Addressed This Visit       Cardiovascular and Mediastinum   Systolic CHF ([place])    Doing well on Entresto.  Follow-up with cardiology coming up soon.        Endocrine   Diabetes mellitus type 2, controlled ([place]) - Primary    Unfortunately A1c went up significantly to 10.0.  We discussed getting back on track with healthier food choices.  In setting to calls.  1 is to really cut back on her bread intake which she says is her weakness.  And also to get rid of the sweetened packets that she puts in her water.  We did discuss that artificial sugar has been  shown to increase weight.  She still taking her Wilder Glade and the Rybelsus.  But says she just does not feel well.  Increase Levemir to 60 units daily.  If blood sugars are coming back down under 150 then she can drop back down to 55 units.      Relevant Orders   POCT glycosylated hemoglobin (Hb A1C) (Completed)   CBC with Differential/Platelet   COMPLETE METABOLIC PANEL WITH GFR   TSH   Urine Microalbumin w/creat. ratio     Other   Hearing aid worn   Dyslipidemia    Continue Lipitor and Zetia.      Other Visit Diagnoses     Other fatigue       Relevant Orders   CBC with Differential/Platelet   COMPLETE METABOLIC PANEL WITH GFR   TSH   Urine Microalbumin w/creat. ratio       Return in about 3 months (around 02/27/2023) for Diabetes follow-up.    Beatrice Lecher, MD

## 2022-11-28 NOTE — Assessment & Plan Note (Signed)
Continue Lipitor and Zetia 

## 2022-11-28 NOTE — Patient Instructions (Signed)
2 goals: Cut back on bread intake significantly.

## 2022-11-28 NOTE — Assessment & Plan Note (Addendum)
Unfortunately A1c went up significantly to 10.0.  We discussed getting back on track with healthier food choices.  In setting to calls.  1 is to really cut back on her bread intake which she says is her weakness.  And also to get rid of the sweetened packets that she puts in her water.  We did discuss that artificial sugar has been shown to increase weight.  She still taking her Wilder Glade and the Rybelsus.  But says she just does not feel well.  Increase Levemir to 60 units daily.  If blood sugars are coming back down under 150 then she can drop back down to 55 units.

## 2022-11-28 NOTE — Assessment & Plan Note (Addendum)
Doing well on Entresto.  Follow-up with cardiology coming up soon.

## 2022-11-29 LAB — COMPLETE METABOLIC PANEL WITH GFR
AG Ratio: 2 (calc) (ref 1.0–2.5)
ALT: 15 U/L (ref 6–29)
AST: 13 U/L (ref 10–35)
Albumin: 4.4 g/dL (ref 3.6–5.1)
Alkaline phosphatase (APISO): 73 U/L (ref 37–153)
BUN: 20 mg/dL (ref 7–25)
CO2: 25 mmol/L (ref 20–32)
Calcium: 10.1 mg/dL (ref 8.6–10.4)
Chloride: 107 mmol/L (ref 98–110)
Creat: 0.61 mg/dL (ref 0.60–1.00)
Globulin: 2.2 g/dL (calc) (ref 1.9–3.7)
Glucose, Bld: 164 mg/dL — ABNORMAL HIGH (ref 65–99)
Potassium: 4.6 mmol/L (ref 3.5–5.3)
Sodium: 141 mmol/L (ref 135–146)
Total Bilirubin: 1 mg/dL (ref 0.2–1.2)
Total Protein: 6.6 g/dL (ref 6.1–8.1)
eGFR: 93 mL/min/{1.73_m2} (ref >=60)

## 2022-11-29 LAB — CBC WITH DIFFERENTIAL/PLATELET
Absolute Monocytes: 419 cells/uL (ref 200–950)
Basophils Absolute: 71 cells/uL (ref 0–200)
Basophils Relative: 1 %
Eosinophils Absolute: 199 cells/uL (ref 15–500)
Eosinophils Relative: 2.8 %
HCT: 39.9 % (ref 35.0–45.0)
Hemoglobin: 12.8 g/dL (ref 11.7–15.5)
Lymphs Abs: 2059 cells/uL (ref 850–3900)
MCH: 27.6 pg (ref 27.0–33.0)
MCHC: 32.1 g/dL (ref 32.0–36.0)
MCV: 86 fL (ref 80.0–100.0)
MPV: 11.4 fL (ref 7.5–12.5)
Monocytes Relative: 5.9 %
Neutro Abs: 4352 cells/uL (ref 1500–7800)
Neutrophils Relative %: 61.3 %
Platelets: 263 10*3/uL (ref 140–400)
RBC: 4.64 10*6/uL (ref 3.80–5.10)
RDW: 13.3 % (ref 11.0–15.0)
Total Lymphocyte: 29 %
WBC: 7.1 10*3/uL (ref 3.8–10.8)

## 2022-11-29 LAB — TSH: TSH: 2.71 mIU/L (ref 0.40–4.50)

## 2022-11-29 LAB — MICROALBUMIN / CREATININE URINE RATIO
Creatinine, Urine: 62 mg/dL (ref 20–275)
Microalb Creat Ratio: 13 mcg/mg creat (ref <30)
Microalb, Ur: 0.8 mg/dL

## 2022-11-29 NOTE — Progress Notes (Signed)
Your lab work is within acceptable range and there are no concerning findings.   ?

## 2022-12-05 ENCOUNTER — Other Ambulatory Visit: Payer: Self-pay | Admitting: Family Medicine

## 2022-12-05 DIAGNOSIS — E118 Type 2 diabetes mellitus with unspecified complications: Secondary | ICD-10-CM

## 2022-12-13 ENCOUNTER — Telehealth: Payer: Self-pay | Admitting: Neurology

## 2022-12-13 NOTE — Telephone Encounter (Signed)
Patient called and LVM stating a new supply company (she doesn't know the name) will be contacting us about getting blood sugar supplies. She okayed them to be sent to this [facility].

## 2022-12-17 NOTE — Progress Notes (Signed)
HPI: FU CHF. ABIs October 2022 normal. Echocardiogram November 2022 showed septal hypokinesis, ejection fraction 45 to 50%, moderate left ventricular hypertrophy, grade 1 diastolic dysfunction, moderate mitral regurgitation, mild aortic insufficiency. Cardiac CTA November 2022 showed calcium score 162 which was 70th percentile and minimal nonobstructive disease in the LAD and circumflex.  Patient was initiated on medical therapy.  Cardiac MRI August 2023 showed ejection fraction 49% with septal/lateral dyssynchrony consistent with left bundle branch block, normal RV function.  Echocardiogram repeated November 2023 and showed ejection fraction 50 to 55%, moderate left ventricular hypertrophy, grade 1 diastolic dysfunction, mild aortic insufficiency.  Since last seen, she is much improved.  She has dyspnea with more vigorous activities.  No orthopnea, PND, pedal edema, chest pain or syncope.  Her energy has improved.  Current Outpatient Medications  Medication Sig Dispense Refill   atorvastatin (LIPITOR) 40 MG tablet Take 1 tablet by mouth once daily 90 tablet 0   B-D ULTRAFINE III SHORT PEN 31G X 8 MM MISC SUB-Q Daily 100 each 4   Calcium Carbonate-Vit D-Min (CALCIUM 1200 PO) Take 1 tablet by mouth in the morning and at bedtime.     Continuous Blood Gluc Sensor (DEXCOM G7 SENSOR) MISC APPLY 1 SENSOR TO SKIN EVERY 10 DAYS 9 each 1   dapagliflozin propanediol (FARXIGA) 10 MG TABS tablet Take 1 tablet (10 mg total) by mouth daily before breakfast. 30 tablet 11   ezetimibe (ZETIA) 10 MG tablet Take 1 tablet by mouth once daily 90 tablet 3   Insulin Degludec (TRESIBA [place]) Inject 50 Units into the skin daily.     metoprolol succinate (TOPROL XL) 50 MG 24 hr tablet Take 1 tablet (50 mg total) by mouth daily. 90 tablet 3   omeprazole (PRILOSEC) 20 MG capsule Take 20 mg by mouth daily.     rOPINIRole (REQUIP) 4 MG tablet Take 1 tablet by mouth once daily 90 tablet 0   sacubitril-valsartan (ENTRESTO)  97-103 MG Take 1 tablet by mouth 2 (two) times daily. 180 tablet 3   Semaglutide (RYBELSUS) 14 MG TABS Take 1 tablet by mouth daily.     spironolactone (ALDACTONE) 25 MG tablet Take 1 tablet (25 mg total) by mouth daily. 90 tablet 3   No current facility-administered medications for this visit.     Past Medical History:  Diagnosis Date   Diabetes mellitus without complication ([place])    Hyperlipidemia    Hypertension     Past Surgical History:  Procedure Laterality Date   CHOLECYSTECTOMY      Social History   Socioeconomic History   Marital status: Married    Spouse name: Biochemist, clinical   Number of children: 3   Years of education: 16.5   Highest education level: Some college, no degree  Occupational History   Occupation: Probation officer    Comment: part-time  Tobacco Use   Smoking status: Never   Smokeless tobacco: Never  Substance and Sexual Activity   Alcohol use: No    Alcohol/week: 0.0 standard drinks of alcohol   Drug use: No   Sexual activity: Yes    Partners: Male  Other Topics Concern   Not on file  Social History Narrative   Lives with her husband. She is working full time as a Probation officer. Two of her children live close by. One is in [place]. One of her daughter's is deceased. Her hobbies include watch tv, listening to music and reading.    Social Determinants of Health  Financial Resource Strain: Low Risk  (09/19/2022)   Overall Financial Resource Strain (CARDIA)    Difficulty of Paying Living Expenses: Not hard at all  Food Insecurity: No Food Insecurity (09/19/2022)   Hunger Vital Sign    Worried About Running Out of Food in the Last Year: Never true    Ran Out of Food in the Last Year: Never true  Transportation Needs: No Transportation Needs (09/19/2022)   PRAPARE - Hydrologist (Medical): No    Lack of Transportation (Non-Medical): No  Physical Activity: Inactive (09/19/2022)   Exercise Vital Sign    Days of Exercise per  Week: 0 days    Minutes of Exercise per Session: 0 min  Stress: No Stress Concern Present (07/22/2021)   [place]    Feeling of Stress : Not at all  Social Connections: Moderately Integrated (09/19/2022)   Social Connection and Isolation Panel [NHANES]    Frequency of Communication with Friends and Family: More than three times a week    Frequency of Social Gatherings with Friends and Family: More than three times a week    Attends Religious Services: Never    Marine scientist or Organizations: Yes    Attends Music therapist: More than 4 times per year    Marital Status: Married  Human resources officer Violence: Not At Risk (09/19/2022)   Humiliation, Afraid, Rape, and Kick questionnaire    Fear of Current or Ex-Partner: No    Emotionally Abused: No    Physically Abused: No    Sexually Abused: No    Family History  Problem Relation Age of Onset   Diabetes Father    Cancer Father 77       Esophogeal Ca., Lung.   Hypertension Other    Thyroid disease Other    Colon cancer Daughter 16   Glaucoma Mother    Hepatitis C Mother 49       Died from.   Glaucoma Sister    Glaucoma Sister    Glaucoma Maternal Aunt     ROS: no fevers or chills, productive cough, hemoptysis, dysphasia, odynophagia, melena, hematochezia, dysuria, hematuria, rash, seizure activity, orthopnea, PND, pedal edema, claudication. Remaining systems are negative.  Physical Exam: Well-developed well-nourished in no acute distress.  Skin is warm and dry.  HEENT is normal.  Neck is supple.  Chest is clear to auscultation with normal expansion.  Cardiovascular exam is regular rate and rhythm.  Abdominal exam nontender or distended. No masses palpated. Extremities show no edema. neuro grossly intact  A/P  1 nonischemic cardiomyopathy-LV function low normal on most recent echocardiogram.  Previous mild reduction possibly secondary to  left bundle branch block/dyssynchrony.  No obstructive coronary disease noted on prior CTA.  Continue Entresto, Toprol, Farxiga and spironolactone.  2 chronic combined systolic/diastolic congestive heart failure-she appears to be euvolemic on examination.  Continue Farxiga and spironolactone.  3 hypertension-blood pressure controlled.  Continue present medical regimen.  4 hyperlipidemia-continue statin.  5 obstructive sleep apnea-continue CPAP.  6 mild coronary artery disease-continue statin.  7 history of valvular heart disease-mild aortic insufficiency on most recent echo.  Kirk Ruths, MD

## 2022-12-21 DIAGNOSIS — Z794 Long term (current) use of insulin: Secondary | ICD-10-CM | POA: Diagnosis not present

## 2022-12-21 DIAGNOSIS — E118 Type 2 diabetes mellitus with unspecified complications: Secondary | ICD-10-CM | POA: Diagnosis not present

## 2022-12-25 DIAGNOSIS — G4733 Obstructive sleep apnea (adult) (pediatric): Secondary | ICD-10-CM | POA: Diagnosis not present

## 2022-12-31 ENCOUNTER — Ambulatory Visit: Payer: Medicare HMO | Admitting: Cardiology

## 2022-12-31 ENCOUNTER — Encounter: Payer: Self-pay | Admitting: Cardiology

## 2022-12-31 VITALS — BP 107/61 | HR 67 | Ht 62.0 in | Wt 195.1 lb

## 2022-12-31 DIAGNOSIS — I5022 Chronic systolic (congestive) heart failure: Secondary | ICD-10-CM | POA: Diagnosis not present

## 2022-12-31 DIAGNOSIS — I429 Cardiomyopathy, unspecified: Secondary | ICD-10-CM | POA: Diagnosis not present

## 2022-12-31 DIAGNOSIS — I10 Essential (primary) hypertension: Secondary | ICD-10-CM

## 2022-12-31 DIAGNOSIS — E7800 Pure hypercholesterolemia, unspecified: Secondary | ICD-10-CM

## 2022-12-31 NOTE — Patient Instructions (Signed)
   Follow-Up: At [facility], you and your health needs are our priority.  As part of our continuing mission to provide you with exceptional heart care, we have created designated Provider Care Teams.  These Care Teams include your primary Cardiologist (physician) and Advanced Practice Providers (APPs -  Physician Assistants and Nurse Practitioners) who all work together to provide you with the care you need, when you need it.  We recommend signing up for the patient portal called "MyChart".  Sign up information is provided on this After Visit Summary.  MyChart is used to connect with patients for Virtual Visits (Telemedicine).  Patients are able to view lab/test results, encounter notes, upcoming appointments, etc.  Non-urgent messages can be sent to your provider as well.   To learn more about what you can do with MyChart, go to [email or web address].    Your next appointment:   6 month(s)  Provider:   Kirk Ruths, MD

## 2023-01-01 ENCOUNTER — Other Ambulatory Visit (HOSPITAL_COMMUNITY): Payer: Self-pay | Admitting: Cardiology

## 2023-01-03 ENCOUNTER — Other Ambulatory Visit: Payer: Medicare HMO | Admitting: Pharmacist

## 2023-01-03 NOTE — Progress Notes (Signed)
01/03/2023 Name: Jillian Huynh MRN: [id number] DOB: 18-Aug-1946  Chief Complaint  Patient presents with   Diabetes   Medication Assistance    Jillian Huynh is a 77 y.o. year old female who presented for a telephone visit.   They were referred to the pharmacist by their PCP for assistance in managing diabetes and medication access.   Subjective:  Care Team: Primary Care Provider: [name], MD ; Next Scheduled Visit: 02/28/23 Cardiologist: Dr Stanford Breed; Next Scheduled Visit: 06/24/23  Medication Access/Adherence  Current Pharmacy:  [facility], [place] [id number] [place] [address] Phone: [phone] Fax: [phone]   Diabetes:  Current medications:  farxiga 63m daily, tresiba 60 units daily, rybelsus 117mdaily  Current glucose readings: 192 Using Libre 2 meter; testing several times daily   Patient denies hypoglycemic s/sx including dizziness, shakiness, sweating. Patient reports hyperglycemic symptoms including polyuria, polydipsia, polyphagia, nocturia, neuropathy, blurred vision.  Current medication access support: rybelsus via novonordisk, farxiga via AZ&Me   Objective:  Lab Results  Component Value Date   HGBA1C 10.0 (A) 11/28/2022    Lab Results  Component Value Date   CREATININE 0.61 11/28/2022   BUN 20 11/28/2022   NA 141 11/28/2022   K 4.6 11/28/2022   CL 107 11/28/2022   CO2 25 11/28/2022    Lab Results  Component Value Date   CHOL 110 12/25/2021   HDL 40 (L) 12/25/2021   LDLCALC 44 12/25/2021   TRIG 188 (H) 12/25/2021   CHOLHDL 2.8 12/25/2021    Medications Reviewed Today     Reviewed by KlDarius BumpRPGreat Neck EstatesPharmacist) on 01/03/23 at 1516  Med List Status: <None>   Medication Order Taking? Sig Documenting Provider Last Dose Status Informant  atorvastatin (LIPITOR) 40 MG tablet [id number] 1 tablet by mouth once daily Crenshaw, BrDenice BorsMD   Active   B-D ULTRAFINE III SHORT PEN 31G X 8 MM MISC [id number]:[id number]-Q Daily MeHali MarryMD  Active   Calcium Carbonate-Vit D-Min (CALCIUM 1200 PO) 25VD:4457496Take 1 tablet by mouth in the morning and at bedtime. [provider]  Active            Med Note (GStanford Scotland Mon Feb 26, 2022 10:31 AM)    Continuous Blood Gluc Sensor (DEXCOM G7 SENSOR) [place]PO:[id number] 1 SENSOR TO SKIN EVERY 10 DAYS MeHali MarryMD  Active   dapagliflozin propanediol (FARXIGA) 10 MG TABS tablet [id number] 1 tablet (10 mg total) by mouth daily before breakfast. MiRafael BihariFNP  Active   ENTRESTO 97-103 MG 40EG:[id number] 1 tablet by mouth twice daily McLarey DresserMD  Active   ezetimibe (ZETIA) 10 MG tablet 40QG:[id number] 1 tablet by mouth once daily CrLelon PerlaMD  Active   Insulin Degludec (TRESIBA [place]) 39XO:[id number] 50 Units into the skin daily. [provider]  Active   metoprolol succinate (TOPROL XL) 50 MG 24 hr tablet [id number]:[id number] 1 tablet (50 mg total) by mouth daily. McLarey DresserMD  Active   omeprazole (PRILOSEC) 20 MG capsule [id number] 20 mg by mouth daily. [provider]  Active   rOPINIRole (REQUIP) 4 MG tablet [id number] 1 tablet by mouth once daily MeHali MarryMD  Active   Semaglutide ([facility] MG TABS [id number] 1 tablet  by mouth daily. [provider]  Active   spironolactone (ALDACTONE) 25 MG tablet [id number]  Take 1 tablet (25 mg total) by mouth daily. Lelon Perla, MD  Active               Assessment/Plan:   Diabetes: - Currently uncontrolled, patient was without Dexcom meter due to cost. She now has obtained [place] 2 using DME company at no cost. - Recommend to continue current medications, will change from rybelsus to ozempic at 2024 patient assistance renewal for higher potency to assist with glycemic control.  - Recommend to check glucose using  Libre 2 - Meets financial criteria for tresiba and ozempic patient assistance program through [employer]. Will collaborate with provider, CPhT, and patient to pursue assistance.  - Screened patient for Medicare Extra Help eligibility, unfortunately does not qualify due to exceeding assets threshold. - Also provided patient with  the contact information for [employer] patient assistance for [facility], but will also arrange forms to be sent for renewal.   Follow Up Plan: 1 month  Larinda Buttery, PharmD Clinical Pharmacist [facility] [phone]

## 2023-01-03 NOTE — Patient Instructions (Addendum)
Izora Gala,  So great catching up with you. As discussed, be on the lookout for paperwork for 2024 resubmitting patient assistance for your medicines.  I have you scheduled for a phone call in one month, but call me at [phone] if you need me sooner!  Take care, Luana Shu, PharmD Clinical Pharmacist [facility] At [facility] [phone]

## 2023-01-04 ENCOUNTER — Other Ambulatory Visit: Payer: Self-pay | Admitting: Family Medicine

## 2023-01-09 ENCOUNTER — Other Ambulatory Visit (HOSPITAL_COMMUNITY): Payer: Self-pay

## 2023-01-20 DIAGNOSIS — Z794 Long term (current) use of insulin: Secondary | ICD-10-CM | POA: Diagnosis not present

## 2023-01-20 DIAGNOSIS — E118 Type 2 diabetes mellitus with unspecified complications: Secondary | ICD-10-CM | POA: Diagnosis not present

## 2023-01-23 DIAGNOSIS — G4733 Obstructive sleep apnea (adult) (pediatric): Secondary | ICD-10-CM | POA: Diagnosis not present

## 2023-01-31 DIAGNOSIS — G4733 Obstructive sleep apnea (adult) (pediatric): Secondary | ICD-10-CM | POA: Diagnosis not present

## 2023-02-05 ENCOUNTER — Other Ambulatory Visit: Payer: Self-pay | Admitting: Pharmacist

## 2023-02-05 NOTE — Progress Notes (Signed)
02/05/2023 Name: Jillian Huynh MRN: [id number] DOB: 02-Dec-1945  Chief Complaint  Patient presents with   Medication Assistance    Jillian Huynh is a 77 y.o. year old female who presented for a telephone visit.   They were referred to the pharmacist by their PCP for assistance in managing diabetes and medication access.   Patient states she went without her insulin accidentally by forgetting to pack on vacation. However, she watched her diet very carefully and states her sugars never exceeded 300.   Subjective:  Care Team: Primary Care Provider: [name], MD ; Next Scheduled Visit: 02/28/23 Cardiologist: Dr Stanford Breed; Next Scheduled Visit: 06/24/23  Medication Access/Adherence  Current Pharmacy:  [place], [place] [id number] [place] [address] Phone: [phone] Fax: [phone]   Diabetes:  Current medications:  farxiga 10mg  daily, tresiba 60 units daily, rybelsus 14mg  daily  Current glucose readings: 192 Using Libre 2 meter; testing several times daily   Patient denies hypoglycemic s/sx including dizziness, shakiness, sweating. Patient reports hyperglycemic symptoms including polyuria, polydipsia, polyphagia, nocturia, neuropathy, blurred vision.  Current medication access support: rybelsus via novonordisk, farxiga via AZ&Me   Objective:  Lab Results  Component Value Date   HGBA1C 10.0 (A) 11/28/2022    Lab Results  Component Value Date   CREATININE 0.61 11/28/2022   BUN 20 11/28/2022   NA 141 11/28/2022   K 4.6 11/28/2022   CL 107 11/28/2022   CO2 25 11/28/2022    Lab Results  Component Value Date   CHOL 110 12/25/2021   HDL 40 (L) 12/25/2021   LDLCALC 44 12/25/2021   TRIG 188 (H) 12/25/2021   CHOLHDL 2.8 12/25/2021    Medications Reviewed Today     Reviewed by Darius Bump, [place] (Pharmacist) on 02/05/23 at 1530  Med List Status: <None>   Medication  Order Taking? Sig Documenting Provider Last Dose Status Informant  atorvastatin (LIPITOR) 40 MG tablet [id number] No Take 1 tablet by mouth once daily Crenshaw, Denice Bors, MD Taking Active   B-D ULTRAFINE III SHORT PEN 31G X 8 MM MISC QI:[id number] No SUB-Q Daily [name], MD Taking Active   Calcium Carbonate-Vit D-Min (CALCIUM 1200 PO) VD:[id number] No Take 1 tablet by mouth in the morning and at bedtime. [provider] Taking Active            Med Note Stanford Scotland   Mon Feb 26, 2022 10:31 AM)    Continuous Blood Gluc Receiver (FREESTYLE LIBRE 2 READER) DEVI MG:[id number]  1 each by Does not apply route once. Used with sensors, obtained via DME [provider]  Active   Continuous Blood Gluc Sensor (FREESTYLE LIBRE 2 SENSOR) MISC [id number]  1 Application by Does not apply route every 14 (fourteen) days. Patient obtains via DME [provider]  Active   dapagliflozin propanediol (FARXIGA) 10 MG TABS tablet [id number] No Take 1 tablet (10 mg total) by mouth daily before breakfast. Rafael Bihari, FNP Taking Active   ENTRESTO 97-103 MG EG:[id number]  Take 1 tablet by mouth twice daily Larey Dresser, MD  Active   ezetimibe (ZETIA) 10 MG tablet [id number] No Take 1 tablet by mouth once daily Lelon Perla, MD Taking Active   Insulin Degludec (TRESIBA [place]) XO:[id number] No Inject 50 Units into the skin daily. [provider] Taking Active   metoprolol succinate (TOPROL XL) 50 MG 24 hr tablet [id number]:[id number] No Take 1 tablet (  50 mg total) by mouth daily. Larey Dresser, MD Taking Active   omeprazole (PRILOSEC) 20 MG capsule [id number] No Take 20 mg by mouth daily. [provider] Taking Active   rOPINIRole (REQUIP) 4 MG tablet DF:[id number]  Take 1 tablet by mouth once daily [name], MD  Active   Semaglutide (RYBELSUS) 14 MG TABS FE:[id number] No Take 1 tablet by mouth daily. [provider] Taking Active   spironolactone (ALDACTONE) 25 MG  tablet SB:[id number] No Take 1 tablet (25 mg total) by mouth daily. Lelon Perla, MD Taking Active              Assessment/Plan:   Diabetes: - Currently uncontrolled, working on food choices and medication adherence to improve glucose - Recommend to continue current medications, will change from rybelsus to ozempic at 2024 patient assistance renewal for higher potency to assist with glycemic control.  - Recommend to check glucose using Libre 2 - Meets financial criteria for tresiba and ozempic patient assistance program through [employer]. Will collaborate with RX med assist team to seek update of processing applications for tresiba, ozempic, pen needles, and entresto.--> will seek update from med assistance team - Screened patient for Medicare Extra Help eligibility, unfortunately does not qualify due to exceeding assets threshold.  Follow Up Plan: 1 month  Larinda Buttery, PharmD Clinical Pharmacist [facility] [phone]

## 2023-02-06 ENCOUNTER — Other Ambulatory Visit (HOSPITAL_COMMUNITY): Payer: Self-pay

## 2023-02-06 ENCOUNTER — Telehealth: Payer: Self-pay

## 2023-02-06 NOTE — Telephone Encounter (Signed)
-----   Message from Darius Bump, [facility] sent at 02/05/2023  3:43 PM EDT ----- Can we obtain status updates for [employer] patient assistance for tresiba, ozempic, pen needles? And for [employer] for entresto?  Thanks, Lysle Morales

## 2023-02-06 NOTE — Telephone Encounter (Signed)
Novonordisk applications submitted online, waiting on MD portion of Arboriculturist for [employer].

## 2023-02-09 ENCOUNTER — Other Ambulatory Visit (HOSPITAL_COMMUNITY): Payer: Self-pay | Admitting: Cardiology

## 2023-02-10 ENCOUNTER — Other Ambulatory Visit: Payer: Self-pay | Admitting: Cardiology

## 2023-02-10 DIAGNOSIS — E7800 Pure hypercholesterolemia, unspecified: Secondary | ICD-10-CM

## 2023-02-11 NOTE — Telephone Encounter (Signed)
NovoN[employer] applications have been approved until 11/12/2023. Medications will be mailed to provider's office within 10 to 14 business days.

## 2023-02-19 DIAGNOSIS — Z794 Long term (current) use of insulin: Secondary | ICD-10-CM | POA: Diagnosis not present

## 2023-02-19 DIAGNOSIS — E118 Type 2 diabetes mellitus with unspecified complications: Secondary | ICD-10-CM | POA: Diagnosis not present

## 2023-02-23 DIAGNOSIS — G4733 Obstructive sleep apnea (adult) (pediatric): Secondary | ICD-10-CM | POA: Diagnosis not present

## 2023-02-28 ENCOUNTER — Other Ambulatory Visit (HOSPITAL_COMMUNITY): Payer: Self-pay

## 2023-02-28 ENCOUNTER — Other Ambulatory Visit: Payer: Self-pay | Admitting: *Deleted

## 2023-02-28 ENCOUNTER — Ambulatory Visit (INDEPENDENT_AMBULATORY_CARE_PROVIDER_SITE_OTHER): Payer: Medicare HMO | Admitting: Family Medicine

## 2023-02-28 ENCOUNTER — Encounter: Payer: Self-pay | Admitting: Family Medicine

## 2023-02-28 VITALS — BP 87/59 | HR 71 | Ht 62.0 in | Wt 192.0 lb

## 2023-02-28 DIAGNOSIS — I10 Essential (primary) hypertension: Secondary | ICD-10-CM | POA: Diagnosis not present

## 2023-02-28 DIAGNOSIS — E118 Type 2 diabetes mellitus with unspecified complications: Secondary | ICD-10-CM | POA: Diagnosis not present

## 2023-02-28 DIAGNOSIS — Z79899 Other long term (current) drug therapy: Secondary | ICD-10-CM | POA: Diagnosis not present

## 2023-02-28 DIAGNOSIS — I952 Hypotension due to drugs: Secondary | ICD-10-CM | POA: Diagnosis not present

## 2023-02-28 DIAGNOSIS — G629 Polyneuropathy, unspecified: Secondary | ICD-10-CM | POA: Diagnosis not present

## 2023-02-28 DIAGNOSIS — R5383 Other fatigue: Secondary | ICD-10-CM | POA: Diagnosis not present

## 2023-02-28 LAB — POCT GLYCOSYLATED HEMOGLOBIN (HGB A1C): Hemoglobin A1C: 9.5 % — AB (ref 4.0–5.6)

## 2023-02-28 LAB — TSH: TSH: 4.34 mIU/L (ref 0.40–4.50)

## 2023-02-28 LAB — CBC WITH DIFFERENTIAL/PLATELET
MCV: 85.5 fL (ref 80.0–100.0)
Neutrophils Relative %: 67.4 %
Platelets: 244 10*3/uL (ref 140–400)

## 2023-02-28 LAB — VITAMIN B12: Vitamin B-12: 648 pg/mL (ref 200–1100)

## 2023-02-28 MED ORDER — METOPROLOL SUCCINATE ER 25 MG PO TB24: 25.0000 mg | ORAL_TABLET | Freq: Every day | ORAL | 1 refills | Status: DC

## 2023-02-28 NOTE — Progress Notes (Signed)
Established Patient Office Visit  Subjective   Patient ID: Jillian Huynh, female    DOB: 08-12-1946  Age: 77 y.o. MRN: [id number]  Chief Complaint  Patient presents with   Diabetes   Hypertension    HPI  She is here today for follow-up of diabetes and hypertension.  Her blood pressures have been running low sometimes in the 90s at home.  She just feels tired and weak.  She feels like mentally she is getting much more easily confused.  She is actually quite tearful today.  She just feels like her quality of life is not nearly what it was she had to give up working a year or so ago.  She is already splitting the metoprolol in half with the permission of cardiology so she is only taking 25 mg.  She also recently started a B12 in an effort to try to feel better.  Diabetes-she is using 60 units of insulin and is also on 14 mg of Rybelsus and 10 mg of Farxiga.  She is also getting some numbness and tingling in her lower legs and into her feet.  Nothing higher than her knees.  It is affecting both feet and has been going on for at least 6 months.     ROS    Objective:     BP (!) 87/59   Pulse 71   Ht 5\' 2"  (1.575 m)   Wt 192 lb (87.1 kg)   SpO2 98%   BMI 35.12 kg/m    Physical Exam Vitals and nursing note reviewed.  Constitutional:      Appearance: She is well-developed.  HENT:     Head: Normocephalic and atraumatic.  Cardiovascular:     Rate and Rhythm: Normal rate and regular rhythm.     Heart sounds: Normal heart sounds.  Pulmonary:     Effort: Pulmonary effort is normal.     Breath sounds: Normal breath sounds.  Skin:    General: Skin is warm and dry.  Neurological:     Mental Status: She is alert and oriented to person, place, and time.  Psychiatric:        Behavior: Behavior normal.      Results for orders placed or performed in visit on 02/28/23  POCT glycosylated hemoglobin (Hb A1C)  Result Value Ref Range   Hemoglobin A1C 9.5 (A) 4.0 - 5.6 %   HbA1c  POC (<> result, manual entry)     HbA1c, POC (prediabetic range)     HbA1c, POC (controlled diabetic range)        The ASCVD Risk score (Arnett DK, et al., 2019) failed to calculate for the following reasons:   The valid systolic blood pressure range is 90 to 200 mmHg   The valid total cholesterol range is 130 to 320 mg/dL    Assessment & Plan:   Problem List Items Addressed This Visit       Cardiovascular and Mediastinum   BP (high blood pressure)    Change metoprolol to 25 mg so that she does not have to split the 50 mg it sounds like again she is getting confused and accidentally sometimes taking a whole pill versus a half of a pill.      Relevant Medications   metoprolol succinate (TOPROL XL) 25 MG 24 hr tablet     Endocrine   Diabetes mellitus type 2, controlled - Primary    As he is uncontrolled right now.  It was previously 10 it is  now down to 9.5 but still not nearly where we need to be we did discuss the implications of not getting this under good control she is having more issues with confusion and it does make me wonder if she is having more more difficulty with taking her medications she would probably benefit from a pillbox.  I am going to cut her [name] back but primarily because her blood pressure is so low and with her heart failure it is important to keep her on the Entresto and the spironolactone if at all possible so organ to try cutting the Farxiga down to 5 mg.  Blood pressure was particularly low today so we did have her drink some more fluids before she left today.      Relevant Orders   POCT glycosylated hemoglobin (Hb A1C) (Completed)   B12   Vitamin B1   Vitamin B6   Magnesium   CBC with Differential/Platelet   COMPLETE METABOLIC PANEL WITH GFR   TSH   Fe+TIBC+Fer   Other Visit Diagnoses     Neuropathy       Relevant Orders   B12   Vitamin B1   Vitamin B6   Magnesium   CBC with Differential/Platelet   COMPLETE METABOLIC PANEL WITH GFR    TSH   Fe+TIBC+Fer   Hypotension due to drugs       Relevant Medications   metoprolol succinate (TOPROL XL) 25 MG 24 hr tablet   Other Relevant Orders   B12   Vitamin B1   Vitamin B6   Magnesium   CBC with Differential/Platelet   COMPLETE METABOLIC PANEL WITH GFR   TSH   Fe+TIBC+Fer   Other fatigue       Relevant Orders   B12   Vitamin B1   Vitamin B6   Magnesium   CBC with Differential/Platelet   COMPLETE METABOLIC PANEL WITH GFR   TSH   Fe+TIBC+Fer      Fatigue-I think some of the low blood pressure could absolutely be contributing to her symptoms.  So we discussed cutting the Farxiga in half to see if that helps she is already taking a half a tab of the metoprolol but more recently was mistakenly taking a whole tab again and then realized it and started taking half again.  So at this point organ to go ahead and change the metoprolol to 25 mg.  He also quit taking her Zetia about 3 days ago because she was reading the side effects on the medication and felt like it could be contributing to what is going on.  I reassured her that I do not think it is causing her symptoms but if she wants to hold it until I see her back in 2 weeks that is perfectly fine.  If she is not noticing much improvement then we will probably add it back.  We did discuss the possibility of holding her statin though.  It could certainly cause fatigue weakness and memory problems so it may be worth holding that medication for 2 to 3 weeks to see if she notices a difference.  Will also check her thyroid and check for mineral deficiencies.  Return in about 2 weeks (around 03/14/2023) for low BP and weakness and neuropahty .    Nani Gasser, MD

## 2023-02-28 NOTE — Patient Instructions (Signed)
Please cut your Jillian Huynh in half.   Take half a tab daily Ok to hold the Zetia until I see you back in 2 weeks.

## 2023-02-28 NOTE — Assessment & Plan Note (Signed)
Change metoprolol to 25 mg so that she does not have to split the 50 mg it sounds like again she is getting confused and accidentally sometimes taking a whole pill versus a half of a pill.

## 2023-02-28 NOTE — Assessment & Plan Note (Addendum)
As he is uncontrolled right now.  It was previously 10 it is now down to 9.5 but still not nearly where we need to be we did discuss the implications of not getting this under good control she is having more issues with confusion and it does make me wonder if she is having more more difficulty with taking her medications she would probably benefit from a pillbox.  I am going to cut her [name] back but primarily because her blood pressure is so low and with her heart failure it is important to keep her on the Entresto and the spironolactone if at all possible so organ to try cutting the Farxiga down to 5 mg.  Blood pressure was particularly low today so we did have her drink some more fluids before she left today.

## 2023-02-28 NOTE — Telephone Encounter (Signed)
Tiffany with [employer] Patient Assistance called and asked for the prescription for Ms. Battey's Entresto 97-103 mg tablets.  I informed her that this wasn't a medication that Dr. Linford Arnold was prescribing for her, and that Dr. Shirlee Latch writes for this.   I looked back in her chart to see what was going on with the patient asst. And it looks like [facility] and Nena Jordan CPhT were working with Jillian Huynh to get her prescriptions and awaiting this particular one.   Will forward to Dr. Linford Arnold for signature. And print and fax once signed.

## 2023-03-01 ENCOUNTER — Other Ambulatory Visit: Payer: Medicare HMO | Admitting: Pharmacist

## 2023-03-01 ENCOUNTER — Telehealth (HOSPITAL_COMMUNITY): Payer: Self-pay | Admitting: Pharmacy Technician

## 2023-03-01 LAB — IRON,TIBC AND FERRITIN PANEL
Ferritin: 69 ng/mL (ref 16–288)
Iron: 94 ug/dL (ref 45–160)
TIBC: 398 mcg/dL (calc) (ref 250–450)

## 2023-03-01 LAB — CBC WITH DIFFERENTIAL/PLATELET
Absolute Monocytes: 482 cells/uL (ref 200–950)
Basophils Absolute: 55 cells/uL (ref 0–200)
Basophils Relative: 0.6 %
Eosinophils Absolute: 164 cells/uL (ref 15–500)
Eosinophils Relative: 1.8 %
HCT: 40.2 % (ref 35.0–45.0)
Hemoglobin: 13 g/dL (ref 11.7–15.5)
Lymphs Abs: 2266 cells/uL (ref 850–3900)
MCHC: 32.3 g/dL (ref 32.0–36.0)
MPV: 11.4 fL (ref 7.5–12.5)
Total Lymphocyte: 24.9 %
WBC: 9.1 10*3/uL (ref 3.8–10.8)

## 2023-03-01 LAB — COMPLETE METABOLIC PANEL WITH GFR
Alkaline phosphatase (APISO): 68 U/L (ref 37–153)
BUN/Creatinine Ratio: 37 (calc) — ABNORMAL HIGH (ref 6–22)
BUN: 26 mg/dL — ABNORMAL HIGH (ref 7–25)
CO2: 25 mmol/L (ref 20–32)

## 2023-03-01 NOTE — Telephone Encounter (Signed)
Advanced Heart Failure Patient Advocate Encounter  The patient was approved for a grant that will help cover the cost of Entresto, Farxiga, Metoprolol, Spironolactone. Total amount awarded, $10,000. Eligibility, 01/30/23 - 01/29/24.  ID [id number]  BIN [id number]  PCN [id number]  Group [id number]  Called and spoke with the patient. Emailed copy of grant information. Patient is aware we will not seek assistance through the manufacturer at this time.  Archer Asa, CPhT

## 2023-03-01 NOTE — Telephone Encounter (Signed)
That would be wonderful! Is renewing the grant something you are able to do?

## 2023-03-01 NOTE — Progress Notes (Signed)
03/01/2023 Name: Jillian Huynh MRN: [id number] DOB: 1946-02-28  Chief Complaint  Patient presents with   Diabetes   Hypertension   Medication Assistance    Jillian Huynh is a 77 y.o. year old female who presented for a telephone visit.   They were referred to the pharmacist by their PCP for assistance in managing diabetes and medication access.   Outreached patient to discuss approved patient assistance through [employer] for ozempic, tresiba, and pen needles.  Of note, patient expressed concern over blood pressure - she checked just before my call and was 89/34. Reviewed for signs/symptoms of acute hemodynamic instability. Soft pressures and fatigue are an ongoing issue for her.   Recommended she contact cardiology. She did but could not get an answer. Provided additional recommendation to hydrate, recheck blood pressure in 1 hour, and seek medical attention if change in mental status or acute symptoms.    Subjective:  Care Team: Primary Care Provider: Agapito Games, MD ; Next Scheduled Visit: 02/28/23 Cardiologist: Dr Jens Som; Next Scheduled Visit: 06/24/23  Medication Access/Adherence  Current Pharmacy:  [facility] - [place], [place] - [address] [place] [address] Phone: [phone] Fax: [phone]   Diabetes:  Current medications:  farxiga  daily, tresiba 60 units daily, rybelsus  daily  Current glucose readings: 190s Using Libre 2 meter; testing several times daily   Patient denies hypoglycemic s/sx including dizziness, shakiness, sweating. Patient reports hyperglycemic symptoms including polyuria, polydipsia, polyphagia, nocturia, neuropathy, blurred vision.  Current medication access support: ozempic, tresiba, pen needles via nov[employer]isk, farxiga via [place]&Me   Objective:  Lab Results  Component Value Date   HGBA1C 9.5 (A) 02/28/2023    Lab Results  Component Value Date    CREATININE 0.70 02/28/2023   BUN 26 (H) 02/28/2023   NA 139 02/28/2023   K 4.6 02/28/2023   CL 101 02/28/2023   CO2 25 02/28/2023    Lab Results  Component Value Date   CHOL 110 12/25/2021   HDL 40 (L) 12/25/2021   LDLCALC 44 12/25/2021   TRIG 188 (H) 12/25/2021   CHOLHDL 2.8 12/25/2021    Medications Reviewed Today     Reviewed by Deno Etienne, CMA (Certified Medical Assistant) on 02/28/23 at 1103  Med List Status: <None>   Medication Order Taking? Sig Documenting Provider Last Dose Status Informant  atorvastatin (LIPITOR) 40 MG tablet [id number] Yes Take 1 tablet (40 mg total) by mouth daily. Ronney Asters, NP Taking Active   B-D ULTRAFINE III SHORT PEN 31G X 8 MM MISC [id number] Yes SUB-Q Daily Agapito Games, MD Taking Active   Calcium Carbonate-Vit D-Min (CALCIUM 1200 PO) [id number] Yes Take 1 tablet by mouth in the morning and at bedtime. [provider] Taking Active            Med Note Suezanne Cheshire   Mon Feb 26, 2022 10:31 AM)    Continuous Blood Gluc Receiver (FREESTYLE LIBRE 2 READER) DEVI [id number] Yes 1 each by Does not apply route once. Used with sensors, obtained via DME [provider] Taking Active   Continuous Blood Gluc Sensor (FREESTYLE LIBRE 2 SENSOR) MISC [id number] Yes 1 Application by Does not apply route every 14 (fourteen) days. Patient obtains via DME [provider] Taking Active   dapagliflozin propanediol (FARXIGA) 10 MG TABS tablet [id number] Yes Take 1 tablet (10 mg total) by mouth daily before breakfast. Wende Mott, Anderson Malta, FNP Taking Active   Sherryll Burger  97-103 MG [id number] Yes Take 1 tablet by mouth twice daily Laurey Morale, MD Taking Active   ezetimibe (ZETIA) 10 MG tablet [id number] Yes Take 1 tablet by mouth once daily Lewayne Bunting, MD Taking Active   Insulin Degludec (TRESIBA [place]) [id number] Yes Inject 50 Units into the skin daily. [provider] Taking Active   metoprolol succinate (TOPROL  XL) 50 MG 24 hr tablet [id number] Yes Take 1 tablet (50 mg total) by mouth daily.  Patient taking differently: Take 25 mg by mouth daily.   Laurey Morale, MD Taking Active   omeprazole (PRILOSEC) 20 MG capsule [id number] Yes Take 20 mg by mouth daily. [provider] Taking Active   rOPINIRole (REQUIP) 4 MG tablet [id number] Yes Take 1 tablet by mouth once daily Agapito Games, MD Taking Active   Semaglutide [facility]) 14 MG TABS [id number] Yes Take 1 tablet by mouth daily. [provider] Taking Active   spironolactone (ALDACTONE) 25 MG tablet [id number] Yes Take 1 tablet (25 mg total) by mouth daily. Lewayne Bunting, MD Taking Active              Assessment/Plan:   Diabetes: - Currently uncontrolled, working on food choices and medication adherence to improve glucose - Recommend to continue current medications, will change from rybelsus to ozempic at 2024 patient assistance renewal for higher potency to assist with glycemic control. Approved and awaiting medication supply delivered to office.  - Counseled patient that ozempic will REPLACE rybelsus. - Recommend to check glucose using Libre 2 - Screened patient for Medicare Extra Help eligibility, unfortunately does not qualify due to exceeding assets threshold.  Follow Up Plan: 2-4 weeks  Lynnda Shields, PharmD Clinical Pharmacist [facility] [phone]

## 2023-03-01 NOTE — Telephone Encounter (Signed)
[employer] application for pt assistance discontinued due to grant availability, Craig Staggers will be documenting in new encounter.

## 2023-03-01 NOTE — Telephone Encounter (Signed)
Hey, I've already sent in Dr. Shelah Lewandowsky signed provider portion for [facility] pt assistance. I guess the script is written by Dr. Shirlee Latch, would you mind helping [place] out with this? I will create a new MD portion to be signed by Dr. Shirlee Latch and fax it to the office within the hour. Thank you so much for any help you can provide [place]!

## 2023-03-01 NOTE — Progress Notes (Signed)
Hi Jillian Huynh, you do look a little dry on the blood work.  So try to increase her fluids and today her blood pressure was a little low yesterday so again just try to make sure you are getting 68 to 80 ounces of water in a day.  Your B12, magnesium, and iron look great.  Blood count is normal.  Your thyroid shifted slightly.  I do want to keep an eye on this and plan to recheck again in about 3 to 4 months.  I want a make sure that it is not trending in a direction.

## 2023-03-01 NOTE — Telephone Encounter (Signed)
Absolutely! Will do. Thank you so much!

## 2023-03-01 NOTE — Telephone Encounter (Signed)
I am going to forward this to [facility] because it actually looks like a printed prescription was done by Dr. Shirlee Latch 2 weeks ago.  So I highly suspect that he is probably already printed and signed it and its already been faxed and for some reason I cannot find it.  But I am going to reach out to [facility] and ala.

## 2023-03-02 ENCOUNTER — Emergency Department (HOSPITAL_BASED_OUTPATIENT_CLINIC_OR_DEPARTMENT_OTHER)
Admission: EM | Admit: 2023-03-02 | Discharge: 2023-03-02 | Disposition: A | Discharge status: Home / Self Care | Payer: Medicare HMO | Attending: Emergency Medicine | Admitting: Emergency Medicine

## 2023-03-02 ENCOUNTER — Emergency Department (HOSPITAL_BASED_OUTPATIENT_CLINIC_OR_DEPARTMENT_OTHER): Payer: Medicare HMO

## 2023-03-02 ENCOUNTER — Encounter (HOSPITAL_BASED_OUTPATIENT_CLINIC_OR_DEPARTMENT_OTHER): Payer: Self-pay | Admitting: Emergency Medicine

## 2023-03-02 ENCOUNTER — Other Ambulatory Visit: Payer: Self-pay

## 2023-03-02 DIAGNOSIS — I959 Hypotension, unspecified: Secondary | ICD-10-CM | POA: Insufficient documentation

## 2023-03-02 DIAGNOSIS — Z79899 Other long term (current) drug therapy: Secondary | ICD-10-CM | POA: Insufficient documentation

## 2023-03-02 DIAGNOSIS — I10 Essential (primary) hypertension: Secondary | ICD-10-CM | POA: Insufficient documentation

## 2023-03-02 DIAGNOSIS — J9811 Atelectasis: Secondary | ICD-10-CM | POA: Diagnosis not present

## 2023-03-02 DIAGNOSIS — Z794 Long term (current) use of insulin: Secondary | ICD-10-CM | POA: Insufficient documentation

## 2023-03-02 LAB — HEPATIC FUNCTION PANEL
ALT: 20 U/L (ref 0–44)
AST: 20 U/L (ref 15–41)
Albumin: 4.1 g/dL (ref 3.5–5.0)
Alkaline Phosphatase: 61 U/L (ref 38–126)
Bilirubin, Direct: 0.2 mg/dL (ref 0.0–0.2)
Indirect Bilirubin: 1.1 mg/dL — ABNORMAL HIGH (ref 0.3–0.9)
Total Bilirubin: 1.3 mg/dL — ABNORMAL HIGH (ref 0.3–1.2)
Total Protein: 6.8 g/dL (ref 6.5–8.1)

## 2023-03-02 LAB — URINALYSIS, ROUTINE W REFLEX MICROSCOPIC
Bilirubin Urine: NEGATIVE
Glucose, UA: >=500 mg/dL — AB
Hgb urine dipstick: NEGATIVE
Ketones, ur: NEGATIVE mg/dL
Leukocytes,Ua: NEGATIVE
Nitrite: NEGATIVE
Protein, ur: NEGATIVE mg/dL
Specific Gravity, Urine: 1.015 (ref 1.005–1.030)
pH: 5.5 (ref 5.0–8.0)

## 2023-03-02 LAB — CBC
HCT: 37.2 % (ref 36.0–46.0)
Hemoglobin: 12.1 g/dL (ref 12.0–15.0)
MCH: 27.9 pg (ref 26.0–34.0)
MCHC: 32.5 g/dL (ref 30.0–36.0)
MCV: 85.9 fL (ref 80.0–100.0)
Platelets: 228 10*3/uL (ref 150–400)
RBC: 4.33 MIL/uL (ref 3.87–5.11)
RDW: 15 % (ref 11.5–15.5)
WBC: 7.7 10*3/uL (ref 4.0–10.5)
nRBC: 0 % (ref 0.0–0.2)

## 2023-03-02 LAB — LACTIC ACID, PLASMA: Lactic Acid, Venous: 1.3 mmol/L (ref 0.5–1.9)

## 2023-03-02 LAB — URINALYSIS, MICROSCOPIC (REFLEX)

## 2023-03-02 LAB — BASIC METABOLIC PANEL
Anion gap: 9 (ref 5–15)
BUN: 24 mg/dL — ABNORMAL HIGH (ref 8–23)
CO2: 22 mmol/L (ref 22–32)
Calcium: 9.1 mg/dL (ref 8.9–10.3)
Chloride: 102 mmol/L (ref 98–111)
Creatinine, Ser: 0.71 mg/dL (ref 0.44–1.00)
GFR, Estimated: >60 mL/min (ref >60)
Glucose, Bld: 224 mg/dL — ABNORMAL HIGH (ref 70–99)
Potassium: 4 mmol/L (ref 3.5–5.1)
Sodium: 133 mmol/L — ABNORMAL LOW (ref 135–145)

## 2023-03-02 LAB — CBG MONITORING, ED: Glucose-Capillary: 191 mg/dL — ABNORMAL HIGH (ref 70–99)

## 2023-03-02 LAB — ETHANOL: Alcohol, Ethyl (B): <10 mg/dL (ref <10)

## 2023-03-02 MED ORDER — SODIUM CHLORIDE 0.9 % IV BOLUS
1000.0000 mL | Freq: Once | INTRAVENOUS | Status: AC
  Administered 2023-03-02: 1000 mL via INTRAVENOUS

## 2023-03-02 NOTE — ED Triage Notes (Signed)
Patient arrived via POV c/o hypotension x 3 days. Patient states running low at home. Has hx of hypertension. States taking medications as directed. Patient is AO x 4, VS w/hypotension, slow gait.

## 2023-03-02 NOTE — Discharge Instructions (Signed)
There was no obvious laboratory finding that was concerning for your low blood pressure readings at home.  Please eat and drink as well as you can for the next 48 hours.  I would have you consider stopping her blood pressure medicine altogether.  Please return if you pass out or if you have worsening symptoms.

## 2023-03-02 NOTE — ED Provider Notes (Signed)
EMERGENCY DEPARTMENT AT [facility] Provider Note   CSN: [id number] Arrival date & time: 03/02/23  2021     History  Chief Complaint  Patient presents with   Hypotension    Jillian Huynh is a 77 y.o. female.  77 yo F with a chief complaints of low blood pressure.  She started telling me that this been going on for couple days and then later tells me that this been going on for a long time.  She has been very fatigued.  Knows that something is wrong with her.  Followed up with her family doctor in the office and was hypertensive there.  Had her medications reduced.  Had blood test sent off.  No obvious finding.  Continue to check her blood pressure regularly for the next 3 days and then decided to come to the ED for evaluation.  She denies cough congestion or fever denies nausea vomiting or diarrhea.  No rashes.        Home Medications Prior to Admission medications   Medication Sig Start Date End Date Taking? Authorizing Provider  atorvastatin (LIPITOR) 40 MG tablet Take 1 tablet (40 mg total) by mouth daily. 02/11/23   Ronney Asters, NP  B-D ULTRAFINE III SHORT PEN 31G X 8 MM MISC SUB-Q Daily 01/15/22   Agapito Games, MD  Calcium Carbonate-Vit D-Min (CALCIUM 1200 PO) Take 1 tablet by mouth in the morning and at bedtime.    [provider]  Continuous Blood Gluc Receiver (FREESTYLE LIBRE 2 READER) DEVI 1 each by Does not apply route once. Used with sensors, obtained via DME    [provider]  Continuous Blood Gluc Sensor (FREESTYLE LIBRE 2 SENSOR) MISC 1 Application by Does not apply route every 14 (fourteen) days. Patient obtains via DME    [provider]  dapagliflozin propanediol (FARXIGA) 10 MG TABS tablet Take 1 tablet (10 mg total) by mouth daily before breakfast. 08/15/22   Jacklynn Ganong, FNP  ENTRESTO 97-103 MG Take 1 tablet by mouth twice daily 02/11/23   Laurey Morale, MD  ezetimibe (ZETIA) 10 MG tablet Take 1  tablet by mouth once daily Patient not taking: Reported on 03/01/2023 10/10/22   Lewayne Bunting, MD  Insulin Degludec (TRESIBA [place]) Inject 50 Units into the skin daily.    [provider]  metoprolol succinate (TOPROL XL) 25 MG 24 hr tablet Take 1 tablet (25 mg total) by mouth daily. 02/28/23   Agapito Games, MD  omeprazole (PRILOSEC) 20 MG capsule Take 20 mg by mouth daily.    [provider]  rOPINIRole (REQUIP) 4 MG tablet Take 1 tablet by mouth once daily 01/04/23   Agapito Games, MD  Semaglutide (RYBELSUS) 14 MG TABS Take 1 tablet by mouth daily.    [provider]  spironolactone (ALDACTONE) 25 MG tablet Take 1 tablet (25 mg total) by mouth daily. 09/12/22   Lewayne Bunting, MD      Allergies    Bydureon [exenatide], Glipizide, Trulicity [dulaglutide], Invokana [canagliflozin], and Victoza [liraglutide]    Review of Systems   Review of Systems  Physical Exam Updated Vital Signs BP (!) 108/59   Pulse 62   Temp 98.2 F (36.8 C) (Oral)   Resp 14   Ht  (1.575 m)   Wt 87.1 kg   SpO2 98%   BMI 35.12 kg/m  Physical Exam Vitals and nursing note reviewed.  Constitutional:  General: She is not in acute distress.    Appearance: She is well-developed. She is not diaphoretic.  HENT:     Head: Normocephalic and atraumatic.  Eyes:     Pupils: Pupils are equal, round, and reactive to light.  Cardiovascular:     Rate and Rhythm: Normal rate and regular rhythm.     Heart sounds: No murmur heard.    No friction rub. No gallop.  Pulmonary:     Effort: Pulmonary effort is normal.     Breath sounds: No wheezing or rales.  Abdominal:     General: There is no distension.     Palpations: Abdomen is soft.     Tenderness: There is no abdominal tenderness.  Musculoskeletal:        General: No tenderness.     Cervical back: Normal range of motion and neck supple.  Skin:    General: Skin is warm and dry.  Neurological:     Mental  Status: She is alert and oriented to person, place, and time.  Psychiatric:        Behavior: Behavior normal.     ED Results / Procedures / Treatments   Labs (all labs ordered are listed, but only abnormal results are displayed) Labs Reviewed  BASIC METABOLIC PANEL - Abnormal; Notable for the following components:      Result Value   Sodium 133 (*)    Glucose, Bld 224 (*)    BUN 24 (*)    All other components within normal limits  URINALYSIS, ROUTINE W REFLEX MICROSCOPIC - Abnormal; Notable for the following components:   APPearance HAZY (*)    Glucose, UA >=500 (*)    All other components within normal limits  HEPATIC FUNCTION PANEL - Abnormal; Notable for the following components:   Total Bilirubin 1.3 (*)    Indirect Bilirubin 1.1 (*)    All other components within normal limits  URINALYSIS, MICROSCOPIC (REFLEX) - Abnormal; Notable for the following components:   Bacteria, UA RARE (*)    All other components within normal limits  CBG MONITORING, ED - Abnormal; Notable for the following components:   Glucose-Capillary 191 (*)    All other components within normal limits  CULTURE, BLOOD (ROUTINE X 2)  CULTURE, BLOOD (ROUTINE X 2)  CBC  LACTIC ACID, PLASMA  ETHANOL    EKG EKG Interpretation  Date/Time:  Saturday March 02 2023 20:31:54 EDT Ventricular Rate:  71 PR Interval:  202 QRS Duration: 151 QT Interval:  430 QTC Calculation: 468 R Axis:   -21 Text Interpretation: Sinus rhythm Left bundle branch block No significant change since last tracing Confirmed by Melene Plan [phone]) on 03/02/2023 9:01:11 PM  Radiology DG Chest Port 1 View  Result Date: 03/02/2023 CLINICAL DATA:  Questionable sepsis - evaluate for abnormality EXAM: PORTABLE CHEST 1 VIEW COMPARISON:  None Available. FINDINGS: Heart and mediastinal contours within normal limits. Minimal bibasilar opacities, likely atelectasis. No effusions or acute bony abnormality. IMPRESSION: Minimal bibasilar atelectasis.  Electronically Signed   By: Charlett Nose M.D.   On: 03/02/2023 21:26    Procedures Procedures    Medications Ordered in ED Medications  sodium chloride 0.9 % bolus 1,000 mL (0 mLs Intravenous Stopped 03/02/23 2200)    ED Course/ Medical Decision Making/ A&P                             Medical Decision Making Amount and/or Complexity of Data  Reviewed Labs: ordered. Radiology: ordered.   77 yo F with a chief complaints of feeling fatigued.  She initially told me that this was just over the past couple days but then later tells me that she has been fatigued for a really long time.  She is not sure exactly what the cause of it is but it is show after some probing she thinks maybe it was due to the COVID-vaccine.  She denies any obvious infectious symptoms.  Has recently had her blood pressure medication reduced after it was low in the office.  Will give a bolus of IV fluids here.  Screen for infection.  Blood work.  On my record review she did have blood work done by her PCP, normal TSH, normal B vitamin levels.  No obvious electrolyte abnormality.  Lab work here consistent with lab work checked 48 hours ago.  UA negative for infection, no significant electrolyte abnormality, no anemia lactate normal, chest x-ray independently interpreted by me without focal infiltrate or pneumothorax.  10:25 PM:  I have discussed the diagnosis/risks/treatment options with the patient and family.  Evaluation and diagnostic testing in the emergency department does not suggest an emergent condition requiring admission or immediate intervention beyond what has been performed at this time.  They will follow up with PCP. We also discussed returning to the ED immediately if new or worsening sx occur. We discussed the sx which are most concerning (e.g., sudden worsening pain, fever, inability to tolerate by mouth) that necessitate immediate return. Medications administered to the patient during their visit and any new  prescriptions provided to the patient are listed below.  Medications given during this visit Medications  sodium chloride 0.9 % bolus 1,000 mL (0 mLs Intravenous Stopped 03/02/23 2200)     The patient appears reasonably screen and/or stabilized for discharge and I doubt any other medical condition or other [facility] requiring further screening, evaluation, or treatment in the ED at this time prior to discharge.          Final Clinical Impression(s) / ED Diagnoses Final diagnoses:  Hypotension, unspecified hypotension type    Rx / DC Orders ED Discharge Orders     None         Melene Plan, DO 03/02/23 2225

## 2023-03-03 DIAGNOSIS — G4733 Obstructive sleep apnea (adult) (pediatric): Secondary | ICD-10-CM | POA: Diagnosis not present

## 2023-03-04 ENCOUNTER — Telehealth: Payer: Self-pay | Admitting: Family Medicine

## 2023-03-04 ENCOUNTER — Other Ambulatory Visit: Payer: Self-pay | Admitting: General Practice

## 2023-03-04 DIAGNOSIS — E7800 Pure hypercholesterolemia, unspecified: Secondary | ICD-10-CM

## 2023-03-04 LAB — CULTURE, BLOOD (ROUTINE X 2)

## 2023-03-04 LAB — CBC WITH DIFFERENTIAL/PLATELET
MCH: 27.7 pg (ref 27.0–33.0)
Monocytes Relative: 5.3 %
Neutro Abs: 6133 cells/uL (ref 1500–7800)
RBC: 4.7 10*6/uL (ref 3.80–5.10)
RDW: 14.5 % (ref 11.0–15.0)

## 2023-03-04 LAB — COMPLETE METABOLIC PANEL WITH GFR
AG Ratio: 2.1 (calc) (ref 1.0–2.5)
ALT: 18 U/L (ref 6–29)
AST: 16 U/L (ref 10–35)
Albumin: 4.7 g/dL (ref 3.6–5.1)
Calcium: 10.7 mg/dL — ABNORMAL HIGH (ref 8.6–10.4)
Chloride: 101 mmol/L (ref 98–110)
Creat: 0.7 mg/dL (ref 0.60–1.00)
Globulin: 2.2 g/dL (calc) (ref 1.9–3.7)
Glucose, Bld: 172 mg/dL — ABNORMAL HIGH (ref 65–99)
Potassium: 4.6 mmol/L (ref 3.5–5.3)
Sodium: 139 mmol/L (ref 135–146)
Total Bilirubin: 1.3 mg/dL — ABNORMAL HIGH (ref 0.2–1.2)
Total Protein: 6.9 g/dL (ref 6.1–8.1)
eGFR: 90 mL/min/{1.73_m2} (ref >=60)

## 2023-03-04 LAB — IRON,TIBC AND FERRITIN PANEL: %SAT: 24 % (calc) (ref 16–45)

## 2023-03-04 LAB — VITAMIN B1: Vitamin B1 (Thiamine): 21 nmol/L (ref 8–30)

## 2023-03-04 LAB — VITAMIN B6: Vitamin B6: 41.4 ng/mL — ABNORMAL HIGH (ref 2.1–21.7)

## 2023-03-04 LAB — MAGNESIUM: Magnesium: 2 mg/dL (ref 1.5–2.5)

## 2023-03-04 NOTE — Telephone Encounter (Signed)
This call patient and see how she is feeling when I last saw her last week she was having low blood pressures I had asked her to cut her [name] in half.  I had also sent in 25 mg of metoprolol as well.  I want a make sure she actually picked that up.  I might even have her split that in half as well.

## 2023-03-05 ENCOUNTER — Other Ambulatory Visit: Payer: Self-pay

## 2023-03-05 DIAGNOSIS — E7800 Pure hypercholesterolemia, unspecified: Secondary | ICD-10-CM

## 2023-03-05 LAB — CULTURE, BLOOD (ROUTINE X 2)

## 2023-03-05 MED ORDER — ATORVASTATIN CALCIUM 40 MG PO TABS
40.0000 mg | ORAL_TABLET | Freq: Every day | ORAL | 6 refills | Status: DC
Start: 2023-03-05 — End: 2024-07-20

## 2023-03-05 NOTE — Telephone Encounter (Signed)
Jillian Huynh states she did cut in half the [place]. She is also cutting in half the Metoprolol 50 mg to equal 25 mg. She has not picked up the 25 mg tablets. She states the hospital had her decrease the Entresto to half in the am and half in the pm. She has a follow up with cardiology the first of May.

## 2023-03-06 ENCOUNTER — Telehealth: Payer: Self-pay

## 2023-03-06 NOTE — Telephone Encounter (Signed)
Patient called - states she is having difficulty getting metoprolol filled at pharmacy . Chart shows refill was sent on 02/28/23.  [facility] [employer] [facility] for luch until 2pm

## 2023-03-06 NOTE — Progress Notes (Signed)
Hi Jillian Huynh, your vitamin B6 looks great.  In fact your levels are high so if you are taking some extra you can decrease that.  Kidney function looks stable.  Calcium level is also a little bit high.  Liver enzymes are normal as well.  Vitamin B1 is normal.

## 2023-03-07 NOTE — Telephone Encounter (Signed)
Test, she needs to pick up the 25 mg.  So I had sent them a new prescription and asked them to cancel any old ones on file since she still had some old ones for 76 which was her last dose written by cardiology so just wanted to make sure there was not any confusion.  Some not sure why they put all of them on hold it does not really make sense but they just need to be making sure that they are filling the 25 mg medication.

## 2023-03-07 NOTE — Telephone Encounter (Signed)
Spoke with pharmacy and they do have metoprolol  succinate   ready for patient pick.up.

## 2023-03-07 NOTE — Progress Notes (Signed)
HPI: FU CHF. ABIs October 2022 normal. Echocardiogram November 2022 showed septal hypokinesis, ejection fraction 45 to 50%, moderate left ventricular hypertrophy, grade 1 diastolic dysfunction, moderate mitral regurgitation, mild aortic insufficiency. Cardiac CTA November 2022 showed calcium score 162 which was 70th percentile and minimal nonobstructive disease in the LAD and circumflex.  Patient was initiated on medical therapy.  Cardiac MRI August 2023 showed ejection fraction 49% with septal/lateral dyssynchrony consistent with left bundle branch block, normal RV function.  Echocardiogram repeated November 2023 and showed ejection fraction 50 to 55%, moderate left ventricular hypertrophy, grade 1 diastolic dysfunction, mild aortic insufficiency.  Patient seen in the emergency room recently with complaints of fatigue and low blood pressure.  Blood pressure documented at 108/59.  Chest x-ray showed no infiltrates, blood cultures negative, white blood cell count 7.7, hemoglobin 12.1, BUN 24 and creatinine 0.71.  Since last seen, there is no dyspnea, chest pain, palpitations or syncope.  She complains of significant fatigue.  Current Outpatient Medications  Medication Sig Dispense Refill   atorvastatin (LIPITOR) 40 MG tablet Take 1 tablet (40 mg total) by mouth daily. 30 tablet 6   B-D ULTRAFINE III SHORT PEN 31G X 8 MM MISC SUB-Q Daily 100 each 4   Calcium Carbonate-Vit D-Min (CALCIUM 1200 PO) Take 1 tablet by mouth in the morning and at bedtime.     Continuous Blood Gluc Receiver (FREESTYLE LIBRE 2 READER) DEVI 1 each by Does not apply route once. Used with sensors, obtained via DME     Continuous Blood Gluc Sensor (FREESTYLE LIBRE 2 SENSOR) MISC 1 Application by Does not apply route every 14 (fourteen) days. Patient obtains via DME     dapagliflozin propanediol (FARXIGA) 10 MG TABS tablet Take 1 tablet (10 mg total) by mouth daily before breakfast. 30 tablet 11   ezetimibe (ZETIA) 10 MG tablet  Take 1 tablet by mouth once daily 90 tablet 3   fish oil-omega-3 fatty acids 1000 MG capsule Take 1 g by mouth daily.     Insulin Degludec (TRESIBA [place]) Inject 50 Units into the skin daily.     metoprolol succinate (TOPROL XL) 25 MG 24 hr tablet Take 1 tablet (25 mg total) by mouth daily. 90 tablet 1   Multiple Vitamin (MULTIVITAMIN WITH MINERALS) TABS tablet Take 1 tablet by mouth daily.     omeprazole (PRILOSEC) 20 MG capsule Take 20 mg by mouth daily.     rOPINIRole (REQUIP) 4 MG tablet Take 1 tablet by mouth once daily 90 tablet 0   sacubitril-valsartan (ENTRESTO) 24-26 MG Take 1 tablet by mouth 2 (two) times daily. 60 tablet 11   Semaglutide (RYBELSUS) 14 MG TABS Take 1 tablet by mouth daily.     spironolactone (ALDACTONE) 25 MG tablet Take 1 tablet (25 mg total) by mouth daily. 90 tablet 3   No current facility-administered medications for this visit.     Past Medical History:  Diagnosis Date   Diabetes mellitus without complication (HCC)    Hyperlipidemia    Hypertension     Past Surgical History:  Procedure Laterality Date   CHOLECYSTECTOMY      Social History   Socioeconomic History   Marital status: Married    Spouse name: Social research officer, government   Number of children: 3   Years of education: 16.5   Highest education level: Some college, no degree  Occupational History   Occupation: Social worker    Comment: part-time  Tobacco Use   Smoking status: Never  Smokeless tobacco: Never  Vaping Use   Vaping Use: Never used  Substance and Sexual Activity   Alcohol use: No    Alcohol/week: 0.0 standard drinks of alcohol   Drug use: No   Sexual activity: Yes    Partners: Male  Other Topics Concern   Not on file  Social History Narrative   Lives with her husband. She is working full time as a Social worker. Two of her children live close by. One is in [place]. One of her daughter's is deceased. Her hobbies include watch tv, listening to music and reading.    Social  Determinants of Health   Financial Resource Strain: Low Risk  (09/19/2022)   Overall Financial Resource Strain (CARDIA)    Difficulty of Paying Living Expenses: Not hard at all  Food Insecurity: No Food Insecurity (09/19/2022)   Hunger Vital Sign    Worried About Running Out of Food in the Last Year: Never true    Ran Out of Food in the Last Year: Never true  Transportation Needs: No Transportation Needs (09/19/2022)   PRAPARE - Administrator, Civil Service (Medical): No    Lack of Transportation (Non-Medical): No  Physical Activity: Inactive (09/19/2022)   Exercise Vital Sign    Days of Exercise per Week: 0 days    Minutes of Exercise per Session: 0 min  Stress: No Stress Concern Present (07/22/2021)   [employer] of Occupational Health - Occupational Stress Questionnaire    Feeling of Stress : Not at all  Social Connections: Moderately Integrated (09/19/2022)   Social Connection and Isolation Panel [NHANES]    Frequency of Communication with Friends and Family: More than three times a week    Frequency of Social Gatherings with Friends and Family: More than three times a week    Attends Religious Services: Never    Database administrator or Organizations: Yes    Attends Engineer, structural: More than 4 times per year    Marital Status: Married  Catering manager Violence: Not At Risk (09/19/2022)   Humiliation, Afraid, Rape, and Kick questionnaire    Fear of Current or Ex-Partner: No    Emotionally Abused: No    Physically Abused: No    Sexually Abused: No    Family History  Problem Relation Age of Onset   Diabetes Father    Cancer Father 2       Esophogeal Ca., Lung.   Hypertension Other    Thyroid disease Other    Colon cancer Daughter 66   Glaucoma Mother    Hepatitis C Mother 93       Died from.   Glaucoma Sister    Glaucoma Sister    Glaucoma Maternal Aunt     ROS: no fevers or chills, productive cough, hemoptysis, dysphasia,  odynophagia, melena, hematochezia, dysuria, hematuria, rash, seizure activity, orthopnea, PND, pedal edema, claudication. Remaining systems are negative.  Physical Exam: Well-developed well-nourished in no acute distress.  Skin is warm and dry.  HEENT is normal.  Neck is supple.  Chest is clear to auscultation with normal expansion.  Cardiovascular exam is regular rate and rhythm.  Abdominal exam nontender or distended. No masses palpated. Extremities show no edema. neuro grossly intact    A/P  1 nonischemic cardiomyopathy-LV function has improved to low normal on most recent echocardiogram.  Previous reduction felt possibly secondary to left bundle branch block/dyssynchrony.  She did not have obstructive coronary disease on prior CTA.  Will  continue [place] C[place]ard and spironolactone.  She is complaining of severe fatigue.  She did have some fatigue prior to initiating any medications.  However I will discontinue Toprol to see if the beta-blocker is causing her symptoms.  If not she would like to consider changing Entresto to losartan to see if that is causing further symptoms.  2 chronic combined systolic/diastolic congestive heart failure-patient is euvolemic today.  Will continue Farxiga and spironolactone.  3 coronary artery disease-mild on previous CTA.  Continue statin.  4 aortic insufficiency-mild on most recent echocardiogram.  5 hypertension-blood pressure controlled.  Continue present medications and follow.  6 hyperlipidemia-continue statin.  7 obstructive sleep apnea-continue CPAP.  Olga Millers, MD

## 2023-03-08 ENCOUNTER — Telehealth: Payer: Self-pay | Admitting: *Deleted

## 2023-03-08 ENCOUNTER — Encounter: Payer: Self-pay | Admitting: Cardiology

## 2023-03-08 LAB — CULTURE, BLOOD (ROUTINE X 2)
Culture: NO GROWTH
Culture: NO GROWTH

## 2023-03-08 MED ORDER — SACUBITRIL-VALSARTAN 24-26 MG PO TABS: 1.0000 | ORAL_TABLET | Freq: Two times a day (BID) | ORAL | 11 refills | Status: DC

## 2023-03-08 NOTE — Telephone Encounter (Signed)
Spoke with pt son, patient went to see her medical doctor and her blood pressure was 90/45 and 70/35. They told her to increase her fluids and they cut her farxiga in 1/2. Then the patient ended up in the ER with blood pressures running 80/40 and the patient was not able to get off the couch. They gave her fluids and sent her home. She did hold her entresto and the blood pressure rose to 137/55, 139/57 and 140/58. She restarted the entresto at 1/2 tablet daily and now her blood pressure is 90/42. Discussed with dr Jens Som, patient instructed to hold entresto for 1 day and then restart at 24/26 mg twice daily. She has a follow up appointment next week with dr Jens Som. She will track her blood pressure and bring that list to the appointment. New script sent to the pharmacy

## 2023-03-11 ENCOUNTER — Other Ambulatory Visit (HOSPITAL_COMMUNITY): Payer: Self-pay | Admitting: Cardiology

## 2023-03-13 ENCOUNTER — Other Ambulatory Visit: Payer: Medicare HMO | Admitting: Pharmacist

## 2023-03-13 ENCOUNTER — Ambulatory Visit: Payer: Medicare HMO | Admitting: Cardiology

## 2023-03-13 ENCOUNTER — Encounter: Payer: Self-pay | Admitting: Cardiology

## 2023-03-13 VITALS — BP 110/62 | HR 75 | Ht 62.0 in | Wt 194.1 lb

## 2023-03-13 DIAGNOSIS — I2510 Atherosclerotic heart disease of native coronary artery without angina pectoris: Secondary | ICD-10-CM

## 2023-03-13 DIAGNOSIS — I447 Left bundle-branch block, unspecified: Secondary | ICD-10-CM

## 2023-03-13 DIAGNOSIS — E7800 Pure hypercholesterolemia, unspecified: Secondary | ICD-10-CM

## 2023-03-13 DIAGNOSIS — I10 Essential (primary) hypertension: Secondary | ICD-10-CM | POA: Diagnosis not present

## 2023-03-13 DIAGNOSIS — I429 Cardiomyopathy, unspecified: Secondary | ICD-10-CM

## 2023-03-13 NOTE — Patient Instructions (Signed)
Medication Instructions:   STOP METOPROLOL  *If you need a refill on your cardiac medications before your next appointment, please call your pharmacy*   Follow-Up: At [facility], you and your health needs are our priority.  As part of our continuing mission to provide you with exceptional heart care, we have created designated Provider Care Teams.  These Care Teams include your primary Cardiologist (physician) and Advanced Practice Providers (APPs -  Physician Assistants and Nurse Practitioners) who all work together to provide you with the care you need, when you need it.  We recommend signing up for the patient portal called "MyChart".  Sign up information is provided on this After Visit Summary.  MyChart is used to connect with patients for Virtual Visits (Telemedicine).  Patients are able to view lab/test results, encounter notes, upcoming appointments, etc.  Non-urgent messages can be sent to your provider as well.   To learn more about what you can do with MyChart, go to [email or web address].    Your next appointment:   3 month(s)  Provider:   Olga Millers, MD

## 2023-03-13 NOTE — Progress Notes (Signed)
03/13/2023 Name: Jillian Huynh MRN: [id number] DOB: 02-06-1946  Chief Complaint  Patient presents with   Diabetes   Medication Assistance    Jillian Huynh is a 77 y.o. year old female who presented for a telephone visit.   They were referred to the pharmacist by their PCP for assistance in managing diabetes and medication access.   Outreached patient to discuss approved patient assistance through novonordisk for ozempic, tresiba, and pen needles.  Patient states after recent issues with severe low blood pressure, medications are changing around, temporarily held entresto for a few days at direction of cardiologist. BP has been: 133/62, 115/61, 148/68. She has since restarted entresto and feeling poorly again.  Has follow up with cardiology this afternoon. Will touch base with her after this visit to follow up on their medication changes and support her through applying grant funding to any new changes in prescriptions.  Subjective:  Care Team: Primary Care Provider: Agapito Games, MD ; Next Scheduled Visit: 02/28/23 Cardiologist: Dr Jens Som; Next Scheduled Visit: 06/24/23  Medication Access/Adherence  Current Pharmacy:  [facility] - [place], [place] - [address] [place] [address] Phone: [phone] Fax: [phone]   Diabetes:  Current medications:  farxiga 10mg  daily, tresiba 60 units daily, rybelsus 14mg  daily  Current glucose readings: 190s Using Libre 2 meter; testing several times daily   Patient denies hypoglycemic s/sx including dizziness, shakiness, sweating. Patient reports hyperglycemic symptoms including polyuria, polydipsia, polyphagia, nocturia, neuropathy, blurred vision.  Current medication access support: ozempic, tresiba, pen needles via novonordisk, farxiga via AZ&Me   Objective:  Lab Results  Component Value Date   HGBA1C 9.5 (A) 02/28/2023    Lab Results  Component Value  Date   CREATININE 0.71 03/02/2023   BUN 24 (H) 03/02/2023   NA 133 (L) 03/02/2023   K 4.0 03/02/2023   CL 102 03/02/2023   CO2 22 03/02/2023    Lab Results  Component Value Date   CHOL 110 12/25/2021   HDL 40 (L) 12/25/2021   LDLCALC 44 12/25/2021   TRIG 188 (H) 12/25/2021   CHOLHDL 2.8 12/25/2021    Medications Reviewed Today     Reviewed by Lewayne Bunting, MD (Physician) on 03/13/23 at 1341  Med List Status: <None>   Medication Order Taking? Sig Documenting Provider Last Dose Status Informant  atorvastatin (LIPITOR) 40 MG tablet [id number] Yes Take 1 tablet (40 mg total) by mouth daily. Lewayne Bunting, MD Taking Active   B-D ULTRAFINE III SHORT PEN 31G X 8 MM MISC [id number] Yes SUB-Q Daily Agapito Games, MD Taking Active   Calcium Carbonate-Vit D-Min (CALCIUM 1200 PO) [id number] Yes Take 1 tablet by mouth in the morning and at bedtime. [provider] Taking Active            Med Note Suezanne Cheshire   Mon Feb 26, 2022 10:31 AM)    Continuous Blood Gluc Receiver (FREESTYLE LIBRE 2 READER) DEVI [id number] Yes 1 each by Does not apply route once. Used with sensors, obtained via DME [provider] Taking Active   Continuous Blood Gluc Sensor (FREESTYLE LIBRE 2 SENSOR) MISC [id number] Yes 1 Application by Does not apply route every 14 (fourteen) days. Patient obtains via DME [provider] Taking Active   dapagliflozin propanediol (FARXIGA) 10 MG TABS tablet [id number] Yes Take 1 tablet (10 mg total) by mouth daily before breakfast. Wende Mott, Anderson Malta, FNP Taking Active   ezetimibe (ZETIA) 10  MG tablet [id number] Yes Take 1 tablet by mouth once daily Lewayne Bunting, MD Taking Active   fish oil-omega-3 fatty acids 1000 MG capsule [id number] Yes Take 1 g by mouth daily. [provider] Taking Active   Insulin Degludec (TRESIBA [place]) [id number] Yes Inject 50 Units into the skin daily. [provider] Taking Active   metoprolol  succinate (TOPROL XL) 25 MG 24 hr tablet [id number] Yes Take 1 tablet (25 mg total) by mouth daily. Agapito Games, MD Taking Active   Multiple Vitamin (MULTIVITAMIN WITH MINERALS) TABS tablet [id number] Yes Take 1 tablet by mouth daily. [provider] Taking Active   omeprazole (PRILOSEC) 20 MG capsule [id number] Yes Take 20 mg by mouth daily. [provider] Taking Active   rOPINIRole (REQUIP) 4 MG tablet [id number] Yes Take 1 tablet by mouth once daily Agapito Games, MD Taking Active   sacubitril-valsartan [facility]) 24-26 MG [id number] Yes Take 1 tablet by mouth 2 (two) times daily. Lewayne Bunting, MD Taking Active   Semaglutide [employer]) 14 MG TABS [id number] Yes Take 1 tablet by mouth daily. [provider] Taking Active   spironolactone (ALDACTONE) 25 MG tablet [id number] Yes Take 1 tablet (25 mg total) by mouth daily. Lewayne Bunting, MD Taking Active              Assessment/Plan:   Diabetes: - Currently uncontrolled, working on food choices and medication adherence to improve glucose - Recommend to continue current medications, will change from rybelsus to ozempic at 2024 patient assistance renewal for higher potency to assist with glycemic control. Approved and awaiting medication supply delivered to office.  - Counseled patient that ozempic will REPLACE rybelsus. - Recommend to check glucose using Libre 2 - Screened patient for Medicare Extra Help eligibility, unfortunately does not qualify due to exceeding assets threshold.  Follow Up Plan: 1 day to review med changes after cardiologist  Lynnda Shields, PharmD Clinical Pharmacist [facility] [phone]

## 2023-03-14 ENCOUNTER — Other Ambulatory Visit: Payer: Medicare HMO | Admitting: Pharmacist

## 2023-03-14 NOTE — Progress Notes (Signed)
03/14/2023 Name: Jillian Huynh MRN: [id number] DOB: 1946-05-01  Chief Complaint  Patient presents with   Diabetes   Medication Assistance    Jillian Huynh is a 77 y.o. year old female who presented for a telephone visit.   They were referred to the pharmacist by their PCP for assistance in managing diabetes and medication access.   Of note, patient has been managing low blood pressures and fatigue. Patient arranged sooner follow up with cardiology, plan is to discontinue metoprolol until 06/24/23 appointment to see if this helps symptoms, and remain at lower dosage of entresto, 24-26mg  BID.  Subjective:  Care Team: Primary Care Provider: Agapito Games, MD ; Next Scheduled Visit: 02/28/23 Cardiologist: Dr Jens Som; Next Scheduled Visit: 06/24/23  Medication Access/Adherence  Current Pharmacy:  [facility] - [place], [place] - [address]  [place] [address] Phone: [phone] Fax: [phone]   Diabetes:  Current medications:  farxiga 10mg  daily, tresiba 60 units daily, rybelsus 14mg  daily  Current glucose readings: 190s Using Libre 2 meter; testing several times daily   Patient denies hypoglycemic s/sx including dizziness, shakiness, sweating. Patient reports hyperglycemic symptoms including polyuria, polydipsia, polyphagia, nocturia, neuropathy, blurred vision.  Current medication access support: ozempic, tresiba, pen needles via novonordisk, farxiga via AZ&Me   Objective:  Lab Results  Component Value Date   HGBA1C 9.5 (A) 02/28/2023    Lab Results  Component Value Date   CREATININE 0.71 03/02/2023   BUN 24 (H) 03/02/2023   NA 133 (L) 03/02/2023   K 4.0 03/02/2023   CL 102 03/02/2023   CO2 22 03/02/2023    Lab Results  Component Value Date   CHOL 110 12/25/2021   HDL 40 (L) 12/25/2021   LDLCALC 44 12/25/2021   TRIG 188 (H) 12/25/2021   CHOLHDL 2.8 12/25/2021    Medications Reviewed  Today     Reviewed by Gabriel Carina, RPH (Pharmacist) on 03/14/23 at 1621  Med List Status: <None>   Medication Order Taking? Sig Documenting Provider Last Dose Status Informant  atorvastatin (LIPITOR) 40 MG tablet [id number] Yes Take 1 tablet (40 mg total) by mouth daily. Lewayne Bunting, MD Taking Active   B-D ULTRAFINE III SHORT PEN 31G X 8 MM MISC [id number] Yes SUB-Q Daily Agapito Games, MD Taking Active   Calcium Carbonate-Vit D-Min (CALCIUM 1200 PO) [id number] Yes Take 1 tablet by mouth in the morning and at bedtime. [provider] Taking Active            Med Note Suezanne Cheshire   Mon Feb 26, 2022 10:31 AM)    Continuous Blood Gluc Receiver (FREESTYLE LIBRE 2 READER) DEVI [id number] Yes 1 each by Does not apply route once. Used with sensors, obtained via DME [provider] Taking Active   Continuous Blood Gluc Sensor (FREESTYLE LIBRE 2 SENSOR) MISC [id number] Yes 1 Application by Does not apply route every 14 (fourteen) days. Patient obtains via DME [provider] Taking Active   dapagliflozin propanediol (FARXIGA) 10 MG TABS tablet [id number] Yes Take 1 tablet (10 mg total) by mouth daily before breakfast. Jacklynn Ganong, FNP Taking Active   ezetimibe (ZETIA) 10 MG tablet [id number] Yes Take 1 tablet by mouth once daily Lewayne Bunting, MD Taking Active   fish oil-omega-3 fatty acids 1000 MG capsule [id number] Yes Take 1 g by mouth daily. [provider] Taking Active   Insulin Degludec (TRESIBA [place]) [id number] Yes Inject 50 Units into  the skin daily. [provider] Taking Active   Multiple Vitamin (MULTIVITAMIN WITH MINERALS) TABS tablet [id number] Yes Take 1 tablet by mouth daily. [provider] Taking Active   omeprazole (PRILOSEC) 20 MG capsule [id number] Yes Take 20 mg by mouth daily. [provider] Taking Active   rOPINIRole (REQUIP) 4 MG tablet [id number] Yes Take 1 tablet by mouth once daily Agapito Games, MD Taking Active   sacubitril-valsartan [facility]) 24-26 MG [id number] Yes Take 1 tablet by mouth 2 (two) times daily. Lewayne Bunting, MD Taking Active   Semaglutide [facility]) 14 MG TABS [id number] Yes Take 1 tablet by mouth daily. [provider] Taking Active   spironolactone (ALDACTONE) 25 MG tablet [id number] Yes Take 1 tablet (25 mg total) by mouth daily. Lewayne Bunting, MD Taking Active              Assessment/Plan:   Diabetes: - Currently uncontrolled, working on food choices and medication adherence to improve glucose - Recommend to continue current medications, will change from rybelsus to ozempic at 2024 patient assistance renewal for higher potency to assist with glycemic control. Approved and awaiting medication supply delivered to office. Will facilitate with RX Med assistance team to inquire update for medication shipment - Counseled patient that ozempic will REPLACE rybelsus. - Contacted patient's dispensing pharmacy to add new RX (lower dose) entresto to be covered by grant funding. Attempted x3, after long holds, [facility] phone line drops call. Will send patient mychart of the information to update her entresto cost at pharmacy. - Recommend to check glucose using Libre 2 - Screened patient for Medicare Extra Help eligibility, unfortunately does not qualify due to exceeding assets threshold.  Follow Up Plan: 2-3 weeks  Lynnda Shields, PharmD Clinical Pharmacist [facility] [phone]

## 2023-03-21 ENCOUNTER — Ambulatory Visit (INDEPENDENT_AMBULATORY_CARE_PROVIDER_SITE_OTHER): Payer: Medicare HMO | Admitting: Family Medicine

## 2023-03-21 ENCOUNTER — Encounter: Payer: Self-pay | Admitting: Family Medicine

## 2023-03-21 VITALS — BP 121/38 | HR 66 | Ht 62.0 in | Wt 191.0 lb

## 2023-03-21 DIAGNOSIS — E118 Type 2 diabetes mellitus with unspecified complications: Secondary | ICD-10-CM | POA: Diagnosis not present

## 2023-03-21 DIAGNOSIS — Z7984 Long term (current) use of oral hypoglycemic drugs: Secondary | ICD-10-CM | POA: Diagnosis not present

## 2023-03-21 DIAGNOSIS — I10 Essential (primary) hypertension: Secondary | ICD-10-CM | POA: Diagnosis not present

## 2023-03-21 DIAGNOSIS — Z794 Long term (current) use of insulin: Secondary | ICD-10-CM | POA: Diagnosis not present

## 2023-03-21 LAB — HM DIABETES EYE EXAM

## 2023-03-21 MED ORDER — BD PEN NEEDLE SHORT U/F 31G X 8 MM MISC
4 refills | Status: AC
Start: 2023-03-21 — End: ?

## 2023-03-21 NOTE — Assessment & Plan Note (Signed)
He is feeling much better now that her blood pressures are running between 101 120.  She brought in her home log diastolics are typically in the 41s though this morning her blood pressure was 83/37 at home and today here in the office blood pressure is 121/37.  Pulse typically in the 60s.  The discontinuation of metoprolol has been really helpful.  Continue with lower dose Entresto half a tab of the [place]

## 2023-03-21 NOTE — Assessment & Plan Note (Signed)
A1c was uncontrolled at prior visit she is still taking the Rybelsus but they are trying to get assistance for Ozempic so that we can get her switched over to get a little bit more A1c lowering.  Continue with half a tab of the 10 mg [name] because of low blood pressures.

## 2023-03-21 NOTE — Progress Notes (Signed)
Established Patient Office Visit  Subjective   Patient ID: Jillian Huynh, female    DOB: June 12, 1946  Age: 77 y.o. MRN: [id number]  Chief Complaint  Patient presents with   Hypertension   Fatigue   Peripheral Neuropathy    HPI  Today for follow-up for low blood pressure.  She was feeling very fatigued and was having low blood pressures.  At that time I had her cut her Jardiance in half.  She was already down to 25 mg of metoprolol but had accidentally taken a whole tab a couple of days.  She actually ended up going to the emergency department 2 days later on April 20 for low blood pressure.  Blood pressure was 108/59 there so they actually decreased her Entresto and told her to take half of that as well.  Diabetes-she has been splitting the [place] in half in part because of the diuretic effect.  Her last A1c was elevated and she has spoken to her clinical pharmacist about potentially switching to a GLP-1.    ROS    Objective:     BP (!) 121/38   Pulse 66   Ht 5\' 2"  (1.575 m)   Wt 191 lb (86.6 kg)   SpO2 99%   BMI 34.93 kg/m    Physical Exam Vitals and nursing note reviewed.  Constitutional:      Appearance: She is well-developed.  HENT:     Head: Normocephalic and atraumatic.  Cardiovascular:     Rate and Rhythm: Normal rate and regular rhythm.     Heart sounds: Normal heart sounds.  Pulmonary:     Effort: Pulmonary effort is normal.     Breath sounds: Normal breath sounds.  Skin:    General: Skin is warm and dry.  Neurological:     Mental Status: She is alert and oriented to person, place, and time.  Psychiatric:        Behavior: Behavior normal.      No results found for any visits on 03/21/23.    The ASCVD Risk score (Arnett DK, et al., 2019) failed to calculate for the following reasons:   The valid total cholesterol range is 130 to 320 mg/dL    Assessment & Plan:   Problem List Items Addressed This Visit       Cardiovascular and Mediastinum    BP (high blood pressure)    He is feeling much better now that her blood pressures are running between 101 120.  She brought in her home log diastolics are typically in the 51s though this morning her blood pressure was 83/37 at home and today here in the office blood pressure is 121/37.  Pulse typically in the 60s.  The discontinuation of metoprolol has been really helpful.  Continue with lower dose Entresto half a tab of the [place]        Endocrine   Diabetes mellitus type 2, controlled (HCC) - Primary    A1c was uncontrolled at prior visit she is still taking the Rybelsus but they are trying to get assistance for Ozempic so that we can get her switched over to get a little bit more A1c lowering.  Continue with half a tab of the 10 mg [name] because of low blood pressures.      Relevant Medications   B-D ULTRAFINE III SHORT PEN 31G X 8 MM MISC   Working to get her to be seen today for retinopathy screening.  She actually had a diabetic eye exam  scheduled in the had to cancel her appointment and pressure out a couple of months.  Return in about 2 months (around 05/21/2023) for Diabetes follow-up.    Nani Gasser, MD

## 2023-03-25 DIAGNOSIS — G4733 Obstructive sleep apnea (adult) (pediatric): Secondary | ICD-10-CM | POA: Diagnosis not present

## 2023-03-26 ENCOUNTER — Other Ambulatory Visit: Payer: Self-pay | Admitting: Family Medicine

## 2023-03-26 ENCOUNTER — Telehealth: Payer: Self-pay | Admitting: Family Medicine

## 2023-03-26 NOTE — Telephone Encounter (Signed)
Call patient: Eye exam was negative for retinopathy.  This is great news.  Recommend repeat in 1 year.

## 2023-03-27 ENCOUNTER — Other Ambulatory Visit: Payer: Medicare HMO | Admitting: Pharmacist

## 2023-03-27 NOTE — Telephone Encounter (Signed)
Patient advised of results and recommendations.  

## 2023-03-27 NOTE — Patient Instructions (Signed)
Doylene Bode chatting with you today. As discussed, I have submitted an order to the medical supply company for [place] 3 sensors + reader device. I also am checking on the status of the shipment for insulin and ozempic from the manufacturer patient assistance program.  I will call you when I have updates - but if you have questions or need me sooner, you can reach me at [phone].  Take care, Elmarie Shiley, PharmD, BCPS Clinical Pharmacist [facility]

## 2023-03-27 NOTE — Progress Notes (Signed)
03/27/2023 Name: Jillian Huynh MRN: [id number] DOB: 19-Feb-1946  Chief Complaint  Patient presents with   Diabetes   Medication Assistance    Jillian Huynh is a 77 y.o. year old female who presented for a telephone visit.   They were referred to the pharmacist by their PCP for assistance in managing diabetes and medication access.  Patient has taken her card to the pharmacy for healthwell to apply to the new entresto dose. She reports feeling better off of metoprolol. She has implemented this change as well as taking half tablet of farxiga to equal dose of 5mg  daily. Her blood pressure yesterday was 130/55.   --> libre 3 submit to parachute --> shipment tracking for novo (ozempic, needles, tresiba)  Subjective:  Care Team: Primary Care Provider: Agapito Games, MD ; Next Scheduled Visit: 02/28/23 Cardiologist: Dr Jillian Huynh; Next Scheduled Visit: 06/24/23  Medication Access/Adherence  Current Pharmacy:  [facility] - [place], [place] - [address] [place] [address] Phone: [phone] Fax: [phone]   Diabetes:  Current medications:  farxiga 5mg  daily, tresiba 60 units daily, rybelsus 14mg  daily  Current glucose readings: 190s Using Libre 2 meter with reader (not phone); testing several times daily  Patient denies hypoglycemic s/sx including dizziness, shakiness, sweating. Patient reports hyperglycemic symptoms including polyuria, polydipsia, polyphagia, nocturia, neuropathy, blurred vision.  Current medication access support: ozempic, tresiba, pen needles via novonordisk, farxiga via AZ&Me. Also has healthwell grant for cardiology medications   Objective:  Lab Results  Component Value Date   HGBA1C 9.5 (A) 02/28/2023    Lab Results  Component Value Date   CREATININE 0.71 03/02/2023   BUN 24 (H) 03/02/2023   NA 133 (L) 03/02/2023   K 4.0 03/02/2023   CL 102 03/02/2023   CO2 22 03/02/2023     Lab Results  Component Value Date   CHOL 110 12/25/2021   HDL 40 (L) 12/25/2021   LDLCALC 44 12/25/2021   TRIG 188 (H) 12/25/2021   CHOLHDL 2.8 12/25/2021    Medications Reviewed Today     Reviewed by Jillian Huynh, RPH (Pharmacist) on 03/27/23 at 1021  Med List Status: <None>   Medication Order Taking? Sig Documenting Provider Last Dose Status Informant  atorvastatin (LIPITOR) 40 MG tablet [id number] Yes Take 1 tablet (40 mg total) by mouth daily. Jillian Bunting, MD Taking Active   B-D ULTRAFINE III SHORT PEN 31G X 8 MM MISC [id number] Yes SUB-Q Daily Jillian Games, MD Taking Active   Calcium Carbonate-Vit D-Min (CALCIUM 1200 PO) [id number] Yes Take 1 tablet by mouth in the morning and at bedtime. [provider] Taking Active            Med Note Jillian Huynh   Mon Feb 26, 2022 10:31 AM)    Continuous Blood Gluc Receiver (FREESTYLE LIBRE 2 READER) DEVI [id number] Yes 1 each by Does not apply route once. Used with sensors, obtained via DME [provider] Taking Active   Continuous Blood Gluc Sensor (FREESTYLE LIBRE 2 SENSOR) MISC [id number] Yes 1 Application by Does not apply route every 14 (fourteen) days. Patient obtains via DME [provider] Taking Active   dapagliflozin propanediol (FARXIGA) 10 MG TABS tablet [id number] Yes Take 1 tablet (10 mg total) by mouth daily before breakfast. Jillian Ganong, FNP Taking Active   ezetimibe (ZETIA) 10 MG tablet [id number] Yes Take 1 tablet by mouth once daily Huynh, Jillian Frieze, MD Taking Active  fish oil-omega-3 fatty acids 1000 MG capsule [id number] Yes Take 1 g by mouth daily. [provider] Taking Active   Insulin Degludec (TRESIBA Jillian Huynh) [id number] Yes Inject 50 Units into the skin daily. [provider] Taking Active   Multiple Vitamin (MULTIVITAMIN WITH MINERALS) TABS tablet [id number] Yes Take 1 tablet by mouth daily. [provider] Taking Active   omeprazole  (PRILOSEC) 20 MG capsule [id number] Yes Take 20 mg by mouth daily. [provider] Taking Active   rOPINIRole (REQUIP) 4 MG tablet [id number] Yes Take 1 tablet by mouth once daily Jillian Games, MD Taking Active   sacubitril-valsartan [facility]) 24-26 MG [id number] Yes Take 1 tablet by mouth 2 (two) times daily. Jillian Bunting, MD Taking Active   Semaglutide [facility]) 14 MG TABS [id number] Yes Take 1 tablet by mouth daily. [provider] Taking Active   spironolactone (ALDACTONE) 25 MG tablet [id number] Yes Take 1 tablet (25 mg total) by mouth daily. Jillian Bunting, MD Taking Active              Assessment/Plan:   Diabetes: - Currently uncontrolled, working on food choices and medication adherence to improve glucose - Recommend to continue current medications, will change from rybelsus to ozempic at 2024 patient assistance renewal for higher potency to assist with glycemic control. Approved and awaiting medication supply delivered to office. Will facilitate with RX Med assistance team to inquire update for medication shipment - Counseled patient that ozempic will REPLACE rybelsus. - Recommend to check glucose using Libre 2 for now, submitting order to DME supply for libre 3 sensor + reader  - Screened patient for Medicare Extra Help eligibility, unfortunately does not qualify due to exceeding assets threshold.  Follow Up Plan: 2-3 weeks  Lynnda Shields, PharmD Clinical Pharmacist [facility] At [facility] [phone]

## 2023-03-29 ENCOUNTER — Other Ambulatory Visit: Payer: Medicare HMO | Admitting: Pharmacist

## 2023-03-29 NOTE — Progress Notes (Signed)
Care Coordination Call  Notified patient that her novonordisk shipment of insulin, ozempic, pen needles is in the process of shipping. Will send mychart of phone number to contact in 1-2 business days for tracking status. Patient verbalized understanding.  Lynnda Shields, PharmD, BCPS Clinical Pharmacist [facility]

## 2023-04-01 DIAGNOSIS — E119 Type 2 diabetes mellitus without complications: Secondary | ICD-10-CM | POA: Diagnosis not present

## 2023-04-01 DIAGNOSIS — Z794 Long term (current) use of insulin: Secondary | ICD-10-CM | POA: Diagnosis not present

## 2023-04-01 DIAGNOSIS — H524 Presbyopia: Secondary | ICD-10-CM | POA: Diagnosis not present

## 2023-04-01 DIAGNOSIS — H25813 Combined forms of age-related cataract, bilateral: Secondary | ICD-10-CM | POA: Diagnosis not present

## 2023-04-02 DIAGNOSIS — G4733 Obstructive sleep apnea (adult) (pediatric): Secondary | ICD-10-CM | POA: Diagnosis not present

## 2023-04-03 ENCOUNTER — Other Ambulatory Visit: Payer: Medicare HMO | Admitting: Pharmacist

## 2023-04-03 ENCOUNTER — Telehealth: Payer: Self-pay | Admitting: Family Medicine

## 2023-04-03 NOTE — Telephone Encounter (Signed)
Forwarding to [place] as an FYI.  Jillian Huynh)  [employer] PAP shipment for [employer] 2 mg dose (2 boxes) & Tresiba 100u (4 boxes) received this morning. Please contact the patient to come and pick up their orders today. Placed in the fridge with patient identifier. Thanks in advance.   Ozempic NDC:0169-4181-13  LOT: [id number] EXP: 2024-09-11  Jillian Huynh NDC: 9562-1308-65 LOT: [id number] EXP: 2025-04-11

## 2023-04-03 NOTE — Progress Notes (Signed)
04/03/2023 Name: Jillian Huynh MRN: [id number] DOB: 09-30-1946  Chief Complaint  Patient presents with   Diabetes   Medication Assistance    Kasy Doke is a 77 y.o. year old female who presented for a telephone visit.   They were referred to the pharmacist by their PCP for assistance in managing diabetes and medication access.  Patient has taken her card to the pharmacy for healthwell to apply to the new entresto dose. She reports feeling better off of metoprolol. She has implemented this change as well as taking half tablet of farxiga to equal dose of 5mg  daily.   --> libre 3 CGM order for glucose sensors have been submitted to parachute - contact [employer] if any issues/concerns at [phone]   Subjective:  Care Team: Primary Care Provider: Agapito Games, MD ; Next Scheduled Visit: 02/28/23 Cardiologist: Dr Jens Som; Next Scheduled Visit: 06/24/23  Medication Access/Adherence  Current Pharmacy:  [facility] - [place], [place] - [address] [place] [address] Phone: [phone] Fax: [phone]   Diabetes:  Current medications:  farxiga 5mg  daily, tresiba 60 units daily, rybelsus 14mg  daily. Patient assistance for tresiba & ozempic arrived at PCP office on 04/03/23, notified patient for pickup. Counseled patient on STOPPING rybelsus 14mg  daily oral tablets, and STARTING ozempic 0.5mg  weekly injection. Counseled on injection technique.   Current glucose readings: 190s Using Libre 2 meter with reader (not phone); testing several times daily  Patient denies hypoglycemic s/sx including dizziness, shakiness, sweating. Patient reports hyperglycemic symptoms including polyuria, polydipsia, polyphagia, nocturia, neuropathy, blurred vision.  Current medication access support: ozempic, tresiba, pen needles via novonordisk, farxiga via AZ&Me. Also has healthwell grant for cardiology  medications   Objective:  Lab Results  Component Value Date   HGBA1C 9.5 (A) 02/28/2023    Lab Results  Component Value Date   CREATININE 0.71 03/02/2023   BUN 24 (H) 03/02/2023   NA 133 (L) 03/02/2023   K 4.0 03/02/2023   CL 102 03/02/2023   CO2 22 03/02/2023    Lab Results  Component Value Date   CHOL 110 12/25/2021   HDL 40 (L) 12/25/2021   LDLCALC 44 12/25/2021   TRIG 188 (H) 12/25/2021   CHOLHDL 2.8 12/25/2021    Medications Reviewed Today     Reviewed by Gabriel Carina, RPH (Pharmacist) on 03/27/23 at 1021  Med List Status: <None>   Medication Order Taking? Sig Documenting Provider Last Dose Status Informant  atorvastatin (LIPITOR) 40 MG tablet [id number] Yes Take 1 tablet (40 mg total) by mouth daily. Lewayne Bunting, MD Taking Active   B-D ULTRAFINE III SHORT PEN 31G X 8 MM MISC [id number] Yes SUB-Q Daily Agapito Games, MD Taking Active   Calcium Carbonate-Vit D-Min (CALCIUM 1200 PO) [id number] Yes Take 1 tablet by mouth in the morning and at bedtime. [provider] Taking Active            Med Note Suezanne Cheshire   Mon Feb 26, 2022 10:31 AM)    Continuous Blood Gluc Receiver (FREESTYLE LIBRE 2 READER) DEVI [id number] Yes 1 each by Does not apply route once. Used with sensors, obtained via DME [provider] Taking Active   Continuous Blood Gluc Sensor (FREESTYLE LIBRE 2 SENSOR) MISC [id number] Yes 1 Application by Does not apply route every 14 (fourteen) days. Patient obtains via DME [provider] Taking Active   dapagliflozin propanediol (FARXIGA) 10 MG TABS tablet [id number]  Yes Take 1 tablet (10 mg total) by mouth daily before breakfast. Jacklynn Ganong, FNP Taking Active   ezetimibe (ZETIA) 10 MG tablet [id number] Yes Take 1 tablet by mouth once daily Lewayne Bunting, MD Taking Active   fish oil-omega-3 fatty acids 1000 MG capsule [id number] Yes Take 1 g by mouth daily. [provider] Taking Active    Insulin Degludec (TRESIBA [place]) [id number] Yes Inject 50 Units into the skin daily. [provider] Taking Active   Multiple Vitamin (MULTIVITAMIN WITH MINERALS) TABS tablet [id number] Yes Take 1 tablet by mouth daily. [provider] Taking Active   omeprazole (PRILOSEC) 20 MG capsule [id number] Yes Take 20 mg by mouth daily. [provider] Taking Active   rOPINIRole (REQUIP) 4 MG tablet [id number] Yes Take 1 tablet by mouth once daily Agapito Games, MD Taking Active   sacubitril-valsartan [facility]) 24-26 MG [id number] Yes Take 1 tablet by mouth 2 (two) times daily. Lewayne Bunting, MD Taking Active   Semaglutide [facility]) 14 MG TABS [id number] Yes Take 1 tablet by mouth daily. [provider] Taking Active   spironolactone (ALDACTONE) 25 MG tablet [id number] Yes Take 1 tablet (25 mg total) by mouth daily. Lewayne Bunting, MD Taking Active              Assessment/Plan:   Diabetes: - Currently uncontrolled, working on food choices and medication adherence to improve glucose - Patient assistance for tresiba & ozempic arrived at PCP office on 04/03/23, notified patient for pickup. Counseled patient on STOPPING rybelsus 14mg  daily oral tablets, and STARTING ozempic 0.5mg  weekly injection. Counseled on injection technique. Patient verbalized understanding. - Recommend to check glucose using Libre 2 for now, order is in process with [employer] for libre 3 sensor + reader  - Screened patient for Medicare Extra Help eligibility, unfortunately does not qualify due to exceeding assets threshold.  Follow Up Plan: 1-2 months to assess transition from rybelsus to ozempic, titrate ozempic dosing for improved glycemic control.  Lynnda Shields, PharmD Clinical Pharmacist [facility] At [facility] [phone]

## 2023-04-03 NOTE — Patient Instructions (Signed)
Harriett Sine,  As discussed, STOP rybelsus 14mg  tablets, and replace with ozempic 0.5mg  weekly injection.  If you have questions while I am out on maternity leave - you can reach a covering pharmacist at [phone].  Take care, Elmarie Shiley, PharmD, BCPS Clinical Pharmacist [facility]

## 2023-04-03 NOTE — Telephone Encounter (Signed)
Patient called requesting a refill of ;  Senag[place]ude (RYBELSUS 14mg  tabs   Pharmacy ;  [facility] [address] [place]

## 2023-04-09 ENCOUNTER — Other Ambulatory Visit: Payer: Self-pay | Admitting: Family Medicine

## 2023-04-16 DIAGNOSIS — E119 Type 2 diabetes mellitus without complications: Secondary | ICD-10-CM | POA: Diagnosis not present

## 2023-04-22 DIAGNOSIS — E118 Type 2 diabetes mellitus with unspecified complications: Secondary | ICD-10-CM | POA: Diagnosis not present

## 2023-04-22 DIAGNOSIS — Z794 Long term (current) use of insulin: Secondary | ICD-10-CM | POA: Diagnosis not present

## 2023-04-25 DIAGNOSIS — G4733 Obstructive sleep apnea (adult) (pediatric): Secondary | ICD-10-CM | POA: Diagnosis not present

## 2023-04-26 ENCOUNTER — Encounter: Payer: Self-pay | Admitting: Family Medicine

## 2023-05-03 ENCOUNTER — Telehealth: Payer: Self-pay

## 2023-05-03 NOTE — Progress Notes (Signed)
   05/03/2023  Patient ID: Jillian Huynh, female   DOB: 28-Jan-1946, 77 y.o.   MRN: [id number]  S/O: Patient outreach to check on transition from Rybelsus 14mg  daily to Ozempic 0.5mg  weekly.   A/P:  DM -Patient has stopped Rybelsus and is taking Ozempic, stating she was told to dial the pen "six clicks."  Upon further discussion, patient was not even dialing to 0.25mg  dose.   -She had a very small dose of Ozempic at the beginning of the week, and I recommended that she administer a 0.25mg  dose today.  She will take another 0.25mg  dose next Friday, and I follow up with her Tuesday 7/2 to assess ability to increase dose to 0.5mg  weekly -States BG has been in the 200's -Of note, she was instructed by cardiology to decrease Farxiga 10mg  tablet to one-half tablet recently due to hypotension.  Tablets were crumbling and since BG elevated, patient resumed taking full 10mg  tablet  Hypotension -States BP today has been 100/40-50, but does not endorse s/sx of hypotension -Spironolactone was also recently stopped by cardiology -Patient will continue to monitor BP daily and contact office if consistently <100/60 -May need to consider changing order to 5mg  tablet once daily or stopping [place] all together  Follow-up:  7/2 telephone visit  [name], PharmD, DPLA

## 2023-05-14 ENCOUNTER — Other Ambulatory Visit: Payer: Medicare HMO

## 2023-05-14 NOTE — Progress Notes (Unsigned)
   05/14/2023  Patient ID: Jillian Huynh, female   DOB: 1946/04/02, 77 y.o.   MRN: [id number]  S/O: Telephone visit to see how patient is tolerating Ozempic and evaluate increasing dose to 0.5mg  weekly  DM -Current medications:  Farxiga 10mg  daily, Tresiba 60 units daily, Ozempic 0.25mg  weekly -Patient has now taken two full doses of 0.25mg  weekly for 2 weeks and endorses she is tolerating well with no adverse effects -States FBG 130-140 -She continues to take [place] 10mg  full tablet daily and wishes to continue at this dose due to noticeable difference in BG control.  States BP has been normal, with few readings <100/60.  She does not endorse any dizziness/lightheadedness above baseline. -Using [facility] 2 for CGM  A/P:  DM -Patient will increase to 0.5mg  weekly starting this Friday 7/3; she will decrease Tresiba by 10% to inject 54 units daily at this time -Continue to monitor BP while taking [place] 10mg  daily; if consistently hypotensive, we can change to 5mg  daily.  Will likely gain better glucose control as we titrate Ozempic over the next several weeks/months.  Follow-up:  Patient sees Dr. Linford Arnold 7/9, and I will follow-up with her in 2-4 weeks to evaluate further increasing Ozempic and decreasing Candis Shine, PharmD, DPLA    Inc to 0.5mg  for Friday's dose and decrease Tresiba to 54 units Sees Dr. Linford Arnold 7/9

## 2023-05-16 DIAGNOSIS — E119 Type 2 diabetes mellitus without complications: Secondary | ICD-10-CM | POA: Diagnosis not present

## 2023-05-21 ENCOUNTER — Encounter: Payer: Self-pay | Admitting: Family Medicine

## 2023-05-21 ENCOUNTER — Ambulatory Visit (INDEPENDENT_AMBULATORY_CARE_PROVIDER_SITE_OTHER): Payer: Medicare HMO | Admitting: Family Medicine

## 2023-05-21 VITALS — BP 101/46 | HR 74 | Ht 62.0 in | Wt 190.0 lb

## 2023-05-21 DIAGNOSIS — I10 Essential (primary) hypertension: Secondary | ICD-10-CM | POA: Diagnosis not present

## 2023-05-21 DIAGNOSIS — I5022 Chronic systolic (congestive) heart failure: Secondary | ICD-10-CM

## 2023-05-21 DIAGNOSIS — Z794 Long term (current) use of insulin: Secondary | ICD-10-CM | POA: Diagnosis not present

## 2023-05-21 DIAGNOSIS — E118 Type 2 diabetes mellitus with unspecified complications: Secondary | ICD-10-CM

## 2023-05-21 DIAGNOSIS — L0291 Cutaneous abscess, unspecified: Secondary | ICD-10-CM

## 2023-05-21 LAB — POCT GLYCOSYLATED HEMOGLOBIN (HGB A1C): Hemoglobin A1C: 9.3 % — AB (ref 4.0–5.6)

## 2023-05-21 MED ORDER — MUPIROCIN 2 % EX OINT: TOPICAL_OINTMENT | Freq: Two times a day (BID) | CUTANEOUS | 0 refills | Status: DC

## 2023-05-21 NOTE — Assessment & Plan Note (Signed)
BP is low today and she is still feeling really tired.  BP was up a little last time.  Need to monitor carefully especially if she starts losing weight on the semaglutide if she goes up on the dose.

## 2023-05-21 NOTE — Assessment & Plan Note (Signed)
No sign of volume overload.  Continue Entresto.

## 2023-05-21 NOTE — Assessment & Plan Note (Signed)
She just gave her for 0.5 mg semaglutide injection about 4 days ago.  She is actually done really well so far she has not had any nausea or other new symptoms or side effects.  She does have chronic constipation so we did discuss just being proactive in regard to the constipation which could worsen as she is gradually increasing her dose.  If she is doing well then the plan will be to go up to 1 mg in 1 month.  But if we need to stay at the point 5 little longer we certainly can that is very reasonable.  We did discuss that if her blood sugars are more consistently under 150 then we can decrease her Tresiba to 35 units.  She is currently down to 50 units.  A1c came down just slightly but not a big change.  I think when she gets to 1 mg will notice a big shift in her A1c.  I like to see her back in 6 weeks

## 2023-05-21 NOTE — Progress Notes (Addendum)
Established Patient Office Visit  Subjective   Patient ID: Jillian Huynh, female    DOB: 1945/12/07  Age: 77 y.o. MRN: [id number]  Chief Complaint  Patient presents with   Diabetes    HPI Diabetes - no hypoglycemic events. No wounds or sores that are not healing well. No increased thirst or urination. Checking glucose at home with continuous glucose monitor. She is using it consistently and getting great feedback about her food choices by using the meter. . Taking medications as prescribed without any side effects. She has been on 0.5mg  for 3 weeks.  Now on 50 units of Tresiba daily.  Hypertension- Pt denies chest pain, SOB, dizziness, or heart palpitations.  Taking meds as directed w/o problems.  Denies medication side effects.    He also has a bump on her right upper back that her husband had noticed that she wanted me to look at today.  She says it has not been bothersome or painful.  No drainage.    ROS    Objective:     BP (!) 101/46   Pulse 74   Ht 5\' 2"  (1.575 m)   Wt 190 lb (86.2 kg)   SpO2 97%   BMI 34.75 kg/m    Physical Exam Vitals and nursing note reviewed.  Constitutional:      Appearance: She is well-developed.  HENT:     Head: Normocephalic and atraumatic.  Cardiovascular:     Rate and Rhythm: Normal rate and regular rhythm.     Heart sounds: Normal heart sounds.  Pulmonary:     Effort: Pulmonary effort is normal.     Breath sounds: Normal breath sounds.  Skin:    General: Skin is warm and dry.  Neurological:     Mental Status: She is alert and oriented to person, place, and time.  Psychiatric:        Behavior: Behavior normal.      Results for orders placed or performed in visit on 05/21/23  POCT glycosylated hemoglobin (Hb A1C)  Result Value Ref Range   Hemoglobin A1C 9.3 (A) 4.0 - 5.6 %   HbA1c POC (<> result, manual entry)     HbA1c, POC (prediabetic range)     HbA1c, POC (controlled diabetic range)        The ASCVD Risk score  (Arnett DK, et al., 2019) failed to calculate for the following reasons:   The valid total cholesterol range is 130 to 320 mg/dL    Assessment & Plan:   Problem List Items Addressed This Visit       Cardiovascular and Mediastinum   Systolic CHF (HCC)    No sign of volume overload.  Continue Entresto.        BP (high blood pressure) - Primary    BP is low today and she is still feeling really tired.  BP was up a little last time.  Need to monitor carefully especially if she starts losing weight on the semaglutide if she goes up on the dose.        Endocrine   Diabetes mellitus type 2, controlled (HCC)    She just gave her for 0.5 mg semaglutide injection about 4 days ago.  She is actually done really well so far she has not had any nausea or other new symptoms or side effects.  She does have chronic constipation so we did discuss just being proactive in regard to the constipation which could worsen as she is gradually increasing her  dose.  If she is doing well then the plan will be to go up to 1 mg in 1 month.  But if we need to stay at the point 5 little longer we certainly can that is very reasonable.  We did discuss that if her blood sugars are more consistently under 150 then we can decrease her Tresiba to 35 units.  She is currently down to 50 units.  A1c came down just slightly but not a big change.  I think when she gets to 1 mg will notice a big shift in her A1c.  I like to see her back in 6 weeks      Relevant Orders   POCT glycosylated hemoglobin (Hb A1C) (Completed)   Other Visit Diagnoses     Abscess          She has a small pustule on her right upper back.  I did clean the area with alcohol and then just use a flat blade to remove the surface and expressed a little bit of the content.  Prescription sent for mupirocin ointment to apply to the skin a couple times a day for the next week.  If is not continuing to heal then please let me know.  Return in about 6 weeks  (around 07/02/2023) for Diabetes follow-up.    Nani Gasser, MD

## 2023-05-22 DIAGNOSIS — Z794 Long term (current) use of insulin: Secondary | ICD-10-CM | POA: Diagnosis not present

## 2023-05-22 DIAGNOSIS — E118 Type 2 diabetes mellitus with unspecified complications: Secondary | ICD-10-CM | POA: Diagnosis not present

## 2023-05-25 DIAGNOSIS — G4733 Obstructive sleep apnea (adult) (pediatric): Secondary | ICD-10-CM | POA: Diagnosis not present

## 2023-05-27 ENCOUNTER — Telehealth: Payer: Self-pay

## 2023-05-27 NOTE — Progress Notes (Signed)
error 

## 2023-05-27 NOTE — Progress Notes (Signed)
Patient called stating that her sugars are dropping below 50 and she is very concerned.  Please advise   Thank you, Penne Lash, RMA Care Guide [facility]  [place], [place] [address] Direct Dial: [phone] [email or web address]

## 2023-05-28 ENCOUNTER — Other Ambulatory Visit: Payer: Medicare HMO

## 2023-06-03 MED ORDER — DAPAGLIFLOZIN PROPANEDIOL 5 MG PO TABS: 5.0000 mg | ORAL_TABLET | Freq: Every day | ORAL | 2 refills | Status: DC

## 2023-06-03 NOTE — Telephone Encounter (Signed)
Medical screening examination/treatment was performed by qualified clinical staff member and as supervising physician I was immediately available for consultation/collaboration. I have reviewed documentation and agree with assessment and plan.  Catherine Metheney, MD  

## 2023-06-03 NOTE — Progress Notes (Signed)
   05/27/23  Patient ID: Jillian Huynh, female   DOB: 06-09-1946, 77 y.o.   MRN: [id number]  S/O Patient outreach to return call to PCP office regarding medication concern(s).  Diabetes Management Plan -Ms. Lippens states she has not used [place] in 3 days due to hypoglycemia 4 nights ago (BG in the 50's) -She states this morning her FBG was 68 -She has continued to take [place] 10mg  daily and Ozempic 0.5mg  weekly -She has had two doses of Ozempic now and will be due for her 3rd dose Friday  A/P  Diabetes Management Plan -Patient was previously advised to take one-half tablet of Farxiga 10mg  due to hypotension, but she has been taking a whole tablet due to difficulty splitting.  Order pending for Farxiga 5mg  daily to help prevent hypoglycemia, hypotension, and need to split tablets. -Instructed patient to continue to hold Tresiba until FBG normalizes (80-130) -Continue Ozempic 0.5mg  weekly and use of CGM  Follow-up:  Telephone follow-up scheduled for 7/29  [name], PharmD, DPLA

## 2023-06-03 NOTE — Telephone Encounter (Signed)
Meds ordered this encounter  Medications   dapagliflozin propanediol (FARXIGA) 5 MG TABS tablet    Sig: Take 1 tablet (5 mg total) by mouth daily before breakfast.    Dispense:  90 tablet    Refill:  2

## 2023-06-03 NOTE — Telephone Encounter (Signed)
Chris with [employer] called. Diabetes order made through their program will arrive via [employer] in 24-48 hours, Tracking # [id number]

## 2023-06-04 ENCOUNTER — Telehealth: Payer: Self-pay

## 2023-06-04 NOTE — Telephone Encounter (Signed)
Pt advised.

## 2023-06-04 NOTE — Telephone Encounter (Signed)
Forwarding to [place] as an Financial planner.  [employer] PAP shipment for Ozempic 0.25/0.5 mg dose / 2 boxes and Tresiba 100 u dose / 4 boxes received this morning. Please contact the patient to come and pick up their order today. Placed in the PAP fridge with patient identifier. Thanks in advance.   Ozempic  NDC: [id number] LOT: [id number] EXP: 2025-01-09  Jillian Huynh   NDC: 3875-6433-29 LOT: [id number] EXP: 2025-09-11

## 2023-06-10 ENCOUNTER — Telehealth: Payer: Self-pay | Admitting: Family Medicine

## 2023-06-10 ENCOUNTER — Other Ambulatory Visit: Payer: Medicare HMO

## 2023-06-10 NOTE — Progress Notes (Signed)
   06/10/2023  Patient ID: Jillian Huynh, female   DOB: 22-Sep-1946, 77 y.o.   MRN: [id number]  Outreach attempt for scheduled telephone visit unsuccessful, but I was 30 minutes delayed in calling due to previous appointment running over.  Left HIPAA compliant message with my direct phone number for patient to call and stated I could plan to call tomorrow at 3pm.  [name], PharmD, DPLA

## 2023-06-10 NOTE — Telephone Encounter (Signed)
Patient just returned your call

## 2023-06-11 ENCOUNTER — Other Ambulatory Visit: Payer: Medicare HMO

## 2023-06-11 NOTE — Progress Notes (Signed)
   06/11/2023  Patient ID: [name], female   DOB: 22-Jul-1946, 77 y.o.   MRN: [id number]  S/O Telephone visit to follow-up on diabetes management  Diabetes Management Plan -Current medications:  [name] 10mg  daily, Ozempic 0.5mg  weekly -She has continued to hold Tresiba at this time- states BG will get up to 200 toward EOD -FBG 145-150 -Taking full tablet of 10mg  Farxiga versus one-half tablet (hard for her to split) -Working on getting Farxiga 5mg  through [facility], because 10mg  likely contributing to hypotension  Diabetes Management Plan -Increase to Ozempic 1mg  weekly- give 2 injections of 0.5mg  to equal 1mg  until 1mg  received from [facility] -Continue to hold Tresiba at this time -Coordinating with medication assistance team to get order change form for Ozempic 1mg  to [facility]  Follow-up:  Telephone follow-up to check on tolerance/efficacy of Ozempic 1mg  in 2 weeks.  Will assess need to bring [place] back on board at lower dose.  Patient sees Dr. Jens Som 8/12 and Dr. Linford Arnold 8/27.  [name], PharmD, DPLA

## 2023-06-15 DIAGNOSIS — E119 Type 2 diabetes mellitus without complications: Secondary | ICD-10-CM | POA: Diagnosis not present

## 2023-06-18 NOTE — Progress Notes (Signed)
HPI: FU CHF. ABIs October 2022 normal. Echocardiogram November 2022 showed septal hypokinesis, ejection fraction 45 to 50%, moderate left ventricular hypertrophy, grade 1 diastolic dysfunction, moderate mitral regurgitation, mild aortic insufficiency. Cardiac CTA November 2022 showed calcium score 162 which was 70th percentile and minimal nonobstructive disease in the LAD and circumflex.  Patient was initiated on medical therapy.  Cardiac MRI August 2023 showed ejection fraction 49% with septal/lateral dyssynchrony consistent with left bundle branch block, normal RV function.  Echocardiogram repeated November 2023 and showed ejection fraction 50 to 55%, moderate left ventricular hypertrophy, grade 1 diastolic dysfunction, mild aortic insufficiency.  Since last seen, she complains of continuing fatigue unchanged with discontinuing her Toprol.  She denies dyspnea, chest pain or syncope.  Current Outpatient Medications  Medication Sig Dispense Refill   atorvastatin (LIPITOR) 40 MG tablet Take 1 tablet (40 mg total) by mouth daily. 30 tablet 6   B-D ULTRAFINE III SHORT PEN 31G X 8 MM MISC SUB-Q Daily 100 each 4   Calcium Carbonate-Vit D-Min (CALCIUM 1200 PO) Take 1 tablet by mouth in the morning and at bedtime.     Continuous Blood Gluc Receiver (FREESTYLE LIBRE 2 READER) DEVI 1 each by Does not apply route once. Used with sensors, obtained via DME     Continuous Blood Gluc Sensor (FREESTYLE LIBRE 2 SENSOR) MISC 1 Application by Does not apply route every 14 (fourteen) days. Patient obtains via DME     dapagliflozin propanediol (FARXIGA) 5 MG TABS tablet Take 1 tablet (5 mg total) by mouth daily before breakfast. This replaces 10mg  tablets. 90 tablet 3   ezetimibe (ZETIA) 10 MG tablet Take 1 tablet by mouth once daily 90 tablet 3   fish oil-omega-3 fatty acids 1000 MG capsule Take 1 g by mouth daily.     Multiple Vitamin (MULTIVITAMIN WITH MINERALS) TABS tablet Take 1 tablet by mouth daily.      mupirocin ointment (BACTROBAN) 2 % Apply topically 2 (two) times daily. X 7 days 30 g 0   omeprazole (PRILOSEC) 20 MG capsule Take 20 mg by mouth daily.     rOPINIRole (REQUIP) 4 MG tablet Take 1 tablet by mouth once daily 90 tablet 0   sacubitril-valsartan (ENTRESTO) 24-26 MG Take 1 tablet by mouth 2 (two) times daily. 60 tablet 11   Semaglutide,0.25 or 0.5MG /DOS, (OZEMPIC, 0.25 OR 0.5 MG/DOSE,) 2 MG/3ML SOPN Inject 0.5 mg into the skin once a week.     Insulin Degludec (TRESIBA [place]) Inject 50 Units into the skin daily. (Patient not taking: Reported on 06/24/2023)     No current facility-administered medications for this visit.     Past Medical History:  Diagnosis Date   Diabetes mellitus without complication (HCC)    Hyperlipidemia    Hypertension     Past Surgical History:  Procedure Laterality Date   CHOLECYSTECTOMY      Social History   Socioeconomic History   Marital status: Married    Spouse name: Social research officer, government   Number of children: 3   Years of education: 16.5   Highest education level: Some college, no degree  Occupational History   Occupation: Social worker    Comment: part-time  Tobacco Use   Smoking status: Never   Smokeless tobacco: Never  Vaping Use   Vaping status: Never Used  Substance and Sexual Activity   Alcohol use: No    Alcohol/week: 0.0 standard drinks of alcohol   Drug use: No   Sexual activity: Yes  Partners: Male  Other Topics Concern   Not on file  Social History Narrative   Lives with her husband. She is working full time as a Social worker. Two of her children live close by. One is in [place]. One of her daughter's is deceased. Her hobbies include watch tv, listening to music and reading.    Social Determinants of Health   Financial Resource Strain: Low Risk  (09/19/2022)   Overall Financial Resource Strain (CARDIA)    Difficulty of Paying Living Expenses: Not hard at all  Food Insecurity: No Food Insecurity (09/19/2022)   Hunger Vital  Sign    Worried About Running Out of Food in the Last Year: Never true    Ran Out of Food in the Last Year: Never true  Transportation Needs: No Transportation Needs (09/19/2022)   PRAPARE - Administrator, Civil Service (Medical): No    Lack of Transportation (Non-Medical): No  Physical Activity: Inactive (09/19/2022)   Exercise Vital Sign    Days of Exercise per Week: 0 days    Minutes of Exercise per Session: 0 min  Stress: No Stress Concern Present (07/22/2021)   [employer] of Occupational Health - Occupational Stress Questionnaire    Feeling of Stress : Not at all  Social Connections: Moderately Integrated (09/19/2022)   Social Connection and Isolation Panel [NHANES]    Frequency of Communication with Friends and Family: More than three times a week    Frequency of Social Gatherings with Friends and Family: More than three times a week    Attends Religious Services: Never    Database administrator or Organizations: Yes    Attends Engineer, structural: More than 4 times per year    Marital Status: Married  Catering manager Violence: Not At Risk (09/19/2022)   Humiliation, Afraid, Rape, and Kick questionnaire    Fear of Current or Ex-Partner: No    Emotionally Abused: No    Physically Abused: No    Sexually Abused: No    Family History  Problem Relation Age of Onset   Diabetes Father    Cancer Father 82       Esophogeal Ca., Lung.   Hypertension Other    Thyroid disease Other    Colon cancer Daughter 70   Glaucoma Mother    Hepatitis C Mother 85       Died from.   Glaucoma Sister    Glaucoma Sister    Glaucoma Maternal Aunt     ROS: no fevers or chills, productive cough, hemoptysis, dysphasia, odynophagia, melena, hematochezia, dysuria, hematuria, rash, seizure activity, orthopnea, PND, pedal edema, claudication. Remaining systems are negative.  Physical Exam: Well-developed well-nourished in no acute distress.  Skin is warm and dry.  HEENT  is normal.  Neck is supple.  Chest is clear to auscultation with normal expansion.  Cardiovascular exam is regular rate and rhythm.  Abdominal exam nontender or distended. No masses palpated. Extremities show no edema. neuro grossly intact  A/P  1 nonischemic cardiomyopathy-LV function improved to low normal on most recent study.  Previous reduction felt possibly secondary to left bundle branch block/dyssynchrony as she had no obstructive disease on prior CTA.  Continue [place] Custard and spironolactone.  Toprol discontinued previously to see if her fatigue would improve.  Her symptoms persist and we will therefore resume 12.5 mg daily.  2 chronic combined systolic/diastolic congestive heart failure-patient is euvolemic on examination.  Continue Farxiga at present dose.  3 coronary artery  disease-mild on previous CTA.  Continue statin.  4 aortic insufficiency-noted to be mild on most recent echocardiogram.  5 hyperlipidemia-continue statin.  6 hypertension-patient's blood pressure is controlled.  Continue present medical regimen.  7 obstructive sleep apnea-patient discontinued her CPAP as she did not tolerate.  Olga Millers, MD

## 2023-06-19 ENCOUNTER — Telehealth: Payer: Self-pay

## 2023-06-19 MED ORDER — DAPAGLIFLOZIN PROPANEDIOL 5 MG PO TABS: 5.0000 mg | ORAL_TABLET | Freq: Every day | ORAL | 3 refills | Status: DC

## 2023-06-19 NOTE — Telephone Encounter (Signed)
I HAVE FAXED DOSE INCREASE FOR OZEMPIC TO PROVIDER OFFICE!    PLEASE BE ADVISED.

## 2023-06-19 NOTE — Telephone Encounter (Signed)
-----   Message from [name] sent at 06/19/2023 10:04 AM EDT ----- Good morning!  We would like to increase Jillian Huynh's Ozempic received through [facility] to 1mg  weekly.  Could you all assist with an order change form, please?

## 2023-06-21 DIAGNOSIS — E118 Type 2 diabetes mellitus with unspecified complications: Secondary | ICD-10-CM | POA: Diagnosis not present

## 2023-06-21 DIAGNOSIS — Z794 Long term (current) use of insulin: Secondary | ICD-10-CM | POA: Diagnosis not present

## 2023-06-21 NOTE — Telephone Encounter (Signed)
Forms placed in your basket.

## 2023-06-21 NOTE — Telephone Encounter (Signed)
Completed, please fax back

## 2023-06-21 NOTE — Telephone Encounter (Signed)
The 2nd page is blank. Could you send the form again?

## 2023-06-24 ENCOUNTER — Ambulatory Visit: Payer: Medicare HMO | Admitting: Cardiology

## 2023-06-24 ENCOUNTER — Encounter: Payer: Self-pay | Admitting: Cardiology

## 2023-06-24 VITALS — BP 100/59 | HR 88 | Ht 62.0 in | Wt 183.0 lb

## 2023-06-24 DIAGNOSIS — E7800 Pure hypercholesterolemia, unspecified: Secondary | ICD-10-CM | POA: Diagnosis not present

## 2023-06-24 DIAGNOSIS — G4733 Obstructive sleep apnea (adult) (pediatric): Secondary | ICD-10-CM | POA: Diagnosis not present

## 2023-06-24 DIAGNOSIS — I10 Essential (primary) hypertension: Secondary | ICD-10-CM

## 2023-06-24 DIAGNOSIS — I2510 Atherosclerotic heart disease of native coronary artery without angina pectoris: Secondary | ICD-10-CM | POA: Diagnosis not present

## 2023-06-24 DIAGNOSIS — I429 Cardiomyopathy, unspecified: Secondary | ICD-10-CM | POA: Diagnosis not present

## 2023-06-24 MED ORDER — METOPROLOL SUCCINATE ER 25 MG PO TB24: 12.5000 mg | ORAL_TABLET | Freq: Every day | ORAL | 3 refills | Status: DC

## 2023-06-24 NOTE — Telephone Encounter (Signed)
Forms faxed, copy scanned in pt's chart confirmation received.

## 2023-06-24 NOTE — Patient Instructions (Signed)
Medication Instructions:   START METOPROLOL 12.5 MG ONCE DAILY=1/2 OF 25 MG TABLET ONCE DAILY  *If you need a refill on your cardiac medications before your next appointment, please call your pharmacy*   Follow-Up: At [facility], you and your health needs are our priority.  As part of our continuing mission to provide you with exceptional heart care, we have created designated Provider Care Teams.  These Care Teams include your primary Cardiologist (physician) and Advanced Practice Providers (APPs -  Physician Assistants and Nurse Practitioners) who all work together to provide you with the care you need, when you need it.  We recommend signing up for the patient portal called "MyChart".  Sign up information is provided on this After Visit Summary.  MyChart is used to connect with patients for Virtual Visits (Telemedicine).  Patients are able to view lab/test results, encounter notes, upcoming appointments, etc.  Non-urgent messages can be sent to your provider as well.   To learn more about what you can do with MyChart, go to [email or web address].    Your next appointment:   12 month(s)  Provider:   Olga Millers, MD

## 2023-06-25 ENCOUNTER — Other Ambulatory Visit: Payer: Medicare HMO

## 2023-06-25 DIAGNOSIS — G4733 Obstructive sleep apnea (adult) (pediatric): Secondary | ICD-10-CM | POA: Diagnosis not present

## 2023-06-25 NOTE — Progress Notes (Unsigned)
   06/25/2023  Patient ID: Jillian Huynh, female   DOB: 01-31-1946, 77 y.o.   MRN: [id number]  Outreach attempt for scheduled telephone visit unsuccessful.  Left HIPAA compliant voicemail with my direct phone number and will try to contact patient later this week if I do not hear back.  [name], PharmD, DPLA

## 2023-06-28 ENCOUNTER — Telehealth: Payer: Self-pay

## 2023-06-28 NOTE — Progress Notes (Signed)
   06/28/2023  Patient ID: Jillian Huynh, female   DOB: [birth year], 77 y.o.   MRN: [id number]  Outreach attempt to reschedule recently missed telephone follow-up visit.  I was not able to reach Ms. Milanese, so I left a HIPAA compliant voicemail with my direct phone number.  [name], PharmD, DPLA

## 2023-06-28 NOTE — Telephone Encounter (Signed)
PAP: Application for Ozempic has been submitted to PAP Companies: [employer], via fax    PLEASED BE ADVISED

## 2023-07-01 NOTE — Telephone Encounter (Signed)
Pt is calling back to speak to [place] Pt does not have a telephone number for [place]. And there is not one listed.  please advise [phone]

## 2023-07-02 NOTE — Telephone Encounter (Signed)
Awesome!  I wasn't sure if you were okay with [place] giving you number to patient's so I just sent the message.  I wrote it down for future reference.  Thank you!

## 2023-07-03 NOTE — Telephone Encounter (Signed)
PAP: Patient assistance application for Ozempic has been approved by PAP Companies: [employer] from 07/02/2023 to 11/12/2023. Medication should be delivered to PAP Delivery: Provider's office For further shipping updates, please contact [employer] at [phone] Pt ID is: [id number]

## 2023-07-04 ENCOUNTER — Telehealth: Payer: Self-pay

## 2023-07-04 NOTE — Progress Notes (Signed)
   07/04/2023  Patient ID: Jillian Huynh, female   DOB: [birth year], 77 y.o.   MRN: [id number]  Outreach attempt to return patient's call (which was returning my previous call) was unsuccessful.  I am hoping to schedule a telephone follow-up visit with her to check in on medications and home BG.  I was able to leave a message with my direct phone number to call back at her convenience.    [name], PharmD, DPLA

## 2023-07-08 ENCOUNTER — Telehealth: Payer: Self-pay

## 2023-07-08 NOTE — Progress Notes (Signed)
   07/08/2023  Patient ID: Jillian Huynh, female   DOB: Jul 01, 1946, 77 y.o.   MRN: [id number]  S/O Incoming call from patient  stating she is currently experiencing significant constipation.  Medication Problem -This has occurred previously but seems to have worsened with Ozempic use -Patient is currently taking Ozempic 0.5mg  weekly, but forgot medication on recent trip to [place] and missed a dose.  Even without dose for a week, she still experienced constipation. -Took Ozempic 0.5mg  dose this past Saturday after return home from vacation -Currently having to take up to 3 OTC stool softeners daily and has had to use 2 enemas; still not able to have BM more than every 3 days -While without Ozempic, patient states BG was running in the 200's; so she has continued to take [place] 10mg  daily versus 5mg    Medication Problem -Discussed increased water intake and fiber consumption -Patient sees Dr. Linford Arnold tomorrow; I recommend holding Ozempic at this time.  Once symptoms have resolved, could revisit 0.25mg  dosing or consider Rybelsus 3mg  daily. -If BP allows, can continue [place] 10mg  daily  -May need to consider adding Tresiba back on while patient is not taking Ozempic; I would recommend starting 10 units and increasing by 2 units every 3 days until FBG 100-130  Follow-up:  9/11  [name], PharmD, DPLA

## 2023-07-09 ENCOUNTER — Ambulatory Visit: Payer: Medicare HMO | Admitting: Family Medicine

## 2023-07-09 ENCOUNTER — Encounter: Payer: Self-pay | Admitting: Family Medicine

## 2023-07-09 VITALS — BP 110/67 | HR 77 | Ht 62.0 in | Wt 181.0 lb

## 2023-07-09 DIAGNOSIS — F321 Major depressive disorder, single episode, moderate: Secondary | ICD-10-CM

## 2023-07-09 DIAGNOSIS — E118 Type 2 diabetes mellitus with unspecified complications: Secondary | ICD-10-CM | POA: Diagnosis not present

## 2023-07-09 DIAGNOSIS — K5909 Other constipation: Secondary | ICD-10-CM | POA: Diagnosis not present

## 2023-07-09 DIAGNOSIS — I10 Essential (primary) hypertension: Secondary | ICD-10-CM | POA: Diagnosis not present

## 2023-07-09 DIAGNOSIS — Z7984 Long term (current) use of oral hypoglycemic drugs: Secondary | ICD-10-CM

## 2023-07-09 DIAGNOSIS — I5022 Chronic systolic (congestive) heart failure: Secondary | ICD-10-CM | POA: Diagnosis not present

## 2023-07-09 LAB — POCT GLYCOSYLATED HEMOGLOBIN (HGB A1C): Hemoglobin A1C: 8.1 % — AB (ref 4.0–5.6)

## 2023-07-09 MED ORDER — SEMAGLUTIDE (1 MG/DOSE) 4 MG/3ML ~~LOC~~ SOPN: 1.0000 mg | PEN_INJECTOR | SUBCUTANEOUS | Status: DC

## 2023-07-09 MED ORDER — ESCITALOPRAM OXALATE 10 MG PO TABS: ORAL_TABLET | ORAL | 1 refills | Status: DC

## 2023-07-09 MED ORDER — ROPINIROLE HCL 4 MG PO TABS: 4.0000 mg | ORAL_TABLET | Freq: Every day | ORAL | 0 refills | Status: DC

## 2023-07-09 NOTE — Patient Instructions (Signed)
Please take 1 capful of MiraLAX mixed with 16 ounces of water twice a day until you have runny stools and you are no longer passing hard stools or chunks of stool.  At the same time I also want you taking 2 tabs of Senokot ( or Dulcolax) twice a day also until you have loose stools.  Once you feel like your bowels are cleaned out then please start taking the softener 3 times a day.  1 tab with each meal.  And continue to use 1 capful of MiraLAX every night at bedtime.

## 2023-07-09 NOTE — Progress Notes (Unsigned)
Established Patient Office Visit  Subjective   Patient ID: Jillian Huynh, female    DOB: 27-Nov-1945  Age: 77 y.o. MRN: [id number]  Chief Complaint  Patient presents with   Diabetes    HPI  Diabetes - no hypoglycemic events. No wounds or sores that are not healing well. No increased thirst or urination. Checking glucose at home. Taking medications as prescribed without any side effects. We were able to get the 1mg  semaglutaide approved through PAP.  Also while she was on vacation her Josephine Igo 3 died and so was unable to really track her blood sugars for a while.   Lab Results  Component Value Date   HGBA1C 8.1 (A) 07/09/2023   She says she has felt really depressed. Her whole life has changed with her physical health and she can no longer do the things she love and it has really taken a toll on her mentally.   Flowsheet Row Office Visit from 07/09/2023 in [facility]  PHQ-9 Total Score 12       Hypertension-she did bring in a home log of blood pressures.  Best blood pressure was 119/63 and highest pressure was 130/76.  Most of them were under 130 with a pulse typically in the 60s.    ROS    Objective:     BP 110/67   Pulse 77   Ht 5\' 2"  (1.575 m)   Wt 181 lb (82.1 kg)   SpO2 97%   BMI 33.11 kg/m    Physical Exam Vitals and nursing note reviewed.  Constitutional:      Appearance: Normal appearance.  HENT:     Head: Normocephalic and atraumatic.     Right Ear: External ear normal.     Left Ear: External ear normal.     Nose: Nose normal.  Eyes:     Conjunctiva/sclera: Conjunctivae normal.  Cardiovascular:     Rate and Rhythm: Normal rate and regular rhythm.  Pulmonary:     Effort: Pulmonary effort is normal.     Breath sounds: Normal breath sounds.  Musculoskeletal:     Cervical back: Neck supple. No tenderness.  Lymphadenopathy:     Cervical: No cervical adenopathy.  Skin:    General: Skin is warm  and dry.  Neurological:     Mental Status: She is alert and oriented to person, place, and time.  Psychiatric:        Mood and Affect: Mood normal.      Results for orders placed or performed in visit on 07/09/23  POCT HgB A1C  Result Value Ref Range   Hemoglobin A1C 8.1 (A) 4.0 - 5.6 %   HbA1c POC (<> result, manual entry)     HbA1c, POC (prediabetic range)     HbA1c, POC (controlled diabetic range)        The ASCVD Risk score (Arnett DK, et al., 2019) failed to calculate for the following reasons:   The valid total cholesterol range is 130 to 320 mg/dL    Assessment & Plan:   Problem List Items Addressed This Visit       Cardiovascular and Mediastinum   Systolic CHF (HCC)    Recently restart the metoprolol 12.5mg        BP (high blood pressure)    Pressure looks fantastic at home and here today.        Endocrine   Diabetes mellitus type 2, controlled (HCC) - Primary  Just bumped dose of semaglutide to 1mg  about 3 weeks ago and constipation got worse.  See note below.  A1C is trending down which is great. I think she will be back on tract at next OV>  continue with 1mg  dose for now  Lab Results  Component Value Date   HGBA1C 8.1 (A) 07/09/2023         Relevant Medications   Semaglutide, 1 MG/DOSE, 4 MG/3ML SOPN   Other Relevant Orders   POCT HgB A1C (Completed)     Other   Depression, major, single episode, moderate (HCC)    Discussed options. She is open to medication. Will start with 5mg  lexapr and work up to 10mg  dose.  F/U in 3-4 weeks.  PHQ - 9 12 today.       Relevant Medications   escitalopram (LEXAPRO) 10 MG tablet   Other Visit Diagnoses     Other constipation       Current moderate episode of major depressive disorder without prior episode (HCC)       Relevant Medications   escitalopram (LEXAPRO) 10 MG tablet       Constipation - Please take 1 capful of MiraLAX mixed with 16 ounces of water twice a day until you have runny stools  and you are no longer passing hard stools or chunks of stool.  At the same time I also want you taking 2 tabs of Senokot ( or Dulcolax) twice a day also until you have loose stools.  Once you feel like your bowels are cleaned out then please start taking the softener 3 times a day.  1 tab with each meal.  And continue to use 1 capful of MiraLAX every night at bedtime.  Return in about 23 days (around 08/01/2023) for New start medication.    Nani Gasser, MD

## 2023-07-10 DIAGNOSIS — F321 Major depressive disorder, single episode, moderate: Secondary | ICD-10-CM | POA: Insufficient documentation

## 2023-07-10 DIAGNOSIS — F325 Major depressive disorder, single episode, in full remission: Secondary | ICD-10-CM | POA: Insufficient documentation

## 2023-07-10 NOTE — Assessment & Plan Note (Addendum)
Discussed options. She is open to medication. Will start with 5mg  lexapr and work up to 10mg  dose.  F/U in 3-4 weeks.  PHQ - 9 12 today.

## 2023-07-10 NOTE — Assessment & Plan Note (Signed)
Just bumped dose of semaglutide to 1mg  about 3 weeks ago and constipation got worse.  See note below.  A1C is trending down which is great. I think she will be back on tract at next OV>  continue with 1mg  dose for now  Lab Results  Component Value Date   HGBA1C 8.1 (A) 07/09/2023

## 2023-07-10 NOTE — Assessment & Plan Note (Signed)
Recently restart the metoprolol 12.5mg 

## 2023-07-10 NOTE — Assessment & Plan Note (Signed)
Pressure looks fantastic at home and here today.

## 2023-07-11 ENCOUNTER — Other Ambulatory Visit: Payer: Self-pay | Admitting: Family Medicine

## 2023-07-18 ENCOUNTER — Telehealth: Payer: Self-pay

## 2023-07-18 NOTE — Telephone Encounter (Signed)
I called and left a message advising the Ozempic from [employer] patient assistance has arrived. Placed in refrigerator.

## 2023-07-24 ENCOUNTER — Other Ambulatory Visit: Payer: Medicare HMO

## 2023-07-24 NOTE — Progress Notes (Signed)
   07/24/2023  Patient ID: Jillian Huynh, female   DOB: 06-10-1946, 77 y.o.   MRN: [id number]  S/O Telephone visit to check on diabetes control and recent consipation  Diabetes Management Plan -Current medications:  Ozempic 1mg  weekly, Farxiga 10mg  daily -Using [employer] 2 for CGM -Patient endorses improved BG readings, FBG this morning was 101 -She does not endorse any hypoglycemia -Does endorse decreased appetite and weight loss of approximately 20lbs since starting Ozempic  Constipation -Patient was taking a stool softener 1-2x daily as well as Miralax twice daily but states she had a day with 14 bowel movements; so she stopped using Miralax but continued 1-2x/daily stool softener -She reports currently being on day 3 of no BM -Endorses stomach/pain discomfort likely related to constipation  A/P  Diabetes Management Plan -Continue current regimen at this time -Ozempic could be aggravating GI upset and constipation, but this typically resolves over time  Constipation -Recommended that patient resume Miralax once daily along with 1 stool softener daily; can take second dose of Miralax if 2 days w/o BM  Follow-up:  Patient sees Dr. Linford Arnold again 9/23 and I will follow-up with her after  [name], PharmD, DPLA

## 2023-07-26 DIAGNOSIS — G4733 Obstructive sleep apnea (adult) (pediatric): Secondary | ICD-10-CM | POA: Diagnosis not present

## 2023-07-31 DIAGNOSIS — E119 Type 2 diabetes mellitus without complications: Secondary | ICD-10-CM | POA: Diagnosis not present

## 2023-08-05 ENCOUNTER — Encounter: Payer: Self-pay | Admitting: Family Medicine

## 2023-08-05 ENCOUNTER — Ambulatory Visit (INDEPENDENT_AMBULATORY_CARE_PROVIDER_SITE_OTHER): Payer: Medicare HMO | Admitting: Family Medicine

## 2023-08-05 VITALS — BP 121/55 | HR 77 | Ht 62.0 in | Wt 173.0 lb

## 2023-08-05 DIAGNOSIS — F321 Major depressive disorder, single episode, moderate: Secondary | ICD-10-CM

## 2023-08-05 DIAGNOSIS — I110 Hypertensive heart disease with heart failure: Secondary | ICD-10-CM

## 2023-08-05 DIAGNOSIS — E1159 Type 2 diabetes mellitus with other circulatory complications: Secondary | ICD-10-CM

## 2023-08-05 DIAGNOSIS — E118 Type 2 diabetes mellitus with unspecified complications: Secondary | ICD-10-CM

## 2023-08-05 DIAGNOSIS — G2581 Restless legs syndrome: Secondary | ICD-10-CM | POA: Diagnosis not present

## 2023-08-05 DIAGNOSIS — I10 Essential (primary) hypertension: Secondary | ICD-10-CM | POA: Diagnosis not present

## 2023-08-05 MED ORDER — PREGABALIN 25 MG PO CAPS
25.0000 mg | ORAL_CAPSULE | Freq: Every day | ORAL | 1 refills | Status: DC
Start: 2023-08-05 — End: 2023-08-29

## 2023-08-05 NOTE — Progress Notes (Signed)
Established Patient Office Visit  Subjective   Patient ID: Jillian Huynh, female    DOB: November 26, 1945  Age: 77 y.o. MRN: [id number]  Chief Complaint  Patient presents with   mood    HPI  We week follow-up for new start medication:  Diabetes - no hypoglycemic events. No wounds or sores that are not healing well. No increased thirst or urination. Checking glucose at home. Taking medications as prescribed without any side effects.  F/U Depression -he is now up to a full 10 mg on the Lexapro.  She feels like it has been helpful.  She says she still struggling with her restless leg right now and that is part of what is been making her emotionally little bit more down.  But otherwise she has not noticed any negative side effects with the medication.  Her restless leg has really kicks in around 4 to 5 PM she starts getting the severe tingling in her feet that start radiating up her legs she says it has been so bothersome that a lot at night she will try to try to go to bed early.  But then that is disrupting her sleep cycle.  She is already on ropinirole 4 mg.  Iron levels were great about 5 months ago.  Her symptoms have been really ramped up over the last couple of months.     ROS    Objective:     BP (!) 121/55   Pulse 77   Ht 5\' 2"  (1.575 m)   Wt 173 lb (78.5 kg)   SpO2 98%   BMI 31.64 kg/m    Physical Exam Vitals and nursing note reviewed.  Constitutional:      Appearance: Normal appearance.  HENT:     Head: Normocephalic and atraumatic.  Eyes:     Conjunctiva/sclera: Conjunctivae normal.  Cardiovascular:     Rate and Rhythm: Normal rate and regular rhythm.  Pulmonary:     Effort: Pulmonary effort is normal.     Breath sounds: Normal breath sounds.  Skin:    General: Skin is warm and dry.  Neurological:     Mental Status: She is alert.  Psychiatric:        Mood and Affect: Mood normal.      No results found for any visits on 08/05/23.    The ASCVD Risk  score (Arnett DK, et al., 2019) failed to calculate for the following reasons:   The valid total cholesterol range is 130 to 320 mg/dL    Assessment & Plan:   Problem List Items Addressed This Visit       Cardiovascular and Mediastinum   Hypertensive heart disease with heart failure (HCC)    No sign of volume overload      BP (high blood pressure)    BP looks great today!!         Endocrine   Type 2 diabetes mellitus with circulatory disorder (HCC)    BP well controlled.       Diabetes mellitus type 2, controlled (HCC) - Primary    A1c was uncontrolled at 8.1.  She says she has been getting some fantastic blood sugars and is doing really well she has been better able to control her constipation she has been using the MiraLAX as needed and taking a stool softener twice a day so that is been a lot more manageable.  Her weight is down to 173 so she is actually down about 8 pounds since I last  saw her.        Other   Restless leg    We will try the pregabalin for your restless leg. Take around 4PM.  This will be in place for ropinirole  Recent if on level are up to date but consider rechecking again with next set of labs.        Depression, major, single episode, moderate (HCC)    Doing well so far her PHQ-9 score was 12 its down to 4 today which is absolutely fantastic she is only been on the full dose of Lexapro for about a week and a half we will continue with current regimen and plan to follow-up in 8 weeks.       Return in about 8 weeks (around 09/30/2023) for Diabetes follow-up, Mood followup  .    Nani Gasser, MD

## 2023-08-05 NOTE — Assessment & Plan Note (Signed)
A1c was uncontrolled at 8.1.  She says she has been getting some fantastic blood sugars and is doing really well she has been better able to control her constipation she has been using the MiraLAX as needed and taking a stool softener twice a day so that is been a lot more manageable.  Her weight is down to 173 so she is actually down about 8 pounds since I last saw her.

## 2023-08-05 NOTE — Assessment & Plan Note (Signed)
We will try the pregabalin for your restless leg. Take around 4PM.  This will be in place for ropinirole  Recent if on level are up to date but consider rechecking again with next set of labs.

## 2023-08-05 NOTE — Patient Instructions (Signed)
We will try the pregabalin for your restless leg. Take around 4PM.  This will be in place for ropinirole

## 2023-08-05 NOTE — Assessment & Plan Note (Signed)
Doing well so far her PHQ-9 score was 12 its down to 4 today which is absolutely fantastic she is only been on the full dose of Lexapro for about a week and a half we will continue with current regimen and plan to follow-up in 8 weeks.

## 2023-08-05 NOTE — Assessment & Plan Note (Signed)
BP looks great today!!

## 2023-08-07 ENCOUNTER — Telehealth: Payer: Self-pay | Admitting: Family Medicine

## 2023-08-07 DIAGNOSIS — I110 Hypertensive heart disease with heart failure: Secondary | ICD-10-CM | POA: Insufficient documentation

## 2023-08-07 DIAGNOSIS — E1159 Type 2 diabetes mellitus with other circulatory complications: Secondary | ICD-10-CM | POA: Insufficient documentation

## 2023-08-07 NOTE — Assessment & Plan Note (Signed)
No sign of volume overload.

## 2023-08-07 NOTE — Telephone Encounter (Signed)
Patient called stating she was prescribed a new medication for restless leg syndrome but the pharmacy needs confirmation from Dr. Linford Arnold due to insurance not covering the medication. Please advise.  Pharmacy -  [facility] - [place], [place] - [address]

## 2023-08-07 NOTE — Assessment & Plan Note (Signed)
BP well controlled.

## 2023-08-09 ENCOUNTER — Telehealth: Payer: Self-pay | Admitting: Family Medicine

## 2023-08-09 NOTE — Telephone Encounter (Signed)
PA is needed for this medication.

## 2023-08-09 NOTE — Telephone Encounter (Signed)
Patient called she is requesting a PA for PREGABALIN [facility] 25mg  Pharmacy [facility] [place] [address] [phone]

## 2023-08-21 ENCOUNTER — Telehealth: Payer: Self-pay

## 2023-08-21 NOTE — Progress Notes (Signed)
   08/21/2023  Patient ID: Jillian Huynh, female   DOB: 03-Huynh-1947, 77 y.o.   MRN: [id number]  Patient outreach to schedule telephone follow-up visit with patient to check on home BG.  Appointment scheduled for Thursday 10/17 at 2pm.  [name], PharmD, DPLA

## 2023-08-25 DIAGNOSIS — G4733 Obstructive sleep apnea (adult) (pediatric): Secondary | ICD-10-CM | POA: Diagnosis not present

## 2023-08-29 ENCOUNTER — Other Ambulatory Visit: Payer: Self-pay

## 2023-08-29 NOTE — Progress Notes (Signed)
   08/29/2023  Patient ID: Jillian Huynh, female   DOB: 1945-12-25, 77 y.o.   MRN: [id number]  S/O Telephone visit to follow-up on medication management of chronic disease states  DM -Current medications:  Ozempic 1mg  weekly, Farxiga 10mg  daily -Using [place] 2 for CGM -FBG 120-130 -A1c 8.1% on 8/27 -Patient does not endorse any s/sx of hypo or hyperglycemia -States she is down to 167lb from the 190's and is feeling the best she has in a while -Enrolled in [employer] PAP for Ozempic and [employer] PAP for Farxiga -Received information packet from [employer] regarding [name], but she is unsure what to do with it  HTN -Current medications:  metoprolol xl 12.5mg  daily, Entresto 24/26mg  BID -BP 121/55 at last OV; and patient states numbers have been consistent with this at home -Does not endorse s/sx of hypotension recently -Enrolled in [employer] grant to assist with Entresto copays (enrollment ends 01/29/24)  HLD -Current medications:  atorvastatin 40mg  daily, ezetimibe 10mg  daily  -Lipid panel 12/25/21:  TC- 110, HDL- 40, TG-188, LDL-44  Chronic Constipation -Patient has established good bowel regimen with daily stool softener and Miralax -Endorses constipation occurrences have decreased drastically  A/P  DM -Continue current regimen at this time -Patient sees Dr. Linford Arnold 11/18 and will be due for A1c; if >7, I recommend consideration of increasing Ozempic to 2mg  weekly as long as constipation has continued to be better controlled -Patient bringing [employer] paperwork for [name] to me at [facility] Thursday 10/24  HTN -Currently controlled -Continue current regimen, regular monitoring and regular follow-up with PCP  HLD -Currently controlled -Continue current regimen and regular follow-up with PCP  Chronic Constipation -Currently controlled -Continue current regimen  Follow-up:  Will review [employer] paperwork and call patient to review; will schedule follow-up visit at this time  [name],  PharmD, DPLA

## 2023-09-02 DIAGNOSIS — E119 Type 2 diabetes mellitus without complications: Secondary | ICD-10-CM | POA: Diagnosis not present

## 2023-09-06 NOTE — Progress Notes (Unsigned)
   09/06/2023  Patient ID: Jillian Huynh, female   DOB: 29-Mar-1946, 77 y.o.   MRN: [id number]  Patient brought in [employer] paperwork she received regarding re-enrollment for the program for 2025.  I reviewed the paperwork and contacted patient with next steps necessary to complete the patient consent portion; but she has not received the link from the program to complete her consent; and the website is not working properly.  Contacted [employer], and they were attempted to send the link to an email address that was not working.  Requested that the link be sent via text message and verified patient's mobile number with the company.  Ms. Mole is aware to look out for a text from [employer], and she will contact me if she does not receive this or needs further assistance.  [name], PharmD, DPLA

## 2023-09-11 NOTE — Progress Notes (Signed)
   09/11/2023  Jillian Huynh ID: Jillian Huynh, female   DOB: 01/26/1946, 77 y.o.   MRN: [id number]  Returning Jillian Huynh's call- she has yet to receive communication from [employer] with the link needed to provide consent for Farxiga PAP renewal for 2025.  I contacted [employer] and was informed Jillian Huynh can complete 1st page of their current application and submit for 2025 program consent.  Jillian Huynh is coming into [facility] 11/8 at 3pm for [place] to complete this.  [name], PharmD, DPLA

## 2023-09-13 NOTE — Telephone Encounter (Addendum)
Initiated Prior authorization ZOX:[id number] 25MG  capsules Via: Covermymeds Case/Key:[id number] Status: approved  as of 09/13/23 Reason:Authorization Expiration Date: 11/12/2023  Notified Pt via: Mychart

## 2023-09-20 ENCOUNTER — Other Ambulatory Visit: Payer: Medicare HMO

## 2023-09-20 NOTE — Progress Notes (Signed)
   09/20/2023  Patient ID: Jillian Huynh, female   DOB: 01/04/1946, 77 y.o.   MRN: [id number]  Patient presenting to [facility] to complete [employer] PAP 2025 renewal for Farxiga 2025.  This has been completed and faxed to the company.  Patient also receives PAP through [employer] for Ozempic 1mg ; she was also receiving [place] and pen needles but no longer needs insulin therapy.  I will work on [employer] PAP renewal for 2025, too.  Patient reports continued constipation, but this is not as severe as it was previously.  She continues to take 100mg  of docusate BID and Miralax daily.  Follow-up scheduled for 11/18/23 to check on BG and constipation.  Patient sees Dr. Linford Arnold later this month.  [name], PharmD, DPLA

## 2023-09-23 ENCOUNTER — Telehealth: Payer: Self-pay | Admitting: General Practice

## 2023-09-23 NOTE — Telephone Encounter (Signed)
Patient was scheduled for her medicare wellness visit via telephone today. I called patient to complete the visit. Patient requested to cancel. Patient said that she did not want a wellness visit. since she has an appointment with her PCP on the 18th. Appt cancelled per patient request and PCP notified.

## 2023-09-23 NOTE — Progress Notes (Signed)
   09/23/2023  Patient ID: Jillian Huynh, female   DOB: January 17, 1946, 77 y.o.   MRN: [id number]  Submitted [employer] PAP renewal application online for Ozempic 1mg  for 2025.  Patient was also receiving [place] and pen needles through [employer] PAP, but she is no longer using these.  [name], PharmD, DPLA

## 2023-09-25 ENCOUNTER — Telehealth: Payer: Self-pay | Admitting: *Deleted

## 2023-09-25 NOTE — Telephone Encounter (Signed)
Hi Cheryl,  We received a patient application from  [employer] that has been prefilled for her. Is there something that we need to do or has this already been taken care of?

## 2023-09-29 ENCOUNTER — Other Ambulatory Visit: Payer: Self-pay | Admitting: Cardiology

## 2023-09-29 DIAGNOSIS — E7800 Pure hypercholesterolemia, unspecified: Secondary | ICD-10-CM

## 2023-09-30 ENCOUNTER — Ambulatory Visit (INDEPENDENT_AMBULATORY_CARE_PROVIDER_SITE_OTHER): Payer: Medicare HMO | Admitting: Family Medicine

## 2023-09-30 ENCOUNTER — Encounter: Payer: Self-pay | Admitting: Family Medicine

## 2023-09-30 VITALS — BP 96/60 | HR 80 | Ht 62.0 in | Wt 166.0 lb

## 2023-09-30 DIAGNOSIS — G259 Extrapyramidal and movement disorder, unspecified: Secondary | ICD-10-CM | POA: Insufficient documentation

## 2023-09-30 DIAGNOSIS — E118 Type 2 diabetes mellitus with unspecified complications: Secondary | ICD-10-CM | POA: Diagnosis not present

## 2023-09-30 DIAGNOSIS — F321 Major depressive disorder, single episode, moderate: Secondary | ICD-10-CM

## 2023-09-30 DIAGNOSIS — G8929 Other chronic pain: Secondary | ICD-10-CM | POA: Diagnosis not present

## 2023-09-30 DIAGNOSIS — M545 Low back pain, unspecified: Secondary | ICD-10-CM

## 2023-09-30 DIAGNOSIS — R2689 Other abnormalities of gait and mobility: Secondary | ICD-10-CM

## 2023-09-30 DIAGNOSIS — I10 Essential (primary) hypertension: Secondary | ICD-10-CM

## 2023-09-30 DIAGNOSIS — R29898 Other symptoms and signs involving the musculoskeletal system: Secondary | ICD-10-CM

## 2023-09-30 LAB — POCT GLYCOSYLATED HEMOGLOBIN (HGB A1C): Hemoglobin A1C: 7.3 % — AB (ref 4.0–5.6)

## 2023-09-30 NOTE — Assessment & Plan Note (Signed)
We discussed that she has several symptoms that sound like they could be somewhat related to parkinsonian or Parkinson-like disorder.  She has a shuffling slow gait and feels like she is getting freezing at times she has noticed some micrographia.  She just feels really tired and fatigued.  We discussed referral to neurology for further workup.

## 2023-09-30 NOTE — Assessment & Plan Note (Addendum)
A1C looks much better this time. Down to 7.3.  She does get significant constipation on the semaglutide so has been taking a softener twice a day and MiraLAX once a day.  Occasionally if she does not feel like using MiraLAX she will just take a gummy laxative.    CGM has been really helpful for her to manage her sugar levels and helping guide her diet.

## 2023-09-30 NOTE — Assessment & Plan Note (Signed)
Blood pressure is actually low today.  She just started splitting her metoprolol yesterday and today she just realized it was on the label and had been taking a whole tab.  She is gena continue with the half a tab daily and check blood pressure at home over the next week and let me know what those blood pressures are doing.

## 2023-09-30 NOTE — Progress Notes (Addendum)
Established Patient Office Visit  Subjective   Patient ID: Jillian Huynh, female    DOB: [birth year]  Age: 77 y.o. MRN: [id number]  Chief Complaint  Patient presents with   Diabetes    HPI She is here today with her younger sister and says that she is really struggling with movement she has noticed that she has more of a shuffling gait.  It is really hard for her to purposely pick up her legs she says even when she is telling her body to move she feels like it is "freezing" she occasionally notices slight tremor but is not constant.  Noticed that her handwriting is changed she said she used to have beautiful perfect handwriting and now it is very small.  Diabetes - no hypoglycemic events. No wounds or sores that are not healing well. No increased thirst or urination. Checking glucose at home using her CGM. Taking medications as prescribed without any side effects.  Follow-up major depressive disorder-  He also decided to try pregabalin for the restless leg with optimally taking around 4 PM since symptoms started pretty early in the evening.  This was in place of the ropinirole.  HTN - she only recently started taking half a tab of the metoprolol, this week.    She also complains of low back pain and numbness and tingling down both legs.  The numbness and tingling seems to be worse later in the day so she thought maybe it was the restless legs she has been taking the ropinirole and says it does help some compared to if she does not take it it decreases the intensity.  But she takes ibuprofen almost every day because of the pain in her back and in her legs.  She did see a chiropractor at one point and got some significant relief but then they started working on Her right hip and it actually made her hip worse so she did not go back.    ROS    Objective:     BP 96/60   Pulse 80   Ht 5\' 2"  (1.575 m)   Wt 166 lb (75.3 kg)   SpO2 98%   BMI 30.36 kg/m    Physical Exam Vitals and  nursing note reviewed.  Constitutional:      Appearance: Normal appearance.  HENT:     Head: Normocephalic and atraumatic.  Eyes:     Conjunctiva/sclera: Conjunctivae normal.  Cardiovascular:     Rate and Rhythm: Normal rate and regular rhythm.  Pulmonary:     Effort: Pulmonary effort is normal.     Breath sounds: Normal breath sounds.  Musculoskeletal:     Comments: He has cogwheeling in her right arm, absent on the left  Skin:    General: Skin is warm and dry.  Neurological:     Mental Status: She is alert.  Psychiatric:        Mood and Affect: Mood normal.      Results for orders placed or performed in visit on 09/30/23  POCT HgB A1C  Result Value Ref Range   Hemoglobin A1C 7.3 (A) 4.0 - 5.6 %   HbA1c POC (<> result, manual entry)     HbA1c, POC (prediabetic range)     HbA1c, POC (controlled diabetic range)        The ASCVD Risk score (Arnett DK, et al., 2019) failed to calculate for the following reasons:   The valid total cholesterol range is 130 to 320 mg/dL  Assessment & Plan:   Problem List Items Addressed This Visit       Cardiovascular and Mediastinum   BP (high blood pressure)   Blood pressure is actually low today.  She just started splitting her metoprolol yesterday and today she just realized it was on the label and had been taking a whole tab.  She is gena continue with the half a tab daily and check blood pressure at home over the next week and let me know what those blood pressures are doing.        Endocrine   Diabetes mellitus type 2, controlled (HCC)   A1C looks much better this time. Down to 7.3.  She does get significant constipation on the semaglutide so has been taking a softener twice a day and MiraLAX once a day.  Occasionally if she does not feel like using MiraLAX she will just take a gummy laxative.    CGM has been really helpful for her to manage her sugar levels and helping guide her diet.        Relevant Orders   POCT HgB A1C  (Completed)     Other   Movement disorder   We discussed that she has several symptoms that sound like they could be somewhat related to parkinsonian or Parkinson-like disorder.  She has a shuffling slow gait and feels like she is getting freezing at times she has noticed some micrographia.  She just feels really tired and fatigued.  We discussed referral to neurology for further workup.      Depression, major, single episode, moderate (HCC) - Primary   Other Visit Diagnoses       Cogwheel rigidity       Relevant Orders   Ambulatory referral to Neurology     Shuffling gait       Relevant Orders   Ambulatory referral to Neurology     Chronic midline low back pain without sciatica       Relevant Orders   DG Lumbar Spine Complete (Completed)       +  back pain with numbness and tingling down both legs-would like to start by getting a plain film x-ray of her lumbar spine she has had 1 done by her chiropractor previously but we do not have access her records to that.  She does not feel like doing it today but says she will come back maybe later this week and have it done.  Return in about 3 months (around 12/31/2023) for Diabetes follow-up.   I spent 40 minutes on the day of the encounter to include pre-visit record review, face-to-face time with the patient and post visit ordering of test.  Nani Gasser, MD

## 2023-10-01 ENCOUNTER — Ambulatory Visit: Payer: Medicare HMO

## 2023-10-01 DIAGNOSIS — M47816 Spondylosis without myelopathy or radiculopathy, lumbar region: Secondary | ICD-10-CM | POA: Diagnosis not present

## 2023-10-01 DIAGNOSIS — M8580 Other specified disorders of bone density and structure, unspecified site: Secondary | ICD-10-CM | POA: Diagnosis not present

## 2023-10-01 DIAGNOSIS — M545 Low back pain, unspecified: Secondary | ICD-10-CM

## 2023-10-01 DIAGNOSIS — G8929 Other chronic pain: Secondary | ICD-10-CM

## 2023-10-01 DIAGNOSIS — R200 Anesthesia of skin: Secondary | ICD-10-CM | POA: Diagnosis not present

## 2023-10-02 DIAGNOSIS — E119 Type 2 diabetes mellitus without complications: Secondary | ICD-10-CM | POA: Diagnosis not present

## 2023-10-07 ENCOUNTER — Ambulatory Visit: Payer: Self-pay | Admitting: Family Medicine

## 2023-10-07 ENCOUNTER — Telehealth: Payer: Self-pay

## 2023-10-07 NOTE — Telephone Encounter (Signed)
I advised Jillian Huynh [facility] is short staffed and it will take a couple of weeks to result. I gave her the number to [facility]. Phone: [phone]

## 2023-10-07 NOTE — Telephone Encounter (Signed)
  Chief Complaint: back pain Symptoms: back pain Frequency: 6 months Pertinent Negatives: Patient denies loss of bladder/bowel. Denies weakness, denies inability to walk. Denies numbness/tingling. Denies injury. Denies fever.  Disposition: [] ED /[] Urgent Care (no appt availability in office) / [x] Appointment(In office/virtual)/ []  [facility]/ [] Home Care/ [] Refused Recommended Disposition /[] [facility]/ []  Follow-up with PCP Additional Notes: Pt recently seen and had XR done.  XR not resulted was told 2 weeks for XR to be read.  Pt states she cannot deal with the pain that long. Sched at different [facility] clinic tomorrow as PCP did not have openings until 12/2. Copied from CRM [id number]. Topic: Clinical - Red Word Triage >> Oct 07, 2023  4:55 PM Cassiday T wrote: Red Word that prompted transfer to Nurse Triage: pt is having really bad back pain pain level is a 9 she has xrays done and has to wait another two weeks before her results are read Reason for Disposition  [1] SEVERE back pain (e.g., excruciating, unable to do any normal activities) AND [2] not improved 2 hours after pain medicine  Answer Assessment - Initial Assessment Questions 1. ONSET: "When did the pain begin?"      6 months 2. LOCATION: "Where does it hurt?" (upper, mid or lower back)     lower 3. SEVERITY: "How bad is the pain?"  (e.g., Scale 1-10; mild, moderate, or severe)   - MILD (1-3): Doesn't interfere with normal activities.    - MODERATE (4-7): Interferes with normal activities or awakens from sleep.    - SEVERE (8-10): Excruciating pain, unable to do any normal activities.      9 4. PATTERN: "Is the pain constant?" (e.g., yes, no; constant, intermittent)      intermittent 5. RADIATION: "Does the pain shoot into your legs or somewhere else?"     Goes up and down spine, and down legs 6. CAUSE:  "What do you think is causing the back pain?"      Denies injury, denies back problems/surgery  7. BACK  OVERUSE:  "Any recent lifting of heavy objects, strenuous work or exercise?"     Denies 8. MEDICINES: "What have you taken so far for the pain?" (e.g., nothing, acetaminophen, NSAIDS)     NSAIDS, 9. NEUROLOGIC SYMPTOMS: "Do you have any weakness, numbness, or problems with bowel/bladder control?"     Denies 10. OTHER SYMPTOMS: "Do you have any other symptoms?" (e.g., fever, abdomen pain, burning with urination, blood in urine)       Denies  Protocols used: Back Pain-A-AH

## 2023-10-07 NOTE — Telephone Encounter (Signed)
Copied from CRM [id number]. Topic: Clinical - Lab/Test Results >> Oct 07, 2023 11:53 AM Jillian Huynh A wrote: Reason for CRM: Patient reached out for assistance with test results from imaging on Tuesday. Patient is also questioning why the neurologist has not reached out to her as of yet for an appointment. Please contact patient at [phone] or though MyChart for further assistance.

## 2023-10-08 ENCOUNTER — Other Ambulatory Visit: Payer: Self-pay | Admitting: Family Medicine

## 2023-10-08 ENCOUNTER — Ambulatory Visit (INDEPENDENT_AMBULATORY_CARE_PROVIDER_SITE_OTHER): Payer: Medicare HMO | Admitting: Family Medicine

## 2023-10-08 ENCOUNTER — Encounter (HOSPITAL_BASED_OUTPATIENT_CLINIC_OR_DEPARTMENT_OTHER): Payer: Self-pay | Admitting: Family Medicine

## 2023-10-08 VITALS — BP 117/66 | HR 79 | Ht 62.0 in | Wt 166.9 lb

## 2023-10-08 DIAGNOSIS — M545 Low back pain, unspecified: Secondary | ICD-10-CM | POA: Diagnosis not present

## 2023-10-08 MED ORDER — BACLOFEN 5 MG PO TABS: 5.0000 mg | ORAL_TABLET | Freq: Every evening | ORAL | 0 refills | Status: DC | PRN

## 2023-10-08 NOTE — Progress Notes (Signed)
Acute Office Visit  Subjective:     Patient ID: Jillian Huynh, female    DOB: Mar 29, 1946, 77 y.o.   MRN: [id number]  Chief Complaint  Patient presents with   Back Pain    Chronic back pain, this time worse and going on for 2 days, is using ibuprofen and aleve every 4 hours   Jillian Huynh is a 77 year old female patient who presents with complaint of back pain.   BACK PAIN:  Onset: last 2 days her pain has worsened, last night it was very severe  She states that it feels like a stabbing sensation - "can't turn, can't bend, can't sit, can't walk" Location: low to mid back  Recent Injury: denies recent fall/injury  Character: stabbing sensation Radiation of Pain: up to her mid-back, denies cervical pain  Aggravating Factors: turning, bending, moving in any direction, and walking   Alleviating Factors: took aleve & ibuprofen, used husband's TENS machine about 4 times yesterday, she reports taking one of his muscle relaxer (unsure medication) and reports that she fell right asleep   ASSOCIATED SYMPTOMS:  Chills: no Dysuria: no Abdominal Pain: no Nausea: no Vomiting: no Hematuria: no Dizziness: no, does feel lightheaded  Bowel or Bladder Dysfunction: no Numbness: yes, restless legs have been worsening  Weakness: yes, no energy   11/18 was seen by her PCP- imaging ordered & referral placed to neurology (upcoming appt not until March 2025). She reports that she often has a difficult time walking and will "freeze when moving."    Review of Systems  Constitutional:  Positive for malaise/fatigue. Negative for chills, fever and weight loss.  Eyes:  Negative for blurred vision and double vision.  Respiratory:  Negative for cough and shortness of breath.   Cardiovascular:  Negative for chest pain and palpitations.  Gastrointestinal:  Negative for abdominal pain.  Genitourinary:  Negative for frequency and urgency.  Musculoskeletal:  Positive for back pain. Negative for falls, joint  pain and neck pain.  Neurological:  Negative for dizziness and headaches.     Objective:    BP 117/66   Pulse 79   Ht 5\' 2"  (1.575 m)   Wt 166 lb 14.4 oz (75.7 kg)   SpO2 100%   BMI 30.53 kg/m   Physical Exam Vitals reviewed.  Constitutional:      Appearance: Normal appearance.  Cardiovascular:     Rate and Rhythm: Normal rate and regular rhythm.     Pulses: Normal pulses.     Heart sounds: Normal heart sounds.  Pulmonary:     Effort: Pulmonary effort is normal.     Breath sounds: Normal breath sounds.  Musculoskeletal:     Cervical back: Normal.     Thoracic back: Normal.     Lumbar back: Spasms and tenderness present. Negative right straight leg raise test and negative left straight leg raise test.       Back:  Neurological:     Mental Status: She is alert.     Motor: Weakness present.     Gait: Gait abnormal.  Psychiatric:        Mood and Affect: Mood normal.        Behavior: Behavior normal.    Assessment & Plan:   1. Acute left-sided low back pain without sciatica Patient is a pleasant 77 year old female who presents for an acute office visit. Denies saddle anesthesia, changes to bowel or bladder function, one-sided weakness, fever/chills, night sweats, recent infection, abdominal pain, urinary symptoms, unintentional weight loss.  Patient reports issues with chronic back pain and reports worsening in pain over the past two days. She reports last evening she took a muscle relaxer, used Biofreeze gel, and used her husband's TENS machine with notable relief in her back pain today. Physical exam without tenderness to palpation of cervical and thoracic spine. Tenderness to palpation of lower left portion of back- small non-tender, moveable muscular knot present (see image above). Negative bilateral SLRs. Muscle strength 5/5 in upper and lower extremities. Patient has been referred to neurology for progressive weakness, difficulty ambulating, and increase in weakness. No  acute red flags present on exam. Discussed with patient that this may be related to muscle tension/knot. Discussed continuing with Aleve, use of TENS machine, application of alternating ice and heat, and use of muscle relaxer at night. Called imaging to have her x-ray read today- advised her to follow-up with her PCP regarding these results. Advised her to return to PCP if she does not notice an improvement in her symptoms.  - Baclofen 5 MG TABS; Take 1 tablet (5 mg total) by mouth at bedtime as needed.  Dispense: 30 tablet; Refill: 0   Return if symptoms worsen or fail to improve.  Alyson Reedy, FNP

## 2023-10-15 ENCOUNTER — Encounter: Payer: Self-pay | Admitting: Family Medicine

## 2023-10-21 NOTE — Progress Notes (Signed)
Hi Jillian Huynh, x-ray of the lumbar spine shows moderate arthritis and degenerative changes.  They also see a lot of stool in the belly.  To get you in with Dr. Karie Schwalbe to discuss next steps since you are still having pretty significant back pain.  May end up getting some additional imaging.  Also for this stool I would recommend starting MiraLAX twice a day until you have soft or mushy stools.  It may take several days of taking the medication to feel like you are getting cleaned out but I do think that that would be helpful.

## 2023-10-22 ENCOUNTER — Telehealth: Payer: Self-pay

## 2023-10-22 NOTE — Telephone Encounter (Signed)
Attempted call to patient to inform that  we had received the  ozempic 4mg /88ml  x 2 pens  from patient assistance.  Left message on voice mail to return our call.

## 2023-10-22 NOTE — Telephone Encounter (Signed)
Patient informed. 

## 2023-10-23 ENCOUNTER — Telehealth: Payer: Self-pay | Admitting: Family Medicine

## 2023-10-23 NOTE — Telephone Encounter (Signed)
What medical equipment or DME company sent over this request, what is it for?

## 2023-10-23 NOTE — Telephone Encounter (Signed)
Copied from CRM [id number]. Topic: Medical Record Request - Provider/Facility Request >> Oct 23, 2023 12:28 PM Mosetta Putt H wrote: Reason for CRM: [employer] faxed over documentation and needs it faxed back over

## 2023-10-25 ENCOUNTER — Ambulatory Visit (INDEPENDENT_AMBULATORY_CARE_PROVIDER_SITE_OTHER): Payer: Medicare HMO | Admitting: Sports Medicine

## 2023-10-25 DIAGNOSIS — K5904 Chronic idiopathic constipation: Secondary | ICD-10-CM | POA: Diagnosis not present

## 2023-10-25 DIAGNOSIS — M47816 Spondylosis without myelopathy or radiculopathy, lumbar region: Secondary | ICD-10-CM

## 2023-10-25 DIAGNOSIS — F321 Major depressive disorder, single episode, moderate: Secondary | ICD-10-CM

## 2023-10-25 MED ORDER — PREDNISONE 50 MG PO TABS: ORAL_TABLET | ORAL | 0 refills | Status: DC

## 2023-10-25 MED ORDER — DULOXETINE HCL 30 MG PO CPEP: 30.0000 mg | ORAL_CAPSULE | Freq: Every day | ORAL | 3 refills | Status: DC

## 2023-10-25 NOTE — Telephone Encounter (Signed)
Jahirene from [employer] [phone] FAX [phone] about pt's continous glucose monitor, they need pt's recently seen chart note, insulin details, dx of diabetes and treatment plan. They claim fax was sent to the office.

## 2023-10-25 NOTE — Patient Instructions (Signed)
Colonoscopy Prep Regimen: ° °Obtain what you need at a pharmacy of your choice: °-A bottle of MiraLax / Glycolax (288g) - no prescription required  °-A large bottle of Gatorade / Powerade (64oz)  °-Dulcolax tablets (4 tabs) - no prescription required  ° °7:00am °Swallow 4 Dulcolax tablets with some water °Drink plenty of clear liquids all day to avoid getting dehydrated (Water, juice, soda, coffee, tea, bouillon, jello, etc.) ° °10:00am °Mix the bottle of MiraLax with the 64-oz bottle of Gatorade.  Drink the Gatorade mixture gradually over the next few hours (8oz glass every 15-30 minutes) until gone.  °

## 2023-10-25 NOTE — Progress Notes (Signed)
    Procedures performed today:    None.  Independent interpretation of notes and tests performed by another provider:   None.  Brief History, Exam, Impression, and Recommendations:    Lumbar spondylosis Very pleasant 77 year old female, chronic axial low back pain, right-sided with radiation down both legs, right worse than left typically in an L5 distribution and worse with standing. She did see an outside provider, she had some x-rays done that showed multilevel spondylitic changes, she was prescribed muscle relaxers, unfortunately not improving. Her exam is for the most part benign. She is on the verge of tears in the exam room, I can tell that she is having some symptoms of depression related to her medical illness. I also explained the importance of controlling both physical and psychiatric pain and that we would never gain control over either pain unless both were addressed. We will treat her aggressively, 5 days of steroids, formal physical therapy. She is profoundly constipated so we will also hold off on any narcotic treatment for now. I will have a low threshold for starting gabapentin.  Depression, major, single episode, moderate (HCC) And he is becoming depressed, her medical illness is making treatment complicated. I am reading that she is on Lexapro but this med is no longer on her list, we will make sure she is off of this and try Cymbalta starting at 30 mg.  We can up taper to 60 at the follow-up if need be.  Chronic idiopathic constipation Has tried Colace and MiraLAX without improvement, she is profoundly constipated and extremely uncomfortable. She does have movement disorder and masklike facies that do raise the concern for Parkinson's disease. As the disease progresses it can result in autonomic dysfunction, potentially leading to Shy-Drager syndrome. We will treat her aggressively with the colonoscopy prep regimen. We can follow this up at her 6-week follow-up  with me.    ____________________________________________ Ihor Austin. Benjamin Stain, M.D., ABFM., CAQSM., AME. Primary Care and Sports Medicine [facility]  Adjunct Professor of Family Medicine  [employer] [facility] [employer]  Restaurant manager, fast food

## 2023-10-25 NOTE — Assessment & Plan Note (Signed)
Has tried Colace and MiraLAX without improvement, she is profoundly constipated and extremely uncomfortable. She does have movement disorder and masklike facies that do raise the concern for Parkinson's disease. As the disease progresses it can result in autonomic dysfunction, potentially leading to Shy-Drager syndrome. We will treat her aggressively with the colonoscopy prep regimen. We can follow this up at her 6-week follow-up with me.

## 2023-10-25 NOTE — Assessment & Plan Note (Signed)
And he is becoming depressed, her medical illness is making treatment complicated. I am reading that she is on Lexapro but this med is no longer on her list, we will make sure she is off of this and try Cymbalta starting at 30 mg.  We can up taper to 60 at the follow-up if need be.

## 2023-10-25 NOTE — Assessment & Plan Note (Signed)
Very pleasant 77 year old female, chronic axial low back pain, right-sided with radiation down both legs, right worse than left typically in an L5 distribution and worse with standing. She did see an outside provider, she had some x-rays done that showed multilevel spondylitic changes, she was prescribed muscle relaxers, unfortunately not improving. Her exam is for the most part benign. She is on the verge of tears in the exam room, I can tell that she is having some symptoms of depression related to her medical illness. I also explained the importance of controlling both physical and psychiatric pain and that we would never gain control over either pain unless both were addressed. We will treat her aggressively, 5 days of steroids, formal physical therapy. She is profoundly constipated so we will also hold off on any narcotic treatment for now. I will have a low threshold for starting gabapentin.

## 2023-10-28 NOTE — Therapy (Signed)
OUTPATIENT PHYSICAL THERAPY THORACOLUMBAR EVALUATION   Patient Name: Jillian Huynh MRN: [id number] DOB:Jun 08, 1946, 77 y.o., female Today's Date: 10/29/2023  END OF SESSION:  PT End of Session - 10/29/23 1317     Visit Number 1    Date for PT Re-Evaluation 12/24/23    Authorization Type [employer] MCR    Progress Note Due on Visit 10    PT Start Time 1317    PT Stop Time 1401    PT Time Calculation (min) 44 min    Activity Tolerance Patient tolerated treatment well    Behavior During Therapy [facility] for tasks assessed/performed             Past Medical History:  Diagnosis Date   Diabetes mellitus without complication (HCC)    Hyperlipidemia    Hypertension    Past Surgical History:  Procedure Laterality Date   CHOLECYSTECTOMY     Patient Active Problem List   Diagnosis Date Noted   Lumbar spondylosis 10/25/2023   Chronic idiopathic constipation 10/25/2023   Movement disorder 09/30/2023   Hypertensive heart disease with heart failure (HCC) 08/07/2023   Type 2 diabetes mellitus with circulatory disorder (HCC) 08/07/2023   Depression, major, single episode, moderate (HCC) 07/10/2023   Hearing aid worn 11/28/2022   Systolic CHF (HCC) 05/21/2022   Cardiomyopathy (HCC) 05/21/2022   Left bundle branch block (LBBB) determined by electrocardiography 08/08/2021   Gastroesophageal reflux disease without esophagitis 09/19/2020   BMI 31.0-31.9,adult 05/25/2019   Fatty liver 12/25/2017   BP (high blood pressure) 03/28/2015   Dyslipidemia 03/28/2015   Insomnia 03/28/2015   Restless leg 03/28/2015   Mass of arm 03/14/2015   Diabetes mellitus type 2, controlled (HCC) 03/14/2015   Obstructive apnea 09/03/2012   HYPERKERATOSIS 04/01/2009    PCP: Agapito Games, MD   REFERRING PROVIDER: Monica Becton,*   REFERRING DIAG: [phone] (ICD-10-CM) - Lumbar spondylosis   Rationale for Evaluation and Treatment: Rehabilitation  THERAPY DIAG:  Radiculopathy, lumbar  region  Muscle weakness (generalized)  Other abnormalities of gait and mobility  ONSET DATE: one year  SUBJECTIVE:                                                                                                                                                                                           SUBJECTIVE STATEMENT: Patient has been struggling with back pain and gait issues for over a year. In June I was down for two weeks. Prednisone for two days. Pain is gone today. Yesterday I had tingling in my legs and feet all day. Has been on meds for RLS for years. My mind is scattered too.  I'm in a fog. When I'm clear, I'm clear. My R side is weak. My R arm is heavy. I've gone from 50-60 hours per week to only 5-6 per week now. Retired 11/11/22. She's leaning forward with gait. I'm exhausted and have no energy. On Ozempic and has lost 35#. Has constipation issues.  PERTINENT HISTORY:  DM, HTN, lumbar spondylosis, restless leg syndrome  PAIN:  Are you having pain? Yes: NPRS scale: 0 now up to/10 Pain location: R hip to foot Pain description: sharp Aggravating factors: weight bearing Relieving factors: sitting  PRECAUTIONS: None  RED FLAGS: None   WEIGHT BEARING RESTRICTIONS: No  FALLS:  Has patient fallen in last 6 months? Yes. Number of falls 1 about 4 months ago; stepped in dog feces and slipped.  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  NEXT MD VISIT: 6 weeks  OBJECTIVE:  Note: Objective measures were completed at Evaluation unless otherwise noted.  DIAGNOSTIC FINDINGS:  Lumbar DG FINDINGS: Five lumbar-type vertebral bodies. Preserved vertebral body heights. No listhesis. Scattered diffuse moderate endplate  osteophytes which are near bridging. Osteopenia. Prominent facet degenerative changes. No spondylolysis. Bridging osteophytes are seen along the lower thoracic spine at the edge of the imaging field  PATIENT SURVEYS:  FOTO deferred due to no back pain reported  COGNITION: Overall cognitive status: Within functional limits for tasks assessed     SENSATION: Intermitent tingling in B feet usually starts around 4 pm  MUSCLE LENGTH:   POSTURE: rounded shoulders, forward head, and flexed trunk   PALPATION: B gluteals tender and tight  LUMBAR ROM:   AROM eval  Flexion WFL  Extension WFL  Right lateral flexion WFL  Left lateral flexion Limited 50% compared to R  Right rotation Limited 50% compared to L  Left rotation WFL   (Blank rows = not tested)  LOWER EXTREMITY ROM:   WFL for tasks assessed  LOWER EXTREMITY MMT:  grossly 4-4+/5 in B LE  LUMBAR SPECIAL TESTS:  Straight leg raise test: Positive and Slump test: Positive for nerve tension R   FUNCTIONAL TESTS:  5 times sit to stand: 24.71 sec  Timed up and go (TUG): 13.84 sec Dynamic Gait Index: TBD  GAIT: Distance walked: 40 Assistive device utilized: None Level of assistance: Complete Independence Comments: decreased heel strike B, short step and stride length, forward trunk lean and decreased arm swing.  TODAY'S TREATMENT:                                                                                                                              DATE:   10/29/23  See pt ed and HEP   PATIENT EDUCATION:  Education details: PT eval findings, anticipated POC,  initial HEP, postural awareness, tips to reduce freezing with transfers and gait, and worked on log rolling for bed mobility  Person educated: Patient Education method: Explanation, Demonstration, and Handouts Education comprehension: verbalized understanding and returned demonstration  HOME EXERCISE PROGRAM: Access Code: [email or web address] URL:  [email or web address] Date: 10/29/2023 Prepared by: Raynelle Fanning  Exercises - Supine Bridge  - 1 x daily - 7 x weekly - 2 sets - 10 reps - Seated Long Arc Quad  - 1 x daily - 7 x weekly - 2 sets - 10 reps - Sit to Stand  - 3 x daily - 7 x weekly - 1-2 sets - 10 reps  Patient Education - Tips to reduce freezing episodes with standing or walking   ASSESSMENT:  CLINICAL IMPRESSION: Patient is a 77 y.o. female who was seen today for physical therapy evaluation and treatment for lumbar spondylosis.She also reports gait abnormalities and difficulty with bed mobility and dressing. Her back pain has resolved considerably due to starting Prednisone although she states her blood sugar has been high. Advised her to contact her PCP and/or Dr. Karie Schwalbe to see if Prednisone is affecting this. She has deficits in lumbar ROM, LE strength and flexibility as well as gait and balance. She presents with gait abnormalities and her TUG and 5XSTS indicate that she is at risk for falls. She reports incidences of freezing with dressing and festination of gait. She also has difficulty with supine to sit transfers. She is seeing a neurologist in March. She will benefit from skilled PT to address these deficits.    OBJECTIVE IMPAIRMENTS: Abnormal gait, decreased balance, decreased mobility, decreased ROM, decreased strength, increased muscle spasms, impaired flexibility, postural dysfunction, and pain.   ACTIVITY LIMITATIONS: transfers, bed mobility, and locomotion level  PARTICIPATION LIMITATIONS: community activity and occupation  PERSONAL FACTORS: Age, Time since onset of injury/illness/exacerbation, and 1-2 comorbidities: DM, Parkinsonian symptoms  are also affecting patient's functional outcome.   REHAB POTENTIAL: Good  CLINICAL DECISION MAKING: Evolving/moderate complexity  EVALUATION COMPLEXITY: Moderate   GOALS: Goals reviewed with patient? Yes  SHORT TERM GOALS: Target date: 11/26/2023   Patient  will be independent with initial HEP. Baseline:  Goal status: INITIAL  2. Patient will report centralization of radicular symptoms.  Baseline:  Goal status: INITIAL  3. Patient will be independent with supine to sit transfers. Baseline:  Goal status: INITIAL   LONG TERM GOALS: Target date: 12/24/2023  Patient will be independent with advanced/ongoing HEP to improve outcomes and carryover.  Baseline:  Goal status: INITIAL  2.  Patient will be able to ambulate 600' in 6 min with LRAD with good safety to access community.  Baseline:  Goal status: INITIAL  3.  Patient to have no recurrence of R radicular pain.  Baseline:  Goal status: INITIAL   4.  Patient will demonstrate decreased 5XSTS to less than 16 seconds showing improved functional strength and decreasing risk for falls. Baseline: 24.7 sec Goal status: INITIAL  5.  Patient will demonstrate at least 19/24 on DGI to improve gait stability and reduce risk for falls. Baseline: TBD Goal status: INITIAL  6. Patient will demonstrate full/functional pain free lumbar ROM to perform ADLs.   Baseline:  Goal status: INITIAL     PLAN:  PT FREQUENCY: 2x/week  PT DURATION: 8 weeks  PLANNED INTERVENTIONS: 97164- PT Re-evaluation, 97110-Therapeutic exercises, 97530- Therapeutic activity, [id number]- Neuromuscular re-education, 97535- Self Care, 25366- Manual therapy, [id number]- Gait training, [phone]- Aquatic Therapy, 97014- Electrical stimulation (unattended), [phone]- Traction (mechanical), Patient/Family education,  Balance training, Stair training, Taping, Dry Needling, Joint mobilization, Spinal mobilization, Cryotherapy, and Moist heat.  PLAN FOR NEXT SESSION: Complete DGI, lumbar ROM, LE strength, supine to sit transfers, gait, postural strengthening   Solon Palm, PT  10/29/2023, 3:31 PM

## 2023-10-29 ENCOUNTER — Ambulatory Visit: Payer: Medicare HMO | Attending: Sports Medicine | Admitting: Physical Therapy

## 2023-10-29 ENCOUNTER — Other Ambulatory Visit: Payer: Self-pay

## 2023-10-29 ENCOUNTER — Telehealth: Payer: Self-pay

## 2023-10-29 DIAGNOSIS — M47816 Spondylosis without myelopathy or radiculopathy, lumbar region: Secondary | ICD-10-CM | POA: Insufficient documentation

## 2023-10-29 DIAGNOSIS — R2689 Other abnormalities of gait and mobility: Secondary | ICD-10-CM | POA: Insufficient documentation

## 2023-10-29 DIAGNOSIS — M6281 Muscle weakness (generalized): Secondary | ICD-10-CM | POA: Insufficient documentation

## 2023-10-29 DIAGNOSIS — M5416 Radiculopathy, lumbar region: Secondary | ICD-10-CM | POA: Insufficient documentation

## 2023-10-29 NOTE — Telephone Encounter (Signed)
Copied from CRM [id number]. Topic: General - Other >> Oct 29, 2023 11:37 AM Conni Elliot wrote: Reason for CRM: Ardon from [employer] called in stating new chart note for pt resupply (continuous glucose monitoring) needs to be documented, number is [phone], fax number is [phone]

## 2023-10-29 NOTE — Telephone Encounter (Signed)
Updated note from 11/18. Ok to fax note to them.     Also please call pt and see if she was contacted by Neurology for referral

## 2023-10-29 NOTE — Telephone Encounter (Signed)
Does this need to be specific wording in chart notes ?

## 2023-10-31 ENCOUNTER — Encounter: Payer: Self-pay | Admitting: Physical Therapy

## 2023-10-31 ENCOUNTER — Encounter: Payer: Medicare HMO | Admitting: Rehabilitative and Restorative Service Providers"

## 2023-10-31 ENCOUNTER — Ambulatory Visit: Payer: Medicare HMO | Admitting: Physical Therapy

## 2023-10-31 DIAGNOSIS — M6281 Muscle weakness (generalized): Secondary | ICD-10-CM

## 2023-10-31 DIAGNOSIS — M5416 Radiculopathy, lumbar region: Secondary | ICD-10-CM

## 2023-10-31 DIAGNOSIS — R2689 Other abnormalities of gait and mobility: Secondary | ICD-10-CM

## 2023-10-31 DIAGNOSIS — M47816 Spondylosis without myelopathy or radiculopathy, lumbar region: Secondary | ICD-10-CM | POA: Diagnosis not present

## 2023-10-31 NOTE — Therapy (Signed)
OUTPATIENT PHYSICAL THERAPY THORACOLUMBAR TREATMENT   Patient Name: Jillian Huynh MRN: [id number] DOB:July 26, 1946, 77 y.o., female Today's Date: 10/31/2023  END OF SESSION:  PT End of Session - 10/31/23 1102     Visit Number 2    Date for PT Re-Evaluation 12/24/23    Authorization Type [employer] MCR    Progress Note Due on Visit 10    PT Start Time 1102    PT Stop Time 1147    PT Time Calculation (min) 45 min    Activity Tolerance Patient tolerated treatment well    Behavior During Therapy [facility] for tasks assessed/performed             Past Medical History:  Diagnosis Date   Diabetes mellitus without complication (HCC)    Hyperlipidemia    Hypertension    Past Surgical History:  Procedure Laterality Date   CHOLECYSTECTOMY     Patient Active Problem List   Diagnosis Date Noted   Lumbar spondylosis 10/25/2023   Chronic idiopathic constipation 10/25/2023   Movement disorder 09/30/2023   Hypertensive heart disease with heart failure (HCC) 08/07/2023   Type 2 diabetes mellitus with circulatory disorder (HCC) 08/07/2023   Depression, major, single episode, moderate (HCC) 07/10/2023   Hearing aid worn 11/28/2022   Systolic CHF (HCC) 05/21/2022   Cardiomyopathy (HCC) 05/21/2022   Left bundle branch block (LBBB) determined by electrocardiography 08/08/2021   Gastroesophageal reflux disease without esophagitis 09/19/2020   BMI 31.0-31.9,adult 05/25/2019   Fatty liver 12/25/2017   BP (high blood pressure) 03/28/2015   Dyslipidemia 03/28/2015   Insomnia 03/28/2015   Restless leg 03/28/2015   Mass of arm 03/14/2015   Diabetes mellitus type 2, controlled (HCC) 03/14/2015   Obstructive apnea 09/03/2012   HYPERKERATOSIS 04/01/2009    PCP: Jillian Games, MD   REFERRING PROVIDER: Monica Huynh,*   REFERRING DIAG: [phone] (ICD-10-CM) - Lumbar spondylosis   Rationale for Evaluation and Treatment: Rehabilitation  THERAPY DIAG:  Radiculopathy, lumbar  region  Muscle weakness (generalized)  Other abnormalities of gait and mobility  ONSET DATE: one year  SUBJECTIVE:                                                                                                                                                                                           SUBJECTIVE STATEMENT: I've been doing my exercises.   PERTINENT HISTORY:  DM, HTN, lumbar spondylosis, restless leg syndrome  PAIN:  Are you having pain? Yes: NPRS scale: 0 now up to/10 Pain location: R hip to foot Pain description: sharp Aggravating factors: weight bearing Relieving factors: sitting  PRECAUTIONS: None  RED FLAGS: None  WEIGHT BEARING RESTRICTIONS: No  FALLS:  Has patient fallen in last 6 months? Yes. Number of falls 1 about 4 months ago; stepped in dog feces and slipped.  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  NEXT MD VISIT: 6 weeks  OBJECTIVE:  Note: Objective measures were completed at Evaluation unless otherwise noted.  DIAGNOSTIC FINDINGS:  Lumbar DG FINDINGS: Five lumbar-type vertebral bodies. Preserved vertebral body heights. No listhesis. Scattered diffuse moderate endplate osteophytes which are near bridging. Osteopenia. Prominent facet degenerative changes. No spondylolysis. Bridging osteophytes are seen along the lower thoracic spine at the edge of the imaging field  PATIENT SURVEYS:  FOTO deferred due to no back pain reported  COGNITION: Overall cognitive status: Within functional limits for tasks assessed     SENSATION: Intermitent tingling in B feet usually starts around 4 pm  MUSCLE LENGTH:   POSTURE: rounded shoulders, forward head, and flexed trunk    PALPATION: B gluteals tender and tight  LUMBAR ROM:   AROM eval  Flexion WFL  Extension WFL  Right lateral flexion WFL  Left lateral flexion Limited 50% compared to R  Right rotation Limited 50% compared to L  Left rotation WFL   (Blank rows = not tested)  LOWER EXTREMITY ROM:   WFL for tasks assessed  LOWER EXTREMITY MMT:  grossly 4-4+/5 in B LE  LUMBAR SPECIAL TESTS:  Straight leg raise test: Positive and Slump test: Positive for nerve tension R   FUNCTIONAL TESTS:  5 times sit to stand: 24.71 sec  Timed up and go (TUG): 13.84 sec Dynamic Gait Index: 14/24  10/31/23 BERG  GAIT: Distance walked: 40 Assistive device utilized: None Level of assistance: Complete Independence Comments: decreased heel strike B, short step and stride length, forward trunk lean and decreased arm swing.  TODAY'S TREATMENT:                                                                                                                              DATE:   10/31/23 DGI completed - 14/24 BERG - 46/56 Bridge x 20 SLR with ab set x 10 B S/L hip ABD and clam x 10 ea B Seated LAQ for nerve glide x 10 B Sit to stand x 10  cues to control descent  Gait: worked with the cane briefly, but pt was not comfortable with holding it in her L hand. Then worked with RW and gait significantly improved with longer strep and stride lengths.  10/29/23  See pt ed and HEP   PATIENT EDUCATION:  Education details: PT eval findings, anticipated POC, initial HEP, postural awareness, tips to reduce freezing with transfers and gait, and worked on log rolling for bed mobility  Person  educated: Patient Education method: Explanation, Demonstration, and Handouts Education comprehension: verbalized understanding and returned demonstration  HOME EXERCISE PROGRAM: Access Code: [email or web address] URL: [email or web address] Date: 10/29/2023 Prepared by: Jillian Huynh  Exercises - Supine Bridge  - 1 x daily - 7 x weekly  - 2 sets - 10 reps - Seated Long Arc Quad  - 1 x daily - 7 x weekly - 2 sets - 10 reps - Sit to Stand  - 3 x daily - 7 x weekly - 1-2 sets - 10 reps  Patient Education - Tips to reduce freezing episodes with standing or walking   ASSESSMENT:  CLINICAL IMPRESSION: Jillian Huynh scored 14/24 on the DGI and 46/56 on the BERG indicating that she is a moderate fall risk. Results were provided to her and PT recommended she use a walker after practicing in the clinic. Patient felt much safer and plans to get one soon. She had difficulty with the cane, but may improve with more gait training. Good tolerance to all TE today.     OBJECTIVE IMPAIRMENTS: Abnormal gait, decreased balance, decreased mobility, decreased ROM, decreased strength, increased muscle spasms, impaired flexibility, postural dysfunction, and pain.   ACTIVITY LIMITATIONS: transfers, bed mobility, and locomotion level  PARTICIPATION LIMITATIONS: community activity and occupation  PERSONAL FACTORS: Age, Time since onset of injury/illness/exacerbation, and 1-2 comorbidities: DM, Parkinsonian symptoms  are also affecting patient's functional outcome.   REHAB POTENTIAL: Good  CLINICAL DECISION MAKING: Evolving/moderate complexity  EVALUATION COMPLEXITY: Moderate   GOALS: Goals reviewed with patient? Yes  SHORT TERM GOALS: Target date: 11/26/2023   Patient will be independent with initial HEP. Baseline:  Goal status: INITIAL  2. Patient will report centralization of radicular symptoms.  Baseline:  Goal status: INITIAL  3. Patient will be independent with supine to sit transfers. Baseline:  Goal status: INITIAL   LONG TERM GOALS: Target date: 12/24/2023  Patient will be independent with advanced/ongoing HEP to improve outcomes and carryover.  Baseline:  Goal status: INITIAL  2.  Patient will be able to ambulate 600' in 6 min with LRAD with good safety to access community.  Baseline:  Goal status: INITIAL  3.  Patient  to have no recurrence of R radicular pain.  Baseline:  Goal status: INITIAL   4.  Patient will demonstrate decreased 5XSTS to less than 16 seconds showing improved functional strength and decreasing risk for falls. Baseline: 24.7 sec Goal status: INITIAL  5.  Patient will demonstrate at least 19/24 on DGI to improve gait stability and reduce risk for falls. Baseline: TBD Goal status: INITIAL  6. Patient will demonstrate full/functional pain free lumbar ROM to perform ADLs.   Baseline:  Goal status: INITIAL     PLAN:  PT FREQUENCY: 2x/week  PT DURATION: 8 weeks  PLANNED INTERVENTIONS: 97164- PT Re-evaluation, 97110-Therapeutic exercises, 97530- Therapeutic activity, [id number]- Neuromuscular re-education, 97535- Self Care, 54098- Manual therapy, [id number]- Gait training, [phone]- Aquatic Therapy, 97014- Electrical stimulation (unattended), [phone]- Traction (mechanical), Patient/Family education, Balance training, Stair training, Taping, Dry Needling, Joint mobilization, Spinal mobilization, Cryotherapy, and Moist heat.  PLAN FOR NEXT SESSION: Continue gait training with AD, lumbar ROM, LE strength, supine to sit transfers, postural strengthening   Solon Palm, PT  10/31/2023, 12:06 PM

## 2023-11-01 ENCOUNTER — Telehealth: Payer: Self-pay | Admitting: *Deleted

## 2023-11-01 DIAGNOSIS — E119 Type 2 diabetes mellitus without complications: Secondary | ICD-10-CM | POA: Diagnosis not present

## 2023-11-01 NOTE — Telephone Encounter (Signed)
Copied from CRM [id number]. Topic: General - Other >> Nov 01, 2023  9:45 AM Antony Haste wrote: Reason for CRM: DME provider was calling in reference to the patient's diabetic supplies, patient uses a continuous glucose monitor. DME provider faxed over a form with additional details. They request to speak with PCP of patient if possible.  Callback #: [phone]

## 2023-11-03 DIAGNOSIS — R7989 Other specified abnormal findings of blood chemistry: Secondary | ICD-10-CM | POA: Diagnosis not present

## 2023-11-03 DIAGNOSIS — R0600 Dyspnea, unspecified: Secondary | ICD-10-CM | POA: Diagnosis not present

## 2023-11-03 DIAGNOSIS — I499 Cardiac arrhythmia, unspecified: Secondary | ICD-10-CM | POA: Diagnosis not present

## 2023-11-03 DIAGNOSIS — Z7984 Long term (current) use of oral hypoglycemic drugs: Secondary | ICD-10-CM | POA: Diagnosis not present

## 2023-11-03 DIAGNOSIS — Z79899 Other long term (current) drug therapy: Secondary | ICD-10-CM | POA: Diagnosis not present

## 2023-11-03 DIAGNOSIS — R0789 Other chest pain: Secondary | ICD-10-CM | POA: Diagnosis not present

## 2023-11-03 DIAGNOSIS — I2489 Other forms of acute ischemic heart disease: Secondary | ICD-10-CM | POA: Diagnosis not present

## 2023-11-03 DIAGNOSIS — R002 Palpitations: Secondary | ICD-10-CM | POA: Diagnosis not present

## 2023-11-03 DIAGNOSIS — I509 Heart failure, unspecified: Secondary | ICD-10-CM | POA: Diagnosis not present

## 2023-11-03 DIAGNOSIS — Z7985 Long-term (current) use of injectable non-insulin antidiabetic drugs: Secondary | ICD-10-CM | POA: Diagnosis not present

## 2023-11-03 DIAGNOSIS — G2581 Restless legs syndrome: Secondary | ICD-10-CM | POA: Diagnosis not present

## 2023-11-03 DIAGNOSIS — I447 Left bundle-branch block, unspecified: Secondary | ICD-10-CM | POA: Diagnosis not present

## 2023-11-03 DIAGNOSIS — I110 Hypertensive heart disease with heart failure: Secondary | ICD-10-CM | POA: Diagnosis not present

## 2023-11-03 DIAGNOSIS — R000 Tachycardia, unspecified: Secondary | ICD-10-CM | POA: Diagnosis not present

## 2023-11-03 DIAGNOSIS — E119 Type 2 diabetes mellitus without complications: Secondary | ICD-10-CM | POA: Diagnosis not present

## 2023-11-03 DIAGNOSIS — R059 Cough, unspecified: Secondary | ICD-10-CM | POA: Diagnosis not present

## 2023-11-03 DIAGNOSIS — E785 Hyperlipidemia, unspecified: Secondary | ICD-10-CM | POA: Diagnosis not present

## 2023-11-03 DIAGNOSIS — I081 Rheumatic disorders of both mitral and tricuspid valves: Secondary | ICD-10-CM | POA: Diagnosis not present

## 2023-11-03 DIAGNOSIS — R0602 Shortness of breath: Secondary | ICD-10-CM | POA: Diagnosis not present

## 2023-11-03 DIAGNOSIS — E876 Hypokalemia: Secondary | ICD-10-CM | POA: Diagnosis not present

## 2023-11-04 DIAGNOSIS — E119 Type 2 diabetes mellitus without complications: Secondary | ICD-10-CM | POA: Diagnosis not present

## 2023-11-04 DIAGNOSIS — R791 Abnormal coagulation profile: Secondary | ICD-10-CM | POA: Diagnosis not present

## 2023-11-04 DIAGNOSIS — I447 Left bundle-branch block, unspecified: Secondary | ICD-10-CM | POA: Diagnosis not present

## 2023-11-04 DIAGNOSIS — R0600 Dyspnea, unspecified: Secondary | ICD-10-CM | POA: Diagnosis not present

## 2023-11-04 DIAGNOSIS — R000 Tachycardia, unspecified: Secondary | ICD-10-CM | POA: Diagnosis not present

## 2023-11-04 DIAGNOSIS — E876 Hypokalemia: Secondary | ICD-10-CM | POA: Diagnosis not present

## 2023-11-04 DIAGNOSIS — I080 Rheumatic disorders of both mitral and aortic valves: Secondary | ICD-10-CM | POA: Diagnosis not present

## 2023-11-05 DIAGNOSIS — I447 Left bundle-branch block, unspecified: Secondary | ICD-10-CM | POA: Diagnosis not present

## 2023-11-05 DIAGNOSIS — I4892 Unspecified atrial flutter: Secondary | ICD-10-CM | POA: Diagnosis not present

## 2023-11-05 DIAGNOSIS — I4891 Unspecified atrial fibrillation: Secondary | ICD-10-CM | POA: Diagnosis not present

## 2023-11-07 ENCOUNTER — Encounter: Payer: Medicare HMO | Admitting: Rehabilitative and Restorative Service Providers"

## 2023-11-08 ENCOUNTER — Telehealth: Payer: Self-pay | Admitting: Family Medicine

## 2023-11-08 NOTE — Telephone Encounter (Signed)
Copied from CRM [id number]. Topic: General - Other >> Nov 08, 2023 11:24 AM Antony Haste wrote: Reason for CRM: DME provider - Rada Hay was following up with paperwork that was faxed on 12/24 for PT's glucose monitor, Rada Hay states the paperwork includes specific information and would like to know if its been received yet, and when it will be faxed back to the office.

## 2023-11-14 ENCOUNTER — Ambulatory Visit: Payer: Medicare HMO | Admitting: Physical Therapy

## 2023-11-18 ENCOUNTER — Other Ambulatory Visit: Payer: Self-pay | Admitting: *Deleted

## 2023-11-18 ENCOUNTER — Other Ambulatory Visit: Payer: Self-pay

## 2023-11-18 DIAGNOSIS — I447 Left bundle-branch block, unspecified: Secondary | ICD-10-CM

## 2023-11-18 NOTE — Progress Notes (Signed)
   11/18/2023  Patient ID: Jillian Huynh, female   DOB: 12/09/1945, 78 y.o.   MRN: [id number]  S/O Telephone visit to follow-up on management of diabetes and blood pressure  Diabetes -Current medications:  Ozempic  1mg  weekly, Farxiga  10mg  daily  -Patient states 2025 PAP re-enrollment applications for Ozempic  through [employer] and Farxiga  through [place] have been approved -A1c in November improved from previous- down to 7.3% -Checks home BG daily and states FBG averaging 125-135.  Using [place] 2 for CGM. -Does not endorse any s/sx of hypoglycemia -Patient states current weight of 157 and she has had improvement in appetite recently -Patient continues with improvement in constipation; states she recently did a colon cleanse after seeing Dr. ONEIDA and has been regular since.  She continues to docusate 100mg  BID and Miralax as needed   Hypotension -Current medications:  metoprolol  xl 12.5mg  daily, Entresto  24/26mg  BID -Patient enrolled in [employer] (expires in March) to assist with copay of Entresto  -Dose of Farxiga  had to be decreased at one point to help with patient's hypotension -She does check home BP regularly and states this remains consistently >100/70 -Patient does state she recently experienced rapid heart beat on way to son's in [place] for holidays.  She went to urgent care and ended up in the hospital for 2 days.  The hospital informed her that Mg and K levels were low.  She is currently awaiting an appointment with Dr. Pietro with cardiology.  A/P  Diabetes -Continue current regimen and regular monitoring of BG at this time  Hypotension -Continue current regimen and regular monitoring at this time -Informing Dr. Alvan of recent tachycardia and hospital stay  Follow-up:  3 months  Channing DELENA Mealing, PharmD, DPLA

## 2023-11-19 NOTE — Telephone Encounter (Signed)
 Order with OV notes sent to DME

## 2023-11-19 NOTE — Telephone Encounter (Signed)
 Task completed

## 2023-11-20 ENCOUNTER — Ambulatory Visit: Payer: Medicare HMO | Attending: Sports Medicine

## 2023-11-20 DIAGNOSIS — R2689 Other abnormalities of gait and mobility: Secondary | ICD-10-CM | POA: Insufficient documentation

## 2023-11-20 DIAGNOSIS — M5416 Radiculopathy, lumbar region: Secondary | ICD-10-CM | POA: Diagnosis not present

## 2023-11-20 DIAGNOSIS — M6281 Muscle weakness (generalized): Secondary | ICD-10-CM | POA: Diagnosis present

## 2023-11-20 NOTE — Therapy (Signed)
 OUTPATIENT PHYSICAL THERAPY THORACOLUMBAR TREATMENT   Patient Name: Jillian Huynh MRN: [id number] DOB:1946-08-09, 78 y.o., female Today's Date: 11/20/2023  END OF SESSION:  PT End of Session - 11/20/23 1100     Visit Number 3    Date for PT Re-Evaluation 12/24/23    Authorization Type [employer] MCR    Progress Note Due on Visit 10    PT Start Time 1100    PT Stop Time 1145    PT Time Calculation (min) 45 min    Activity Tolerance Patient tolerated treatment well    Behavior During Therapy WFL for tasks assessed/performed             Past Medical History:  Diagnosis Date   Diabetes mellitus without complication (HCC)    Hyperlipidemia    Hypertension    Past Surgical History:  Procedure Laterality Date   CHOLECYSTECTOMY     Patient Active Problem List   Diagnosis Date Noted   Lumbar spondylosis 10/25/2023   Chronic idiopathic constipation 10/25/2023   Movement disorder 09/30/2023   Hypertensive heart disease with heart failure (HCC) 08/07/2023   Type 2 diabetes mellitus with circulatory disorder (HCC) 08/07/2023   Depression, major, single episode, moderate (HCC) 07/10/2023   Hearing aid worn 11/28/2022   Systolic CHF (HCC) 05/21/2022   Cardiomyopathy (HCC) 05/21/2022   Left bundle branch block (LBBB) determined by electrocardiography 08/08/2021   Gastroesophageal reflux disease without esophagitis 09/19/2020   BMI 31.0-31.9,adult 05/25/2019   Fatty liver 12/25/2017   BP (high blood pressure) 03/28/2015   Dyslipidemia 03/28/2015   Insomnia 03/28/2015   Restless leg 03/28/2015   Mass of arm 03/14/2015   Diabetes mellitus type 2, controlled (HCC) 03/14/2015   Obstructive apnea 09/03/2012   HYPERKERATOSIS 04/01/2009    PCP: Alvan Dorothyann BIRCH, MD   REFERRING PROVIDER: Curtis Debby PARAS,*   REFERRING DIAG: [id number] (ICD-10-CM) - Lumbar spondylosis   Rationale for Evaluation and Treatment: Rehabilitation  THERAPY DIAG:  Radiculopathy, lumbar  region  Muscle weakness (generalized)  Other abnormalities of gait and mobility  ONSET DATE: one year  SUBJECTIVE:                                                                                                                                                                                           SUBJECTIVE STATEMENT: Patient reports prednisone  took away R leg pain. Patient states her husband has to help her get dressed with pants. Patient states her balance feels good today. Patient states she does not need the walker for her house but can see the benefit of using it out and about in the community environment.  PERTINENT HISTORY:  DM, HTN, lumbar spondylosis, restless leg syndrome  PAIN:  Are you having pain? Yes: NPRS scale: 0 now up to/10 Pain location: R hip to foot Pain description: sharp Aggravating factors: weight bearing Relieving factors: sitting  PRECAUTIONS: None  RED FLAGS: None   WEIGHT BEARING RESTRICTIONS: No  FALLS:  Has patient fallen in last 6 months? Yes. Number of falls 1 about 4 months ago; stepped in dog feces and slipped.  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  NEXT MD VISIT: 6 weeks  OBJECTIVE:  Note: Objective measures were completed at Evaluation unless otherwise noted.  DIAGNOSTIC FINDINGS:  Lumbar DG FINDINGS: Five lumbar-type vertebral bodies. Preserved vertebral body heights. No listhesis. Scattered diffuse moderate endplate osteophytes which are near bridging. Osteopenia. Prominent facet degenerative changes. No spondylolysis. Bridging osteophytes are seen along the lower thoracic spine at the edge of the imaging field  PATIENT SURVEYS:  FOTO deferred due to no back  pain reported  COGNITION: Overall cognitive status: Within functional limits for tasks assessed     SENSATION: Intermitent tingling in B feet usually starts around 4 pm  MUSCLE LENGTH:   POSTURE: rounded shoulders, forward head, and flexed trunk   PALPATION: B gluteals tender and tight  LUMBAR ROM:   AROM eval  Flexion WFL  Extension WFL  Right lateral flexion WFL  Left lateral flexion Limited 50% compared to R  Right rotation Limited 50% compared to L  Left rotation WFL   (Blank rows = not tested)  LOWER EXTREMITY ROM:   WFL for tasks assessed  LOWER EXTREMITY MMT:  grossly 4-4+/5 in B LE  LUMBAR SPECIAL TESTS:  Straight leg raise test: Positive and Slump test: Positive for nerve tension R   FUNCTIONAL TESTS:  5 times sit to stand: 24.71 sec  Timed up and go (TUG): 13.84 sec Dynamic Gait Index: 14/24  10/31/23 BERG  GAIT: Distance walked: 40 Assistive device utilized: None Level of assistance: Complete Independence Comments: decreased heel strike B, short step and stride length, forward trunk lean and decreased arm swing.  TODAY'S TREATMENT:                                                                                                                              [facility] Adult PT Treatment:                                                DATE: 11/20/2023 Therapeutic Exercise: Updating STGs STS + big arm overhead reach x10 STS on corner of table Standing heel & toe raises  LAQ + 3#AW 2x10 (B) Seated marching 3x10 + 3#AW Therapeutic Activity: Walking + head turns & nods --> no AD, FWW Gait training with FWW Walking (no AD) --> added intervals with colored dots for stride length Walking with metronome Marching + 3#AW --> added head turns + counting to 3     DATE:   10/31/23 DGI completed - 14/24 BERG - 46/56 Bridge x 20 SLR with ab set x 10 B S/L hip ABD and clam x 10 ea B Seated LAQ for nerve glide x 10 B Sit to stand x 10  cues to control  descent  Gait: worked with the cane briefly, but pt was not comfortable with holding it in her L hand. Then worked with RW and gait significantly improved with longer strep and stride lengths.    PATIENT EDUCATION:  Education details: PT eval findings, anticipated POC, initial HEP, postural awareness, tips to reduce freezing with transfers and gait, and worked on log rolling for bed mobility  Person educated: Patient Education method: Programmer, Multimedia, Demonstration, and Handouts Education comprehension: verbalized understanding and returned demonstration  HOME EXERCISE PROGRAM: Access Code: [email or web address] URL: [email or web address] Date: 10/29/2023 Prepared by: Mliss  Exercises - Supine Bridge  - 1 x daily - 7 x weekly - 2 sets - 10 reps - Seated Long Arc Quad  - 1 x daily - 7 x weekly - 2 sets - 10 reps - Sit to Stand  - 3 x daily - 7 x weekly - 1-2 sets - 10 reps  Patient Education - Tips to reduce freezing episodes with standing or walking   ASSESSMENT:  CLINICAL IMPRESSION: Session focused on gait training with and without AD and functional LE strengthening. Colored dots utilized during investment banker, operational without AD; visual cues improved stepping response and stride length. Ankle weights added to progress safe foot clearance and awareness when walking.    OBJECTIVE IMPAIRMENTS: Abnormal gait, decreased balance, decreased mobility, decreased ROM, decreased strength, increased muscle spasms, impaired flexibility, postural dysfunction, and pain.   ACTIVITY LIMITATIONS: transfers, bed mobility, and locomotion level  PARTICIPATION LIMITATIONS: community activity and occupation  PERSONAL FACTORS: Age, Time since onset of injury/illness/exacerbation, and 1-2 comorbidities: DM, Parkinsonian symptoms  are also affecting patient's functional outcome.   REHAB POTENTIAL: Good  CLINICAL DECISION MAKING: Evolving/moderate complexity  EVALUATION COMPLEXITY:  Moderate   GOALS: Goals reviewed with patient? Yes  SHORT TERM GOALS: Target date: 11/26/2023   Patient will be independent with initial HEP. Baseline:  Goal status: INITIAL  2. Patient will report centralization of radicular symptoms.  Baseline:  Goal status: MET  3. Patient will be independent with supine to sit transfers. Baseline:  Goal status: MET   LONG TERM GOALS: Target date: 12/24/2023  Patient will be independent with advanced/ongoing HEP to improve outcomes and carryover.  Baseline:  Goal status: INITIAL  2.  Patient will be able to ambulate 600' in 6 min with LRAD with good safety to access community.  Baseline:  Goal status: INITIAL  3.  Patient to have no recurrence of R radicular pain.  Baseline:  Goal status: MET   4.  Patient will demonstrate decreased 5XSTS to less than 16 seconds showing improved functional strength and decreasing risk for falls. Baseline: 24.7 sec Goal status: INITIAL  5.  Patient will demonstrate at least 19/24 on DGI to improve gait stability and reduce risk for falls. Baseline: TBD Goal status: INITIAL  6. Patient will demonstrate full/functional pain free lumbar ROM to perform ADLs.  Baseline:  Goal status: INITIAL   PLAN:  PT FREQUENCY: 2x/week  PT DURATION: 8 weeks  PLANNED INTERVENTIONS: 97164- PT Re-evaluation, 97110-Therapeutic exercises, 97530- Therapeutic activity, [id number]- Neuromuscular re-education, 97535- Self Care, [id number]- Manual therapy, [id number]- Gait training, [phone]- Aquatic Therapy, 97014- Electrical stimulation (unattended), [phone]- Traction (mechanical), Patient/Family education, Balance training, Stair training, Taping, Dry Needling, Joint mobilization, Spinal mobilization, Cryotherapy, and Moist heat.  PLAN FOR NEXT SESSION: Continue gait training with AD, lumbar ROM, LE strength, supine to sit transfers, postural strengthening  Lamarr Price, PTA 11/20/2023, 11:53 AM

## 2023-11-21 ENCOUNTER — Ambulatory Visit: Payer: Medicare HMO | Admitting: Nurse Practitioner

## 2023-11-22 ENCOUNTER — Ambulatory Visit: Payer: Medicare HMO | Admitting: Physical Therapy

## 2023-11-27 ENCOUNTER — Other Ambulatory Visit (HOSPITAL_BASED_OUTPATIENT_CLINIC_OR_DEPARTMENT_OTHER): Payer: Self-pay | Admitting: Family Medicine

## 2023-11-27 DIAGNOSIS — M545 Low back pain, unspecified: Secondary | ICD-10-CM

## 2023-11-28 ENCOUNTER — Ambulatory Visit (INDEPENDENT_AMBULATORY_CARE_PROVIDER_SITE_OTHER): Payer: Medicare HMO

## 2023-11-28 ENCOUNTER — Encounter: Payer: Self-pay | Admitting: Cardiovascular Disease

## 2023-11-28 ENCOUNTER — Ambulatory Visit: Payer: Medicare HMO | Attending: Cardiovascular Disease | Admitting: Cardiovascular Disease

## 2023-11-28 ENCOUNTER — Other Ambulatory Visit: Payer: Self-pay | Admitting: Cardiovascular Disease

## 2023-11-28 VITALS — BP 120/70 | HR 74 | Ht 62.0 in | Wt 158.8 lb

## 2023-11-28 DIAGNOSIS — I429 Cardiomyopathy, unspecified: Secondary | ICD-10-CM | POA: Diagnosis not present

## 2023-11-28 DIAGNOSIS — I447 Left bundle-branch block, unspecified: Secondary | ICD-10-CM | POA: Diagnosis not present

## 2023-11-28 DIAGNOSIS — I5022 Chronic systolic (congestive) heart failure: Secondary | ICD-10-CM

## 2023-11-28 MED ORDER — APIXABAN 5 MG PO TABS: 5.0000 mg | ORAL_TABLET | Freq: Two times a day (BID) | ORAL | 11 refills | Status: DC

## 2023-11-28 MED ORDER — APIXABAN 5 MG PO TABS: 5.0000 mg | ORAL_TABLET | Freq: Two times a day (BID) | ORAL | Status: DC

## 2023-11-28 NOTE — Progress Notes (Unsigned)
ZIO XT serial # [id number] T from office inventory given to patient. Dr. Nelly Laurence to read.

## 2023-11-28 NOTE — Progress Notes (Signed)
Electrophysiology Office Note:    Date:  11/28/2023   ID:  Jillian Huynh, DOB 1946/02/11, MRN [id number]  PCP:  Agapito Games, MD   [facility] Providers Cardiologist:  None Sleep Medicine:  Armanda Magic, MD     Referring MD: Lewayne Bunting, MD   History of Present Illness:    Jillian Huynh is a 78 y.o. female with a medical history significant for bundle-branch block, CAD (elevated CAC), recovered EF, diabetes referred for evaluation of arrhythmia.     She presented to [place] ER with tachycardia with wide-complex QRS.  This occurred while traveling from [place] to [place] on 12/22 when she began to experience chest pressure and rapid heart rates.  She converted to sinus rhythm after receiving IV amiodarone.  Otherwise, she has never had palpitations, chest pressure symptoms.     Today, she reports that she is fine, at baseline.  No palpitations, no presyncope  EKGs/Labs/Other Studies Reviewed Today:     Echocardiogram:  TTE November 2023 EF 50 to 55%.  Moderate LVH.  Grade 1 diastolic dysfunction.  Mild aortic regurgitation, trivial mitral regurgitation.  Cannot exclude small PFO.  Normal biatrial sizes.  TTE November 2022 LVEF 45 to 50%   Advanced imaging:  Cardiac MRI August 2023 Normal LV size with mild focal basal septal hypertrophy.  No evidence for LVOT gradient or mitral valve SAM.  LVEF 49%.  Mild diffuse hypokinesis with septal lateral dyssynchrony consistent with a left bundle branch block.  Possible subtle mid wall LGE at the lateral wall base.   EKG:   EKG Interpretation Date/Time:  Thursday November 28 2023 10:20:38 EST Ventricular Rate:  74 PR Interval:  184 QRS Duration:  140 QT Interval:  428 QTC Calculation: 475 R Axis:   38  Text Interpretation: Normal sinus rhythm Left bundle branch block When compared with ECG of 02-Mar-2023 20:31, No significant change was found Confirmed by York Pellant [phone]) on  11/28/2023 10:59:32 AM     Physical Exam:    VS:  BP 120/70 (BP Location: Left Arm, Patient Position: Sitting, Cuff Size: Normal)   Pulse 74   Ht 5\' 2"  (1.575 m)   Wt 158 lb 12.8 oz (72 kg)   SpO2 95%   BMI 29.04 kg/m     Wt Readings from Last 3 Encounters:  11/28/23 158 lb 12.8 oz (72 kg)  10/08/23 166 lb 14.4 oz (75.7 kg)  09/30/23 166 lb (75.3 kg)     GEN: Well nourished, well developed in no acute distress CARDIAC: RRR, no murmurs, rubs, gallops RESPIRATORY:  Normal work of breathing MUSCULOSKELETAL: no edema    ASSESSMENT & PLAN:     Wide-complex tachycardia Similar morphology and axis to underlying left bundle branch block I do not see clear evidence of A-V dissociation Wellens criteria for VT not met Suspect SVT -- possibly flutter based upon rate - with underlying LBBB We discussed management options including EP study to define and possibly correct the arrhythmia. Will place 2-week monitor to evaluate for concomitant asymptomatic AF She was very symptomatic with this episode,  She will continue metoprolol 25 mg twice daily Continue eliquis 5mg  PO BID for suspected flutter. If she does not have AF on monitor or recurrence of AF, may consider DC of eliquis on follow-up Labs 12/22 reviewed  Nonischemic cardiomyopathy History of NICM, EF recovered  Coronary artery disease Detected on CTA No obstructive disease  Obstructive sleep apnea She did not tolerate CPAP  Signed, Maurice Small, MD  11/28/2023 11:12 AM    [facility]

## 2023-11-28 NOTE — Patient Instructions (Signed)
Medication Instructions:  Continue Eliquis 5 mg twice daily - sent in refills for your to [facility] *If you need a refill on your cardiac medications before your next appointment, please call your pharmacy*   Testing/Procedures: Zio Cardiac Event Monitor - wear for 2 weeks Your physician has recommended that you wear an event monitor. Event monitors are medical devices that record the heart's electrical activity. Doctors most often [place] these monitors to diagnose arrhythmias. Arrhythmias are problems with the speed or rhythm of the heartbeat. The monitor is a small, portable device. You can wear one while you do your normal daily activities. This is usually used to diagnose what is causing palpitations/syncope (passing out).    Follow-Up: At [facility], you and your health needs are our priority.  As part of our continuing mission to provide you with exceptional heart care, we have created designated Provider Care Teams.  These Care Teams include your primary Cardiologist (physician) and Advanced Practice Providers (APPs -  Physician Assistants and Nurse Practitioners) who all work together to provide you with the care you need, when you need it.  We recommend signing up for the patient portal called "MyChart".  Sign up information is provided on this After Visit Summary.  MyChart is used to connect with patients for Virtual Visits (Telemedicine).  Patients are able to view lab/test results, encounter notes, upcoming appointments, etc.  Non-urgent messages can be sent to your provider as well.   To learn more about what you can do with MyChart, go to [email or web address].    Your next appointment:   6 month(s)  Provider:   You will see one of the following Advanced Practice Providers on your designated Care Team:   Francis Dowse, Charlott Holler [address] [place], [place] Sherie Don, NP Canary Brim, NP

## 2023-12-06 ENCOUNTER — Ambulatory Visit: Payer: Medicare HMO | Admitting: Sports Medicine

## 2023-12-06 ENCOUNTER — Ambulatory Visit (INDEPENDENT_AMBULATORY_CARE_PROVIDER_SITE_OTHER): Payer: Medicare HMO | Admitting: Sports Medicine

## 2023-12-06 DIAGNOSIS — F321 Major depressive disorder, single episode, moderate: Secondary | ICD-10-CM | POA: Diagnosis not present

## 2023-12-06 DIAGNOSIS — K5904 Chronic idiopathic constipation: Secondary | ICD-10-CM

## 2023-12-06 DIAGNOSIS — M47816 Spondylosis without myelopathy or radiculopathy, lumbar region: Secondary | ICD-10-CM | POA: Diagnosis not present

## 2023-12-06 NOTE — Assessment & Plan Note (Signed)
Chronic idiopathic constipation, she had tried Colace and MiraLAX without improvement, she was profoundly constipated and extremely uncomfortable, she also has a movement disorder, she has masklike facies that did raise my concern for Parkinson's disease. We were concerned about Shy-Drager syndrome which is the autonomic dysfunction component of Parkinson's disease, we treated her aggressively with the colonoscopy prep regimen, she had fantastic bowel movements and continues to stool regularly. She can follow-up as needed for this, and I would recommend keeping a close eye on additional presentations of movement disorders.

## 2023-12-06 NOTE — Assessment & Plan Note (Signed)
This very pleasant 78 year old female returns, she was having chronic axial low back pain, right-sided with radiation down both legs, right worse than left typically in an L5 distribution and worse with standing. X-rays were done with an outside provider that showed multilevel spondylitic changes, she was prescribed muscle relaxers but did not improve, exam was benign. At the last visit she was on the verge of tears in the exam room, we discussed the complex interplay between mood and musculoskeletal pain, we talked about the importance of control of both physical and psychiatric pain, and that we would not be able to control her physical pain without control of her psychiatric pain. We did treat her with 5 days of steroids and formal PT but I also added Cymbalta as above. We held off on gabapentin. She returns today 6 weeks later having done fantastic, she has no back pain, her mood has improved considerably, she is happy with how things are going so we will continue Cymbalta, she we will continue her home PT regimen and she does not need to see me back.

## 2023-12-06 NOTE — Progress Notes (Signed)
    Procedures performed today:    None.  Independent interpretation of notes and tests performed by another provider:   None.  Brief History, Exam, Impression, and Recommendations:    Depression, major, single episode, moderate (HCC) Jillian Huynh returns, we had initially treated her for depression related to her medical illness, she was on Lexapro but it was no longer on her list, as we suspected that her depression was creating complex interplay with her back pain we added Cymbalta, she is on 30 mg and is doing fantastic, she tells me her back pain is gone and her mood is significantly better, no change in dosage needed, we can hand her back over to her PCP.  Lumbar spondylosis This very pleasant 78 year old female returns, she was having chronic axial low back pain, right-sided with radiation down both legs, right worse than left typically in an L5 distribution and worse with standing. X-rays were done with an outside provider that showed multilevel spondylitic changes, she was prescribed muscle relaxers but did not improve, exam was benign. At the last visit she was on the verge of tears in the exam room, we discussed the complex interplay between mood and musculoskeletal pain, we talked about the importance of control of both physical and psychiatric pain, and that we would not be able to control her physical pain without control of her psychiatric pain. We did treat her with 5 days of steroids and formal PT but I also added Cymbalta as above. We held off on gabapentin. She returns today 6 weeks later having done fantastic, she has no back pain, her mood has improved considerably, she is happy with how things are going so we will continue Cymbalta, she we will continue her home PT regimen and she does not need to see me back.  Chronic idiopathic constipation Chronic idiopathic constipation, she had tried Colace and MiraLAX without improvement, she was profoundly constipated and extremely  uncomfortable, she also has a movement disorder, she has masklike facies that did raise my concern for Parkinson's disease. We were concerned about Shy-Drager syndrome which is the autonomic dysfunction component of Parkinson's disease, we treated her aggressively with the colonoscopy prep regimen, she had fantastic bowel movements and continues to stool regularly. She can follow-up as needed for this, and I would recommend keeping a close eye on additional presentations of movement disorders.    ____________________________________________ Ihor Austin. Jillian Huynh, M.D., ABFM., CAQSM., AME. Primary Care and Sports Medicine [facility]  Adjunct Professor of Family Medicine  [employer]  Restaurant manager, fast food

## 2023-12-06 NOTE — Assessment & Plan Note (Signed)
Jillian Huynh returns, we had initially treated her for depression related to her medical illness, she was on Lexapro but it was no longer on her list, as we suspected that her depression was creating complex interplay with her back pain we added Cymbalta, she is on 30 mg and is doing fantastic, she tells me her back pain is gone and her mood is significantly better, no change in dosage needed, we can hand her back over to her PCP.

## 2023-12-09 ENCOUNTER — Telehealth: Payer: Self-pay

## 2023-12-09 NOTE — Telephone Encounter (Signed)
PAP: Patient assistance application for Ozempic has been approved by PAP Companies: [employer] from 11/13/2023 to 11/11/2024. Medication should be delivered to PAP Delivery: Provider's office. For further shipping updates, please [employer] at [phone]. Patient ID is: per automated system

## 2023-12-09 NOTE — Progress Notes (Signed)
Pharmacy Medication Assistance Program Note    12/09/2023  Patient ID: Jillian Huynh, female   DOB: 1946/05/27, 78 y.o.   MRN: [id number]     12/09/2023  Outreach Medication One  Initial Outreach Date (Medication One) 12/09/2023  Manufacturer Medication One [employer] [facility] Ozempic  Dose of Ozempic 1mg /week  Type of Radiographer, therapeutic Assistance  Date Application Sent to Patient 12/10/2023  Application Items Requested Application;Proof of Income  Date Application Sent to Prescriber 12/09/2023  Name of Prescriber Nani Gasser  Patient Assistance Determination Approved  Approval Start Date 11/13/2023  Approval End Date 11/11/2024     Signature

## 2023-12-09 NOTE — Telephone Encounter (Signed)
PAP: Patient assistance application for Ozempic through [employer] has been mailed to pt's home address on file. Provider portion of application will be faxed to provider's office.  Please note I have faxed the provider portion of the application to Dr. Nani Gasser

## 2023-12-16 ENCOUNTER — Encounter: Payer: Self-pay | Admitting: Rehabilitative and Restorative Service Providers"

## 2023-12-16 ENCOUNTER — Ambulatory Visit: Payer: Medicare HMO | Attending: Sports Medicine | Admitting: Rehabilitative and Restorative Service Providers"

## 2023-12-16 DIAGNOSIS — R2689 Other abnormalities of gait and mobility: Secondary | ICD-10-CM | POA: Diagnosis present

## 2023-12-16 DIAGNOSIS — M6281 Muscle weakness (generalized): Secondary | ICD-10-CM | POA: Diagnosis present

## 2023-12-16 DIAGNOSIS — M5416 Radiculopathy, lumbar region: Secondary | ICD-10-CM | POA: Insufficient documentation

## 2023-12-16 NOTE — Therapy (Signed)
OUTPATIENT PHYSICAL THERAPY THORACOLUMBAR TREATMENT   Patient Name: Jillian Huynh MRN: [id number] DOB:05-20-1946, 78 y.o., female Today's Date: 12/16/2023  END OF SESSION:  PT End of Session - 12/16/23 1316     Visit Number 4    Number of Visits 16    Date for PT Re-Evaluation 12/24/23    Authorization Type [employer] MCR    Progress Note Due on Visit 10    PT Start Time 1316    PT Stop Time 1358    PT Time Calculation (min) 42 min    Activity Tolerance Patient tolerated treatment well    Behavior During Therapy WFL for tasks assessed/performed             Past Medical History:  Diagnosis Date   Diabetes mellitus without complication (HCC)    Hyperlipidemia    Hypertension    Past Surgical History:  Procedure Laterality Date   CHOLECYSTECTOMY     Patient Active Problem List   Diagnosis Date Noted   Lumbar spondylosis 10/25/2023   Chronic idiopathic constipation 10/25/2023   Movement disorder 09/30/2023   Hypertensive heart disease with heart failure (HCC) 08/07/2023   Type 2 diabetes mellitus with circulatory disorder (HCC) 08/07/2023   Depression, major, single episode, moderate (HCC) 07/10/2023   Hearing aid worn 11/28/2022   Systolic CHF (HCC) 05/21/2022   Cardiomyopathy (HCC) 05/21/2022   Left bundle branch block (LBBB) determined by electrocardiography 08/08/2021   Gastroesophageal reflux disease without esophagitis 09/19/2020   BMI 31.0-31.9,adult 05/25/2019   Fatty liver 12/25/2017   BP (high blood pressure) 03/28/2015   Dyslipidemia 03/28/2015   Insomnia 03/28/2015   Restless leg 03/28/2015   Mass of arm 03/14/2015   Diabetes mellitus type 2, controlled (HCC) 03/14/2015   Obstructive apnea 09/03/2012   HYPERKERATOSIS 04/01/2009    PCP: Agapito Games, MD REFERRING PROVIDER: Monica Becton,*   REFERRING DIAG: [id number] (ICD-10-CM) - Lumbar spondylosis   Rationale for Evaluation and Treatment: Rehabilitation  THERAPY DIAG:   Radiculopathy, lumbar region  Muscle weakness (generalized)  Other abnormalities of gait and mobility  ONSET DATE: one year  SUBJECTIVE:                                                                                                                                                                                           SUBJECTIVE STATEMENT: The patient reports that she has had slow days since last time, but is moving faster today. She feels like she is getting tremors and is concerned about Parkinson's Disease-- she has an appointment with neurologist upcoming. She notes fluctuating condition-- some days she can't do her exercises well.  She also has some bowel issues (constipation).   PERTINENT HISTORY:  DM, HTN, lumbar spondylosis, restless leg syndrome  PAIN:  Are you having pain? Yes: NPRS scale: 0 now up to/10 Pain location: R hip to foot Pain description: sharp Aggravating factors: weight bearing Relieving factors: sitting  PRECAUTIONS: None  WEIGHT BEARING RESTRICTIONS: No  FALLS:  Has patient fallen in last 6 months? Yes. Number of falls 1 about 4 months ago; stepped in dog feces and slipped.  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  NEXT MD VISIT: 6 weeks  OBJECTIVE:  Note: Objective measures were completed at Evaluation unless otherwise noted. DIAGNOSTIC FINDINGS:  Lumbar DG FINDINGS: Five lumbar-type vertebral bodies. Preserved vertebral body heights. No listhesis. Scattered diffuse moderate endplate osteophytes which are near bridging. Osteopenia. Prominent facet degenerative changes. No spondylolysis. Bridging osteophytes are seen along the lower thoracic spine at the edge of the imaging  field  SENSATION: Intermitent tingling in B feet usually starts around 4 pm  POSTURE: rounded shoulders, forward head, and flexed trunk   PALPATION: B gluteals tender and tight  LUMBAR ROM:  AROM eval  Flexion WFL  Extension WFL  Right lateral flexion WFL  Left lateral flexion Limited 50% compared to R  Right rotation Limited 50% compared to L  Left rotation WFL   (Blank rows = not tested)  LOWER EXTREMITY ROM:   WFL for tasks assessed  LOWER EXTREMITY MMT:  grossly 4-4+/5 in B LE  LUMBAR SPECIAL TESTS:  Straight leg raise test: Positive and Slump test: Positive for nerve tension R  FUNCTIONAL TESTS:  5 times sit to stand: 24.71 sec  Timed up and go (TUG): 13.84 sec Dynamic Gait Index: 14/24  10/31/23 BERG = 46/56 GAIT: Distance walked: 40 Assistive device utilized: None Level of assistance: Complete Independence Comments: decreased heel strike B, short step and stride length, forward trunk lean and decreased arm swing.  [facility] Adult PT Treatment:                                                DATE: 12/16/23 Therapeutic Exercise: Supine Bridges x 8 reps Postural stretching with arms overhead and arms to a T Neuromuscular re-ed: Wall bumps working on weight shifting and toes lifting with posterior weight shifting Standing PWR! Exercises  PWR up x 10 reps (sit<>stand) PWR twist x 6 reps PWR step x 6 reps alternating PWR rock x 5 reps using door frame for cues to open into big motion Prone PWR! Exercises PWR up prone working on postural lengthening PWR twist with cues to reach x 3 reps to each side PWR rock x 5 reps Therapeutic Activity: Worked on sit<>supine and breaking it into components Gait: Emphasizing bilateral heel strike and arm swing during gait x 350 feet  [facility] Adult PT Treatment:                                                DATE:  11/20/2023 Therapeutic Exercise: Updating STGs STS + big arm overhead reach x10 STS on corner of table Standing heel & toe raises LAQ + 3#AW 2x10 (B) Seated marching 3x10 + 3#AW Therapeutic Activity: Walking + head turns & nods --> no AD, FWW Gait training with FWW Walking (no AD) --> added intervals with colored dots for stride length Walking with metronome Marching + 3#AW --> added head turns + counting to 3    PATIENT EDUCATION:  Education details: HEP Person educated: Patient Education method: Programmer, multimedia, Demonstration, and Handouts Education comprehension: verbalized understanding and returned demonstration  HOME EXERCISE PROGRAM: Access Code: [email or web address] URL: [email or web address] Date: 12/16/2023 Prepared by: Margretta Ditty  Exercises - Supine Bridge  - 1 x daily - 7 x weekly - 2 sets - 10 reps - Seated Long Arc Quad  - 1 x daily - 7 x weekly - 2 sets - 10 reps - Sit to Stand  - 3 x daily - 7 x weekly - 1-2 sets - 10 reps - Standing Heel Raises  - 1 x daily - 7 x weekly - 3 sets - 10 reps - Standing Ankle Dorsiflexion with Table Support  - 1 x daily - 7 x weekly - 3 sets - 10 reps - Standing Marching  - 1 x daily - 7 x weekly - 3 sets - 10 reps - Seated Knee Extension with Resistance  - 1 x daily - 7 x weekly - 3 sets - 10 reps  Patient Education - Tips to reduce freezing episodes with standing or walking   ASSESSMENT:  CLINICAL IMPRESSION: The patient tolerated PWR! Introduction today with standing and prone body positions. We also worked on gait activities with longer stride and arm swing. The patient has some characteristics consistent with Parkinsonian presentation-- she has neurology visit upcoming.  PT worked on bed mobility using PWR twist to initiate movement and counting to work on [employer] with movement. PT to continue to progress to patient tolerance.    GOALS: Goals reviewed with patient? Yes  SHORT TERM GOALS: Target date: 11/26/2023    Patient will be independent with initial HEP. Baseline:  Goal status: *Needs further cues  2. Patient will report centralization of radicular symptoms.  Baseline:  Goal status: MET  3. Patient will be independent with supine to sit transfers. Baseline:  Goal status: MET   LONG TERM GOALS: Target date: 12/24/2023  Patient will be independent with advanced/ongoing HEP to improve outcomes and carryover.  Baseline:  Goal status: INITIAL  2.  Patient will be able to ambulate 600' in 6 min with LRAD with good safety to access community.  Baseline:  Goal status: INITIAL  3.  Patient to have no recurrence of R radicular pain.  Baseline:  Goal status: MET   4.  Patient will demonstrate decreased 5XSTS to less than 16 seconds showing improved functional strength and decreasing risk for falls. Baseline: 24.7 sec Goal status: INITIAL  5.  Patient will demonstrate at least 19/24 on DGI to improve gait stability and reduce risk for falls. Baseline: TBD Goal status: INITIAL  6. Patient will demonstrate full/functional pain free lumbar ROM to perform ADLs.   Baseline:  Goal status:  INITIAL   PLAN:  PT FREQUENCY: 2x/week  PT DURATION: 8 weeks  PLANNED INTERVENTIONS: 97164- PT Re-evaluation, 97110-Therapeutic exercises, 97530- Therapeutic activity, [id number]- Neuromuscular re-education, 97535- Self Care, 16109- Manual therapy, [id number]- Gait training, [phone]- Aquatic Therapy, 97014- Electrical stimulation (unattended), [phone]- Traction (mechanical), Patient/Family education, Balance training, Stair training, Taping, Dry Needling, Joint mobilization, Spinal mobilization, Cryotherapy, and Moist heat.  PLAN FOR NEXT SESSION: Continue gait training with AD, lumbar ROM, LE strength, supine to sit transfers, postural strengthening, PWR exercise progression  Bradford Cazier, PT 12/16/2023, 1:16 PM

## 2023-12-19 ENCOUNTER — Ambulatory Visit: Payer: Medicare HMO

## 2023-12-19 DIAGNOSIS — M5416 Radiculopathy, lumbar region: Secondary | ICD-10-CM | POA: Diagnosis not present

## 2023-12-19 DIAGNOSIS — R2689 Other abnormalities of gait and mobility: Secondary | ICD-10-CM | POA: Diagnosis not present

## 2023-12-19 DIAGNOSIS — M6281 Muscle weakness (generalized): Secondary | ICD-10-CM | POA: Diagnosis not present

## 2023-12-19 NOTE — Therapy (Signed)
 OUTPATIENT PHYSICAL THERAPY THORACOLUMBAR TREATMENT   Patient Name: Jillian Huynh MRN: [id number] DOB:May 28, 1946, 78 y.o., female Today's Date: 12/19/2023  END OF SESSION:  PT End of Session - 12/19/23 1056     Visit Number 5    Number of Visits 16    Date for PT Re-Evaluation 12/24/23    Authorization Type [employer] MCR    Progress Note Due on Visit 10    PT Start Time 1100    PT Stop Time 1140    PT Time Calculation (min) 40 min    Activity Tolerance Patient tolerated treatment well    Behavior During Therapy WFL for tasks assessed/performed            Past Medical History:  Diagnosis Date   Diabetes mellitus without complication (HCC)    Hyperlipidemia    Hypertension    Past Surgical History:  Procedure Laterality Date   CHOLECYSTECTOMY     Patient Active Problem List   Diagnosis Date Noted   Lumbar spondylosis 10/25/2023   Chronic idiopathic constipation 10/25/2023   Movement disorder 09/30/2023   Hypertensive heart disease with heart failure (HCC) 08/07/2023   Type 2 diabetes mellitus with circulatory disorder (HCC) 08/07/2023   Depression, major, single episode, moderate (HCC) 07/10/2023   Hearing aid worn 11/28/2022   Systolic CHF (HCC) 05/21/2022   Cardiomyopathy (HCC) 05/21/2022   Left bundle branch block (LBBB) determined by electrocardiography 08/08/2021   Gastroesophageal reflux disease without esophagitis 09/19/2020   BMI 31.0-31.9,adult 05/25/2019   Fatty liver 12/25/2017   BP (high blood pressure) 03/28/2015   Dyslipidemia 03/28/2015   Insomnia 03/28/2015   Restless leg 03/28/2015   Mass of arm 03/14/2015   Diabetes mellitus type 2, controlled (HCC) 03/14/2015   Obstructive apnea 09/03/2012   HYPERKERATOSIS 04/01/2009    PCP: Alvan Dorothyann BIRCH, MD REFERRING PROVIDER: Curtis Debby PARAS,*   REFERRING DIAG: [phone] (ICD-10-CM) - Lumbar spondylosis   Rationale for Evaluation and Treatment: Rehabilitation  THERAPY DIAG:   Radiculopathy, lumbar region  Muscle weakness (generalized)  Other abnormalities of gait and mobility  ONSET DATE: one year  SUBJECTIVE:                                                                                                                                                                                           SUBJECTIVE STATEMENT: Patient reports she has had no back pain; states she is having a good day today and is walking better and feeling more motivated and energized compared to the last few days. Patient states her R side feels weaker than L; states dressing is getting easier with less freezing with  putting on pants.   PERTINENT HISTORY:  DM, HTN, lumbar spondylosis, restless leg syndrome  PAIN:  Are you having pain? Yes: NPRS scale: 0 now up to/10 Pain location: R hip to foot Pain description: sharp Aggravating factors: weight bearing Relieving factors: sitting  PRECAUTIONS: None  WEIGHT BEARING RESTRICTIONS: No  FALLS:  Has patient fallen in last 6 months? Yes. Number of falls 1 about 4 months ago; stepped in dog feces and slipped.  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  NEXT MD VISIT: March (neurologist)  OBJECTIVE:  Note: Objective measures were completed at Evaluation unless otherwise noted. DIAGNOSTIC FINDINGS:  Lumbar DG FINDINGS: Five lumbar-type vertebral bodies. Preserved vertebral body heights. No listhesis. Scattered diffuse moderate endplate osteophytes which are near bridging. Osteopenia. Prominent facet degenerative changes. No spondylolysis. Bridging osteophytes are seen along the lower thoracic spine at the edge of the imaging field  SENSATION: Intermitent tingling in  B feet usually starts around 4 pm  POSTURE: rounded shoulders, forward head, and flexed trunk   PALPATION: B gluteals tender and tight  LUMBAR ROM:  AROM eval  Flexion WFL  Extension WFL  Right lateral flexion WFL  Left lateral flexion Limited 50% compared to R  Right rotation Limited 50% compared to L  Left rotation WFL   (Blank rows = not tested)  LOWER EXTREMITY ROM:   WFL for tasks assessed  LOWER EXTREMITY MMT:  grossly 4-4+/5 in B LE  LUMBAR SPECIAL TESTS:  Straight leg raise test: Positive and Slump test: Positive for nerve tension R  FUNCTIONAL TESTS:  5 times sit to stand: 24.71 sec  Timed up and go (TUG): 13.84 sec Dynamic Gait Index: 14/24  10/31/23 BERG = 46/56 GAIT: Distance walked: 40 Assistive device utilized: None Level of assistance: Complete Independence Comments: decreased heel strike B, short step and stride length, forward trunk lean and decreased arm swing.  [facility] Adult PT Treatment:                                                DATE: 12/19/2023 Therapeutic Exercise: LTR + looking over opp shoulder x10 Core marching x10 Bridges 2x10 Shoulder flexion stretch --> R scap MWM assist from therapist T arm open stretch Timed STS Lumbar ROM  LAQ + 3#AW 2x10 (B) Standing marching + 3#AW 3x30 (UE support x 1) Standing hip abd + 3#AW x10 (B) Seated HS stretch (foot propped on block) Seated marching + 3#AW on feet (challenging)    [facility] Adult PT Treatment:                                                DATE: 12/16/23 Therapeutic Exercise: Supine Bridges x 8 reps Postural stretching with arms overhead and arms to a T Neuromuscular re-ed: Wall bumps working on weight shifting and toes lifting with posterior weight shifting Standing PWR! Exercises  PWR up x 10 reps (sit<>stand) PWR twist x 6 reps PWR step x 6 reps alternating PWR rock x  5 reps using door frame for cues to open into big motion Prone PWR! Exercises PWR up prone working on postural  lengthening PWR twist with cues to reach x 3 reps to each side PWR rock x 5 reps Therapeutic Activity: Worked on sit<>supine and breaking it into components Gait: Emphasizing bilateral heel strike and arm swing during gait x 350 feet                                                                                                                     [facility] Adult PT Treatment:                                                DATE: 11/20/2023 Therapeutic Exercise: Updating STGs STS + big arm overhead reach x10 STS on corner of table Standing heel & toe raises LAQ + 3#AW 2x10 (B) Seated marching 3x10 + 3#AW Therapeutic Activity: Walking + head turns & nods --> no AD, FWW Gait training with FWW Walking (no AD) --> added intervals with colored dots for stride length Walking with metronome Marching + 3#AW --> added head turns + counting to 3    PATIENT EDUCATION:  Education details: HEP Person educated: Patient Education method: Programmer, Multimedia, Demonstration, and Handouts Education comprehension: verbalized understanding and returned demonstration  HOME EXERCISE PROGRAM: Access Code: [email or web address] URL: [email or web address] Date: 12/19/2023 Prepared by: Lamarr Price  Exercises - Supine Bridge  - 1 x daily - 7 x weekly - 2 sets - 10 reps - Seated Long Arc Quad  - 1 x daily - 7 x weekly - 2 sets - 10 reps - Sit to Stand  - 3 x daily - 7 x weekly - 1-2 sets - 10 reps - Standing Heel Raises  - 1 x daily - 7 x weekly - 3 sets - 10 reps - Standing Ankle Dorsiflexion with Table Support  - 1 x daily - 7 x weekly - 3 sets - 10 reps - Standing Marching  - 1 x daily - 7 x weekly - 3 sets - 10 reps - Seated Hamstring Stretch  - 1 x daily - 7 x weekly - 3 sets - 10 reps - Seated Long Arc Quad with Ankle Weight  - 1 x daily - 7 x weekly - 3 sets - 10 reps - Seated March with Ankle Weights at Foot  - 1 x daily - 7 x weekly - 3 sets - 10 reps  Patient Education - Tips to reduce freezing  episodes with standing or walking   ASSESSMENT:  CLINICAL IMPRESSION: Cueing head turn in opposition to lateral trunk rotation improved throacolumbar mobility. Endurance and functional LE strengthening progressed with added ankle weights during standing exercises. Patient challenged with with resisted seated marching; cueing required to maintain ankle dorsiflexion with weight. Patient has met LTGs #6 & #4, demonstrating improved dynamic balance and  LE strength.   GOALS: Goals reviewed with patient? Yes  SHORT TERM GOALS: Target date: 11/26/2023   Patient will be independent with initial HEP. Baseline:  Goal status: *Needs further cues  2. Patient will report centralization of radicular symptoms.  Baseline:  Goal status: MET  3. Patient will be independent with supine to sit transfers. Baseline:  Goal status: MET   LONG TERM GOALS: Target date: 12/24/2023  Patient will be independent with advanced/ongoing HEP to improve outcomes and carryover.  Baseline:  Goal status: INITIAL  2.  Patient will be able to ambulate 600' in 6 min with LRAD with good safety to access community.  Baseline:  Goal status: INITIAL  3.  Patient to have no recurrence of R radicular pain.  Baseline:  Goal status: MET   4.  Patient will demonstrate decreased 5XSTS to less than 16 seconds showing improved functional strength and decreasing risk for falls. Baseline: 24.7 sec; 12/19/23 15 sec Goal status: MET  5.  Patient will demonstrate at least 19/24 on DGI to improve gait stability and reduce risk for falls. Baseline: TBD Goal status: INITIAL  6. Patient will demonstrate full/functional pain free lumbar ROM to perform ADLs.   Baseline: Goal status: MET   PLAN:  PT FREQUENCY: 2x/week  PT DURATION: 8 weeks  PLANNED INTERVENTIONS: 97164- PT Re-evaluation, 97110-Therapeutic exercises, 97530- Therapeutic activity, [id number]- Neuromuscular re-education, 97535- Self Care, [id number]- Manual therapy,  [id number]- Gait training, [phone]- Aquatic Therapy, 97014- Electrical stimulation (unattended), [phone]- Traction (mechanical), Patient/Family education, Balance training, Stair training, Taping, Dry Needling, Joint mobilization, Spinal mobilization, Cryotherapy, and Moist heat.  PLAN FOR NEXT SESSION: Re-eval next visit. Continue gait training with AD, lumbar ROM, LE strength, supine to sit transfers, postural strengthening, PWR exercise progression  Lamarr GORMAN Price, PTA 12/19/2023, 11:41 AM

## 2023-12-23 ENCOUNTER — Other Ambulatory Visit: Payer: Self-pay | Admitting: *Deleted

## 2023-12-23 MED ORDER — SEMAGLUTIDE (1 MG/DOSE) 4 MG/3ML ~~LOC~~ SOPN: 1.0000 mg | PEN_INJECTOR | SUBCUTANEOUS | 1 refills | Status: DC

## 2023-12-24 ENCOUNTER — Ambulatory Visit: Payer: Medicare HMO | Admitting: Physical Therapy

## 2023-12-24 ENCOUNTER — Encounter: Payer: Self-pay | Admitting: Physical Therapy

## 2023-12-24 DIAGNOSIS — M5416 Radiculopathy, lumbar region: Secondary | ICD-10-CM

## 2023-12-24 DIAGNOSIS — M6281 Muscle weakness (generalized): Secondary | ICD-10-CM | POA: Diagnosis not present

## 2023-12-24 DIAGNOSIS — R2689 Other abnormalities of gait and mobility: Secondary | ICD-10-CM | POA: Diagnosis not present

## 2023-12-24 NOTE — Therapy (Signed)
OUTPATIENT PHYSICAL THERAPY THORACOLUMBAR TREATMENT   Patient Name: Jillian Huynh MRN: [id number] DOB:1946-07-24, 78 y.o., female Today's Date: 12/24/2023  END OF SESSION:  PT End of Session - 12/24/23 1353     Visit Number 6    Number of Visits 16    Date for PT Re-Evaluation 02/04/24    Authorization Type [employer] MCR    Authorization - Visit Number 6    Progress Note Due on Visit 10    PT Start Time 1315    PT Stop Time 1355    PT Time Calculation (min) 40 min    Activity Tolerance Patient tolerated treatment well    Behavior During Therapy [facility] for tasks assessed/performed             Past Medical History:  Diagnosis Date   Diabetes mellitus without complication (HCC)    Hyperlipidemia    Hypertension    Past Surgical History:  Procedure Laterality Date   CHOLECYSTECTOMY     Patient Active Problem List   Diagnosis Date Noted   Lumbar spondylosis 10/25/2023   Chronic idiopathic constipation 10/25/2023   Movement disorder 09/30/2023   Hypertensive heart disease with heart failure (HCC) 08/07/2023   Type 2 diabetes mellitus with circulatory disorder (HCC) 08/07/2023   Depression, major, single episode, moderate (HCC) 07/10/2023   Hearing aid worn 11/28/2022   Systolic CHF (HCC) 05/21/2022   Cardiomyopathy (HCC) 05/21/2022   Left bundle branch block (LBBB) determined by electrocardiography 08/08/2021   Gastroesophageal reflux disease without esophagitis 09/19/2020   BMI 31.0-31.9,adult 05/25/2019   Fatty liver 12/25/2017   BP (high blood pressure) 03/28/2015   Dyslipidemia 03/28/2015   Insomnia 03/28/2015   Restless leg 03/28/2015   Mass of arm 03/14/2015   Diabetes mellitus type 2, controlled (HCC) 03/14/2015   Obstructive apnea 09/03/2012   HYPERKERATOSIS 04/01/2009    PCP: Agapito Games, MD REFERRING PROVIDER: Monica Becton,*   REFERRING DIAG: [id number] (ICD-10-CM) - Lumbar spondylosis   Rationale for Evaluation and Treatment:  Rehabilitation  THERAPY DIAG:  Radiculopathy, lumbar region  Muscle weakness (generalized)  Other abnormalities of gait and mobility  ONSET DATE: one year  SUBJECTIVE:                                                                                                                                                                                           SUBJECTIVE STATEMENT: Patient reports she is having a good day today. No pain down her leg. She is able to put pants on more easily and able to get in and out of bed more easily  PERTINENT HISTORY:  DM, HTN, lumbar  spondylosis, restless leg syndrome  PAIN:  Are you having pain? Yes: NPRS scale: 0 now up to/10 Pain location: R hip to foot Pain description: sharp Aggravating factors: weight bearing Relieving factors: sitting  PRECAUTIONS: None  WEIGHT BEARING RESTRICTIONS: No  FALLS:  Has patient fallen in last 6 months? Yes. Number of falls 1 about 4 months ago; stepped in dog feces and slipped.  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  NEXT MD VISIT: March (neurologist)  OBJECTIVE:  Note: Objective measures were completed at Evaluation unless otherwise noted. DIAGNOSTIC FINDINGS:  Lumbar DG FINDINGS: Five lumbar-type vertebral bodies. Preserved vertebral body heights. No listhesis. Scattered diffuse moderate endplate osteophytes which are near bridging. Osteopenia. Prominent facet degenerative changes. No spondylolysis. Bridging osteophytes are seen along the lower thoracic spine at the edge of the imaging field  SENSATION: Intermitent tingling in B feet usually starts around 4 pm  POSTURE: rounded shoulders, forward head, and flexed trunk    PALPATION: B gluteals tender and tight  LUMBAR ROM:  AROM eval  Flexion WFL  Extension WFL  Right lateral flexion WFL  Left lateral flexion Limited 50% compared to R  Right rotation Limited 50% compared to L  Left rotation WFL   (Blank rows = not tested)  LOWER EXTREMITY ROM:   WFL for tasks assessed  LOWER EXTREMITY MMT:  grossly 4-4+/5 in B LE  LUMBAR SPECIAL TESTS:  Straight leg raise test: Positive and Slump test: Positive for nerve tension R  FUNCTIONAL TESTS:  5 times sit to stand: 24.71 sec  Timed up and go (TUG): 13.84 sec Dynamic Gait Index: 14/24  10/31/23 BERG = 46/56 GAIT: Distance walked: 40 Assistive device utilized: None Level of assistance: Complete Independence Comments: decreased heel strike B, short step and stride length, forward trunk lean and decreased arm swing.  [facility] Adult PT Treatment:                                                DATE: 12/24/23 Therapeutic Exercise/Activity/NMR: Attempted 6 minute walk test. Pt fatigued after 2:45. Able to complete 210'. DGI:13/24 Standing: PWR! Up (sit to stand) PWR! Twist x 8  PWR! Step x 8 PWR! Rock x 6 Clock step with CGA focus on big movements and balance PWR! Up and PWR! Twist with physioball and targets to increase ROM Reaching for targets with physioball - diagonals   [facility] Adult PT Treatment:                                                DATE: 12/19/2023 Therapeutic Exercise: LTR + looking over opp shoulder x10 Core marching x10 Bridges 2x10 Shoulder flexion stretch --> R scap MWM assist from therapist T arm open stretch Timed STS Lumbar ROM  LAQ + 3#AW 2x10 (B) Standing marching + 3#AW 3x30" (UE support x 1) Standing hip abd + 3#AW x10 (B) Seated HS stretch (foot propped on block) Seated marching + 3#AW on feet (challenging)    [facility]  Adult PT Treatment:                                                DATE: 12/16/23 Therapeutic Exercise: Supine Bridges x 8 reps Postural stretching with  arms overhead and arms to a T Neuromuscular re-ed: Wall bumps working on weight shifting and toes lifting with posterior weight shifting Standing PWR! Exercises  PWR up x 10 reps (sit<>stand) PWR twist x 6 reps PWR step x 6 reps alternating PWR rock x 5 reps using door frame for cues to open into big motion Prone PWR! Exercises PWR up prone working on postural lengthening PWR twist with cues to reach x 3 reps to each side PWR rock x 5 reps Therapeutic Activity: Worked on sit<>supine and breaking it into components Gait: Emphasizing bilateral heel strike and arm swing during gait x 350 feet                                                                                                                      PATIENT EDUCATION:  Education details: HEP Person educated: Patient Education method: Programmer, multimedia, Facilities manager, and Handouts Education comprehension: verbalized understanding and returned demonstration  HOME EXERCISE PROGRAM: Access Code: [email or web address] URL: [email or web address] Date: 12/19/2023 Prepared by: Carlynn Herald  Exercises - Supine Bridge  - 1 x daily - 7 x weekly - 2 sets - 10 reps - Seated Long Arc Quad  - 1 x daily - 7 x weekly - 2 sets - 10 reps - Sit to Stand  - 3 x daily - 7 x weekly - 1-2 sets - 10 reps - Standing Heel Raises  - 1 x daily - 7 x weekly - 3 sets - 10 reps - Standing Ankle Dorsiflexion with Table Support  - 1 x daily - 7 x weekly - 3 sets - 10 reps - Standing Marching  - 1 x daily - 7 x weekly - 3 sets - 10 reps - Seated Hamstring Stretch  - 1 x daily - 7 x weekly - 3 sets - 10 reps - Seated Long Arc Quad with Ankle Weight  - 1 x daily - 7 x weekly - 3 sets - 10 reps - Seated March with Ankle Weights at Foot  - 1 x daily - 7 x weekly - 3 sets - 10 reps  Patient Education - Tips to reduce freezing episodes with standing or walking   ASSESSMENT:  CLINICAL IMPRESSION: Pt has made great improvements in radicular pain and  trunk/spinal mobility. She continues with deficits in dynamic balance and gait and will continue to benefit from skilled to address ongoing deficits and work towards LTGs.   GOALS: Goals reviewed with patient? Yes  SHORT TERM GOALS: Target date: 11/26/2023   Patient will be independent with initial HEP. Baseline:  Goal status: *Needs further cues  2. Patient will report centralization  of radicular symptoms.  Baseline:  Goal status: MET  3. Patient will be independent with supine to sit transfers. Baseline:  Goal status: MET   LONG TERM GOALS: Target date: 02/04/2024    Patient will be independent with advanced/ongoing HEP to improve outcomes and carryover.  Baseline:  Goal status: IN PROGRESS  2.  Patient will be able to ambulate 600' in 6 min with LRAD with good safety to access community.  Baseline: 2:45 - 210' Goal status: in progress  3.  Patient to have no recurrence of R radicular pain.  Baseline:  Goal status: MET   4.  Patient will demonstrate decreased 5XSTS to less than 16 seconds showing improved functional strength and decreasing risk for falls. Baseline: 24.7 sec; 12/19/23 15 sec Goal status: MET  5.  Patient will demonstrate at least 19/24 on DGI to improve gait stability and reduce risk for falls. Baseline: 13/24 on 12/24/23 Goal status: IN PROGRESS  6. Patient will demonstrate full/functional pain free lumbar ROM to perform ADLs.   Baseline: Goal status: MET   PLAN:  PT FREQUENCY: 2x/week  PT DURATION: 8 weeks  PLANNED INTERVENTIONS: 97164- PT Re-evaluation, 97110-Therapeutic exercises, 97530- Therapeutic activity, [id number]- Neuromuscular re-education, 97535- Self Care, 16109- Manual therapy, [id number]- Gait training, [phone]- Aquatic Therapy, 97014- Electrical stimulation (unattended), [phone]- Traction (mechanical), Patient/Family education, Balance training, Stair training, Taping, Dry Needling, Joint mobilization, Spinal mobilization, Cryotherapy, and Moist  heat.  PLAN FOR NEXT SESSION: Continue gait training with AD, LE endurance, supine to sit transfers, postural strengthening, PWR exercise progression  Cristi Gwynn, PT 12/24/2023, 1:54 PM

## 2023-12-26 ENCOUNTER — Ambulatory Visit: Payer: Medicare HMO

## 2023-12-26 DIAGNOSIS — R2689 Other abnormalities of gait and mobility: Secondary | ICD-10-CM | POA: Diagnosis not present

## 2023-12-26 DIAGNOSIS — M6281 Muscle weakness (generalized): Secondary | ICD-10-CM

## 2023-12-26 DIAGNOSIS — M5416 Radiculopathy, lumbar region: Secondary | ICD-10-CM | POA: Diagnosis not present

## 2023-12-26 NOTE — Therapy (Signed)
OUTPATIENT PHYSICAL THERAPY THORACOLUMBAR TREATMENT   Patient Name: Jillian Huynh MRN: [id number] DOB:1946/02/13, 78 y.o., female Today's Date: 12/26/2023  END OF SESSION:  PT End of Session - 12/26/23 1149     Visit Number 7    Number of Visits 16    Date for PT Re-Evaluation 02/04/24    Authorization Type [employer] MCR    Progress Note Due on Visit 10    PT Start Time 1150    PT Stop Time 1228    PT Time Calculation (min) 38 min    Activity Tolerance Patient tolerated treatment well    Behavior During Therapy WFL for tasks assessed/performed             Past Medical History:  Diagnosis Date   Diabetes mellitus without complication (HCC)    Hyperlipidemia    Hypertension    Past Surgical History:  Procedure Laterality Date   CHOLECYSTECTOMY     Patient Active Problem List   Diagnosis Date Noted   Lumbar spondylosis 10/25/2023   Chronic idiopathic constipation 10/25/2023   Movement disorder 09/30/2023   Hypertensive heart disease with heart failure (HCC) 08/07/2023   Type 2 diabetes mellitus with circulatory disorder (HCC) 08/07/2023   Depression, major, single episode, moderate (HCC) 07/10/2023   Hearing aid worn 11/28/2022   Systolic CHF (HCC) 05/21/2022   Cardiomyopathy (HCC) 05/21/2022   Left bundle branch block (LBBB) determined by electrocardiography 08/08/2021   Gastroesophageal reflux disease without esophagitis 09/19/2020   BMI 31.0-31.9,adult 05/25/2019   Fatty liver 12/25/2017   BP (high blood pressure) 03/28/2015   Dyslipidemia 03/28/2015   Insomnia 03/28/2015   Restless leg 03/28/2015   Mass of arm 03/14/2015   Diabetes mellitus type 2, controlled (HCC) 03/14/2015   Obstructive apnea 09/03/2012   HYPERKERATOSIS 04/01/2009    PCP: Agapito Games, MD REFERRING PROVIDER: Monica Becton,*   REFERRING DIAG: [phone] (ICD-10-CM) - Lumbar spondylosis   Rationale for Evaluation and Treatment: Rehabilitation  THERAPY DIAG:  Muscle  weakness (generalized)  Other abnormalities of gait and mobility  Radiculopathy, lumbar region  ONSET DATE: one year  SUBJECTIVE:                                                                                                                                                                                           SUBJECTIVE STATEMENT: Patient reports she is having "more better days", states she has not had any pain in leg for a few weeks; states getting in/out of bed is getting easier.   PERTINENT HISTORY:  DM, HTN, lumbar spondylosis, restless leg syndrome  PAIN:  Are you having pain? Yes:  NPRS scale: 0 now up to/10 Pain location: R hip to foot Pain description: sharp Aggravating factors: weight bearing Relieving factors: sitting  PRECAUTIONS: None  WEIGHT BEARING RESTRICTIONS: No  FALLS:  Has patient fallen in last 6 months? Yes. Number of falls 1 about 4 months ago; stepped in dog feces and slipped.  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  NEXT MD VISIT: end of March (neurologist)  OBJECTIVE:  Note: Objective measures were completed at Evaluation unless otherwise noted. DIAGNOSTIC FINDINGS:  Lumbar DG FINDINGS: Five lumbar-type vertebral bodies. Preserved vertebral body heights. No listhesis. Scattered diffuse moderate endplate osteophytes which are near bridging. Osteopenia. Prominent facet degenerative changes. No spondylolysis. Bridging osteophytes are seen along the lower thoracic spine at the edge of the imaging field  SENSATION: Intermitent tingling in B feet usually starts around 4 pm  POSTURE: rounded shoulders, forward head, and flexed trunk   PALPATION: B gluteals tender and  tight  LUMBAR ROM:  AROM eval  Flexion WFL  Extension WFL  Right lateral flexion WFL  Left lateral flexion Limited 50% compared to R  Right rotation Limited 50% compared to L  Left rotation WFL   (Blank rows = not tested)  LOWER EXTREMITY ROM:   WFL for tasks assessed  LOWER EXTREMITY MMT:  grossly 4-4+/5 in B LE  LUMBAR SPECIAL TESTS:  Straight leg raise test: Positive and Slump test: Positive for nerve tension R  FUNCTIONAL TESTS:  5 times sit to stand: 24.71 sec  Timed up and go (TUG): 13.84 sec Dynamic Gait Index: 14/24  10/31/23 BERG = 46/56 GAIT: Distance walked: 40 Assistive device utilized: None Level of assistance: Complete Independence Comments: decreased heel strike B, short step and stride length, forward trunk lean and decreased arm swing.  [facility] Adult PT Treatment:                                                DATE: 12/26/2023 Therapeutic Exercise: STS + overhear reach in standing x10 STS + cross body punches x10 Standing diagonal reaches with orange PB Reaching orange PB to moving targets Walking + cross body punches with 1#DB Eccentric squats + 3#MB taps downs on mat table Standing rows + RTB Shoulder extension press down + RTB --> added unilateral march (close SBA) Therapeutic Activity: Functional endurance training: Walking x80' + reciprocal arm swing Marching x20', 80' --> fatigued and requested seated rest break Walking x160' --> started to fatigue around 120' Walking + upright postural cues x 80'    [facility] Adult PT Treatment:                                                DATE: 12/24/23 Therapeutic Exercise/Activity/NMR: Attempted 6 minute walk test. Pt fatigued after 2:45. Able to complete 210'. DGI:13/24 Standing: PWR! Up (sit to stand) PWR! Twist x 8  PWR! Step x 8 PWR! Rock x 6 Clock step with CGA focus on big movements and balance PWR! Up and PWR! Twist  with physioball and targets to increase ROM Reaching for targets with physioball -  diagonals   [facility] Adult PT Treatment:                                                DATE: 12/19/2023 Therapeutic Exercise: LTR + looking over opp shoulder x10 Core marching x10 Bridges 2x10 Shoulder flexion stretch --> R scap MWM assist from therapist T arm open stretch Timed STS Lumbar ROM  LAQ + 3#AW 2x10 (B) Standing marching + 3#AW 3x30" (UE support x 1) Standing hip abd + 3#AW x10 (B) Seated HS stretch (foot propped on block) Seated marching + 3#AW on feet (challenging)    [facility] Adult PT Treatment:                                                DATE: 12/16/23 Therapeutic Exercise: Supine Bridges x 8 reps Postural stretching with arms overhead and arms to a T Neuromuscular re-ed: Wall bumps working on weight shifting and toes lifting with posterior weight shifting Standing PWR! Exercises  PWR up x 10 reps (sit<>stand) PWR twist x 6 reps PWR step x 6 reps alternating PWR rock x 5 reps using door frame for cues to open into big motion Prone PWR! Exercises PWR up prone working on postural lengthening PWR twist with cues to reach x 3 reps to each side PWR rock x 5 reps Therapeutic Activity: Worked on sit<>supine and breaking it into components Gait: Emphasizing bilateral heel strike and arm swing during gait x 350 feet                                                                                                                      PATIENT EDUCATION:  Education details: HEP Person educated: Patient Education method: Programmer, multimedia, Facilities manager, and Handouts Education comprehension: verbalized understanding and returned demonstration  HOME EXERCISE PROGRAM: Access Code: [email or web address] URL: [email or web address] Date: 12/19/2023 Prepared by: Carlynn Herald  Exercises - Supine Bridge  - 1 x daily - 7 x weekly - 2 sets - 10 reps - Seated Long Arc Quad  - 1 x daily - 7 x weekly - 2 sets - 10 reps - Sit to Stand  - 3 x daily - 7 x weekly - 1-2 sets - 10 reps -  Standing Heel Raises  - 1 x daily - 7 x weekly - 3 sets - 10 reps - Standing Ankle Dorsiflexion with Table Support  - 1 x daily - 7 x weekly - 3 sets - 10 reps - Standing Marching  - 1 x daily - 7 x weekly - 3 sets - 10 reps - Seated Hamstring Stretch  - 1 x daily - 7 x weekly - 3  sets - 10 reps - Seated Long Arc Quad with Ankle Weight  - 1 x daily - 7 x weekly - 3 sets - 10 reps - Seated March with Ankle Weights at Foot  - 1 x daily - 7 x weekly - 3 sets - 10 reps  Patient Education - Tips to reduce freezing episodes with standing or walking   ASSESSMENT:  CLINICAL IMPRESSION: Session focused on endurance training and progressed tolerance for increasing walking distance. Overall patient began to fatigue after 120' of walking, requesting seated rest break after completing 160' distance. Large amplitude movements continued with UE/LE strengthening and postural exercises.    GOALS: Goals reviewed with patient? Yes  SHORT TERM GOALS: Target date: 11/26/2023   Patient will be independent with initial HEP. Baseline:  Goal status: *Needs further cues  2. Patient will report centralization of radicular symptoms.  Baseline:  Goal status: MET  3. Patient will be independent with supine to sit transfers. Baseline:  Goal status: MET   LONG TERM GOALS: Target date: 02/04/2024  Patient will be independent with advanced/ongoing HEP to improve outcomes and carryover.  Baseline:  Goal status: IN PROGRESS  2.  Patient will be able to ambulate 600' in 6 min with LRAD with good safety to access community.  Baseline: 2:45 - 210' Goal status: in progress  3.  Patient to have no recurrence of R radicular pain.  Baseline:  Goal status: MET   4.  Patient will demonstrate decreased 5XSTS to less than 16 seconds showing improved functional strength and decreasing risk for falls. Baseline: 24.7 sec; 12/19/23 15 sec Goal status: MET  5.  Patient will demonstrate at least 19/24 on DGI to improve  gait stability and reduce risk for falls. Baseline: 13/24 on 12/24/23 Goal status: IN PROGRESS  6. Patient will demonstrate full/functional pain free lumbar ROM to perform ADLs.   Baseline: Goal status: MET   PLAN:  PT FREQUENCY: 2x/week  PT DURATION: 8 weeks  PLANNED INTERVENTIONS: 97164- PT Re-evaluation, 97110-Therapeutic exercises, 97530- Therapeutic activity, [id number]- Neuromuscular re-education, 97535- Self Care, 16109- Manual therapy, [id number]- Gait training, [phone]- Aquatic Therapy, 97014- Electrical stimulation (unattended), [phone]- Traction (mechanical), Patient/Family education, Balance training, Stair training, Taping, Dry Needling, Joint mobilization, Spinal mobilization, Cryotherapy, and Moist heat.  PLAN FOR NEXT SESSION: Continue gait training & endurance, LE endurance, postural strengthening, PWR exercise progression  Sanjuana Mae, PTA 12/26/2023, 12:34 PM

## 2023-12-30 ENCOUNTER — Encounter: Payer: Self-pay | Admitting: Rehabilitative and Restorative Service Providers"

## 2023-12-30 ENCOUNTER — Ambulatory Visit: Payer: Medicare HMO | Admitting: Rehabilitative and Restorative Service Providers"

## 2023-12-30 DIAGNOSIS — M6281 Muscle weakness (generalized): Secondary | ICD-10-CM | POA: Diagnosis not present

## 2023-12-30 DIAGNOSIS — M5416 Radiculopathy, lumbar region: Secondary | ICD-10-CM

## 2023-12-30 DIAGNOSIS — R2689 Other abnormalities of gait and mobility: Secondary | ICD-10-CM | POA: Diagnosis not present

## 2023-12-30 NOTE — Therapy (Signed)
OUTPATIENT PHYSICAL THERAPY THORACOLUMBAR TREATMENT   Patient Name: Jillian Huynh MRN: [id number] DOB:Jul 16, 1946, 78 y.o., female Today's Date: 12/30/2023  END OF SESSION:  PT End of Session - 12/30/23 1151     Visit Number 8    Number of Visits 16    Date for PT Re-Evaluation 02/04/24    Authorization Type [employer] MCR    Progress Note Due on Visit 10    PT Start Time 1150    PT Stop Time 1230    PT Time Calculation (min) 40 min    Activity Tolerance Patient tolerated treatment well    Behavior During Therapy [facility] for tasks assessed/performed              Past Medical History:  Diagnosis Date   Diabetes mellitus without complication (HCC)    Hyperlipidemia    Hypertension    Past Surgical History:  Procedure Laterality Date   CHOLECYSTECTOMY     Patient Active Problem List   Diagnosis Date Noted   Lumbar spondylosis 10/25/2023   Chronic idiopathic constipation 10/25/2023   Movement disorder 09/30/2023   Hypertensive heart disease with heart failure (HCC) 08/07/2023   Type 2 diabetes mellitus with circulatory disorder (HCC) 08/07/2023   Depression, major, single episode, moderate (HCC) 07/10/2023   Hearing aid worn 11/28/2022   Systolic CHF (HCC) 05/21/2022   Cardiomyopathy (HCC) 05/21/2022   Left bundle branch block (LBBB) determined by electrocardiography 08/08/2021   Gastroesophageal reflux disease without esophagitis 09/19/2020   BMI 31.0-31.9,adult 05/25/2019   Fatty liver 12/25/2017   BP (high blood pressure) 03/28/2015   Dyslipidemia 03/28/2015   Insomnia 03/28/2015   Restless leg 03/28/2015   Mass of arm 03/14/2015   Diabetes mellitus type 2, controlled (HCC) 03/14/2015   Obstructive apnea 09/03/2012   HYPERKERATOSIS 04/01/2009    PCP: Agapito Games, MD REFERRING PROVIDER: Monica Becton,*  REFERRING DIAG: [phone] (ICD-10-CM) - Lumbar spondylosis   Rationale for Evaluation and Treatment: Rehabilitation  THERAPY DIAG:  No  diagnosis found.  ONSET DATE: one year  SUBJECTIVE:                                                                                                                                                                                           SUBJECTIVE STATEMENT: The patient reports she notes significant improvement with therapy. "Most days I can put my own pants on instead of having help."  She is getting in and out of bed easier.  She notes she is still not able to walk long distances. "I get so tired with walking that I feel like I need to crawl. My legs  feel like they weigh a ton."  PERTINENT HISTORY:  DM, HTN, lumbar spondylosis, restless leg syndrome  PAIN:  Are you having pain? NO  PRECAUTIONS: None  WEIGHT BEARING RESTRICTIONS: No  FALLS:  Has patient fallen in last 6 months? Yes. Number of falls 1 about 4 months ago; stepped in dog feces and slipped.  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  NEXT MD VISIT: end of March (neurologist)  OBJECTIVE:  Note: Objective measures were completed at Evaluation unless otherwise noted. DIAGNOSTIC FINDINGS:  Lumbar DG FINDINGS: Five lumbar-type vertebral bodies. Preserved vertebral body heights. No listhesis. Scattered diffuse moderate endplate osteophytes which are near bridging. Osteopenia. Prominent facet degenerative changes. No spondylolysis. Bridging osteophytes are seen along the lower thoracic spine at the edge of the imaging field  SENSATION: Intermitent tingling in B feet usually starts around 4 pm  POSTURE: rounded shoulders, forward head, and flexed trunk   PALPATION: B gluteals tender and tight  LUMBAR ROM:  AROM eval  Flexion WFL  Extension WFL  Right  lateral flexion WFL  Left lateral flexion Limited 50% compared to R  Right rotation Limited 50% compared to L  Left rotation WFL   (Blank rows = not tested)  LOWER EXTREMITY ROM:   WFL for tasks assessed  LOWER EXTREMITY MMT:  grossly 4-4+/5 in B LE  LUMBAR SPECIAL TESTS:  Straight leg raise test: Positive and Slump test: Positive for nerve tension R  FUNCTIONAL TESTS:  5 times sit to stand: 24.71 sec  Timed up and go (TUG): 13.84 sec Dynamic Gait Index: 14/24  10/31/23 BERG = 46/56 GAIT: Distance walked: 40 Assistive device utilized: None Level of assistance: Complete Independence Comments: decreased heel strike B, short step and stride length, forward trunk lean and decreased arm swing.  [facility] Adult PT Treatment:                                                DATE: 12/30/23 Neuromuscular re-ed: Seated PWR! Up x 10 reps PWR! Rotation/twist x 10 reps Seated lifting hips for placing cards under bottom for therapeutic training/ hygiene tasks Standing PWR! Exercises  PWR up x 10 reps (sit<>stand) PWR twist x 10 reps R and L with door frame for cues to open PWR step x 6 reps alternating Les to colorful targets to randomize stepping PWR rock x 5 reps using door frame for cues to open into big motion Therapeutic Activity: Worked on sit<>supine and breaking it into components Gait: Emphasizing bilateral heel strike and arm swing during gait x 150 ft x 2 reps Gait with cues arm swing x 80 ft x 4 reps   [facility] Adult PT Treatment:                                                DATE: 12/26/2023 Therapeutic Exercise: STS + overhear reach in standing x10 STS + cross body punches x10 Standing diagonal reaches with orange PB Reaching orange PB to moving targets  Walking + cross body punches with 1#DB Eccentric squats + 3#MB taps downs on mat table Standing rows + RTB Shoulder extension press down + RTB --> added unilateral march (close SBA) Therapeutic Activity: Functional endurance  training: Walking x80' + reciprocal arm swing Marching x20', 80' --> fatigued and requested seated rest break Walking x160' --> started to fatigue around 120' Walking + upright postural cues x 80'   [facility] Adult PT Treatment:                                                DATE: 12/24/23 Therapeutic Exercise/Activity/NMR: Attempted 6 minute walk test. Pt fatigued after 2:45. Able to complete 210'. DGI:13/24 Standing: PWR! Up (sit to stand) PWR! Twist x 8  PWR! Step x 8 PWR! Rock x 6 Clock step with CGA focus on big movements and balance PWR! Up and PWR! Twist with physioball and targets to increase ROM Reaching for targets with physioball - diagonals                                                                                                                    PATIENT EDUCATION:  Education details: HEP Person educated: Patient Education method: Explanation, Demonstration, and Handouts Education comprehension: verbalized understanding and returned demonstration  HOME EXERCISE PROGRAM: Access Code: [email or web address] URL: [email or web address] Date: 12/19/2023 Prepared by: Carlynn Herald  Exercises - Supine Bridge  - 1 x daily - 7 x weekly - 2 sets - 10 reps - Seated Long Arc Quad  - 1 x daily - 7 x weekly - 2 sets - 10 reps - Sit to Stand  - 3 x daily - 7 x weekly - 1-2 sets - 10 reps - Standing Heel Raises  - 1 x daily - 7 x weekly - 3 sets - 10 reps - Standing Ankle Dorsiflexion with Table Support  - 1 x daily - 7 x weekly - 3 sets - 10 reps - Standing Marching  - 1 x daily - 7 x weekly - 3 sets - 10 reps - Seated Hamstring Stretch  - 1 x daily - 7 x weekly - 3 sets - 10 reps - Seated Long Arc Quad with Ankle Weight  - 1 x daily - 7 x weekly - 3 sets - 10 reps - Seated March with Ankle Weights at Foot  - 1 x daily - 7 x weekly - 3 sets - 10 reps  Patient Education - Tips to reduce freezing episodes with standing or walking   ASSESSMENT:  CLINICAL IMPRESSION: The  patient notes low back pain is improved, however endurance with gait is limited. She notes improvement with mobility in the home and community. She is responding well to PWR! ([facility]) Exercises and is improving with amplitude of movement. PT is working on functional mobility working towards [employer].   GOALS: Goals reviewed with patient?  Yes  SHORT TERM GOALS: Target date: 11/26/2023   Patient will be independent with initial HEP. Baseline:  Goal status: *Needs further cues  2. Patient will report centralization of radicular symptoms.  Baseline:  Goal status: MET  3. Patient will be independent with supine to sit transfers. Baseline:  Goal status: MET   LONG TERM GOALS: Target date: 02/04/2024  Patient will be independent with advanced/ongoing HEP to improve outcomes and carryover.  Baseline:  Goal status: IN PROGRESS  2.  Patient will be able to ambulate 600' in 6 min with LRAD with good safety to access community.  Baseline: 2:45 - 210' Goal status: in progress  3.  Patient to have no recurrence of R radicular pain.  Baseline:  Goal status: MET   4.  Patient will demonstrate decreased 5XSTS to less than 16 seconds showing improved functional strength and decreasing risk for falls. Baseline: 24.7 sec; 12/19/23 15 sec Goal status: MET  5.  Patient will demonstrate at least 19/24 on DGI to improve gait stability and reduce risk for falls. Baseline: 13/24 on 12/24/23 Goal status: IN PROGRESS  6. Patient will demonstrate full/functional pain free lumbar ROM to perform ADLs.   Baseline: Goal status: MET   PLAN:  PT FREQUENCY: 2x/week  PT DURATION: 8 weeks  PLANNED INTERVENTIONS: 97164- PT Re-evaluation, 97110-Therapeutic exercises, 97530- Therapeutic activity, [id number]- Neuromuscular re-education, 97535- Self Care, [id number]- Manual therapy, [id number]- Gait training, [id number]- Aquatic Therapy, 97014- Electrical stimulation (unattended), [phone]- Traction (mechanical),  Patient/Family education, Balance training, Stair training, Taping, Dry Needling, Joint mobilization, Spinal mobilization, Cryotherapy, and Moist heat.  PLAN FOR NEXT SESSION: Continue gait training & endurance, LE endurance, postural strengthening, PWR exercise progression, plan to give community resources for PWR! Class in preparation for d/c (she could ask neurology in [place] if closer community class).  Miyani Cronic, PT 12/30/2023, 12:55 PM

## 2023-12-31 ENCOUNTER — Ambulatory Visit (INDEPENDENT_AMBULATORY_CARE_PROVIDER_SITE_OTHER): Payer: Medicare HMO | Admitting: Family Medicine

## 2023-12-31 ENCOUNTER — Encounter: Payer: Self-pay | Admitting: Family Medicine

## 2023-12-31 VITALS — BP 100/67 | HR 66 | Ht 62.0 in | Wt 160.0 lb

## 2023-12-31 DIAGNOSIS — R000 Tachycardia, unspecified: Secondary | ICD-10-CM | POA: Insufficient documentation

## 2023-12-31 DIAGNOSIS — E118 Type 2 diabetes mellitus with unspecified complications: Secondary | ICD-10-CM

## 2023-12-31 DIAGNOSIS — E876 Hypokalemia: Secondary | ICD-10-CM

## 2023-12-31 DIAGNOSIS — I110 Hypertensive heart disease with heart failure: Secondary | ICD-10-CM | POA: Diagnosis not present

## 2023-12-31 LAB — POCT GLYCOSYLATED HEMOGLOBIN (HGB A1C): Hemoglobin A1C: 7.2 % — AB (ref 4.0–5.6)

## 2023-12-31 NOTE — Assessment & Plan Note (Signed)
She did follow-up with cardiology since the episodes where she was in the hospital for 24 hours.  They do have her on apixaban and they are doing further workup with a cardiac monitor which she said she turned in about a week ago.  If it comes back negative for A-fib or a flutter then they will likely take her off of the apixaban.  She definitely is feeling much better and has not had any more episodes of tachycardia or palpitations.

## 2023-12-31 NOTE — Assessment & Plan Note (Addendum)
A1C is 7.2 which is similar to what it was previously.  She knows she needs to do a little bit more work on her diet and would rather do that they make adjustments to her current regimen.  Continue with semaglutide.

## 2023-12-31 NOTE — Assessment & Plan Note (Signed)
Pressure on the low end especially with combination of Entresto and metoprolol.  Continue to  monitor carefully.

## 2023-12-31 NOTE — Patient Instructions (Signed)
Let me know if you run the new pool coupon card for your blood thinner and it does not work.

## 2023-12-31 NOTE — Progress Notes (Signed)
Established Patient Office Visit  Subjective  Patient ID: Jillian Huynh, female    DOB: 03/31/1946  Age: 78 y.o. MRN: [id number]  Chief Complaint  Patient presents with   Diabetes    HPI  So wanted to let me know that she went to the emergency department when she was traveling to [place] to visit family she felt like her heart was pounding and so went to a local urgent care they ended up transferring her via EMS to the ED.  She was seen at [facility] which is part of the [facility].  They kept her for about 24 hours.  Her potassium and magnesium were both low.  Her sister is hear with her today. She has completed PT and if feeling and moving so much better.    Appt with Neurology is not for another month or so.       ROS    Objective:     BP 100/67   Pulse 66   Ht 5\' 2"  (1.575 m)   Wt 160 lb (72.6 kg)   SpO2 100%   BMI 29.26 kg/m    Physical Exam Vitals and nursing note reviewed.  Constitutional:      Appearance: Normal appearance.  HENT:     Head: Normocephalic and atraumatic.  Eyes:     Conjunctiva/sclera: Conjunctivae normal.  Cardiovascular:     Rate and Rhythm: Normal rate and regular rhythm.  Pulmonary:     Effort: Pulmonary effort is normal.     Breath sounds: Normal breath sounds.  Skin:    General: Skin is warm and dry.  Neurological:     Mental Status: She is alert.  Psychiatric:        Mood and Affect: Mood normal.      Results for orders placed or performed in visit on 12/31/23  POCT HgB A1C  Result Value Ref Range   Hemoglobin A1C 7.2 (A) 4.0 - 5.6 %   HbA1c POC (<> result, manual entry)     HbA1c, POC (prediabetic range)     HbA1c, POC (controlled diabetic range)        The ASCVD Risk score (Arnett DK, et al., 2019) failed to calculate for the following reasons:   The valid total cholesterol range is 130 to 320 mg/dL    Assessment & Plan:   Problem List Items Addressed This Visit        Cardiovascular and Mediastinum   Hypertensive heart disease with heart failure (HCC)   Pressure on the low end especially with combination of Entresto and metoprolol.  Continue to  monitor carefully.        Endocrine   Diabetes mellitus type 2, controlled (HCC) - Primary   A1C is 7.2 which is similar to what it was previously.  She knows she needs to do a little bit more work on her diet and would rather do that they make adjustments to her current regimen.  Continue with semaglutide.      Relevant Orders   POCT HgB A1C (Completed)   Urine Microalbumin w/creat. ratio     Other   Wide-complex tachycardia   She did follow-up with cardiology since the episodes where she was in the hospital for 24 hours.  They do have her on apixaban and they are doing further workup with a cardiac monitor which she said she turned in about a week ago.  If it comes back negative for A-fib or a flutter then they  will likely take her off of the apixaban.  She definitely is feeling much better and has not had any more episodes of tachycardia or palpitations.      Other Visit Diagnoses       Hypokalemia       Relevant Orders   Basic Metabolic Panel (BMET)     Hypomagnesemia       Relevant Orders   Magnesium      Plan to recheck potassium and mag today. She is not on any supplements.    On the apixaban she has a coupon card for 30 days but when she tried to use it at the pharmacy they said it had already been used and activated but she has a second 1 so she is going to try to take that by the pharmacy today if that still does not work please let us know and I will get her in contact with our clinical pharmacist Elnita Maxwell.   Return in about 13 weeks (around 03/31/2024) for Diabetes follow-up.   I spent 40 minutes on the day of the encounter to include pre-visit record review, face-to-face time with the patient and post visit ordering of test.    Nani Gasser, MD

## 2024-01-01 ENCOUNTER — Encounter: Payer: Self-pay | Admitting: Family Medicine

## 2024-01-01 ENCOUNTER — Ambulatory Visit: Payer: Medicare HMO | Admitting: Rehabilitative and Restorative Service Providers"

## 2024-01-01 ENCOUNTER — Encounter: Payer: Self-pay | Admitting: Rehabilitative and Restorative Service Providers"

## 2024-01-01 DIAGNOSIS — M5416 Radiculopathy, lumbar region: Secondary | ICD-10-CM

## 2024-01-01 DIAGNOSIS — R2689 Other abnormalities of gait and mobility: Secondary | ICD-10-CM | POA: Diagnosis not present

## 2024-01-01 DIAGNOSIS — M6281 Muscle weakness (generalized): Secondary | ICD-10-CM | POA: Diagnosis not present

## 2024-01-01 LAB — MICROALBUMIN / CREATININE URINE RATIO
Creatinine, Urine: 55.3 mg/dL
Microalb/Creat Ratio: 7 mg/g{creat} (ref 0–29)
Microalbumin, Urine: 3.7 ug/mL

## 2024-01-01 LAB — BASIC METABOLIC PANEL
BUN/Creatinine Ratio: 35 — ABNORMAL HIGH (ref 12–28)
BUN: 19 mg/dL (ref 8–27)
CO2: 23 mmol/L (ref 20–29)
Calcium: 10.1 mg/dL (ref 8.7–10.3)
Chloride: 103 mmol/L (ref 96–106)
Creatinine, Ser: 0.55 mg/dL — ABNORMAL LOW (ref 0.57–1.00)
Glucose: 128 mg/dL — ABNORMAL HIGH (ref 70–99)
Potassium: 4.8 mmol/L (ref 3.5–5.2)
Sodium: 139 mmol/L (ref 134–144)
eGFR: 94 mL/min/{1.73_m2} (ref >59)

## 2024-01-01 LAB — MAGNESIUM: Magnesium: 2.1 mg/dL (ref 1.6–2.3)

## 2024-01-01 LAB — SPECIMEN STATUS REPORT

## 2024-01-01 NOTE — Progress Notes (Signed)
 Your lab work is within acceptable range and there are no concerning findings.   ?

## 2024-01-01 NOTE — Therapy (Signed)
OUTPATIENT PHYSICAL THERAPY THORACOLUMBAR TREATMENT   Patient Name: Jillian Huynh MRN: [id number] DOB:12-07-1945, 78 y.o., female Today's Date: 01/01/2024  END OF SESSION:  PT End of Session - 01/01/24 0933     Visit Number 9    Number of Visits 16    Date for PT Re-Evaluation 02/04/24    Authorization Type [employer] MCR    Progress Note Due on Visit 10    PT Start Time 0933    PT Stop Time 1015    PT Time Calculation (min) 42 min    Activity Tolerance Patient tolerated treatment well    Behavior During Therapy [facility] for tasks assessed/performed               Past Medical History:  Diagnosis Date   Diabetes mellitus without complication (HCC)    Hyperlipidemia    Hypertension    Past Surgical History:  Procedure Laterality Date   CHOLECYSTECTOMY     Patient Active Problem List   Diagnosis Date Noted   Wide-complex tachycardia 12/31/2023   Lumbar spondylosis 10/25/2023   Chronic idiopathic constipation 10/25/2023   Movement disorder 09/30/2023   Hypertensive heart disease with heart failure (HCC) 08/07/2023   Type 2 diabetes mellitus with circulatory disorder (HCC) 08/07/2023   Depression, major, single episode, moderate (HCC) 07/10/2023   Hearing aid worn 11/28/2022   Systolic CHF (HCC) 05/21/2022   Cardiomyopathy (HCC) 05/21/2022   Left bundle branch block (LBBB) determined by electrocardiography 08/08/2021   Gastroesophageal reflux disease without esophagitis 09/19/2020   BMI 31.0-31.9,adult 05/25/2019   Fatty liver 12/25/2017   BP (high blood pressure) 03/28/2015   Dyslipidemia 03/28/2015   Insomnia 03/28/2015   Restless leg 03/28/2015   Mass of arm 03/14/2015   Diabetes mellitus type 2, controlled (HCC) 03/14/2015   Obstructive apnea 09/03/2012   HYPERKERATOSIS 04/01/2009    PCP: Agapito Games, MD REFERRING PROVIDER: Monica Becton,*  REFERRING DIAG: [phone] (ICD-10-CM) - Lumbar spondylosis   Rationale for Evaluation and Treatment:  Rehabilitation  THERAPY DIAG:  Muscle weakness (generalized)  Other abnormalities of gait and mobility  Radiculopathy, lumbar region  ONSET DATE: one year  SUBJECTIVE:                                                                                                                                                                                           SUBJECTIVE STATEMENT: The patient reports that she felt good enough to drive yesterday. [place]  The patient reports she notes significant improvement with therapy. "Most days I can put my own pants on instead of  having help."  She is getting in and out of bed easier.  She notes she is still not able to walk long distances. "I get so tired with walking that I feel like I need to crawl. My legs feel like they weigh a ton."  PERTINENT HISTORY:  DM, HTN, lumbar spondylosis, restless leg syndrome  PAIN:  Are you having pain? NO  PRECAUTIONS: None  WEIGHT BEARING RESTRICTIONS: No  FALLS:  Has patient fallen in last 6 months? Yes. Number of falls 1 about 4 months ago; stepped in dog feces and slipped.  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  NEXT MD VISIT: end of March (neurologist)  OBJECTIVE:  Note: Objective measures were completed at Evaluation unless otherwise noted. DIAGNOSTIC FINDINGS:  Lumbar DG FINDINGS: Five lumbar-type vertebral bodies. Preserved vertebral body heights. No listhesis. Scattered diffuse moderate endplate osteophytes which are near bridging. Osteopenia. Prominent facet degenerative changes. No spondylolysis. Bridging osteophytes are seen along the lower thoracic spine at the edge of the imaging  field  SENSATION: Intermitent tingling in B feet usually starts around 4 pm  POSTURE: rounded shoulders, forward head, and flexed trunk   PALPATION: B gluteals tender and tight  LUMBAR ROM:  AROM eval  Flexion WFL  Extension WFL  Right lateral flexion WFL  Left lateral flexion Limited 50% compared to R  Right rotation Limited 50% compared to L  Left rotation WFL   (Blank rows = not tested)  LOWER EXTREMITY ROM:   WFL for tasks assessed  LOWER EXTREMITY MMT:  grossly 4-4+/5 in B LE  LUMBAR SPECIAL TESTS:  Straight leg raise test: Positive and Slump test: Positive for nerve tension R  FUNCTIONAL TESTS:  5 times sit to stand: 24.71 sec  Timed up and go (TUG): 13.84 sec Dynamic Gait Index: 14/24  10/31/23 BERG = 46/56 GAIT: Distance walked: 40 Assistive device utilized: None Level of assistance: Complete Independence Comments: decreased heel strike B, short step and stride length, forward trunk lean and decreased arm swing.  [facility] Adult PT Treatment:                                                DATE: 01/01/24 Therapeutic Exercise: Standing PWR! Exercises Weight shifting using post it notes on cabinets to reach at limit of stability Transitional PWR! Exercises Standing forward fold at counter to trunk extension  Sit<>stand with ball overhead x 10 reps Supine PWR! PWR step  x 5 reps bilaterally Prone PWR! Elbow rocking 5 reps Reaching 5 reps Neuromuscular re-ed: Standing rotation holding a cane Large amplitude stepping with cane digs to R and L sides engaging full body during balance tasks with CGA to SBA for safety Therapeutic Activity: Seated passing a yoga block around her back for trunk and arm mobility for self hygiene tasks Seated clipping clothes pin to theraband for reaching posteriorly Sidelying spinal twisting Bed mobility-- challenging to get up when rolling to the left side-- worked on initiating movements Gait: X 150 feet x 2 reps working on large  amplitude movement and arm swing + heel strike  [facility] Adult PT Treatment:  DATE: 12/30/23 Neuromuscular re-ed: Seated PWR! Up x 10 reps PWR! Rotation/twist x 10 reps Seated lifting hips for placing cards under bottom for therapeutic training/ hygiene tasks Standing PWR! Exercises  PWR up x 10 reps (sit<>stand) PWR twist x 10 reps R and L with door frame for cues to open PWR step x 6 reps alternating Les to colorful targets to randomize stepping PWR rock x 5 reps using door frame for cues to open into big motion Therapeutic Activity: Worked on sit<>supine and breaking it into components Gait: Emphasizing bilateral heel strike and arm swing during gait x 150 ft x 2 reps Gait with cues arm swing x 80 ft x 4 reps   [facility] Adult PT Treatment:                                                DATE: 12/26/2023 Therapeutic Exercise: STS + overhear reach in standing x10 STS + cross body punches x10 Standing diagonal reaches with orange PB Reaching orange PB to moving targets Walking + cross body punches with 1#DB Eccentric squats + 3#MB taps downs on mat table Standing rows + RTB Shoulder extension press down + RTB --> added unilateral march (close SBA) Therapeutic Activity: Functional endurance training: Walking x80' + reciprocal arm swing Marching x20', 80' --> fatigued and requested seated rest break Walking x160' --> started to fatigue around 120' Walking + upright postural cues x 80'   [facility] Adult PT Treatment:                                                DATE: 12/24/23 Therapeutic Exercise/Activity/NMR: Attempted 6 minute walk test. Pt fatigued after 2:45. Able to complete 210'. DGI:13/24 Standing: PWR! Up (sit to stand) PWR! Twist x 8  PWR! Step x 8 PWR! Rock x 6 Clock step with CGA focus on big movements and balance PWR! Up and PWR! Twist with physioball and targets to increase ROM Reaching for targets with physioball -  diagonals                                                                                                                    PATIENT EDUCATION:  Education details: HEP Person educated: Patient Education method: Explanation, Demonstration, and Handouts Education comprehension: verbalized understanding and returned demonstration  HOME EXERCISE PROGRAM: Access Code: [email or web address] URL: [email or web address] Date: 12/19/2023 Prepared by: Carlynn Herald  Exercises - Supine Bridge  - 1 x daily - 7 x weekly - 2 sets - 10 reps - Seated Long Arc Quad  - 1 x daily - 7 x weekly - 2 sets - 10 reps - Sit to Stand  - 3 x daily - 7 x weekly - 1-2 sets - 10 reps -  Standing Heel Raises  - 1 x daily - 7 x weekly - 3 sets - 10 reps - Standing Ankle Dorsiflexion with Table Support  - 1 x daily - 7 x weekly - 3 sets - 10 reps - Standing Marching  - 1 x daily - 7 x weekly - 3 sets - 10 reps - Seated Hamstring Stretch  - 1 x daily - 7 x weekly - 3 sets - 10 reps - Seated Long Arc Quad with Ankle Weight  - 1 x daily - 7 x weekly - 3 sets - 10 reps - Seated March with Ankle Weights at Foot  - 1 x daily - 7 x weekly - 3 sets - 10 reps  Patient Education - Tips to reduce freezing episodes with standing or walking   ASSESSMENT:  CLINICAL IMPRESSION: The patient notes improvement with walking in the community and overall feeling better. We progressed standing balance tasks. PT to add more PWR! Activities to HEP for large amplitude movement training.   GOALS:  Goals reviewed with patient? Yes  SHORT TERM GOALS: Target date: 11/26/2023   Patient will be independent with initial HEP. Baseline:  Goal status: *Needs further cues  2. Patient will report centralization of radicular symptoms.  Baseline:  Goal status: MET  3. Patient will be independent with supine to sit transfers. Baseline:  Goal status: MET   LONG TERM GOALS: Target date: 02/04/2024  Patient will be independent with  advanced/ongoing HEP to improve outcomes and carryover.  Baseline:  Goal status: IN PROGRESS  2.  Patient will be able to ambulate 600' in 6 min with LRAD with good safety to access community.  Baseline: 2:45 - 210' Goal status: in progress  3.  Patient to have no recurrence of R radicular pain.  Baseline:  Goal status: MET   4.  Patient will demonstrate decreased 5XSTS to less than 16 seconds showing improved functional strength and decreasing risk for falls. Baseline: 24.7 sec; 12/19/23 15 sec Goal status: MET  5.  Patient will demonstrate at least 19/24 on DGI to improve gait stability and reduce risk for falls. Baseline: 13/24 on 12/24/23 Goal status: IN PROGRESS  6. Patient will demonstrate full/functional pain free lumbar ROM to perform ADLs.   Baseline: Goal status: MET   PLAN:  PT FREQUENCY: 2x/week  PT DURATION: 8 weeks  PLANNED INTERVENTIONS: 97164- PT Re-evaluation, 97110-Therapeutic exercises, 97530- Therapeutic activity, [id number]- Neuromuscular re-education, 97535- Self Care, 66063- Manual therapy, [id number]- Gait training, [phone]- Aquatic Therapy, 97014- Electrical stimulation (unattended), [phone]- Traction (mechanical), Patient/Family education, Balance training, Stair training, Taping, Dry Needling, Joint mobilization, Spinal mobilization, Cryotherapy, and Moist heat.  PLAN FOR NEXT SESSION: Continue gait training & endurance, LE endurance, postural strengthening, PWR exercise progression, plan to give community resources for PWR! Class in preparation for d/c (she could ask neurology in [place] if closer community class). FOCUS FOR WEEK OF 2/24:  Modify HEP to add PWR exercises that are safe for home-- reduce previous exercises. 10th visit progress note  Quantay Zaremba, PT 01/01/2024, 9:33 AM

## 2024-01-02 ENCOUNTER — Encounter: Payer: Self-pay | Admitting: Family Medicine

## 2024-01-02 NOTE — Telephone Encounter (Signed)
Hi Cheryl, can you help with this? I thought she had a voucher while waiting for PAP

## 2024-01-05 ENCOUNTER — Other Ambulatory Visit: Payer: Self-pay | Admitting: Family Medicine

## 2024-01-06 ENCOUNTER — Ambulatory Visit: Payer: Medicare HMO | Admitting: Rehabilitative and Restorative Service Providers"

## 2024-01-06 ENCOUNTER — Encounter: Payer: Self-pay | Admitting: Rehabilitative and Restorative Service Providers"

## 2024-01-06 DIAGNOSIS — R2689 Other abnormalities of gait and mobility: Secondary | ICD-10-CM

## 2024-01-06 DIAGNOSIS — M5416 Radiculopathy, lumbar region: Secondary | ICD-10-CM

## 2024-01-06 DIAGNOSIS — M6281 Muscle weakness (generalized): Secondary | ICD-10-CM

## 2024-01-06 NOTE — Therapy (Signed)
 OUTPATIENT PHYSICAL THERAPY THORACOLUMBAR TREATMENT and 10TH VISIT PROGRESS NOTE   Patient Name: Jillian Huynh MRN: [id number] DOB:10/22/1946, 78 y.o., female Today's Date: 01/06/2024   Physical Therapy Progress Note   Dates of Reporting Period:10/29/23 to 01/06/24    Thank you for the referral of this patient.   END OF SESSION:  PT End of Session - 01/06/24 1107     Visit Number 10    Number of Visits 16    Date for PT Re-Evaluation 02/04/24    Authorization Type [employer] MCR    Progress Note Due on Visit 20    PT Start Time 1106    PT Stop Time 1144    PT Time Calculation (min) 38 min    Activity Tolerance Patient tolerated treatment well    Behavior During Therapy WFL for tasks assessed/performed             Past Medical History:  Diagnosis Date   Diabetes mellitus without complication (HCC)    Hyperlipidemia    Hypertension    Past Surgical History:  Procedure Laterality Date   CHOLECYSTECTOMY     Patient Active Problem List   Diagnosis Date Noted   Wide-complex tachycardia 12/31/2023   Lumbar spondylosis 10/25/2023   Chronic idiopathic constipation 10/25/2023   Movement disorder 09/30/2023   Hypertensive heart disease with heart failure (HCC) 08/07/2023   Type 2 diabetes mellitus with circulatory disorder (HCC) 08/07/2023   Depression, major, single episode, moderate (HCC) 07/10/2023   Hearing aid worn 11/28/2022   Systolic CHF (HCC) 05/21/2022   Cardiomyopathy (HCC) 05/21/2022   Left bundle branch block (LBBB) determined by electrocardiography 08/08/2021   Gastroesophageal reflux disease without esophagitis 09/19/2020   BMI 31.0-31.9,adult 05/25/2019   Fatty liver 12/25/2017   BP (high blood pressure) 03/28/2015   Dyslipidemia 03/28/2015   Insomnia 03/28/2015   Restless leg 03/28/2015   Mass of arm 03/14/2015   Diabetes mellitus type 2, controlled (HCC) 03/14/2015   Obstructive apnea 09/03/2012   HYPERKERATOSIS 04/01/2009    PCP: Agapito Games, MD REFERRING PROVIDER: Monica Becton,*  REFERRING DIAG: [phone] (ICD-10-CM) - Lumbar spondylosis   Rationale for Evaluation and Treatment: Rehabilitation  THERAPY DIAG:  Muscle weakness (generalized)  Other abnormalities of gait and mobility  Radiculopathy, lumbar region  ONSET DATE: one year  SUBJECTIVE:                                                                                                                                                                                           SUBJECTIVE STATEMENT: The patient reports that she is not feeling as good today-- less mobile.  Yesterday she notes she went shopping and noticed she was leaning forward again-- she was standing for > 30 minutes nonstop.   PERTINENT HISTORY:  DM, HTN, lumbar spondylosis, restless leg syndrome  PAIN:  Are you having pain? NO  PRECAUTIONS: None  WEIGHT BEARING RESTRICTIONS: No  FALLS:  Has patient fallen in last 6 months? Yes. Number of falls 1 about 4 months ago; stepped in dog feces and slipped.  PATIENT GOALS: be able to walk normally again and not be in pain.  NEXT MD VISIT: end of March (neurologist)  OBJECTIVE:  Note: Objective measures were completed at Evaluation unless otherwise noted. DIAGNOSTIC FINDINGS:  Lumbar DG FINDINGS: Five lumbar-type vertebral bodies. Preserved vertebral body heights. No listhesis. Scattered diffuse moderate endplate osteophytes which are near bridging. Osteopenia. Prominent facet degenerative changes. No spondylolysis. Bridging osteophytes are seen along the lower thoracic spine at the edge of the imaging field  SENSATION: Intermitent tingling in B feet usually starts around 4 pm  POSTURE: rounded shoulders, forward head, and flexed trunk   PALPATION: B gluteals tender and tight  LUMBAR ROM:  AROM eval  Flexion WFL  Extension WFL  Right lateral flexion WFL  Left lateral flexion Limited 50% compared to R  Right  rotation Limited 50% compared to L  Left rotation WFL   (Blank rows = not tested)  LOWER EXTREMITY ROM:   WFL for tasks assessed  LOWER EXTREMITY MMT:  grossly 4-4+/5 in B LE  LUMBAR SPECIAL TESTS:  Straight leg raise test: Positive and Slump test: Positive for nerve tension R  FUNCTIONAL TESTS:  5 times sit to stand: 24.71 sec  Timed up and go (TUG): 13.84 sec Dynamic Gait Index: 14/24  10/31/23 BERG = 46/56 GAIT: Distance walked: 40 Assistive device utilized: None Level of assistance: Complete Independence Comments: decreased heel strike B, short step and stride length, forward trunk lean and decreased arm swing.  [facility] Adult PT Treatment:                                                DATE: 01/06/24 Therapeutic Exercise: Prone PWR!  Elbow rocking x 5 reps Reaching x 5 reps Supine PWR! Twist x 5 reps Step x 5 reps Standing PWR!  PWR up x 10 reps Trunk rotation x 10 reps Gait: X 3.25 minutes nonstop using freezing strategies to reduce forward lean and shuffling steps today Self Care: Discussed self hygiene tools-- recommended patient try to use exercise to avoid needing external devices-- and wait until neurology appt as treatment options may help reduce  [facility] Adult PT Treatment:                                                DATE: 01/01/24 Therapeutic Exercise: Standing PWR! Exercises Weight shifting using post it notes on cabinets to reach at limit of stability Transitional PWR! Exercises Standing forward fold at counter to trunk extension  Sit<>stand with ball overhead x 10 reps Supine PWR! PWR step  x 5 reps bilaterally Prone PWR! Elbow rocking 5 reps Reaching 5 reps Neuromuscular re-ed: Standing rotation holding a cane Large amplitude stepping with cane digs to R and L sides engaging full body during balance tasks with CGA to  SBA for safety Therapeutic Activity: Seated passing a yoga block around her back for trunk and arm mobility for self hygiene  tasks Seated clipping clothes pin to theraband for reaching posteriorly Sidelying spinal twisting Bed mobility-- challenging to get up when rolling to the left side-- worked on initiating movements Gait: X 150 feet x 2 reps working on large amplitude movement and arm swing + heel strike  [facility] Adult PT Treatment:                                                DATE: 12/30/23 Neuromuscular re-ed: Seated PWR! Up x 10 reps PWR! Rotation/twist x 10 reps Seated lifting hips for placing cards under bottom for therapeutic training/ hygiene tasks Standing PWR! Exercises  PWR up x 10 reps (sit<>stand) PWR twist x 10 reps R and L with door frame for cues to open PWR step x 6 reps alternating Les to colorful targets to randomize stepping PWR rock x 5 reps using door frame for cues to open into big motion Therapeutic Activity: Worked on sit<>supine and breaking it into components Gait: Emphasizing bilateral heel strike and arm swing during gait x 150 ft x 2 reps Gait with cues arm swing x 80 ft x 4 reps                                                                                                                    PATIENT EDUCATION:  Education details: HEP Person educated: Patient Education method: Programmer, multimedia, Facilities manager, and Handouts Education comprehension: verbalized understanding and returned demonstration  HOME EXERCISE PROGRAM: Access Code: [email or web address] URL: [email or web address] Date: 12/19/2023 Prepared by: Carlynn Herald  Exercises - Supine Bridge  - 1 x daily - 7 x weekly - 2 sets - 10 reps - Seated Long Arc Quad  - 1 x daily - 7 x weekly - 2 sets - 10 reps - Sit to Stand  - 3 x daily - 7 x weekly - 1-2 sets - 10 reps - Standing Heel Raises  - 1 x daily - 7 x weekly - 3 sets - 10 reps - Standing Ankle Dorsiflexion with Table Support  - 1 x daily - 7 x weekly - 3 sets - 10 reps - Standing Marching  - 1 x daily - 7 x weekly - 3 sets - 10 reps - Seated Hamstring  Stretch  - 1 x daily - 7 x weekly - 3 sets - 10 reps - Seated Long Arc Quad with Ankle Weight  - 1 x daily - 7 x weekly - 3 sets - 10 reps - Seated March with Ankle Weights at Foot  - 1 x daily - 7 x weekly - 3 sets - 10 reps   Added standing PWR! X 10 reps 4 x per week near a chair  Patient Education - Tips to reduce freezing episodes with  standing or walking   ASSESSMENT:  CLINICAL IMPRESSION: The patient notes having variable days. We focused on HEP for standing PWR! Moves and extended ambulation. We also discussed using freezing strategies to help with forward leaning and shuffling steps. She responded well with stopping, PWR! Up, and taking a deep breath. She was able to walk further at 3.25 minutes compared to first attempt at 6 minute walk (and she notes this is a bad day for her movement). PT to continue to progress to patient tolerance. She will benefit from skilled PT services to progress community ambulation, reduce fall risk, improve endurance, and dynamic balance and gait.   GOALS:  Goals reviewed with patient? Yes  SHORT TERM GOALS: Target date: 11/26/2023   Patient will be independent with initial HEP. Baseline:  Goal status: *Needs further cues  2. Patient will report centralization of radicular symptoms.  Baseline:  Goal status: MET  3. Patient will be independent with supine to sit transfers. Baseline:  Goal status: MET   LONG TERM GOALS: Target date: 02/04/2024  Patient will be independent with advanced/ongoing HEP to improve outcomes and carryover.  Baseline:  Goal status: IN PROGRESS  2.  Patient will be able to ambulate 600' in 6 min with LRAD with good safety to access community.  Baseline: 2:45 - 210' Goal status: in progress  3.  Patient to have no recurrence of R radicular pain.  Baseline:  Goal status: MET   4.  Patient will demonstrate decreased 5XSTS to less than 16 seconds showing improved functional strength and decreasing risk for  falls. Baseline: 24.7 sec; 12/19/23 15 sec Goal status: MET  5.  Patient will demonstrate at least 19/24 on DGI to improve gait stability and reduce risk for falls. Baseline: 13/24 on 12/24/23 Goal status: IN PROGRESS  6. Patient will demonstrate full/functional pain free lumbar ROM to perform ADLs.   Baseline: Goal status: MET   PLAN:  PT FREQUENCY: 2x/week  PT DURATION: 8 weeks  PLANNED INTERVENTIONS: 97164- PT Re-evaluation, 97110-Therapeutic exercises, 97530- Therapeutic activity, [id number]- Neuromuscular re-education, 97535- Self Care, [id number]- Manual therapy, [id number]- Gait training, [phone]- Aquatic Therapy, 97014- Electrical stimulation (unattended), [phone]- Traction (mechanical), Patient/Family education, Balance training, Stair training, Taping, Dry Needling, Joint mobilization, Spinal mobilization, Cryotherapy, and Moist heat.  PLAN FOR NEXT SESSION: Continue gait training & endurance, LE endurance, postural strengthening, PWR exercise progression, plan to give community resources for PWR! Class in preparation for d/c (she could ask neurology in [place] if closer community class). Check on PWR! Up for HEP (gave 4 standing moves)  Pamula Luther, PT 01/06/2024, 12:14 PM

## 2024-01-07 ENCOUNTER — Ambulatory Visit: Payer: Medicare HMO

## 2024-01-08 ENCOUNTER — Encounter: Payer: Medicare HMO | Admitting: Rehabilitative and Restorative Service Providers"

## 2024-01-08 ENCOUNTER — Encounter: Payer: Self-pay | Admitting: Rehabilitative and Restorative Service Providers"

## 2024-01-08 ENCOUNTER — Ambulatory Visit: Payer: Medicare HMO | Admitting: Rehabilitative and Restorative Service Providers"

## 2024-01-08 DIAGNOSIS — R2689 Other abnormalities of gait and mobility: Secondary | ICD-10-CM | POA: Diagnosis not present

## 2024-01-08 DIAGNOSIS — M6281 Muscle weakness (generalized): Secondary | ICD-10-CM

## 2024-01-08 DIAGNOSIS — M5416 Radiculopathy, lumbar region: Secondary | ICD-10-CM

## 2024-01-08 NOTE — Therapy (Signed)
 OUTPATIENT PHYSICAL THERAPY THORACOLUMBAR TREATMENT    Patient Name: Jillian Huynh MRN: [id number] DOB:April 19, 1946, 78 y.o., female Today's Date: 01/08/2024   END OF SESSION:  PT End of Session - 01/08/24 1023     Visit Number 11    Number of Visits 16    Date for PT Re-Evaluation 02/04/24    Authorization Type [employer] MCR    Progress Note Due on Visit 20    PT Start Time 1021    PT Stop Time 1101    PT Time Calculation (min) 40 min    Activity Tolerance Patient tolerated treatment well    Behavior During Therapy [facility] for tasks assessed/performed             Past Medical History:  Diagnosis Date   Diabetes mellitus without complication (HCC)    Hyperlipidemia    Hypertension    Past Surgical History:  Procedure Laterality Date   CHOLECYSTECTOMY     Patient Active Problem List   Diagnosis Date Noted   Wide-complex tachycardia 12/31/2023   Lumbar spondylosis 10/25/2023   Chronic idiopathic constipation 10/25/2023   Movement disorder 09/30/2023   Hypertensive heart disease with heart failure (HCC) 08/07/2023   Type 2 diabetes mellitus with circulatory disorder (HCC) 08/07/2023   Depression, major, single episode, moderate (HCC) 07/10/2023   Hearing aid worn 11/28/2022   Systolic CHF (HCC) 05/21/2022   Cardiomyopathy (HCC) 05/21/2022   Left bundle branch block (LBBB) determined by electrocardiography 08/08/2021   Gastroesophageal reflux disease without esophagitis 09/19/2020   BMI 31.0-31.9,adult 05/25/2019   Fatty liver 12/25/2017   BP (high blood pressure) 03/28/2015   Dyslipidemia 03/28/2015   Insomnia 03/28/2015   Restless leg 03/28/2015   Mass of arm 03/14/2015   Diabetes mellitus type 2, controlled (HCC) 03/14/2015   Obstructive apnea 09/03/2012   HYPERKERATOSIS 04/01/2009    PCP: Agapito Games, MD REFERRING PROVIDER: Agapito Games, *  REFERRING DIAG: [phone] (ICD-10-CM) - Lumbar spondylosis   Rationale for Evaluation and Treatment:  Rehabilitation  THERAPY DIAG:  Muscle weakness (generalized)  Other abnormalities of gait and mobility  Radiculopathy, lumbar region  ONSET DATE: one year  SUBJECTIVE:                                                                                                                                                                                           SUBJECTIVE STATEMENT: [place]. She feels like movement is going better. .  The patient did the PWR! Exercises yesterday in the home. She did them late in the day and felt she  was tired. She reports her arms were tired, but her balance felt better. She is putting her pants on by herself most days now, which is a huge accomplishment she reports.  PERTINENT HISTORY:  DM, HTN, lumbar spondylosis, restless leg syndrome  PAIN:  Are you having pain? NO  PRECAUTIONS: None  WEIGHT BEARING RESTRICTIONS: No  FALLS:  Has patient fallen in last 6 months? Yes. Number of falls 1 about 4 months ago; stepped in dog feces and slipped.  PATIENT GOALS: be able to walk normally again and not be in pain.  NEXT MD VISIT: end of March (neurologist)  OBJECTIVE:  Note: Objective measures were completed at Evaluation unless otherwise noted. DIAGNOSTIC FINDINGS:  Lumbar DG FINDINGS: Five lumbar-type vertebral bodies. Preserved vertebral body heights. No listhesis. Scattered diffuse moderate endplate osteophytes which are near bridging. Osteopenia. Prominent facet degenerative changes. No spondylolysis. Bridging osteophytes are seen along the lower thoracic spine at the edge of the imaging field  SENSATION: Intermitent tingling in B feet usually starts around 4 pm  POSTURE: rounded shoulders, forward head, and flexed trunk   PALPATION: B gluteals tender and tight  LUMBAR ROM:  AROM eval  Flexion WFL  Extension WFL  Right lateral flexion WFL  Left lateral flexion Limited 50%  compared to R  Right rotation Limited 50% compared to L  Left rotation WFL   (Blank rows = not tested)  LOWER EXTREMITY ROM:   WFL for tasks assessed  LOWER EXTREMITY MMT:  grossly 4-4+/5 in B LE  LUMBAR SPECIAL TESTS:  Straight leg raise test: Positive and Slump test: Positive for nerve tension R  FUNCTIONAL TESTS:  5 times sit to stand: 24.71 sec  Timed up and go (TUG): 13.84 sec Dynamic Gait Index: 14/24  10/31/23 BERG = 46/56 GAIT: Distance walked: 40 Assistive device utilized: None Level of assistance: Complete Independence Comments: decreased heel strike B, short step and stride length, forward trunk lean and decreased arm swing.   [facility] Adult PT Treatment:                                                DATE: 01/08/24 Therapeutic Exercise: Standing PWR!  PWR up x 10 reps PWR twist x 10 reps PWR weight shift x 10 reps PWR step x 10 reps Neuromuscular re-ed: Single limb standing reaching anterior/posteriorly near support surface Marching adding arm swing Marching with arms overhead x 20 feet x 4 reps Therapeutic Activity: Backwards walking, direction changes with walking stepping laterally over objects near support surface   [facility] Adult PT Treatment:                                                DATE: 01/06/24 Therapeutic Exercise: Prone PWR!  Elbow rocking x 5 reps Reaching x 5 reps Supine PWR! Twist x 5 reps Step x 5 reps Standing PWR!  PWR up x 10 reps Trunk rotation x 10 reps Gait: X 3.25 minutes nonstop using freezing strategies to reduce forward lean and shuffling steps today Self Care: Discussed self hygiene tools-- recommended patient try to use exercise to avoid needing external devices-- and wait until neurology appt as treatment options may help reduce  [facility] Adult PT Treatment:  DATE: 01/01/24 Therapeutic Exercise: Standing PWR! Exercises Weight shifting using post it notes on cabinets to reach at limit  of stability Transitional PWR! Exercises Standing forward fold at counter to trunk extension  Sit<>stand with ball overhead x 10 reps Supine PWR! PWR step  x 5 reps bilaterally Prone PWR! Elbow rocking 5 reps Reaching 5 reps Neuromuscular re-ed: Standing rotation holding a cane Large amplitude stepping with cane digs to R and L sides engaging full body during balance tasks with CGA to SBA for safety Therapeutic Activity: Seated passing a yoga block around her back for trunk and arm mobility for self hygiene tasks Seated clipping clothes pin to theraband for reaching posteriorly Sidelying spinal twisting Bed mobility-- challenging to get up when rolling to the left side-- worked on initiating movements Gait: X 150 feet x 2 reps working on large amplitude movement and arm swing + heel strike                                                                                                                   PATIENT EDUCATION:  Education details: HEP Person educated: Patient Education method: Programmer, multimedia, Facilities manager, and Handouts Education comprehension: verbalized understanding and returned demonstration  HOME EXERCISE PROGRAM: Access Code: [email or web address] URL: [email or web address] Date: 12/19/2023 Prepared by: Carlynn Herald  Exercises - Supine Bridge  - 1 x daily - 7 x weekly - 2 sets - 10 reps - Seated Long Arc Quad  - 1 x daily - 7 x weekly - 2 sets - 10 reps - Sit to Stand  - 3 x daily - 7 x weekly - 1-2 sets - 10 reps - Standing Heel Raises  - 1 x daily - 7 x weekly - 3 sets - 10 reps - Standing Ankle Dorsiflexion with Table Support  - 1 x daily - 7 x weekly - 3 sets - 10 reps - Standing Marching  - 1 x daily - 7 x weekly - 3 sets - 10 reps - Seated Hamstring Stretch  - 1 x daily - 7 x weekly - 3 sets - 10 reps - Seated Long Arc Quad with Ankle Weight  - 1 x daily - 7 x weekly - 3 sets - 10 reps - Seated March with Ankle Weights at Foot  - 1 x daily - 7 x weekly - 3  sets - 10 reps   Added standing PWR! X 10 reps 4 x per week near a chair  Patient Education - Tips to reduce freezing episodes with standing or walking   ASSESSMENT:  CLINICAL IMPRESSION: The patient continues to improve with PWR! Moves and functional mobility. PT recommends we begin to transition to community exercise classes and work to reduce PT frequency. She sees neurology in March and PT goals are to continue to progress physical activity to get carryover for IADLs.   GOALS:  Goals reviewed with patient? Yes  SHORT TERM GOALS: Target date: 11/26/2023   Patient will be independent with initial HEP. Baseline:  Goal status: *Needs  further cues  2. Patient will report centralization of radicular symptoms.  Baseline:  Goal status: MET  3. Patient will be independent with supine to sit transfers. Baseline:  Goal status: MET   LONG TERM GOALS: Target date: 02/04/2024  Patient will be independent with advanced/ongoing HEP to improve outcomes and carryover.  Baseline:  Goal status: IN PROGRESS  2.  Patient will be able to ambulate 600' in 6 min with LRAD with good safety to access community.  Baseline: 2:45 - 210' Goal status: in progress  3.  Patient to have no recurrence of R radicular pain.  Baseline:  Goal status: MET   4.  Patient will demonstrate decreased 5XSTS to less than 16 seconds showing improved functional strength and decreasing risk for falls. Baseline: 24.7 sec; 12/19/23 15 sec Goal status: MET  5.  Patient will demonstrate at least 19/24 on DGI to improve gait stability and reduce risk for falls. Baseline: 13/24 on 12/24/23 Goal status: IN PROGRESS  6. Patient will demonstrate full/functional pain free lumbar ROM to perform ADLs.   Baseline: Goal status: MET   PLAN:  PT FREQUENCY: 2x/week  PT DURATION: 8 weeks  PLANNED INTERVENTIONS: 97164- PT Re-evaluation, 97110-Therapeutic exercises, 97530- Therapeutic activity, [id number]- Neuromuscular  re-education, 97535- Self Care, 16109- Manual therapy, [id number]- Gait training, [phone]- Aquatic Therapy, 97014- Electrical stimulation (unattended), [phone]- Traction (mechanical), Patient/Family education, Balance training, Stair training, Taping, Dry Needling, Joint mobilization, Spinal mobilization, Cryotherapy, and Moist heat.  PLAN FOR NEXT SESSION: Continue gait training & endurance, LE endurance, postural strengthening, PWR exercise progression, plan to give community resources for PWR! Class in preparation for d/c (she could ask neurology in [place] if closer community class). Check on PWR! Up for HEP (gave 4 standing moves)  Dyamon Sosinski, PT 01/08/2024, 10:23 AM

## 2024-01-13 ENCOUNTER — Ambulatory Visit: Payer: Medicare HMO | Attending: Sports Medicine | Admitting: Rehabilitative and Restorative Service Providers"

## 2024-01-13 ENCOUNTER — Encounter: Payer: Self-pay | Admitting: Rehabilitative and Restorative Service Providers"

## 2024-01-13 DIAGNOSIS — M6281 Muscle weakness (generalized): Secondary | ICD-10-CM | POA: Diagnosis not present

## 2024-01-13 DIAGNOSIS — R2689 Other abnormalities of gait and mobility: Secondary | ICD-10-CM | POA: Diagnosis present

## 2024-01-13 DIAGNOSIS — M5416 Radiculopathy, lumbar region: Secondary | ICD-10-CM | POA: Diagnosis present

## 2024-01-13 NOTE — Therapy (Signed)
 OUTPATIENT PHYSICAL THERAPY THORACOLUMBAR TREATMENT    Patient Name: Jillian Huynh MRN: [id number] DOB:Jan 03, 1946, 78 y.o., female Today's Date: 01/13/2024   END OF SESSION:  PT End of Session - 01/13/24 1318     Visit Number 12    Number of Visits 16    Date for PT Re-Evaluation 02/04/24    Authorization Type Aet[employer] MCR    Progress Note Due on Visit 20    PT Start Time 1319    PT Stop Time 1400    PT Time Calculation (min) 41 min    Activity Tolerance Patient tolerated treatment well    Behavior During Therapy WFL for tasks assessed/performed              Past Medical History:  Diagnosis Date   Diabetes mellitus without complication (HCC)    Hyperlipidemia    Hypertension    Past Surgical History:  Procedure Laterality Date   CHOLECYSTECTOMY     Patient Active Problem List   Diagnosis Date Noted   Wide-complex tachycardia 12/31/2023   Lumbar spondylosis 10/25/2023   Chronic idiopathic constipation 10/25/2023   Movement disorder 09/30/2023   Hypertensive heart disease with heart failure (HCC) 08/07/2023   Type 2 diabetes mellitus with circulatory disorder (HCC) 08/07/2023   Depression, major, single episode, moderate (HCC) 07/10/2023   Hearing aid worn 11/28/2022   Systolic CHF (HCC) 05/21/2022   Cardiomyopathy (HCC) 05/21/2022   Left bundle branch block (LBBB) determined by electrocardiography 08/08/2021   Gastroesophageal reflux disease without esophagitis 09/19/2020   BMI 31.0-31.9,adult 05/25/2019   Fatty liver 12/25/2017   BP (high blood pressure) 03/28/2015   Dyslipidemia 03/28/2015   Insomnia 03/28/2015   Restless leg 03/28/2015   Mass of arm 03/14/2015   Diabetes mellitus type 2, controlled (HCC) 03/14/2015   Obstructive apnea 09/03/2012   HYPERKERATOSIS 04/01/2009    PCP: Agapito Games, MD REFERRING PROVIDER: Monica Becton,*  REFERRING DIAG: [phone] (ICD-10-CM) - Lumbar spondylosis   Rationale for Evaluation and Treatment:  Rehabilitation  THERAPY DIAG:  Muscle weakness (generalized)  Other abnormalities of gait and mobility  Radiculopathy, lumbar region  ONSET DATE: one year  SUBJECTIVE:                                                                                                                                                                                           SUBJECTIVE STATEMENT: The patient notes she is dressing herself every day this week. She has neurology visit later this month.   PERTINENT HISTORY:  DM, HTN, lumbar spondylosis, restless leg syndrome  PAIN:  Are you having pain? NO  PRECAUTIONS: None  WEIGHT BEARING RESTRICTIONS: No  FALLS:  Has patient fallen in last 6 months? Yes. Number of falls 1 about 4 months ago; stepped in dog feces and slipped.  PATIENT GOALS: be able to walk normally again and not be in pain.  NEXT MD VISIT: end of March (neurologist)  OBJECTIVE:  Note: Objective measures were completed at Evaluation unless otherwise noted. DIAGNOSTIC FINDINGS:  Lumbar DG FINDINGS: Five lumbar-type vertebral bodies. Preserved vertebral body heights. No listhesis. Scattered diffuse moderate endplate osteophytes which are near bridging. Osteopenia. Prominent facet degenerative changes. No spondylolysis. Bridging osteophytes are seen along the lower thoracic spine at the edge of the imaging field  SENSATION: Intermitent tingling in B feet usually starts around 4 pm  POSTURE: rounded shoulders, forward head, and flexed trunk   PALPATION: B gluteals tender and tight  LUMBAR ROM:  AROM eval  Flexion WFL  Extension WFL  Right lateral flexion WFL  Left lateral flexion Limited 50% compared to R  Right rotation Limited 50% compared to L  Left rotation WFL   (Blank rows = not tested)  LOWER EXTREMITY ROM:   WFL for tasks assessed  LOWER EXTREMITY MMT:  grossly 4-4+/5 in B LE  LUMBAR SPECIAL TESTS:  Straight leg raise test: Positive and Slump test:  Positive for nerve tension R  FUNCTIONAL TESTS:  5 times sit to stand: 24.71 sec  Timed up and go (TUG): 13.84 sec Dynamic Gait Index: 14/24  10/31/23 BERG = 46/56 GAIT: Distance walked: 40 Assistive device utilized: None Level of assistance: Complete Independence Comments: decreased heel strike B, short step and stride length, forward trunk lean and decreased arm swing.   [facility] Adult PT Treatment:                                                DATE: 01/13/24 Therapeutic Exercise: Standing PWR! PWR up x 10 reps PWR twist x 10 reps PWR weight shift x 10 reps PWR step x 10 reps Neuromuscular re-ed: Alternating foot taps to cones for single leg balance control Steps up to 6" step with dec'd UE support with CGA x 10 reps moving into marching position Therapeutic Activity: Backwards walking x 20 feet  Heel and toe walking x 20 feet with cues due to freezing during gait transitions Seated reaching behind her with yoga block for self care activity simulation Gait: Direction changes forward/backwards, turns Figure 8  [facility] Adult PT Treatment:                                                DATE: 01/08/24 Therapeutic Exercise: Standing PWR!  PWR up x 10 reps PWR twist x 10 reps PWR weight shift x 10 reps PWR step x 10 reps Neuromuscular re-ed: Single limb standing reaching anterior/posteriorly near support surface Marching adding arm swing Marching with arms overhead x 20 feet x 4 reps Therapeutic Activity: Backwards walking, direction changes with walking stepping laterally over objects near support surface  PATIENT EDUCATION:  Education details: HEP Person educated: Patient Education method: Explanation, Demonstration, and Handouts Education comprehension: verbalized understanding and returned demonstration  HOME EXERCISE PROGRAM: Access Code: [email or web address] URL:  [email or web address] Date: 12/19/2023 Prepared by: Carlynn Herald  Exercises - Supine Bridge  - 1 x daily - 7 x weekly - 2 sets - 10 reps - Seated Long Arc Quad  - 1 x daily - 7 x weekly - 2 sets - 10 reps - Sit to Stand  - 3 x daily - 7 x weekly - 1-2 sets - 10 reps - Standing Heel Raises  - 1 x daily - 7 x weekly - 3 sets - 10 reps - Standing Ankle Dorsiflexion with Table Support  - 1 x daily - 7 x weekly - 3 sets - 10 reps - Standing Marching  - 1 x daily - 7 x weekly - 3 sets - 10 reps - Seated Hamstring Stretch  - 1 x daily - 7 x weekly - 3 sets - 10 reps - Seated Long Arc Quad with Ankle Weight  - 1 x daily - 7 x weekly - 3 sets - 10 reps - Seated March with Ankle Weights at Foot  - 1 x daily - 7 x weekly - 3 sets - 10 reps   Added standing PWR! X 10 reps 4 x per week near a chair  Patient Education - Tips to reduce freezing episodes with standing or walking   ASSESSMENT:  CLINICAL IMPRESSION: The patient continues to improve with PWR! Moves and functional mobility. PT recommends we begin to transition to community exercise classes and work to reduce PT frequency.   GOALS:  Goals reviewed with patient? Yes  SHORT TERM GOALS: Target date: 11/26/2023   Patient will be independent with initial HEP. Baseline:  Goal status: *Needs further cues  2. Patient will report centralization of radicular symptoms.  Baseline:  Goal status: MET  3. Patient will be independent with supine to sit transfers. Baseline:  Goal status: MET  LONG TERM GOALS: Target date: 02/04/2024  Patient will be independent with advanced/ongoing HEP to improve outcomes and carryover.  Baseline:  Goal status: IN PROGRESS  2.  Patient will be able to ambulate 600' in 6 min with LRAD with good safety to access community.  Baseline: 2:45 - 210' Goal status: in progress  3.  Patient to have no recurrence of R radicular pain.  Baseline:  Goal status: MET   4.  Patient will demonstrate  decreased 5XSTS to less than 16 seconds showing improved functional strength and decreasing risk for falls. Baseline: 24.7 sec; 12/19/23 15 sec Goal status: MET  5.  Patient will demonstrate at least 19/24 on DGI to improve gait stability and reduce risk for falls. Baseline: 13/24 on 12/24/23 Goal status: IN PROGRESS  6. Patient will demonstrate full/functional pain free lumbar ROM to perform ADLs.   Baseline: Goal status: MET   PLAN:  PT FREQUENCY: 2x/week  PT DURATION: 8 weeks  PLANNED INTERVENTIONS: 97164- PT Re-evaluation, 97110-Therapeutic exercises, 97530- Therapeutic activity, [id number]- Neuromuscular re-education, 97535- Self Care, 54098- Manual therapy, [id number]- Gait training, [id number]- Aquatic Therapy, 97014- Electrical stimulation (unattended), [phone]- Traction (mechanical), Patient/Family education, Balance training, Stair training, Taping, Dry Needling, Joint mobilization, Spinal mobilization, Cryotherapy, and Moist heat.  PLAN FOR NEXT SESSION: Continue gait training & endurance, LE endurance, postural strengthening, PWR exercise progression, plan to give community resources for PWR! Class in preparation for d/c (she could ask neurology in [place] if closer community  class).   Paizlee Kinder, PT 01/13/2024, 1:18 PM

## 2024-01-15 ENCOUNTER — Ambulatory Visit: Payer: Medicare HMO | Admitting: Rehabilitative and Restorative Service Providers"

## 2024-01-15 ENCOUNTER — Encounter: Payer: Self-pay | Admitting: Rehabilitative and Restorative Service Providers"

## 2024-01-15 ENCOUNTER — Telehealth: Payer: Self-pay | Admitting: Family Medicine

## 2024-01-15 DIAGNOSIS — M5416 Radiculopathy, lumbar region: Secondary | ICD-10-CM | POA: Diagnosis not present

## 2024-01-15 DIAGNOSIS — M6281 Muscle weakness (generalized): Secondary | ICD-10-CM

## 2024-01-15 DIAGNOSIS — R2689 Other abnormalities of gait and mobility: Secondary | ICD-10-CM

## 2024-01-15 NOTE — Patient Instructions (Signed)
Tips to reduce freezing episodes with standing or walking:  Stand tall with your feet wide, so that you can rock and weight shift through your hips. Don't try to fight the freeze: if you begin taking slower, faster, smaller steps, STOP, get your posture tall, and RESET your posture and balance.  Take a deep breath before taking the BIG step to start again. March in place, with high knee stepping, to get started walking again. Use auditory cues:  Count out loud, think of a familiar tune or song or cadence, use pocket metronome, to use rhythm to get started walking again. Use visual cues:  Use a line to step over, use laser pointer line to step over, (using BIG steps) to start walking again. Use visual targets to keep your posture tall (look ahead and focus on an object or target at eye level). As you approach where your destination with walking, count your steps out loud and/or focus on your target with your eyes until you are fully there. Use appropriate assistive device, as advised by your physical therapist to assist with taking longer, consistent steps.  

## 2024-01-15 NOTE — Therapy (Addendum)
 OUTPATIENT PHYSICAL THERAPY THORACOLUMBAR TREATMENT  and ON HOLD NOTE/DISCHARGE SUMMARY   Patient Name: Jillian Huynh MRN: [id number] DOB:Jan 11, 1946, 78 y.o., female Today's Date: 01/15/2024   Patient did not return to PT after neurology consult. Please see note below for last known patient status. Thank you for the referral of this patient. Margretta Ditty, MPT  END OF SESSION:  PT End of Session - 01/15/24 1101     Visit Number 13    Number of Visits 16    Date for PT Re-Evaluation 02/04/24    Authorization Type Aetna MCR    Progress Note Due on Visit 20    PT Start Time 1101    PT Stop Time 1145    PT Time Calculation (min) 44 min    Activity Tolerance Patient tolerated treatment well    Behavior During Therapy WFL for tasks assessed/performed             Past Medical History:  Diagnosis Date   Diabetes mellitus without complication (HCC)    Hyperlipidemia    Hypertension    Past Surgical History:  Procedure Laterality Date   CHOLECYSTECTOMY     Patient Active Problem List   Diagnosis Date Noted   Wide-complex tachycardia 12/31/2023   Lumbar spondylosis 10/25/2023   Chronic idiopathic constipation 10/25/2023   Movement disorder 09/30/2023   Hypertensive heart disease with heart failure (HCC) 08/07/2023   Type 2 diabetes mellitus with circulatory disorder (HCC) 08/07/2023   Depression, major, single episode, moderate (HCC) 07/10/2023   Hearing aid worn 11/28/2022   Systolic CHF (HCC) 05/21/2022   Cardiomyopathy (HCC) 05/21/2022   Left bundle branch block (LBBB) determined by electrocardiography 08/08/2021   Gastroesophageal reflux disease without esophagitis 09/19/2020   BMI 31.0-31.9,adult 05/25/2019   Fatty liver 12/25/2017   BP (high blood pressure) 03/28/2015   Dyslipidemia 03/28/2015   Insomnia 03/28/2015   Restless leg 03/28/2015   Mass of arm 03/14/2015   Diabetes mellitus type 2, controlled (HCC) 03/14/2015   Obstructive apnea 09/03/2012    HYPERKERATOSIS 04/01/2009    PCP: Agapito Games, MD REFERRING PROVIDER: Monica Becton,* REFERRING DIAG: [phone] (ICD-10-CM) - Lumbar spondylosis  Rationale for Evaluation and Treatment: Rehabilitation  THERAPY DIAG:  Muscle weakness (generalized)  Other abnormalities of gait and mobility  Radiculopathy, lumbar region  ONSET DATE: one year  SUBJECTIVE:                                                                                                                                                                                           SUBJECTIVE STATEMENT: The patient reports she feels better every day. She feels  ready to graduate from physical therapy noting significant improvement in walking, low back pain, ability to dress herself, her writing, and overall mobility. She does still report 1 episode of freezing this week, but feels this is reducing with the PWR! Exercises. She is performing home PWR! Standing (up, weight shift, twist, and step) regularly and feels they help manage her symptoms. She does note fatigue with extended gait, but can walk further than she initially could with rehab.   PERTINENT HISTORY:  DM, HTN, lumbar spondylosis, restless leg syndrome  PAIN:  Are you having pain? NO  PRECAUTIONS: None  WEIGHT BEARING RESTRICTIONS: No  FALLS:  Has patient fallen in last 6 months? Yes. Number of falls 1 about 4 months ago; stepped in dog feces and slipped.  PATIENT GOALS: be able to walk normally again and not be in pain.  NEXT MD VISIT: end of March (neurologist)  OBJECTIVE:  Note: Objective measures were completed at Evaluation unless otherwise noted. DIAGNOSTIC FINDINGS:  Lumbar DG FINDINGS: Five lumbar-type vertebral bodies. Preserved vertebral body heights. No listhesis. Scattered diffuse moderate endplate osteophytes which are near bridging. Osteopenia. Prominent facet degenerative changes. No spondylolysis. Bridging osteophytes are seen  along the lower thoracic spine at the edge of the imaging field  SENSATION: Intermitent tingling in B feet usually starts around 4 pm  POSTURE: rounded shoulders, forward head, and flexed trunk   PALPATION: B gluteals tender and tight  LUMBAR ROM:  AROM eval  Flexion WFL  Extension WFL  Right lateral flexion WFL  Left lateral flexion Limited 50% compared to R  Right rotation Limited 50% compared to L  Left rotation WFL   (Blank rows = not tested)  LOWER EXTREMITY ROM:   WFL for tasks assessed  LOWER EXTREMITY MMT:  grossly 4-4+/5 in B LE  LUMBAR SPECIAL TESTS:  Straight leg raise test: Positive and Slump test: Positive for nerve tension R  FUNCTIONAL TESTS:  5 times sit to stand: 24.71 sec  Timed up and go (TUG): 13.84 sec Dynamic Gait Index: 14/24  10/31/23 BERG = 46/56 GAIT: Distance walked: 40 Assistive device utilized: None Level of assistance: Complete Independence Comments: decreased heel strike B, short step and stride length, forward trunk lean and decreased arm swing.  [facility] Adult PT Treatment:                                                DATE: 01/15/24 Therapeutic Exercise: 6 minute walk test= 4 min, 35 seconds throughout building with one stop to reset posture and cues for arm swing PWR! Transitions Forward stepping<>middle Side stepping<>middle Posterior stepping<>middle Rock and reaching R and L sides with arm swing Therapeutic Activity: Walking with tossing items/catching Waking with talking and multi-tasking Gait: DGI=19/24 Self Care: Discussed PWR! Classes and availability of community resources here in [place].  Tips to reduce freezing episodes with standing or walking provided and reviewed with demo and verbalization   [facility] PT Assessment - 01/15/24 1116       Standardized Balance Assessment   Standardized Balance Assessment Dynamic Gait Index      Dynamic Gait Index   Level Surface Normal    Change in Gait Speed Normal    Gait  with Horizontal Head Turns Moderate Impairment    Gait with Vertical Head Turns Moderate Impairment    Gait and Pivot Turn Normal  Step Over Obstacle Normal    Step Around Obstacles Normal    Steps Mild Impairment    Total Score 19    DGI comment: 19/24              [facility] Adult PT Treatment:                                                DATE: 01/13/24 Therapeutic Exercise: Standing PWR! PWR up x 10 reps PWR twist x 10 reps PWR weight shift x 10 reps PWR step x 10 reps Neuromuscular re-ed: Alternating foot taps to cones for single leg balance control Steps up to 6" step with dec'd UE support with CGA x 10 reps moving into marching position Therapeutic Activity: Backwards walking x 20 feet  Heel and toe walking x 20 feet with cues due to freezing during gait transitions Seated reaching behind her with yoga block for self care activity simulation Gait: Direction changes forward/backwards, turns Figure 8  [facility] Adult PT Treatment:                                                DATE: 01/08/24 Therapeutic Exercise: Standing PWR!  PWR up x 10 reps PWR twist x 10 reps PWR weight shift x 10 reps PWR step x 10 reps Neuromuscular re-ed: Single limb standing reaching anterior/posteriorly near support surface Marching adding arm swing Marching with arms overhead x 20 feet x 4 reps Therapeutic Activity: Backwards walking, direction changes with walking stepping laterally over objects near support surface                                                                                                                   PATIENT EDUCATION:  Education details: HEP Person educated: Patient Education method: Programmer, multimedia, Demonstration, and Handouts Education comprehension: verbalized understanding and returned demonstration  HOME EXERCISE PROGRAM: Access Code: [email or web address] URL: [email or web address] Date: 12/19/2023 Prepared by: Carlynn Herald  Exercises - Supine Bridge   - 1 x daily - 7 x weekly - 2 sets - 10 reps - Seated Long Arc Quad  - 1 x daily - 7 x weekly - 2 sets - 10 reps - Sit to Stand  - 3 x daily - 7 x weekly - 1-2 sets - 10 reps - Standing Heel Raises  - 1 x daily - 7 x weekly - 3 sets - 10 reps - Standing Ankle Dorsiflexion with Table Support  - 1 x daily - 7 x weekly - 3 sets - 10 reps - Standing Marching  - 1 x daily - 7 x weekly - 3 sets - 10 reps - Seated Hamstring Stretch  - 1 x daily - 7 x weekly - 3 sets - 10 reps -  Seated Long Arc Quad with Ankle Weight  - 1 x daily - 7 x weekly - 3 sets - 10 reps - Seated March with Ankle Weights at Foot  - 1 x daily - 7 x weekly - 3 sets - 10 reps   Added standing PWR! X 10 reps 4 x per week near a chair  Patient Education - Tips to reduce freezing episodes with standing or walking   ASSESSMENT:  CLINICAL IMPRESSION: The patient continues to improve with PWR! Moves and functional mobility. PT recommends transition to community exercise classes while awaiting neurology visit. We are going to hold at this time--- she feels confident in the HEP. PT has provided information regarding PWR! Exercise class in [place] and potential need for OT services. She does not wish to seek care in [place] at this time.   GOALS:  Goals reviewed with patient? Yes  SHORT TERM GOALS: Target date: 11/26/2023   Patient will be independent with initial HEP. Baseline:  Goal status: *Needs further cues  2. Patient will report centralization of radicular symptoms.  Baseline:  Goal status: MET  3. Patient will be independent with supine to sit transfers. Baseline:  Goal status: MET  LONG TERM GOALS: Target date: 02/04/2024  Patient will be independent with advanced/ongoing HEP to improve outcomes and carryover.  Baseline:  Goal status: MET  2.  Patient will be able to ambulate 600' in 6 min with LRAD with good safety to access community.  Baseline: 2:45 - 210' Goal status: PARTIALLY MET (improved walking  from 2:45 up to 4:35 today)  3.  Patient to have no recurrence of R radicular pain.  Baseline:  Goal status: MET   4.  Patient will demonstrate decreased 5XSTS to less than 16 seconds showing improved functional strength and decreasing risk for falls. Baseline: 24.7 sec; 12/19/23 15 sec Goal status: MET  5.  Patient will demonstrate at least 19/24 on DGI to improve gait stability and reduce risk for falls. Baseline: 13/24 on 12/24/23 Goal status: MET  6. Patient will demonstrate full/functional pain free lumbar ROM to perform ADLs.   Baseline: Goal status: MET  PLAN:  PT FREQUENCY: 2x/week  PT DURATION: 8 weeks  PLANNED INTERVENTIONS: 97164- PT Re-evaluation, 97110-Therapeutic exercises, 97530- Therapeutic activity, [id number]- Neuromuscular re-education, 97535- Self Care, 19147- Manual therapy, [id number]- Gait training, [phone]- Aquatic Therapy, 97014- Electrical stimulation (unattended), [phone]- Traction (mechanical), Patient/Family education, Balance training, Stair training, Taping, Dry Needling, Joint mobilization, Spinal mobilization, Cryotherapy, and Moist heat.  PLAN FOR NEXT SESSION: PT to put on hold until neurology visit. Let patient know if further visits indicated to call and we can schedule her (if it is within the next 30 days). Also recommend OT and/or access to community PWR classes. [place] offers [employer] classes but currently patient prefers to go to [employer] here in [place]. Recommend patient discuss resources with MD to know if PWR! Community classes offered in [place] (did not find other PWR certified therapists in directory).    Virgilene Stryker, PT 01/15/2024, 11:01 AM

## 2024-01-15 NOTE — Telephone Encounter (Signed)
 Copied from CRM [id number]. Topic: Clinical - Medication Question >> Jan 15, 2024  2:05 PM Tiffany B wrote: Reason for CRM: Caller would like to know where PCP orders herFreestyle libre diabetes monitor. Patient would like a follow up call today.

## 2024-01-16 ENCOUNTER — Telehealth: Payer: Self-pay

## 2024-01-16 ENCOUNTER — Telehealth: Payer: Self-pay | Admitting: Family Medicine

## 2024-01-16 NOTE — Telephone Encounter (Signed)
Thank you Cheryl!

## 2024-01-16 NOTE — Telephone Encounter (Signed)
 Copied from CRM [id number]. Topic: General - Other >> Jan 16, 2024  2:54 PM Nila Nephew wrote: Reason for CRM: Patient requesting a call from Sabino Niemann regarding a diabetic meter. Patient has a few questions. Please return patient call.

## 2024-01-16 NOTE — Progress Notes (Signed)
   01/16/2024  Patient ID: Jillian Huynh, female   DOB: [birth year], 78 y.o.   MRN: [id number]  In basket message from primary care provider office stating patient was requesting a call in regard to glucometer.  [name] is currently out for libre 3 sensors, so and an order has been placed via [employer] under [facility] standing order for CGM.  [name], PharmD, DPLA

## 2024-01-17 NOTE — Telephone Encounter (Signed)
 Does not look like this has every been ordered through our office Attempted call to patient to inform of this. Left a voice mail message requesting a return call.

## 2024-01-22 ENCOUNTER — Encounter: Payer: Medicare HMO | Admitting: Rehabilitative and Restorative Service Providers"

## 2024-01-24 NOTE — Progress Notes (Signed)
   01/24/2024  Patient ID: Jillian Huynh, female   DOB: [birth year], 78 y.o.   MRN: [id number]  Patient enrollment for health well grant that provides co-pay assistance for Entresto expiring 01/29/2024.  Submitted reenrollment for patient online, and grant is now extended through 01/28/2025.  Processing information and additional details can be found under patient's active FYI's.  Jillian Huynh, PharmD, DPLA

## 2024-02-04 DIAGNOSIS — R269 Unspecified abnormalities of gait and mobility: Secondary | ICD-10-CM | POA: Diagnosis not present

## 2024-02-04 DIAGNOSIS — Z1330 Encounter for screening examination for mental health and behavioral disorders, unspecified: Secondary | ICD-10-CM | POA: Diagnosis not present

## 2024-02-04 DIAGNOSIS — G20A1 Parkinson's disease without dyskinesia, without mention of fluctuations: Secondary | ICD-10-CM | POA: Diagnosis not present

## 2024-02-09 DIAGNOSIS — I6789 Other cerebrovascular disease: Secondary | ICD-10-CM | POA: Diagnosis not present

## 2024-02-09 DIAGNOSIS — R269 Unspecified abnormalities of gait and mobility: Secondary | ICD-10-CM | POA: Diagnosis not present

## 2024-02-09 DIAGNOSIS — G20A1 Parkinson's disease without dyskinesia, without mention of fluctuations: Secondary | ICD-10-CM | POA: Diagnosis not present

## 2024-02-09 DIAGNOSIS — I6782 Cerebral ischemia: Secondary | ICD-10-CM | POA: Diagnosis not present

## 2024-02-10 ENCOUNTER — Telehealth: Payer: Self-pay

## 2024-02-10 NOTE — Progress Notes (Signed)
   02/10/2024  Patient ID: Jillian Huynh, female   DOB: 02-Aug-1946, 78 y.o.   MRN: [id number]  Clinic routed request stating patient is out of Ozempic and has not received from [employer].  Contacted the company, and they state the patient's application for 2025 re-enrollment has been canceled; however, we were informed in January it was approved.  Ticket has been placed for application to be re-opened, and I will follow-up on the status tomorrow.  Patient also has not received Libre sensors from DME supplier (order submitted through [place]), and she is out of these. Supplier states they have not heard back from the patient, so she needs to call them to verify insurance and shipping information.    [name], PharmD, DPLA

## 2024-02-11 ENCOUNTER — Telehealth: Payer: Self-pay

## 2024-02-11 DIAGNOSIS — E119 Type 2 diabetes mellitus without complications: Secondary | ICD-10-CM | POA: Diagnosis not present

## 2024-02-11 MED ORDER — SEMAGLUTIDE (1 MG/DOSE) 4 MG/3ML ~~LOC~~ SOPN: 1.0000 mg | PEN_INJECTOR | SUBCUTANEOUS | 0 refills | Status: DC

## 2024-02-11 NOTE — Progress Notes (Signed)
   02/11/2024  Patient ID: Jillian Huynh, female   DOB: 02-Aug-1946, 78 y.o.   MRN: [id number]  Contacted [employer] [facility] to follow-up on processing of patient's 2025 re-enrollment application.  Patient has been approved, but medication will not arrive at [facility] for another 2 weeks (approximately).  I was able to get a 30 day voucher to cover 1 fill at Ms. [facility]le's pharmacy.  Will send prescription with voucher information to [employer] in [place].  BIN  [id number] PCN [id number] Grp [id number] ID  [id number]  Per update in [employer], DME supplier is shipping [place] readers to patient as of yesterday.  Contacting patient to make her aware of the above information.  [name], PharmD, DPLA

## 2024-02-12 ENCOUNTER — Other Ambulatory Visit: Payer: Self-pay | Admitting: Cardiology

## 2024-02-12 ENCOUNTER — Telehealth: Payer: Self-pay

## 2024-02-12 NOTE — Progress Notes (Signed)
   02/12/2024  Patient ID: Jillian Huynh, female   DOB: 1946/01/28, 78 y.o.   MRN: [id number]  [facility], and Ozempic 1mg  is going through on [employer] for $4.  [name] indicates patient should receive Libre sensors by Friday.  Contacted patient to make her aware.  We had a follow-up visit scheduled for Monday, so we have moved this out to 4/28.  [name], PharmD, DPLA

## 2024-02-14 ENCOUNTER — Telehealth: Payer: Self-pay

## 2024-02-14 NOTE — Progress Notes (Signed)
   02/14/2024  Patient ID: Jillian Huynh, female   DOB: [birth year], 78 y.o.   MRN: [id number]  Incoming call from patient stating [facility] is needing updated [facility] information to [employer] copay to.  Contacted pharmacy and provided new ID # where renewal was recently approved.  Copay is now $0.  Contacted patient to make her aware.  Jillian Huynh, PharmD, DPLA

## 2024-02-17 ENCOUNTER — Other Ambulatory Visit: Payer: Self-pay

## 2024-02-17 NOTE — Progress Notes (Unsigned)
 Electrophysiology Office Note:   Date:  02/19/2024  ID:  Jillian Huynh, DOB 06-Nov-1946, MRN [id number]  Primary Cardiologist: None Primary Heart Failure: None Electrophysiologist: Maurice Small, MD      History of Present Illness:   Jillian Huynh is a 78 y.o. female with h/o RBBB, CAD (elevated CAC), HFrecEF, HTN, HTN, DM II, GERD, OSA, depression seen today for routine electrophysiology followup.   Since last being seen in our clinic the patient reports she has been doing terribly as she was diagnosed with Parkinson's recently.  She feels like she has been struggling for over a year and a half to obtain a diagnosis.  She reports that she was hospitalized in [place] with palpitations and was given Eliquis after discharge.  She took the medication for 1 month and stopped it and the end of January 2025.  She had difficulty obtaining the medication due to cost.  She reports she has not had any more palpitations since that time.  She feels like she was very stressed over searching for her diagnosis of Parkinson's.  She denies chest pain, palpitations, dyspnea, PND, orthopnea, nausea, vomiting, dizziness, syncope, edema, weight gain, or early satiety.   Review of systems complete and found to be negative unless listed in HPI.   EP Information / Studies Reviewed:    EKG is not ordered today. EKG from 11/28/23  reviewed which showed NSR 74 bpm with LBBB      Studies:  cMRI 06/2022 > normal LV size with mild focal basal septal hypertrophy, no evidence for significant LVOT gradient or MV SAM, LVEF 49%, mild diffuse hypokinesis with septal-lateral dyssynchrony c/w LBBB, normal RV size / systolic function RVEF 51%, possible subtle mid-wall LGE at the lateral wall base, could be suggestive of prior myocarditis  ECHO 09/2022 > LVEF 50-55%, G1DD Cardiac Monitor 12/2023 > SR 54-100 bpm, ave 72 bpm, brief NSVT with longest episode 6 beats, episodes of SVT, most likely atrial runs, the longest 14  seconds, rare ectopy <1%, no symptoms reported    Arrhythmia / AAD  WCT   Risk Assessment/Calculations:    CHA2DS2-VASc Score = 7   This indicates a 11.2% annual risk of stroke. The patient's score is based upon: CHF History: 1 HTN History: 1 Diabetes History: 1 Stroke History: 0 Vascular Disease History: 1 Age Score: 2 Gender Score: 1             Physical Exam:   VS:  BP 118/72   Pulse 73   Ht 5\' 2"  (1.575 m)   Wt 162 lb 9.6 oz (73.8 kg)   SpO2 99%   BMI 29.74 kg/m    Wt Readings from Last 3 Encounters:  02/19/24 162 lb 9.6 oz (73.8 kg)  12/31/23 160 lb (72.6 kg)  11/28/23 158 lb 12.8 oz (72 kg)     GEN: elderly female, well nourished, well developed in no acute distress NECK: No JVD; No carotid bruits CARDIAC: Regular rate and rhythm, no murmurs, rubs, gallops RESPIRATORY:  Clear to auscultation without rales, wheezing or rhonchi  ABDOMEN: Soft, non-tender, non-distended EXTREMITIES:  No edema; No deformity   ASSESSMENT AND PLAN:    WCT  Similar morphology & axis to underlying LBBB, no clear evidence of A-V dissociation, Wellens criteria not met for VT -suspected to be possible AFL with underlying LBBB -continue metoprolol 25 mg BID  -No recurrence symptoms/palpitations -Reviewed with Dr. Parks Ranger Eliquis for stroke prophylaxis.  Patient indicates difficulty obtaining medication due to cost.  Will reach out to medication assist program for review.  If she is not a candidate for [employer] may cover Xarelto. 30-day free trial card given to the patient.  She has an active prescription for Eliquis with refills. -Arrange for an appointment with Dr. Nelly Laurence for loop insertion for rhythm monitoring   Secondary Hypercoagulable State  -continue eliquis 5mg  BID, dose reviewed and appropriate by age / wt  NICM / HFrecEF  -euvolemic on exam   CAD  Elevated CAC  -no anginal symptoms  OSA  -intolerant of CPAP   Parkinson's Disease  -recent  diagnosis, started on carbidopa-levodopa     Follow up with Dr. Nelly Laurence  for loop insertion   Signed, Canary Brim, NP-C, AGACNP-BC [facility] - Electrophysiology  02/19/2024, 5:03 PM

## 2024-02-19 ENCOUNTER — Ambulatory Visit: Attending: Pulmonary Disease | Admitting: Pulmonary Disease

## 2024-02-19 ENCOUNTER — Encounter: Payer: Self-pay | Admitting: Pulmonary Disease

## 2024-02-19 VITALS — BP 118/72 | HR 73 | Ht 62.0 in | Wt 162.6 lb

## 2024-02-19 DIAGNOSIS — I5032 Chronic diastolic (congestive) heart failure: Secondary | ICD-10-CM | POA: Diagnosis not present

## 2024-02-19 DIAGNOSIS — G4733 Obstructive sleep apnea (adult) (pediatric): Secondary | ICD-10-CM

## 2024-02-19 DIAGNOSIS — I10 Essential (primary) hypertension: Secondary | ICD-10-CM | POA: Diagnosis not present

## 2024-02-19 DIAGNOSIS — D6869 Other thrombophilia: Secondary | ICD-10-CM | POA: Diagnosis not present

## 2024-02-19 DIAGNOSIS — I429 Cardiomyopathy, unspecified: Secondary | ICD-10-CM | POA: Diagnosis not present

## 2024-02-19 DIAGNOSIS — I447 Left bundle-branch block, unspecified: Secondary | ICD-10-CM | POA: Diagnosis not present

## 2024-02-19 DIAGNOSIS — I2510 Atherosclerotic heart disease of native coronary artery without angina pectoris: Secondary | ICD-10-CM | POA: Diagnosis not present

## 2024-02-19 NOTE — Patient Instructions (Signed)
 Medication Instructions:  You do need to continue the eliquis 5 mg twice daily. *If you need a refill on your cardiac medications before your next appointment, please call your pharmacy*  Lab Work: None ordered If you have labs (blood work) drawn today and your tests are completely normal, you will receive your results only by: MyChart Message (if you have MyChart) OR A paper copy in the mail If you have any lab test that is abnormal or we need to change your treatment, we will call you to review the results.  Follow-Up: At [facility], you and your health needs are our priority.  As part of our continuing mission to provide you with exceptional heart care, our providers are all part of one team.  This team includes your primary Cardiologist (physician) and Advanced Practice Providers or APPs (Physician Assistants and Nurse Practitioners) who all work together to provide you with the care you need, when you need it.  Your next appointment:   Next available  Provider:   York Pellant, MD to discuss loop   We recommend signing up for the patient portal called "MyChart".  Sign up information is provided on this After Visit Summary.  MyChart is used to connect with patients for Virtual Visits (Telemedicine).  Patients are able to view lab/test results, encounter notes, upcoming appointments, etc.  Non-urgent messages can be sent to your provider as well.   To learn more about what you can do with MyChart, go to [email or web address].     1st Floor: - Lobby - Registration  - Pharmacy  - Lab - Cafe  2nd Floor: - PV Lab - Diagnostic Testing (echo, CT, nuclear med)  3rd Floor: - Vacant  4th Floor: - TCTS (cardiothoracic surgery) - AFib Clinic - Structural Heart Clinic - Vascular Surgery  - Vascular Ultrasound  5th Floor: - HeartCare Cardiology (general and EP) - Clinical Pharmacy for coumadin, hypertension, lipid, weight-loss medications, and med management  appointments    Valet parking services will be available as well.

## 2024-02-24 ENCOUNTER — Other Ambulatory Visit: Payer: Self-pay | Admitting: Sports Medicine

## 2024-02-24 DIAGNOSIS — F321 Major depressive disorder, single episode, moderate: Secondary | ICD-10-CM

## 2024-03-03 ENCOUNTER — Telehealth: Payer: Self-pay

## 2024-03-03 NOTE — Telephone Encounter (Signed)
 Patients spouse came into office to get Ozempic  on 03/03/24, thanks.

## 2024-03-03 NOTE — Telephone Encounter (Signed)
 Forwarding to [place] as an Financial planner.  Tonya   [employer] PAP shipment for Ozempic  1 mg dose / 4 boxes received this morning. Please contact the patient to come and pick up their order today. Placed in the PAP fridge with patient identifier. Thanks in advance.   NDC: 1610-9604-54 LOT: [id number] EXP: 2026-08-11

## 2024-03-03 NOTE — Telephone Encounter (Signed)
 Pt advised.

## 2024-03-09 ENCOUNTER — Other Ambulatory Visit: Payer: Self-pay

## 2024-03-09 NOTE — Progress Notes (Signed)
   03/09/2024  Patient ID: Jillian Huynh, female   DOB: 08-11-1946, 78 y.o.   MRN: [id number]  S/O Telephone visit to follow-up on management of diabetes and blood pressure   Diabetes -Current medications:  Ozempic  1mg  weekly, Farxiga  10mg  daily  -4 boxes of Ozempic  1mg  arrived from [employer] earlier this month and patient has picked up -A1c in February was 7.3% -Checks home BG daily and states FBG averaging 125-135.   -Does not endorse any s/sx of hypoglycemia -Patient continues with improvement in constipation, using docusate 100mg  BID and Miralax as needed               Hypotension -Current medications:  metoprolol  xl 12.5mg  daily, Entresto  24/26mg  BID -Patient enrolled in [employer] grant to assist with copay of Entresto  -She does check home BP regularly and this is averaging 120/70 -Does not endorse s/sx of hypotension or hypertension  Parkinson's Disease -Recently diagnosed and carbidopa-levodopa initiated -Patient endorses improvement in symptoms since starting medication  Secondary Hypercoagulable State -Recent follow-up with cardiology after episode 3-4 months ago of racing heartbeat -A-fib is suspected, so Eliquis  5mg  BID prescribed for stroke prophylaxis -Patient endorses medication is very expensive for her, and she does not qualify for PAP  A/P   Diabetes -Continue current regimen and regular monitoring of BG at this time   Hypotension -Continue current regimen and regular monitoring at this time  Parkinson's Disease -Continue current regimen and regular follow-up  Secondary Hypercoagulable State -Contacted patient's insurance, and Dabigatran 150mg  BID would be $53/month versus $150/month foEliquis  5mg  BID.  Recommend patient discuss possibility of changing medication with cardiology if she will be on this long-term.  Follow-up:  3 months   Linn Rich, PharmD, DPLA

## 2024-03-30 ENCOUNTER — Ambulatory Visit: Admitting: Cardiovascular Disease

## 2024-03-31 ENCOUNTER — Ambulatory Visit (INDEPENDENT_AMBULATORY_CARE_PROVIDER_SITE_OTHER): Payer: Medicare HMO | Admitting: Family Medicine

## 2024-03-31 ENCOUNTER — Encounter: Payer: Self-pay | Admitting: Family Medicine

## 2024-03-31 VITALS — BP 102/61 | HR 77 | Ht 62.0 in | Wt 158.0 lb

## 2024-03-31 DIAGNOSIS — I5022 Chronic systolic (congestive) heart failure: Secondary | ICD-10-CM | POA: Diagnosis not present

## 2024-03-31 DIAGNOSIS — I110 Hypertensive heart disease with heart failure: Secondary | ICD-10-CM | POA: Diagnosis not present

## 2024-03-31 DIAGNOSIS — G20A1 Parkinson's disease without dyskinesia, without mention of fluctuations: Secondary | ICD-10-CM | POA: Diagnosis not present

## 2024-03-31 DIAGNOSIS — E118 Type 2 diabetes mellitus with unspecified complications: Secondary | ICD-10-CM

## 2024-03-31 DIAGNOSIS — Z7984 Long term (current) use of oral hypoglycemic drugs: Secondary | ICD-10-CM

## 2024-03-31 DIAGNOSIS — E1159 Type 2 diabetes mellitus with other circulatory complications: Secondary | ICD-10-CM

## 2024-03-31 DIAGNOSIS — F325 Major depressive disorder, single episode, in full remission: Secondary | ICD-10-CM | POA: Diagnosis not present

## 2024-03-31 DIAGNOSIS — I10 Essential (primary) hypertension: Secondary | ICD-10-CM

## 2024-03-31 LAB — POCT GLYCOSYLATED HEMOGLOBIN (HGB A1C): Hemoglobin A1C: 7.2 % — AB (ref 4.0–5.6)

## 2024-03-31 NOTE — Assessment & Plan Note (Signed)
 Well controlled. Continue current regimen. Follow up in  6 mo

## 2024-03-31 NOTE — Assessment & Plan Note (Signed)
 She has had a noticeable improvement after starting the Sinemet she says she feels much more like herself from about 18 months ago she has better movement she is not freezing as much she just feels more like herself she is was smiling today and laughing.  Says her depression is much better because just feeling like there is an answer has made such a huge difference in her mood.  Is an upcoming follow-up with neurology next month.

## 2024-03-31 NOTE — Assessment & Plan Note (Addendum)
 Patient enrolled in [facility] grant to assist with copay of Entresto .    No sign of volume overload on exam today.  She really wants to come off the Eliquis .

## 2024-03-31 NOTE — Assessment & Plan Note (Addendum)
 Scheduled for eye exam next week. A1C of 7.2 today. Stable.  Continue to work on [employer]. F/U in 45 mo doing well on Ozempic  1mg .  She is on stool softener QID and PRN miralax.

## 2024-03-31 NOTE — Assessment & Plan Note (Signed)
 She really is doing well she is off the Lexapro  now.  She is currently on Cymbalta  but more for her low back pain then for mood she says just having an answer to what was going on with her has made a tremendous difference in her coping with how she is feeling she had felt like her life had been flipped upside down with what she was experiencing physically.

## 2024-03-31 NOTE — Progress Notes (Signed)
 Established Patient Office Visit  Subjective  Patient ID: Jillian Huynh, female    DOB: 10-05-1946  Age: 78 y.o. MRN: [id number]  Chief Complaint  Patient presents with   Diabetes    HPI  Hypertension- Pt denies chest pain, SOB, dizziness, or heart palpitations.  Taking meds as directed w/o problems.  Denies medication side effects.    Diabetes - no hypoglycemic events. No wounds or sores that are not healing well. No increased thirst or urination. Checking glucose at home. Taking medications as prescribed without any side effects. Out of her ozempic  for 2 weeks.  Work with Bartholomew Light on PAP.    She has felt so much better since being on medications for her Parkinson.  .We had referred her to Neurology last November shuffling gait, freezing and macrographia.  She was evaluated in March and started on Sinemet. She feels like it has made a huge different in her rigitidity     ROS    Objective:     BP 102/61   Pulse 77   Ht 5\' 2"  (1.575 m)   Wt 158 lb 0.6 oz (71.7 kg)   SpO2 100%   BMI 28.91 kg/m    Physical Exam Vitals and nursing note reviewed.  Constitutional:      Appearance: Normal appearance.  HENT:     Head: Normocephalic and atraumatic.  Eyes:     Conjunctiva/sclera: Conjunctivae normal.  Cardiovascular:     Rate and Rhythm: Normal rate and regular rhythm.  Pulmonary:     Effort: Pulmonary effort is normal.     Breath sounds: Normal breath sounds.  Skin:    General: Skin is warm and dry.  Neurological:     Mental Status: She is alert.  Psychiatric:        Mood and Affect: Mood normal.      Results for orders placed or performed in visit on 03/31/24  POCT HgB A1C  Result Value Ref Range   Hemoglobin A1C 7.2 (A) 4.0 - 5.6 %   HbA1c POC (<> result, manual entry)     HbA1c, POC (prediabetic range)     HbA1c, POC (controlled diabetic range)        The ASCVD Risk score (Arnett DK, et al., 2019) failed to calculate for the following reasons:   The  valid total cholesterol range is 130 to 320 mg/dL    Assessment & Plan:   Problem List Items Addressed This Visit       Cardiovascular and Mediastinum   Type 2 diabetes mellitus with circulatory disorder (HCC)   Scheduled for eye exam next week. A1C of 7.2 today. Stable.  Continue to work on [employer]. F/U in 45 mo doing well on Ozempic  1mg .  She is on stool softener QID and PRN miralax.       Systolic CHF [facility])   Patient enrolled in [employer] grant to assist with copay of Entresto .    No sign of volume overload on exam today.  She really wants to come off the Eliquis .        Hypertensive heart disease with heart failure (HCC)   BP (high blood pressure) - Primary   Well controlled. Continue current regimen. Follow up in  76mo       Relevant Orders   Lipid panel     Endocrine   Diabetes mellitus type 2, controlled (HCC)   Relevant Orders   Lipid panel   POCT HgB A1C (Completed)     Nervous  and Auditory   Parkinson disease (HCC)   She has had a noticeable improvement after starting the Sinemet she says she feels much more like herself from about 18 months ago she has better movement she is not freezing as much she just feels more like herself she is was smiling today and laughing.  Says her depression is much better because just feeling like there is an answer has made such a huge difference in her mood.  Is an upcoming follow-up with neurology next month.        Other   Depression, major, single episode, complete remission (HCC)   She really is doing well she is off the Lexapro  now.  She is currently on Cymbalta  but more for her low back pain then for mood she says just having an answer to what was going on with her has made a tremendous difference in her coping with how she is feeling she had felt like her life had been flipped upside down with what she was experiencing physically.        Return in about 4 months (around 08/01/2024) for Diabetes follow-up.     Duaine German, MD

## 2024-04-01 ENCOUNTER — Ambulatory Visit: Payer: Self-pay | Admitting: Family Medicine

## 2024-04-01 ENCOUNTER — Encounter: Payer: Self-pay | Admitting: Family Medicine

## 2024-04-01 LAB — LIPID PANEL
Chol/HDL Ratio: 4.2 ratio (ref 0.0–4.4)
Cholesterol, Total: 191 mg/dL (ref 100–199)
HDL: 45 mg/dL
LDL Chol Calc (NIH): 101 mg/dL — ABNORMAL HIGH (ref 0–99)
Triglycerides: 262 mg/dL — ABNORMAL HIGH (ref 0–149)
VLDL Cholesterol Cal: 45 mg/dL — ABNORMAL HIGH (ref 5–40)

## 2024-04-01 NOTE — Progress Notes (Signed)
 Hi [place], LDL is just borderline at 782 goal is less than 100.  Triglycerides are up.  Are you taking your atorvastatin  pretty regularly?  Do you need refills?  I Am happy to send in a new prescription for 90 days but you do need to make sure you are taking it regularly.  So let me know when you get your eye exam updated so that we can get your chart updated.

## 2024-04-03 ENCOUNTER — Other Ambulatory Visit: Payer: Self-pay | Admitting: Family Medicine

## 2024-04-07 DIAGNOSIS — Z794 Long term (current) use of insulin: Secondary | ICD-10-CM | POA: Diagnosis not present

## 2024-04-07 DIAGNOSIS — E119 Type 2 diabetes mellitus without complications: Secondary | ICD-10-CM | POA: Diagnosis not present

## 2024-04-07 DIAGNOSIS — H25813 Combined forms of age-related cataract, bilateral: Secondary | ICD-10-CM | POA: Diagnosis not present

## 2024-04-07 DIAGNOSIS — H524 Presbyopia: Secondary | ICD-10-CM | POA: Diagnosis not present

## 2024-04-07 LAB — HM DIABETES EYE EXAM

## 2024-04-08 DIAGNOSIS — R269 Unspecified abnormalities of gait and mobility: Secondary | ICD-10-CM | POA: Diagnosis not present

## 2024-04-08 DIAGNOSIS — G20A1 Parkinson's disease without dyskinesia, without mention of fluctuations: Secondary | ICD-10-CM | POA: Diagnosis not present

## 2024-04-10 ENCOUNTER — Telehealth: Payer: Self-pay

## 2024-04-10 NOTE — Telephone Encounter (Signed)
 Advised patient the DM eye exam is in her chart and was reviewed by Dr Greer Leak.

## 2024-04-10 NOTE — Telephone Encounter (Signed)
 Copied from CRM [id number]. Topic: General - Other >> Apr 10, 2024  9:09 AM Jillian Huynh wrote: Reason for CRM: Patient called and wants to speak with providers nurse about her eye exam, patients callback number is [phone].

## 2024-05-21 ENCOUNTER — Telehealth: Payer: Self-pay

## 2024-05-21 NOTE — Telephone Encounter (Signed)
 Hello Jon, thank you for your help. I Placed sample sensor at front desk for pick up( patient informed ) . I am forwarding the message for cheryl gentry to look into upon her return to the office as well

## 2024-05-21 NOTE — Telephone Encounter (Signed)
 Hey,  Does the office have a sample box to provide the patient with today as she is out? Looks like [facility] normally orders through parachute for patient from what I can see, but I don't have access to the past or current orders in the parachute portal for this office as I am covering.  Not sure if anyone else there at the office has access to [employer]ute to check on this for patient if there is no sample available? Other alternatives would be to send order to local pharmacy but not sure what that would cost the patient or have patient complete fingerstick monitoring until cheryl returns.  Jon VEAR Lindau, PharmD Clinical Pharmacist [phone]

## 2024-05-21 NOTE — Telephone Encounter (Signed)
 Hello Jon,  I see that cheryl is out of the office today. Would you please check into this for the patient. She states she usually gets the sensors through PAN? She is out of sensors currently.

## 2024-05-21 NOTE — Telephone Encounter (Signed)
 Copied from CRM [id number]. Topic: Clinical - Order For Equipment >> May 20, 2024  3:16 PM Farrel B wrote: Jillian Huynh has called in regards to her Continuous Glucose Sensor (FREESTYLE LIBRE 3 PLUS SENSOR) MISC, she states that she has been gone out of town for the past three weeks and thought when she got home she would have her testers however, she has not yet received them and is unable to get in touch with anyone to in the pharmacy that normally assist her. She states she's on the grant for assistance with the equipment, but is requesting a call back for assistance.

## 2024-05-25 ENCOUNTER — Telehealth: Payer: Self-pay

## 2024-05-25 MED ORDER — FREESTYLE LIBRE 3 PLUS SENSOR MISC: 4 refills | Status: AC

## 2024-05-25 NOTE — Progress Notes (Signed)
   05/25/2024  Patient ID: Inocente Section, female   DOB: [birth year], 78 y.o.   MRN: [id number]  Patient requesting refills for [facility] 3+ sensors previously ordered through [employer].  I attempted to place an order in [employer] for refills, but platform is not allowing since patient is no longer using insulin  to manage diabetes.  I was able to perform a test claim to patient's Medicare D, and sensors are coming back with $0 copay.  Sending order to patient's pharmacy under [facility] standing order for CGM.  Attempted to call patient to make her aware but did not get an answer; message was left with my direct phone number.  Telephone follow-up is already scheduled with patient 7/28.  Channing DELENA Mealing, PharmD, DPLA

## 2024-06-08 ENCOUNTER — Other Ambulatory Visit: Payer: Self-pay

## 2024-06-08 NOTE — Progress Notes (Signed)
   06/08/2024  Patient ID: Inocente Section, female   DOB: 12/20/1945, 78 y.o.   MRN: [id number]  Subjective/Objective Telephone visit to follow-up on management of diabetes and blood pressure   Diabetes -Current medications:  Ozempic  1mg  weekly, Farxiga  10mg  daily  -A1c in May was 7.2% -Checks home BG daily and states FBG averaging 125-135.   -Does not endorse any s/sx of hypoglycemia -Patient continues with improvement in constipation, using docusate 100mg  BID and Miralax as needed               Hypotension -Current medications:  metoprolol  xl 12.5mg  daily, Entresto  24/26mg  BID -Patient enrolled in [employer] grant to assist with copay of Entresto  -She does check home BP regularly and this is averaging 115-120/60-70 -Does not endorse s/sx of hypotension or hypertension   Restless Leg Syndrome -Current medications:  ropinirole  4mg  at bedtime -Patient reporting worsening of symptoms at nightime and during the day, stating sometimes she cannot sit to even eat from the discomfort  Hyperlipidemia -Current medications:  atorvastatin  40mg  daily, ezetimibe  10mg  daily -LDL 101 and TG 262 recently -Fill history reflects good adherence to ezetimibe , but does not show atorvastatin  has been filled since November 2024   Assessment/Plan   Diabetes -Continue current regimen and regular monitoring of BG at this time   Hypotension -Continue current regimen and regular monitoring at this time   Restless Leg Syndrome -Patient could be experiencing augmentation from long-term use of dopamine agonists (ropinorole) -PD could also be worsening symptoms -Pharmacotherapy adjustments that could be considered include: split dosing of ropinorole (one-half tablet in the morning and one-half at night); if symptoms do not improve, could slowly taper dose and add a new agent such as gabapentin or pregabalin .  I recommend coordination with neurology if medication changes are considered, as these medications can  also affect treatment of PD.  Hyperlipidemia -Confirming patient is currently taking atorvastatin  as prescribed (sent MyChart message)   Follow-up:  Sending MyChart message to schedule another follow-up in 4-6 weeks   Channing DELENA Mealing, PharmD, DPLA

## 2024-06-11 ENCOUNTER — Telehealth: Payer: Self-pay

## 2024-06-11 NOTE — Progress Notes (Signed)
   06/11/2024  Patient ID: Inocente Section, female   DOB: [birth year], 78 y.o.   MRN: [id number]  Outreach attempt to inform Ms. Samara that Dr. Alvan would like to discuss a potential medication change to help with RLS.  I was not able to reach the patient but did leave a voicemail with my direct phone number.  Channing DELENA Mealing, PharmD, DPLA

## 2024-06-12 ENCOUNTER — Other Ambulatory Visit: Payer: Self-pay | Admitting: *Deleted

## 2024-06-15 ENCOUNTER — Telehealth: Payer: Self-pay

## 2024-06-15 NOTE — Telephone Encounter (Signed)
 I called and advised Sugar that the Ozempic  was ready for pick up.

## 2024-06-16 NOTE — Telephone Encounter (Signed)
 Patient came into office today 06/16/24 to get her Ozempic , thanks.

## 2024-06-17 ENCOUNTER — Encounter: Payer: Self-pay | Admitting: Family Medicine

## 2024-06-17 ENCOUNTER — Ambulatory Visit (INDEPENDENT_AMBULATORY_CARE_PROVIDER_SITE_OTHER): Admitting: Family Medicine

## 2024-06-17 VITALS — BP 106/53 | HR 76 | Ht 62.0 in | Wt 153.8 lb

## 2024-06-17 DIAGNOSIS — I110 Hypertensive heart disease with heart failure: Secondary | ICD-10-CM | POA: Diagnosis not present

## 2024-06-17 DIAGNOSIS — I10 Essential (primary) hypertension: Secondary | ICD-10-CM

## 2024-06-17 DIAGNOSIS — G20A1 Parkinson's disease without dyskinesia, without mention of fluctuations: Secondary | ICD-10-CM | POA: Diagnosis not present

## 2024-06-17 DIAGNOSIS — G2581 Restless legs syndrome: Secondary | ICD-10-CM

## 2024-06-17 MED ORDER — GABAPENTIN 100 MG PO CAPS: ORAL_CAPSULE | ORAL | 0 refills | Status: DC

## 2024-06-17 MED ORDER — ROPINIROLE HCL 1 MG PO TABS
ORAL_TABLET | ORAL | 0 refills | Status: DC
Start: 2024-06-17 — End: 2024-07-23

## 2024-06-17 NOTE — Assessment & Plan Note (Signed)
 She is actually doing really well with her carbidopa levodopa but it has been life-changing for her and she has been very happy with her neurologist.

## 2024-06-17 NOTE — Progress Notes (Signed)
 Established Patient Office Visit  Subjective  Patient ID: Jillian Huynh, female    DOB: [birth year]  Age: 78 y.o. MRN: [id number]  Chief Complaint  Patient presents with   Medical Management of Chronic Issues    HPI  She is here today discuss in a couple issues.  She currently has restless leg and is up to 4 mg on her ropinirole .  She feels like she is still having some breakthrough symptoms not daily but quite frequently where she still having a lot of leg movements she has to get up and walk to the point that it almost drives her crazy.  She says she is very consistent with the timing and dosing of her medications  Blood pressures have been on the lower end of normal once in a while she will feel little lightheaded with position change but not frequently overall she feels like she is doing much much better since starting medication for her Parkinson's.  She feels like her ability to walk is improved greatly.    ROS    Objective:     BP (!) 106/53   Pulse 76   Ht 5' 2 (1.575 m)   Wt 153 lb 12.8 oz (69.8 kg)   SpO2 98%   BMI 28.13 kg/m    Physical Exam Vitals and nursing note reviewed.  Constitutional:      Appearance: Normal appearance.  HENT:     Head: Normocephalic and atraumatic.  Eyes:     Conjunctiva/sclera: Conjunctivae normal.  Cardiovascular:     Rate and Rhythm: Normal rate and regular rhythm.  Pulmonary:     Effort: Pulmonary effort is normal.     Breath sounds: Normal breath sounds.  Skin:    General: Skin is warm and dry.  Neurological:     Mental Status: She is alert.  Psychiatric:        Mood and Affect: Mood normal.      No results found for any visits on 06/17/24.    The 10-year ASCVD risk score (Arnett DK, et al., 2019) is: 31.9%    Assessment & Plan:   Problem List Items Addressed This Visit       Cardiovascular and Mediastinum   Hypertensive heart disease with heart failure (HCC)   No sign of volume overload and blood  pressures are on the lower end of normal.      BP (high blood pressure)   Blood pressure is a little bit low today she is splitting her metoprolol  but she is also on 10 mg of Farxiga  as well as Entresto .  Just encouraged her to make sure she staying hydrated.        Nervous and Auditory   Parkinson disease (HCC)   She is actually doing really well with her carbidopa levodopa but it has been life-changing for her and she has been very happy with her neurologist.      Relevant Medications   rOPINIRole  (REQUIP ) 1 MG tablet   gabapentin  (NEURONTIN ) 100 MG capsule     Other   Restless leg - Primary   We did discuss options.  Will try switching her to gabapentin  and taper off of the right Purinol.  Will probably need to work up to about 600 mg but because she is over 75 and rather slowly taper that over a couple of weeks and make sure that she is not feeling overly sedated.  Will also check iron levels she said she struggled with iron deficiency most  of her life so we will make sure that her ferritin is at least at 60 to make sure that she is adequately really repleted in regards to her iron.      Relevant Medications   rOPINIRole  (REQUIP ) 1 MG tablet   gabapentin  (NEURONTIN ) 100 MG capsule   Other Relevant Orders   CBC with Differential/Platelet   Iron, TIBC and Ferritin Panel    No follow-ups on file.    Dorothyann Byars, MD

## 2024-06-17 NOTE — Assessment & Plan Note (Signed)
 Blood pressure is a little bit low today she is splitting her metoprolol  but she is also on 10 mg of Farxiga  as well as Entresto .  Just encouraged her to make sure she staying hydrated.

## 2024-06-17 NOTE — Assessment & Plan Note (Addendum)
 We did discuss options.  Will try switching her to gabapentin  and taper off of the right Purinol.  Will probably need to work up to about 600 mg but because she is over 75 and rather slowly taper that over a couple of weeks and make sure that she is not feeling overly sedated.  Will also check iron levels she said she struggled with iron deficiency most of her life so we will make sure that her ferritin is at least at 60 to make sure that she is adequately really repleted in regards to her iron.

## 2024-06-17 NOTE — Assessment & Plan Note (Signed)
 No sign of volume overload and blood pressures are on the lower end of normal.

## 2024-06-17 NOTE — Patient Instructions (Signed)
 Please follow the taper instructions on the medications. Once you get up to the 3 tabs on gabapentin , if you feel we need to go higher please let me know

## 2024-06-18 LAB — CBC WITH DIFFERENTIAL/PLATELET
Basophils Absolute: 0.1 x10E3/uL (ref 0.0–0.2)
Basos: 1 %
EOS (ABSOLUTE): 0.1 x10E3/uL (ref 0.0–0.4)
Eos: 2 %
Hematocrit: 38.8 % (ref 34.0–46.6)
Hemoglobin: 12.3 g/dL (ref 11.1–15.9)
Immature Grans (Abs): 0 x10E3/uL (ref 0.0–0.1)
Immature Granulocytes: 0 %
Lymphocytes Absolute: 2 x10E3/uL (ref 0.7–3.1)
Lymphs: 31 %
MCH: 28.5 pg (ref 26.6–33.0)
MCHC: 31.7 g/dL (ref 31.5–35.7)
MCV: 90 fL (ref 79–97)
Monocytes Absolute: 0.4 x10E3/uL (ref 0.1–0.9)
Monocytes: 7 %
Neutrophils Absolute: 3.8 x10E3/uL (ref 1.4–7.0)
Neutrophils: 59 %
Platelets: 232 x10E3/uL (ref 150–450)
RBC: 4.31 x10E6/uL (ref 3.77–5.28)
RDW: 13.3 % (ref 11.7–15.4)
WBC: 6.4 x10E3/uL (ref 3.4–10.8)

## 2024-06-18 LAB — IRON,TIBC AND FERRITIN PANEL
Ferritin: 177 ng/mL — ABNORMAL HIGH (ref 15–150)
Iron Saturation: 25 % (ref 15–55)
Iron: 73 ug/dL (ref 27–139)
Total Iron Binding Capacity: 294 ug/dL (ref 250–450)
UIBC: 221 ug/dL (ref 118–369)

## 2024-06-19 ENCOUNTER — Ambulatory Visit: Payer: Self-pay | Admitting: Family Medicine

## 2024-06-19 NOTE — Progress Notes (Signed)
 Hi Linnie, iron levels overall look okay.  Some slight drift downward compared to a year ago but still in the normal range.  Blood count looks great no sign of anemia.

## 2024-07-03 ENCOUNTER — Other Ambulatory Visit: Payer: Self-pay | Admitting: Family Medicine

## 2024-07-03 NOTE — Telephone Encounter (Signed)
 Requesting rx rf of Ropinirole  1mg   Last written 06/17/2024 as 16 day supply Last OV 06/17/2024 Upcoming appt =07/23/2024

## 2024-07-03 NOTE — Telephone Encounter (Signed)
 Patient informed. States she knows not to take Ropinirole  and states this was an automatic refill request

## 2024-07-03 NOTE — Telephone Encounter (Signed)
 We tapered her off. She isn't supposed to be on this

## 2024-07-11 ENCOUNTER — Other Ambulatory Visit: Payer: Self-pay | Admitting: Family Medicine

## 2024-07-11 DIAGNOSIS — G2581 Restless legs syndrome: Secondary | ICD-10-CM

## 2024-07-14 ENCOUNTER — Encounter: Payer: Self-pay | Admitting: Sports Medicine

## 2024-07-16 NOTE — Telephone Encounter (Signed)
 Copied from CRM [id number]. Topic: Clinical - Prescription Issue >> Jul 16, 2024 12:27 PM Diannia H wrote: Reason for CRM: Patient called and she is out of her Gabapentin  100 MG and she has to take it by 5pm today. Could you please assist? Patients callback number is [phone].

## 2024-07-16 NOTE — Telephone Encounter (Signed)
 Rx refill request completed. The patient has been contacted and updated. She will pick up the rx this afternoon. No further action is required.

## 2024-07-20 ENCOUNTER — Other Ambulatory Visit: Payer: Self-pay

## 2024-07-20 DIAGNOSIS — E7800 Pure hypercholesterolemia, unspecified: Secondary | ICD-10-CM

## 2024-07-20 MED ORDER — ATORVASTATIN CALCIUM 40 MG PO TABS
40.0000 mg | ORAL_TABLET | Freq: Every day | ORAL | 3 refills | Status: AC
Start: 2024-07-20 — End: ?

## 2024-07-20 NOTE — Progress Notes (Unsigned)
   07/20/2024  Patient ID: Jillian Huynh Section, female   DOB: 03-12-1946, 78 y.o.   MRN: [id number]  Subjective/Objective Telephone visit to follow-up on management of diabetes and blood pressure   Diabetes -Current medications:  Ozempic  1mg  weekly, Farxiga  10mg  daily  -A1c in May was 7.2% -Checks home BG daily and states FBG averaging in the 130's -Endorses BG has been a little higher than usual, because she has been eating more bread -Does not endorse any s/sx of hypoglycemia -Patient continues with improvement in constipation, using docusate 100mg  and Miralax as needed   -Receives Farxiga  and Ozempic  through patient assistance programs- currently has 3 boxes of Ozempic  on hand             Hypotension -Current medications:  metoprolol  xl 12.5mg  daily, Entresto  24/26mg  BID -Patient enrolled in [employer] grant to assist with copay of Entresto  -She does check home BP on occasion and states it continues to run on the low side; BP at last OV was 106/53 on 8/6 -Does endorse some occasional dizziness she believes may be related to low BP -Farxiga  also likely contributing to hypotension   Restless Leg Syndrome -Current medications:  gabapentin  300mg  at bedtime, ropinorole 2mg  at bedtime -Patient was recently started on gabapentin  and ropinorole was tapered off; patient states RLS symptoms improved greatly, but she began experiencing trouble sleeping.  Started taking one-half of 4mg  ropinorole about 1 week ago to help with sleep.  She does state since resuming ropinorole at 2mg  dose, she woke up once with RLS symptoms.  Hyperlipidemia -Current medications:  atorvastatin  40mg  daily, ezetimibe  10mg  daily -LDL 101 and TG 262 recently -Fill history reflects good adherence to ezetimibe , but does not show atorvastatin  has been filled since November 2024.  Patient states husband takes the same dose of atorvastatin ; she ran out of hers and has been taking his.   Assessment/Plan   Diabetes -Continue  current regimen and regular monitoring of BG at this time -Sees PCP again Thursday, 9/11 and will be due for A1c -Based on A1c results and BP, could consider decreasing Farxiga  to 5mg  daily and increasing Ozempic  to 2mg  weekly (would likely need to discuss bowel regimen to prevent constipation since this has been a significant issue before)   Hypotension -Continue current regimen and regular monitoring at this time -Consider decreasing Farxiga  to 5mg  or stopping metoprolol  (based on HR)   Restless Leg Syndrome -Consulting PCP; could consider increasing dose of gabapentin  to see if this would help with sleep, or could continue ropinorole 2mg  at bedtime along with gabapentin  300mg - continue to monitor occurrence of RLS sx  Hyperlipidemia -Continue atorvastatin  40mg  daily and ezetimibe  10mg  daily -Order pending for refill of atorvastatin  to ensure continued availability/adherence -Recommend follow-up Lipid panel in another 3 months; if LDL remains >70, could consider increasing atorvastatin  to 80mg  daily or changing to rosuvastatin 40mg  daily   Follow-up:  11/4   Jillian Huynh, PharmD, DPLA

## 2024-07-23 ENCOUNTER — Encounter: Payer: Self-pay | Admitting: Family Medicine

## 2024-07-23 ENCOUNTER — Ambulatory Visit (INDEPENDENT_AMBULATORY_CARE_PROVIDER_SITE_OTHER): Admitting: Family Medicine

## 2024-07-23 VITALS — BP 86/50 | HR 76 | Ht 62.0 in | Wt 154.0 lb

## 2024-07-23 DIAGNOSIS — F5101 Primary insomnia: Secondary | ICD-10-CM | POA: Diagnosis not present

## 2024-07-23 DIAGNOSIS — E118 Type 2 diabetes mellitus with unspecified complications: Secondary | ICD-10-CM | POA: Diagnosis not present

## 2024-07-23 DIAGNOSIS — G2581 Restless legs syndrome: Secondary | ICD-10-CM | POA: Diagnosis not present

## 2024-07-23 DIAGNOSIS — I952 Hypotension due to drugs: Secondary | ICD-10-CM | POA: Diagnosis not present

## 2024-07-23 LAB — POCT GLYCOSYLATED HEMOGLOBIN (HGB A1C): Hemoglobin A1C: 7.1 % — AB (ref 4.0–5.6)

## 2024-07-23 MED ORDER — ROPINIROLE HCL 2 MG PO TABS: 2.0000 mg | ORAL_TABLET | Freq: Every day | ORAL | 1 refills | Status: DC

## 2024-07-23 MED ORDER — GABAPENTIN 300 MG PO CAPS: 300.0000 mg | ORAL_CAPSULE | Freq: Every day | ORAL | 1 refills | Status: AC

## 2024-07-23 NOTE — Progress Notes (Signed)
 Established Patient Office Visit  Subjective  Patient ID: Jillian Huynh, female    DOB: [birth year]  Age: 78 y.o. MRN: [id number]  Chief Complaint  Patient presents with   restless leg    HPI Discussed the use of AI scribe software for clinical note transcription with the patient, who gave verbal consent to proceed.  History of Present Illness Jillian Huynh is a 78 year old female with restless leg syndrome and Parkinson's disease who presents for follow-up.  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 and causing emotional distress - Ropinirole  previously effective for restless leg syndrome and sleep, but became ineffective despite dose escalation - Transitioned to gabapentin , titrated from 100 mg to 300 mg nightly, but experienced significant sleep disturbances and remained awake all night without ropinirole  - Resumed 2 mg ropinirole  at night, resulting in improved sleep  Parkinsonian symptoms - Parkinson's disease present - Ropinirole  provides benefit for sleep in the context of Parkinson's disease  Hypotension and associated functional impairment - Blood pressure managed with Farxiga  and metoprolol  - Low blood pressure readings, sometimes as low as 80s/50s, with home readings in the 90s to 100s - Low energy levels and inability to cook, clean, or engage in activities due to hypotension - History of emergency room visit for hypotension, initiated by her daughter-in-law, a pharmacist  Cardiac monitoring - Received a letter from [employer] regarding a heart monitor, but is unsure who ordered it - No cardiology follow-up since hospitalization in December     ROS    Objective:     BP (!) 86/50   Pulse 76   Ht 5' 2 (1.575 m)   Wt 154 lb 0.6 oz (69.9 kg)   SpO2 99%   BMI 28.17 kg/m    Physical Exam Vitals and nursing note reviewed.  Constitutional:      Appearance: Normal appearance.   HENT:     Head: Normocephalic and atraumatic.  Eyes:     Conjunctiva/sclera: Conjunctivae normal.  Cardiovascular:     Rate and Rhythm: Normal rate and regular rhythm.  Pulmonary:     Effort: Pulmonary effort is normal.     Breath sounds: Normal breath sounds.  Skin:    General: Skin is warm and dry.  Neurological:     Mental Status: She is alert.  Psychiatric:        Mood and Affect: Mood normal.      Results for orders placed or performed in visit on 07/23/24  POCT HgB A1C  Result Value Ref Range   Hemoglobin A1C 7.1 (A) 4.0 - 5.6 %   HbA1c POC (<> result, manual entry)     HbA1c, POC (prediabetic range)     HbA1c, POC (controlled diabetic range)        The ASCVD Risk score (Arnett DK, et al., 2019) failed to calculate for the following reasons:   The valid systolic blood pressure range is 90 to 200 mmHg    Assessment & Plan:   Problem List Items Addressed This Visit       Endocrine   Diabetes mellitus type 2, controlled (HCC)   Currently on semaglutide  and doing well.  Weight is stable at 154 pounds with a BMI of 28 now.  Due for A1c today last A1c was 7.2.      Relevant Orders   POCT HgB A1C (Completed)     Other   Restless leg - Primary  Relevant Medications   rOPINIRole  (REQUIP ) 2 MG tablet   gabapentin  (NEURONTIN ) 300 MG capsule   Insomnia   Relevant Medications   rOPINIRole  (REQUIP ) 2 MG tablet   Other Visit Diagnoses       Hypotension due to drugs          Assessment and Plan Assessment & Plan Restless legs syndrome Gabapentin  300 mg effective for restless legs but not sleep. Ropinirole  2 mg effective for sleep. - Prescribe gabapentin  300 mg for restless legs syndrome. - Prescribe ropinirole  2 mg for sleep.  Hypotension due to drugs Hypotension likely related to medication. Home blood pressure readings low, occasionally 80s/50s. Discussed Farxiga  dosage adjustment. - Instruct to split Farxiga  dose in half or switch to 5 mg if  available. - Monitor blood pressure at home.  Parkinson's disease Symptoms may be exacerbated by hypotension. - Consider discussing medication management with neurologist if symptoms persist.  Unclear who ordered [employer] patch , take to visit with Dr. KATHEE Shallow.    Return in about 4 months (around 11/22/2024) for Diabetes follow-up.    Dorothyann Byars, MD

## 2024-07-23 NOTE — Assessment & Plan Note (Signed)
 Currently on semaglutide  and doing well.  Weight is stable at 154 pounds with a BMI of 28 now.  Due for A1c today last A1c was 7.2.

## 2024-07-23 NOTE — Patient Instructions (Addendum)
 Please cut the Farxiga  in half and just take half a tab once a day. Continue taking half a tab once a day of the metoprolol  until you at least see Dr. Pietro back. Take [employer] patch heart monitor with you.

## 2024-07-23 NOTE — Progress Notes (Signed)
 Pt reports that since she started taking the Gabapentin  for the RLS this has worked really well however she hasn't been able to sleep and has become really anxious. She stated that she restarted the Ropinirole  2 mg 2 weeks ago and this has helped her to be able to sleep.   She also c/o being really dizzy she stated that her BP is too low and she is tired all the time. She is currently taking Metoprolol  succinate 12.5 and Entresto  24-26 mg 1 tablet BID.

## 2024-07-24 NOTE — Progress Notes (Signed)
 HPI: FU CHF. ABIs October 2022 normal. Echocardiogram November 2022 showed septal hypokinesis, ejection fraction 45 to 50%, moderate left ventricular hypertrophy, grade 1 diastolic dysfunction, moderate mitral regurgitation, mild aortic insufficiency. Cardiac CTA November 2022 showed calcium  score 162 which was 70th percentile and minimal nonobstructive disease in the LAD and circumflex.  Patient was initiated on medical therapy.  Cardiac MRI August 2023 showed ejection fraction 49% with septal/lateral dyssynchrony consistent with left bundle branch block, normal RV function.  Echocardiogram repeated November 2023 and showed ejection fraction 50 to 55%, moderate left ventricular hypertrophy, grade 1 diastolic dysfunction, mild aortic insufficiency.  Had wide-complex tachycardia in December 2024 in [place] .  Converted with IV amiodarone .  Seen by Dr. Nancey and felt likely SVT with underlying left bundle branch block.  Monitor February 2025 showed sinus rhythm with 6 beats of nonsustained ventricular tachycardia, short runs of SVT and rare ectopy.  There was concern that rhythm may have been atrial flutter based on patient's heart rate.  She was started on apixaban .  Appears plan eventually was to insert implantable loop monitor.  Since last seen, she denies dyspnea, chest pain or syncope.  She has noticed increased weakness and blood pressure running low.  She has lost 35 pounds recently with semaglutide .  Current Outpatient Medications  Medication Sig Dispense Refill   apixaban  (ELIQUIS ) 5 MG TABS tablet Take 1 tablet (5 mg total) by mouth 2 (two) times daily. 60 tablet 11   atorvastatin  (LIPITOR) 40 MG tablet Take 1 tablet (40 mg total) by mouth daily. 90 tablet 3   B-D ULTRAFINE III SHORT PEN 31G X 8 MM MISC SUB-Q Daily 100 each 4   Calcium  Carbonate-Vit D-Min (CALCIUM  1200 PO) Take 1 tablet by mouth in the morning and at bedtime.     carbidopa-levodopa (SINEMET IR) 25-100 MG tablet Take 1  tablet by mouth 3 (three) times daily.     Continuous Glucose Sensor (FREESTYLE LIBRE 3 PLUS SENSOR) MISC Change sensor every 15 days. 6 each 4   dapagliflozin  propanediol (FARXIGA ) 5 MG TABS tablet Take 5 mg by mouth daily.     docusate sodium (COLACE) 100 MG capsule Take 100 mg by mouth daily.     DULoxetine  (CYMBALTA ) 30 MG capsule Take 1 capsule by mouth once daily 90 capsule 1   ezetimibe  (ZETIA ) 10 MG tablet Take 1 tablet by mouth once daily 90 tablet 3   fish oil-omega-3 fatty acids 1000 MG capsule Take 1 g by mouth daily.     gabapentin  (NEURONTIN ) 300 MG capsule Take 1 capsule (300 mg total) by mouth at bedtime. For Restless Leg 90 capsule 1   Magnesium 400 MG TABS Take 1 tablet by mouth daily.     Multiple Vitamin (MULTIVITAMIN WITH MINERALS) TABS tablet Take 1 tablet by mouth daily.     omeprazole (PRILOSEC) 20 MG capsule Take 20 mg by mouth daily.     Polyethylene Glycol 3350 POWD Take 1 Scoop by mouth daily.     rOPINIRole  (REQUIP ) 2 MG tablet Take 1 tablet (2 mg total) by mouth at bedtime. 90 tablet 1   sacubitril -valsartan  (ENTRESTO ) 24-26 MG Take 1 tablet by mouth twice daily 180 tablet 1   Semaglutide , 1 MG/DOSE, 4 MG/3ML SOPN Inject 1 mg into the skin once a week. Do NOT bill insurance, please bill manufacturer provider voucher BIN  [id number] PCN [id number] Grp [id number] ID  [id number] 3 mL 0   metoprolol  succinate (TOPROL  XL) 25 MG 24  hr tablet Take 0.5 tablets (12.5 mg total) by mouth daily. (Patient not taking: Reported on 07/27/2024) 45 tablet 3   No current facility-administered medications for this visit.     Past Medical History:  Diagnosis Date   Diabetes mellitus without complication (HCC)    Hyperlipidemia    Hypertension     Past Surgical History:  Procedure Laterality Date   CHOLECYSTECTOMY      Social History   Socioeconomic History   Marital status: Married    Spouse name: Social research officer, government   Number of children: 3   Years of education: 16.5   Highest  education level: Some college, no degree  Occupational History   Occupation: Social worker    Comment: part-time  Tobacco Use   Smoking status: Never   Smokeless tobacco: Never  Vaping Use   Vaping status: Never Used  Substance and Sexual Activity   Alcohol use: No    Alcohol/week: 0.0 standard drinks of alcohol   Drug use: No   Sexual activity: Yes    Partners: Male  Other Topics Concern   Not on file  Social History Narrative   Lives with her husband. She is working full time as a Social worker. Two of her children live close by. One is in [place] . One of her daughter's is deceased. Her hobbies include watch tv, listening to music and reading.    Social Drivers of Corporate investment banker Strain: Low Risk  (09/19/2022)   Overall Financial Resource Strain (CARDIA)    Difficulty of Paying Living Expenses: Not hard at all  Food Insecurity: No Food Insecurity (09/19/2022)   Hunger Vital Sign    Worried About Running Out of Food in the Last Year: Never true    Ran Out of Food in the Last Year: Never true  Transportation Needs: No Transportation Needs (09/19/2022)   PRAPARE - Administrator, Civil Service (Medical): No    Lack of Transportation (Non-Medical): No  Physical Activity: Inactive (09/19/2022)   Exercise Vital Sign    Days of Exercise per Week: 0 days    Minutes of Exercise per Session: 0 min  Stress: No Stress Concern Present (07/22/2021)   [employer] of Occupational Health - Occupational Stress Questionnaire    Feeling of Stress : Not at all  Social Connections: Moderately Integrated (09/19/2022)   Social Connection and Isolation Panel    Frequency of Communication with Friends and Family: More than three times a week    Frequency of Social Gatherings with Friends and Family: More than three times a week    Attends Religious Services: Never    Database administrator or Organizations: Yes    Attends Engineer, structural: More than 4 times  per year    Marital Status: Married  Catering manager Violence: Not At Risk (09/19/2022)   Humiliation, Afraid, Rape, and Kick questionnaire    Fear of Current or Ex-Partner: No    Emotionally Abused: No    Physically Abused: No    Sexually Abused: No    Family History  Problem Relation Age of Onset   Diabetes Father    Cancer Father 88       Esophogeal Ca., Lung.   Hypertension Other    Thyroid  disease Other    Colon cancer Daughter 37   Glaucoma Mother    Hepatitis C Mother 26       Died from.   Glaucoma Sister    Glaucoma Sister  Glaucoma Maternal Aunt     ROS: no fevers or chills, productive cough, hemoptysis, dysphasia, odynophagia, melena, hematochezia, dysuria, hematuria, rash, seizure activity, orthopnea, PND, pedal edema, claudication. Remaining systems are negative.  Physical Exam: Well-developed well-nourished in no acute distress.  Skin is warm and dry.  HEENT is normal.  Neck is supple.  Chest is clear to auscultation with normal expansion.  Cardiovascular exam is regular rate and rhythm.  Abdominal exam nontender or distended. No masses palpated. Extremities show no edema. neuro grossly intact  ECG- personally reviewed  A/P  1 nonischemic cardiomyopathy-LV function has improved to low normal on most recent echocardiogram.  Will repeat study.  This is felt possibly secondary to dyssynchrony/left bundle branch block.  Previous CTA showed no coronary disease.  Continue Toprol , Entresto , Farxiga  and spironolactone .  2 chronic combined systolic/diastolic congestive heart failure-patient appears to be euvolemic today.  Will continue Farxiga  at present dose.  Continue low-sodium diet.  3 mild coronary artery disease-noted on prior CTA.  Continue statin.  4 aortic insufficiency-mild on most recent echocardiogram.  5 hypertension-blood pressure is low.  She has lost 35 pounds recently likely contributing to her hypotension.  Discontinue Entresto  and Toprol  and  follow blood pressure.  6 hyperlipidemia-continue statin.  7 history of sleep apnea-she did not tolerate CPAP.  8 history of wide-complex tachycardia-this is felt likely SVT or atrial flutter with underlying left bundle branch block.  Continue apixaban  (Dr. Nancey felt this was possibly atrial flutter).  Note his most recent document states that if atrial flutter not noted on follow-up monitor apixaban  could likely be discontinued.  There was also the potential for implantable loop monitor to assess for atrial fibrillation or flutter.  I will arrange follow-up with him as she may need implantable loop.  Redell Shallow, MD

## 2024-07-27 ENCOUNTER — Ambulatory Visit: Admitting: Cardiology

## 2024-07-27 ENCOUNTER — Encounter: Payer: Self-pay | Admitting: Cardiology

## 2024-07-27 VITALS — BP 82/55 | HR 81 | Wt 155.1 lb

## 2024-07-27 DIAGNOSIS — I5032 Chronic diastolic (congestive) heart failure: Secondary | ICD-10-CM

## 2024-07-27 DIAGNOSIS — I2510 Atherosclerotic heart disease of native coronary artery without angina pectoris: Secondary | ICD-10-CM

## 2024-07-27 DIAGNOSIS — I447 Left bundle-branch block, unspecified: Secondary | ICD-10-CM | POA: Diagnosis not present

## 2024-07-27 DIAGNOSIS — I10 Essential (primary) hypertension: Secondary | ICD-10-CM | POA: Diagnosis not present

## 2024-07-27 NOTE — Patient Instructions (Signed)
 Medication Instructions:   STOP ENTRESTO  AND METOPROLOL   *If you need a refill on your cardiac medications before your next appointment, please call your pharmacy*   Follow-Up: At [facility], you and your health needs are our priority.  As part of our continuing mission to provide you with exceptional heart care, our providers are all part of one team.  This team includes your primary Cardiologist (physician) and Advanced Practice Providers or APPs (Physician Assistants and Nurse Practitioners) who all work together to provide you with the care you need, when you need it.  Your next appointment:   6 month(s)  Provider:   Redell Shallow, MD

## 2024-07-27 NOTE — Progress Notes (Unsigned)
 Electrophysiology Office Note:   Date:  07/28/2024  ID:  Jillian Huynh Section, DOB [birth year], MRN [id number]  Primary Cardiologist: None Primary Heart Failure: None Electrophysiologist: Eulas FORBES Furbish, MD      History of Present Illness:   Jillian Huynh is a 78 y.o. female with h/o LBBB, CAD (elevated CAC), HFrecEF, HTN, HTN, DM II, GERD, OSA, depression, Parkinson's Disease seen today for routine electrophysiology followup.   Since last being seen in our clinic the patient reports she saw Dr. Pietro yesterday and her metoprolol  / Entresto  were stopped due to low BP.  She has been taking Ozempic  and has lost ~ 35 lbs.  She does not eat and drink well. She reports she has been dizzy.  She indicates she has not had any further fast HR's and would like to come off her Eliquis  if possible. Previously had discussed ILR with Dr. Furbish and she had declined at that time.    She denies chest pain, palpitations, dyspnea, PND, orthopnea, nausea, vomiting, dizziness, syncope, edema, weight gain, or early satiety.   Review of systems complete and found to be negative unless listed in HPI.   EP Information / Studies Reviewed:    EKG is not ordered today. EKG from 11/28/2023 reviewed which showed NSR, LBBB 74 bpm        Arrhythmia / AAD / Pertinent EP Studies WCT  cMRI 06/2022 > normal LV size with mild focal basal septal hypertrophy, no evidence for significant LVOT gradient or MV SAM, LVEF 49%, mild diffuse hypokinesis with septal-lateral dyssynchrony c/w LBBB, normal RV size / systolic function RVEF 51%, possible subtle mid-wall LGE at the lateral wall base, could be suggestive of prior myocarditis  Cardiac Monitor 12/2023 > SR 54-100 bpm, ave 72 bpm, brief NSVT with longest episode 6 beats, episodes of SVT, most likely atrial runs, the longest 14 seconds, rare ectopy <1%, no symptoms reported    Risk Assessment/Calculations:    CHA2DS2-VASc Score = 7   This indicates a 11.2% annual risk of  stroke. The patient's score is based upon: CHF History: 1 HTN History: 1 Diabetes History: 1 Stroke History: 0 Vascular Disease History: 1 Age Score: 2 Gender Score: 1             Physical Exam:   VS:  BP 92/64   Pulse 80   Ht 5' 2 (1.575 m)   Wt 155 lb 3.2 oz (70.4 kg)   SpO2 98%   BMI 28.39 kg/m    Wt Readings from Last 3 Encounters:  07/28/24 155 lb 3.2 oz (70.4 kg)  07/27/24 155 lb 1.3 oz (70.3 kg)  07/23/24 154 lb 0.6 oz (69.9 kg)     GEN: Well nourished, well developed in no acute distress NECK: No JVD; No carotid bruits CARDIAC: Regular rate and rhythm, no murmurs, rubs, gallops RESPIRATORY:  Clear to auscultation without rales, wheezing or rhonchi  ABDOMEN: Soft, non-tender, non-distended EXTREMITIES:  No edema; No deformity   ASSESSMENT AND PLAN:    WCT  Similar morphology & axis to underlying LBBB, no clear evidence of A-V dissociation, Wellens criteria not met for VT. Suspected to be possible AFL with underlying LBBB -metoprolol  stopped due to low BP on 07/27/24 per Cardiology  -no recurrence of symptoms / palpitations    -continue OAC for stroke prophylaxis for now > pt would like to come off agents if possible, previously had discussed coming off if she had adequate monitoring with ILR -reviewed ILR insertion, risks / benefits with  patient and husband, she would like to move forward with implant  Secondary Hypercoagulable State  -continue Eliquis  5mg  BID, dose reviewed and appropriate by age / wt   NICM / HFrecEF  -euvolemic on exam  -Metoprolol  / Entresto  stopped due to low BP   CAD  Elevated CAC -no anginal symptoms     OSA  -intolerant of CPAP   Parkinson's Disease  -dx 2025, started on carbidopa-levodopa    Follow up with Dr. Mealor at next available appt for possible ILR implant.  Will begin pre-cert process.    Signed, Daphne Barrack, NP-C, AGACNP-BC [facility] - Electrophysiology  07/28/2024, 2:04 PM

## 2024-07-28 ENCOUNTER — Ambulatory Visit: Attending: Pulmonary Disease | Admitting: Pulmonary Disease

## 2024-07-28 ENCOUNTER — Encounter: Payer: Self-pay | Admitting: Pulmonary Disease

## 2024-07-28 VITALS — BP 92/64 | HR 80 | Ht 62.0 in | Wt 155.2 lb

## 2024-07-28 DIAGNOSIS — G4733 Obstructive sleep apnea (adult) (pediatric): Secondary | ICD-10-CM | POA: Diagnosis not present

## 2024-07-28 DIAGNOSIS — R000 Tachycardia, unspecified: Secondary | ICD-10-CM | POA: Diagnosis not present

## 2024-07-28 DIAGNOSIS — I2510 Atherosclerotic heart disease of native coronary artery without angina pectoris: Secondary | ICD-10-CM | POA: Diagnosis not present

## 2024-07-28 DIAGNOSIS — D6869 Other thrombophilia: Secondary | ICD-10-CM

## 2024-07-28 NOTE — Patient Instructions (Signed)
 Medication Instructions:  Your physician recommends that you continue on your current medications as directed. Please refer to the Current Medication list given to you today.  *If you need a refill on your cardiac medications before your next appointment, please call your pharmacy*  Lab Work: None ordered If you have labs (blood work) drawn today and your tests are completely normal, you will receive your results only by: MyChart Message (if you have MyChart) OR A paper copy in the mail If you have any lab test that is abnormal or we need to change your treatment, we will call you to review the results.  Follow-Up: At [facility], you and your health needs are our priority.  As part of our continuing mission to provide you with exceptional heart care, our providers are all part of one team.  This team includes your primary Cardiologist (physician) and Advanced Practice Providers or APPs (Physician Assistants and Nurse Practitioners) who all work together to provide you with the care you need, when you need it.  Your next appointment:   1-2 month(s)  Provider:   Eulas Furbish, MD for loop implantation  Other instructions: 1.Be sure to stay hydrated 2.Check your blood pressure daily for the next few weeks

## 2024-07-30 ENCOUNTER — Ambulatory Visit: Admitting: Family Medicine

## 2024-08-07 ENCOUNTER — Telehealth: Payer: Self-pay

## 2024-08-07 NOTE — Telephone Encounter (Signed)
 PAP: Patient assistance application for Farxiga  through [employer] (AZ&Me) has been mailed to pt's home address on file. Provider portion of application will be faxed to provider's office.For re-enrollment 2026.

## 2024-08-10 ENCOUNTER — Other Ambulatory Visit: Payer: Self-pay | Admitting: Cardiology

## 2024-08-12 ENCOUNTER — Ambulatory Visit: Admitting: Cardiology

## 2024-08-18 NOTE — Telephone Encounter (Signed)
 Received provider portion of PAP application Farxiga  (AZ&ME) for 2026.

## 2024-08-19 NOTE — Telephone Encounter (Signed)
 PAP: Application for Farxiga  has been submitted to [employer] (AZ&Me), via fax FOR 2026 RENEWAL

## 2024-08-25 ENCOUNTER — Ambulatory Visit: Attending: Cardiovascular Disease | Admitting: Cardiovascular Disease

## 2024-08-25 ENCOUNTER — Encounter: Payer: Self-pay | Admitting: Cardiovascular Disease

## 2024-08-25 ENCOUNTER — Telehealth: Payer: Self-pay

## 2024-08-25 ENCOUNTER — Other Ambulatory Visit: Payer: Self-pay | Admitting: *Deleted

## 2024-08-25 VITALS — BP 108/60 | HR 72 | Ht 62.0 in | Wt 152.0 lb

## 2024-08-25 DIAGNOSIS — I429 Cardiomyopathy, unspecified: Secondary | ICD-10-CM | POA: Diagnosis not present

## 2024-08-25 DIAGNOSIS — R000 Tachycardia, unspecified: Secondary | ICD-10-CM

## 2024-08-25 DIAGNOSIS — I5022 Chronic systolic (congestive) heart failure: Secondary | ICD-10-CM | POA: Diagnosis not present

## 2024-08-25 DIAGNOSIS — I447 Left bundle-branch block, unspecified: Secondary | ICD-10-CM

## 2024-08-25 DIAGNOSIS — F321 Major depressive disorder, single episode, moderate: Secondary | ICD-10-CM

## 2024-08-25 MED ORDER — DULOXETINE HCL 30 MG PO CPEP: 30.0000 mg | ORAL_CAPSULE | Freq: Every day | ORAL | 1 refills | Status: AC

## 2024-08-25 NOTE — Progress Notes (Signed)
 Electrophysiology Office Note:    Date:  08/25/2024   ID:  Jillian Huynh, DOB 10-27-1946, MRN [id number]  PCP:  Alvan Dorothyann BIRCH, MD   [facility] Providers Cardiologist:  None Electrophysiologist:  Jillian FORBES Furbish, MD  Sleep Medicine:  Jillian Bihari, MD     Referring MD: Alvan Dorothyann BIRCH, *   History of Present Illness:    Jillian Huynh is a 78 y.o. female with a medical history significant for bundle-branch block, CAD (elevated CAC), recovered EF, diabetes referred for evaluation of arrhythmia.     She presented to [place]  [facility] with tachycardia with wide-complex QRS.  This occurred while traveling from [place]  to [place]  on 12/22 when she began to experience chest pressure and rapid heart rates.  She converted to sinus rhythm after receiving IV amiodarone .   Since seeing me, a 14-day monitor was placed that showed brief wide-complex and narrow complex episodes, the longest was 14 beats.  She followed up with Jillian Huynh.  Suspicion is that she likely has atrial flutter with underlying left bundle branch block.  She has been maintained on anticoagulation for stroke prophylaxis but has been having difficulty obtaining Eliquis .  The possibility of having a loop recorder inserted was discussed.     Today, she reports that she is fine, at baseline.  No palpitations, no presyncope.  EKGs/Labs/Other Studies Reviewed Today:     Monitors:  14 day monitor, 01/2024 Sinus rhythm HR 54-100, avg 72 Brief NSVT episodes occurred, longest was 6 beats Episodes of SVT occurred, most likely atrial runs, the longest was 14 seconds Ectopy was rare, < 1% No symptoms reported  Echocardiogram:  TTE November 2023 EF 50 to 55%.  Moderate LVH.  Grade 1 diastolic dysfunction.  Mild aortic regurgitation, trivial mitral regurgitation.  Cannot exclude small PFO.  Normal biatrial sizes.  TTE November 2022 LVEF 45 to 50%   Advanced imaging:  Cardiac MRI August  2023 Normal LV size with mild focal basal septal hypertrophy.  No evidence for LVOT gradient or mitral valve SAM.  LVEF 49%.  Mild diffuse hypokinesis with septal lateral dyssynchrony consistent with a left bundle branch block.  Possible subtle mid wall LGE at the lateral wall base.   EKG:   EKG Interpretation Date/Time:  Tuesday August 25 2024 12:01:36 EDT Ventricular Rate:  72 PR Interval:  198 QRS Duration:  138 QT Interval:  424 QTC Calculation: 464 R Axis:   52  Text Interpretation: Normal sinus rhythm Non-specific intra-ventricular conduction block T wave abnormality, consider lateral ischemia When compared with ECG of 28-Nov-2023 10:20, No significant change was found Confirmed by Huynh Jillian [phone]) on 08/25/2024 12:08:39 PM     Physical Exam:    VS:  BP 108/60 (BP Location: Left Arm, Patient Position: Sitting, Cuff Size: Large)   Pulse 72   Ht 5' 2 (1.575 m)   Wt 152 lb (68.9 kg)   SpO2 99%   BMI 27.80 kg/m     Wt Readings from Last 3 Encounters:  08/25/24 152 lb (68.9 kg)  07/28/24 155 lb 3.2 oz (70.4 kg)  07/27/24 155 lb 1.3 oz (70.3 kg)     GEN: Well nourished, well developed in no acute distress CARDIAC: RRR, no murmurs, rubs, gallops RESPIRATORY:  Normal work of breathing MUSCULOSKELETAL: no edema    ASSESSMENT & PLAN:     Wide-complex tachycardia Similar morphology and axis to underlying left bundle branch block I do not see clear evidence of A-V dissociation Wellens  criteria for VT not met Suspect SVT -- possibly flutter based upon rate - with underlying LBBB We discussed management options including EP study to define and possibly correct the arrhythmia vs loop recorder to monitor her arrhythmia burden. If her arrhythmia is rare, we may not need to consider an invasive study She will continue metoprolol  25 mg twice daily Will place loop recorder today for arrhythmia management.  I described the rationale for and the logistics of the  procedure.  I explained risks including infection and bleeding.  I explained the monitoring process with the associated fee.  She like to proceed   Nonischemic cardiomyopathy History of NICM, EF recovered  Coronary artery disease Detected on CTA No obstructive disease  Obstructive sleep apnea She did not tolerate CPAP      PREPROCEDURE DIAGNOSIS:  Arrhythmia management    POSTPROCEDURE DIAGNOSIS: Arrhythmia management     PROCEDURES:   1. Implantable loop recorder implantation    INTRODUCTION:  Jillian Huynh presents with a history of palpitations The costs of loop recorder monitoring have been discussed with the patient.    DESCRIPTION OF PROCEDURE:  Informed written consent was obtained.  A timeout was performed. The patient required no sedation for the procedure today. The patients left chest was prepped and draped in the usual sterile fashion. The skin overlying the left parasternal region was infiltrated with lidocaine for local analgesia.  A 0.5-cm incision was made over the left parasternal region over the 3rd intercostal space.  A subcutaneous ILR pocket was fashioned using a combination of sharp and blunt dissection.  A [employer] LUX implantable loop recorder was then placed into the pocket  R waves were very prominent and measured >0.3mV.  Steri- Strips and a sterile dressing were then applied.  There were no early apparent complications.     CONCLUSIONS:   1. Successful implantation of a implantable loop recorder for a history of tachycardia  2. No early apparent complications.   Jillian FORBES Furbish, MD  Cardiac Electrophysiology      Signed, Jillian FORBES Furbish, MD  08/25/2024 12:08 PM    [facility]

## 2024-08-25 NOTE — Progress Notes (Signed)
   08/25/2024  Patient ID: Inocente Section, female   DOB: [birth year], 78 y.o.   MRN: [id number]  Missed call /voicemail from patient stating she will soon need a refill on her Libre 3+ sensors.  It appears these were last filled through [facility], because Part B will no longer cover since she is not using insulin .  Contacted [facility] to verify a refill can be processed at this time.  Sensors are actually ready for pick up at $0 copay.  Called patient to let her know, but I had to leave a message.  Channing DELENA Mealing, PharmD, DPLA

## 2024-08-25 NOTE — Patient Instructions (Signed)
Medication Instructions:  Your physician recommends that you continue on your current medications as directed. Please refer to the Current Medication list given to you today.  Labwork: None ordered.  Testing/Procedures: None ordered.  Follow-Up:  Your physician wants you to follow-up in: one year with Dr. Mealor.  You will receive a reminder letter in the mail two months in advance. If you don't receive a letter, please call our office to schedule the follow-up appointment.    Implantable Loop Recorder Placement, Care After This sheet gives you information about how to care for yourself after your procedure. Your health care provider may also give you more specific instructions. If you have problems or questions, contact your health care provider. What can I expect after the procedure? After the procedure, it is common to have: Soreness or discomfort near the incision. Some swelling or bruising near the incision.  Follow these instructions at home: Incision care  Monitor your cardiac device site for redness, swelling, and drainage. Call the device clinic at [phone] if you experience these symptoms or fever/chills.  Keep the large square bandage on your site for 24 hours and then you may remove it yourself. Keep the steri-strips underneath in place.   You may shower after 72 hours / 3 days from your procedure with the steri-strips in place. They will usually fall off on their own, or may be removed after 10 days. Pat dry.   Avoid lotions, ointments, or perfumes over your incision until it is well-healed.  Please do not submerge in water until your site is completely healed.   Your device is MRI compatible.   Remote monitoring is used to monitor your cardiac device from home. This monitoring is scheduled every month by our office. It allows us to keep an eye on the function of your device to ensure it is working properly.  If your wound site starts to bleed apply pressure.     For help with the monitor please call [employer] Monitor Support Specialist directly at [phone].    If you have any questions/concerns please call the device clinic at [phone].  Activity  Return to your normal activities.  General instructions Follow instructions from your health care provider about how to manage your implantable loop recorder and transmit the information. Learn how to activate a recording if this is necessary for your type of device. You may go through a metal detection gate, and you may let someone hold a metal detector over your chest. Show your ID card if needed. Do not have an MRI unless you check with your health care provider first. Take over-the-counter and prescription medicines only as told by your health care provider. Keep all follow-up visits as told by your health care provider. This is important. Contact a health care provider if: You have redness, swelling, or pain around your incision. You have a fever. You have pain that is not relieved by your pain medicine. You have triggered your device because of fainting (syncope) or because of a heartbeat that feels like it is racing, slow, fluttering, or skipping (palpitations). Get help right away if you have: Chest pain. Difficulty breathing. Summary After the procedure, it is common to have soreness or discomfort near the incision. Change your dressing as told by your health care provider. Follow instructions from your health care provider about how to manage your implantable loop recorder and transmit the information. Keep all follow-up visits as told by your health care provider. This is important. This information   is not intended to replace advice given to you by your health care provider. Make sure you discuss any questions you have with your health care provider. Document Released: 10/10/2015 Document Revised: 12/14/2017 Document Reviewed: 12/14/2017 Elsevier Patient Education  2020 Elsevier  Inc.  

## 2024-08-28 NOTE — Telephone Encounter (Signed)
 Patient portion of PAP application is in media for 2026 renewal Farxiga  (AZ&ME)

## 2024-08-31 ENCOUNTER — Other Ambulatory Visit (HOSPITAL_COMMUNITY): Payer: Self-pay

## 2024-09-02 ENCOUNTER — Other Ambulatory Visit (HOSPITAL_COMMUNITY): Payer: Self-pay

## 2024-09-02 NOTE — Telephone Encounter (Signed)
 PAP: Patient assistance application for Farxiga  has been approved by PAP Companies: AZ[employer] from 11/12/2024 to 11/11/2025. Medication should be delivered to PAP Delivery: Home. For further shipping updates, please contact [employer] ([employer]) at [phone]. Patient ID is: not provided.

## 2024-09-10 ENCOUNTER — Other Ambulatory Visit: Payer: Self-pay | Admitting: Cardiology

## 2024-09-10 DIAGNOSIS — E7800 Pure hypercholesterolemia, unspecified: Secondary | ICD-10-CM

## 2024-09-11 ENCOUNTER — Telehealth: Payer: Self-pay

## 2024-09-11 NOTE — Telephone Encounter (Signed)
 Received Ozempic  1mg   -4 boxes ( one pen per box )  From patient assistance  Patient informed and will pick up the prescription at her convenience.

## 2024-09-14 NOTE — Progress Notes (Unsigned)
   09/15/2024  Patient ID: Jillian Huynh, female   DOB: [birth year], 78 y.o.   MRN: [id number]  Subjective/Objective Telephone visit to follow-up on management of diabetes and blood pressure   Diabetes -Current medications:  Ozempic  1mg  weekly, Farxiga  5mg  daily  -A1c in September was 7.1% -Using Libre 3+ for CGM and states FBG is averaging around 130 even since Farxiga  was recently decreased to 5mg  daily (due to hypotension) -Had some 5mg  Farxiga  tablets at home, so she is no having to split tablets -Does not endorse any s/sx of hypoglycemia -Receives Farxiga  and Ozempic  through patient assistance programs- currently has 1.5 Ozempic  1mg  pens on hand and 4 are ready to pick up at [facility]             Hypotension -Current medications:  metoprolol  xl 12.5mg  daily and Entresto  24/26mg  BID stopped due to hypotension  -She does check home BP on occasion and states this is now averaging 130/70 -S/sx of hypotension have resolved   Restless Leg Syndrome -Current medications:  gabapentin  300mg  at bedtime, ropinorole 2mg  at bedtime -Patient was recently started on gabapentin  and ropinorole was tapered off.  Gabapentin  has helped with RLS, but ropinorole was resumed to help with sleep- she endorses this combination is working well  Hyperlipidemia -Current medications:  atorvastatin  40mg  daily, ezetimibe  10mg  daily -LDL 101 and TG 262 03/31/24   Assessment/Plan   Diabetes -Continue current regimen and CGM -Order pending for Farxiga  5mg  daily to go to [facility] (pharmacy for [employer]&Me PAP), so patient will not have to split 10mg  tablets once she runs out of 5mg  tablets on hand -Patient is signing up for [employer] plan for 2026, which should cover Ozempic  at no cost; so she should not be affected by upcoming Novo PAP changes   Hypotension -BP controlled based on home readings -Continue to monitor BP regularly   Restless Leg Syndrome -Controlled -Continue current regimen at this  time  Hyperlipidemia -Continue atorvastatin  40mg  daily and ezetimibe  10mg  daily -Recommend follow-up Lipid panel at next PCP visit; if LDL remains >70, could consider increasing atorvastatin  to 80mg  daily or changing to rosuvastatin 40mg  daily   Follow-up:  1/5   Channing DELENA Mealing, PharmD, DPLA

## 2024-09-15 ENCOUNTER — Other Ambulatory Visit: Payer: Self-pay

## 2024-09-15 ENCOUNTER — Telehealth: Payer: Self-pay | Admitting: Family Medicine

## 2024-09-15 DIAGNOSIS — E119 Type 2 diabetes mellitus without complications: Secondary | ICD-10-CM

## 2024-09-15 DIAGNOSIS — G2581 Restless legs syndrome: Secondary | ICD-10-CM

## 2024-09-15 MED ORDER — DAPAGLIFLOZIN PROPANEDIOL 5 MG PO TABS: 5.0000 mg | ORAL_TABLET | Freq: Every day | ORAL | 4 refills | Status: AC

## 2024-09-15 NOTE — Telephone Encounter (Signed)
 Chronic Condition Verificatioan Form dropped off by patient's husband. Form placed in Dr's box.

## 2024-09-23 NOTE — Telephone Encounter (Signed)
 Patients spouse came into office to drop off Ozempic , logged in white binder, thanks.

## 2024-09-25 ENCOUNTER — Ambulatory Visit: Attending: Cardiovascular Disease

## 2024-09-25 DIAGNOSIS — I447 Left bundle-branch block, unspecified: Secondary | ICD-10-CM | POA: Diagnosis not present

## 2024-09-28 LAB — CUP PACEART REMOTE DEVICE CHECK
Date Time Interrogation Session: 20251114123600
Implantable Pulse Generator Implant Date: 20251014
Pulse Gen Serial Number: 162078

## 2024-09-29 NOTE — Progress Notes (Signed)
 Remote Loop Recorder Transmission

## 2024-09-30 NOTE — Telephone Encounter (Signed)
Forms faxed,confirmation received and scanned into chart.

## 2024-10-12 ENCOUNTER — Ambulatory Visit: Payer: Self-pay | Admitting: Cardiovascular Disease

## 2024-10-20 ENCOUNTER — Other Ambulatory Visit: Payer: Self-pay | Admitting: Family Medicine

## 2024-10-20 DIAGNOSIS — F5101 Primary insomnia: Secondary | ICD-10-CM

## 2024-10-20 DIAGNOSIS — G2581 Restless legs syndrome: Secondary | ICD-10-CM

## 2024-10-20 MED ORDER — ROPINIROLE HCL 2 MG PO TABS: 2.0000 mg | ORAL_TABLET | Freq: Every day | ORAL | 1 refills | Status: AC

## 2024-10-20 NOTE — Telephone Encounter (Signed)
 Copied from CRM [id number]. Topic: Clinical - Medication Refill >> Oct 20, 2024  8:36 AM Avram MATSU wrote: Medication: rOPINIRole  (REQUIP ) 2 MG tablet [[id number]]  Has the patient contacted their pharmacy? Yes (Agent: If no, request that the patient contact the pharmacy for the refill. If patient does not wish to contact the pharmacy document the reason why and proceed with request.) (Agent: If yes, when and what did the pharmacy advise?)  This is the patient's preferred pharmacy:  [facility] [address] [place] - [address] [place] [address] Phone: [phone] Fax: [phone]    Is this the correct pharmacy for this prescription? Yes If no, delete pharmacy and type the correct one.   Has the prescription been filled recently? No  Is the patient out of the medication? Yes  Has the patient been seen for an appointment in the last year OR does the patient have an upcoming appointment? yes  Can we respond through MyChart? No call or text  Agent: Please be advised that Rx refills may take up to 3 business days. We ask that you follow-up with your pharmacy.

## 2024-10-26 ENCOUNTER — Ambulatory Visit: Attending: Cardiovascular Disease

## 2024-10-26 DIAGNOSIS — I447 Left bundle-branch block, unspecified: Secondary | ICD-10-CM | POA: Diagnosis not present

## 2024-10-27 LAB — CUP PACEART REMOTE DEVICE CHECK
Date Time Interrogation Session: 20251215042700
Implantable Pulse Generator Implant Date: 20251014
Pulse Gen Serial Number: 162078

## 2024-10-30 NOTE — Progress Notes (Signed)
 Remote Loop Recorder Transmission

## 2024-11-11 ENCOUNTER — Ambulatory Visit: Payer: Self-pay | Admitting: Cardiovascular Disease

## 2024-11-13 NOTE — Progress Notes (Signed)
 "  11/16/24  Patient ID: Jillian Huynh Section, female   DOB: [birth year], 79 y.o.   MRN: [id number]  Subjective/Objective Telephone visit to follow-up on management of chronic conditions   Diabetes -Current medications:  Ozempic  1mg  weekly, Farxiga  5mg  daily  -Using Libre 3+ for CGM and states FBG was averaging around 130, but this has been increased some recently (does not provide numbers).  States appetite has increased, and she has been eating more bread. -Farxiga  decreased from 10mg  to 5mg  in November due to hypotension -Does not endorse any s/sx of hypoglycemia -Receives Farxiga  through [employer] and has been re-enrolled for 2026 -Will no longer receive Ozempic  through [employer], but [employer] plan should cover in full (diabetes and heart care plan).  Patient is interesting in increasing dose to 2mg  weekly to help with increased appetite of late.  -UACR 7 on 2/18   Blood Pressure Management -Current medications:  none -Metoprolol  xl 12.5mg  daily and Entresto  24/26mg  BID stopped due to hypotension  -She does check home BP on occasion and states this was 182/83 last night but back down to 120/56 this morning -No recent s/sx of hypotension   Restless Leg Syndrome -Current medications:  gabapentin  300mg  at bedtime, ropinorole 2mg  at bedtime -Patient was recently started on gabapentin  and ropinorole was tapered off, but ropinirole  had to be resumed at 2mg  HS to help with sleep.  Patient states this combination was working well for a while, but she is back to experiencing RLS symptoms as well has having trouble sleeping.  Hyperlipidemia -Current medications:  atorvastatin  40mg  daily, ezetimibe  10mg  daily  Wide-Complex Tachycardia -Current medications:  none -Patient was taking Eliquis  5mg  BID, but she has not taken since around October when loop recorder placed by EP.  She is not sure if she should remain off of medication or resume use.  Lab Results  Component Value Date   HGBA1C 7.1 (A) 07/23/2024    HGBA1C 7.2 (A) 03/31/2024   HGBA1C 7.2 (A) 12/31/2023      Component Value Date/Time   NA 139 12/31/2023 1208   K 4.8 12/31/2023 1208   CL 103 12/31/2023 1208   CO2 23 12/31/2023 1208   GLUCOSE 128 (H) 12/31/2023 1208   GLUCOSE 224 (H) 03/02/2023 2052   BUN 19 12/31/2023 1208   CREATININE 0.55 (L) 12/31/2023 1208   CREATININE 0.70 02/28/2023 1119   CALCIUM  10.1 12/31/2023 1208   CALCIUM  9.5 09/01/2014 0000   PROT 6.8 03/02/2023 2052   ALBUMIN 4.1 03/02/2023 2052   AST 20 03/02/2023 2052   ALT 20 03/02/2023 2052   ALKPHOS 61 03/02/2023 2052   BILITOT 1.3 (H) 03/02/2023 2052   EGFR 94 12/31/2023 1208   GFRNONAA >60 03/02/2023 2052   GFRNONAA 90 12/26/2020 1124      Component Value Date/Time   CHOL 191 03/31/2024 1428   TRIG 262 (H) 03/31/2024 1428   HDL 45 03/31/2024 1428   CHOLHDL 4.2 03/31/2024 1428   CHOLHDL 2.8 12/25/2021 1104   VLDL 25 12/11/2021 1244   LDLCALC 101 (H) 03/31/2024 1428   LDLCALC 44 12/25/2021 1104   LABVLDL 45 (H) 03/31/2024 1428      Assessment/Plan   Diabetes -A1c not at goal of <7% -UACR is at goal -Patient sees PCP again 1/12 and will be due for A1c, UACR, CMP and lipid panel -Consider increasing Ozempic  to 2mg  based on recent increase in appetite and carbohydrate cravings -Ozempic  order (for 1 or 2mg ) will need to be sent to patient's  [facility]- HTA heart and diabetes plan should cover in full -Continue Libre for CGM  Blood Pressure Management -BP appears to be fluctuating based on recent home readings -Advised patient to monitor 1-2x/daily and bring readings to upcoming PCP visit -Could consider resuming low dose Entresto  or metoprolol  or increasing Farxiga  back to 10mg  if needed for BP control   Restless Leg Syndrome -Uncontrolled at this time -Patient to discuss further at upcoming PCP visit  Hyperlipidemia -Continue atorvastatin  40mg  daily and ezetimibe  10mg  daily -Recommend follow-up Lipid panel at next PCP visit; if  LDL remains >70, could consider increasing atorvastatin  to 80mg  daily or changing to rosuvastatin 40mg  daily   Wide-Complex Tachycardia -I do not see notes indicating patient was to stop Eliquis , but loop recordings have not reflected any arrhythmia or tachycardia -Consulting Dr. Nancey to see if patient should resume Eliquis  5mg  BID and will follow-up with patient  Follow-up:  6 weeks   Channing DELENA Mealing, PharmD, DPLA  "

## 2024-11-16 ENCOUNTER — Other Ambulatory Visit (INDEPENDENT_AMBULATORY_CARE_PROVIDER_SITE_OTHER): Payer: Self-pay

## 2024-11-16 DIAGNOSIS — I10 Essential (primary) hypertension: Secondary | ICD-10-CM

## 2024-11-16 DIAGNOSIS — R000 Tachycardia, unspecified: Secondary | ICD-10-CM

## 2024-11-16 DIAGNOSIS — G2581 Restless legs syndrome: Secondary | ICD-10-CM

## 2024-11-16 DIAGNOSIS — E1159 Type 2 diabetes mellitus with other circulatory complications: Secondary | ICD-10-CM

## 2024-11-16 MED ORDER — SEMAGLUTIDE (2 MG/DOSE) 8 MG/3ML ~~LOC~~ SOPN: 2.0000 mg | PEN_INJECTOR | SUBCUTANEOUS | 1 refills | Status: AC

## 2024-11-16 NOTE — Progress Notes (Signed)
Meds ordered this encounter  °Medications  ° Semaglutide, 2 MG/DOSE, 8 MG/3ML SOPN  °  Sig: Inject 2 mg as directed once a week.  °  Dispense:  9 mL  °  Refill:  1  ° ° °

## 2024-11-16 NOTE — Addendum Note (Signed)
 Addended by: Jaton Eilers D on: 11/16/2024 12:53 PM   Modules accepted: Orders

## 2024-11-17 DIAGNOSIS — R000 Tachycardia, unspecified: Secondary | ICD-10-CM

## 2024-11-17 DIAGNOSIS — I447 Left bundle-branch block, unspecified: Secondary | ICD-10-CM

## 2024-11-17 NOTE — Progress Notes (Signed)
" ° °  11/17/2024  Patient ID: Jillian Huynh Section, female   DOB: 1946-07-04, 79 y.o.   MRN: [id number]  Dr. Nancey would like for patient to resume Eliquis  5mg  BID.  Contacted [facility], and refill is going through on patient's insurance for $62.25 (pharmacy states patient has a deductible that has not yet been met).  Patient is enrolled in [facility] for cardiomyopathy that should cover copay for medication; pharmacy has billed secondary to insurance, and this is now going through for $0.  Patient aware and will pick up and resume therapy.  She is also going to need refills on Libre 3+ sensors soon, so I have requested that [facility] also refill these.  Channing DELENA Mealing, PharmD, DPLA  "

## 2024-11-20 ENCOUNTER — Telehealth: Payer: Self-pay

## 2024-11-20 NOTE — Progress Notes (Signed)
 ERROR duplicate

## 2024-11-21 NOTE — Progress Notes (Signed)
" ° °  11/21/2024  Patient ID: Jillian Huynh Section, female   DOB: 1946/07/06, 79 y.o.   MRN: [id number]  Returning missed call/voicemail from patient in regard to [facility] 3+ sensor refill.  Patient located one additional sensor she had at home, and I informed her that refills are on file at her [facility] pharmacy; and it looks like the pharmacy has filled a 90 day supply on 1/6.  Channing DELENA Mealing, PharmD, DPLA  "

## 2024-11-23 ENCOUNTER — Encounter: Payer: Self-pay | Admitting: Family Medicine

## 2024-11-23 ENCOUNTER — Ambulatory Visit: Admitting: Family Medicine

## 2024-11-23 VITALS — BP 117/76 | HR 73 | Ht 62.0 in | Wt 154.0 lb

## 2024-11-23 DIAGNOSIS — G2581 Restless legs syndrome: Secondary | ICD-10-CM | POA: Diagnosis not present

## 2024-11-23 DIAGNOSIS — Z7984 Long term (current) use of oral hypoglycemic drugs: Secondary | ICD-10-CM | POA: Diagnosis not present

## 2024-11-23 DIAGNOSIS — G20A1 Parkinson's disease without dyskinesia, without mention of fluctuations: Secondary | ICD-10-CM

## 2024-11-23 DIAGNOSIS — I110 Hypertensive heart disease with heart failure: Secondary | ICD-10-CM | POA: Diagnosis not present

## 2024-11-23 DIAGNOSIS — E1159 Type 2 diabetes mellitus with other circulatory complications: Secondary | ICD-10-CM | POA: Diagnosis not present

## 2024-11-23 LAB — POCT GLYCOSYLATED HEMOGLOBIN (HGB A1C): Hemoglobin A1C: 7.6 % — AB (ref 4.0–5.6)

## 2024-11-23 MED ORDER — ROPINIROLE HCL 4 MG PO TABS: 4.0000 mg | ORAL_TABLET | Freq: Every day | ORAL | 0 refills | Status: AC

## 2024-11-23 NOTE — Assessment & Plan Note (Signed)
 Parkinson's disease Chronic condition with restless legs syndrome. Ropinirole  adjustment improved sleep, aiding overall Parkinson's management. - Continue gabapentin  and ropinirole .

## 2024-11-23 NOTE — Assessment & Plan Note (Signed)
 Restless legs syndrome Chronic condition with recent exacerbation managed by increasing ropinirole  to 4 mg, improving sleep quality. Discussed potential tolerance and dosage reduction after symptom control. - Continue ropinirole  4 mg as needed. - Consider reducing to 3 mg or 2 mg after symptom control. - Monitor for flare-ups and adjust dosage. - Consider gabapentin  adjustment if needed.

## 2024-11-23 NOTE — Assessment & Plan Note (Signed)
" °  Type 2 diabetes mellitus with circulatory complications Recent increase in blood glucose likely due to appetite and stress from sleep disturbances. A1c is 7.6%. Discussed increasing Ozempic  to 2 mg for appetite and glucose management. Advised monitoring for bowel movement changes. - Increase Ozempic  to 2 mg for appetite and glucose control. - Monitor blood glucose and appetite. - Consider canceling 2 mg Ozempic  if glucose improves with better sleep and appetite control. "

## 2024-11-23 NOTE — Progress Notes (Signed)
 "  Established Patient Office Visit  Patient ID: Jillian Huynh, female    DOB: 05/05/1946  Age: 79 y.o. MRN: [id number] PCP: Alvan Dorothyann BIRCH, MD  Chief Complaint  Patient presents with   Hypertension   Diabetes    Subjective:     HPI  Discussed the use of AI scribe software for clinical note transcription with the patient, who gave verbal consent to proceed.  History of Present Illness Jillian Huynh is a 79 year old female with restless leg syndrome and type 2 diabetes who presents with sleep disturbances and increased blood sugar levels.  Sleep disturbances and restless leg syndrome - Significant sleep disruption attributed to restless leg syndrome, described as being 'up all night long' and 'walking the floor'. - Gabapentin  initially provided relief for symptoms for about 2 weeks. - Ropinirole  dosage increased from 2 mg to 4 mg on her own about a week ago to improve symptom control and sleep quality. - Restless leg syndrome causes more distress than other medical conditions, including Parkinson's disease.  Hyperglycemia and type 2 diabetes mellitus - Increased blood glucose levels noted, attributed to heightened hunger signals and stress related to sleep disturbances. - Recent hemoglobin A1c measured at 7.6%. - Received three boxes of Ozempic  1 mg. - Stress and sleep deprivation have contributed to elevated blood sugar levels.  Constipation - Constipation managed with over-the-counter stool softener taken four times daily and Miralax every other day. - No recent changes in bowel habits.     ROS    Objective:     BP 117/76   Pulse 73   Ht 5' 2 (1.575 m)   Wt 154 lb (69.9 kg)   SpO2 92%   BMI 28.17 kg/m    Physical Exam Vitals and nursing note reviewed.  Constitutional:      Appearance: Normal appearance.  HENT:     Head: Normocephalic and atraumatic.  Eyes:     Conjunctiva/sclera: Conjunctivae normal.  Cardiovascular:     Rate and Rhythm: Normal  rate and regular rhythm.  Pulmonary:     Effort: Pulmonary effort is normal.     Breath sounds: Normal breath sounds.  Skin:    General: Skin is warm and dry.  Neurological:     Mental Status: She is alert.  Psychiatric:        Mood and Affect: Mood normal.      Results for orders placed or performed in visit on 11/23/24  POCT HgB A1C  Result Value Ref Range   Hemoglobin A1C 7.6 (A) 4.0 - 5.6 %   HbA1c POC (<> result, manual entry)     HbA1c, POC (prediabetic range)     HbA1c, POC (controlled diabetic range)        The 10-year ASCVD risk score (Arnett DK, et al., 2019) is: 32.2%    Assessment & Plan:   Problem List Items Addressed This Visit       Cardiovascular and Mediastinum   Type 2 diabetes mellitus with circulatory disorder (HCC)    Type 2 diabetes mellitus with circulatory complications Recent increase in blood glucose likely due to appetite and stress from sleep disturbances. A1c is 7.6%. Discussed increasing Ozempic  to 2 mg for appetite and glucose management. Advised monitoring for bowel movement changes. - Increase Ozempic  to 2 mg for appetite and glucose control. - Monitor blood glucose and appetite. - Consider canceling 2 mg Ozempic  if glucose improves with better sleep and appetite control.      Relevant Orders  POCT HgB A1C (Completed)   CMP14+EGFR   Urine Microalbumin w/creat. ratio   Hypertensive heart disease with heart failure (HCC) - Primary   BP controlled today.       Relevant Orders   CMP14+EGFR   Urine Microalbumin w/creat. ratio     Nervous and Auditory   Parkinson disease (HCC)   Parkinson's disease Chronic condition with restless legs syndrome. Ropinirole  adjustment improved sleep, aiding overall Parkinson's management. - Continue gabapentin  and ropinirole .      Relevant Medications   rOPINIRole  (REQUIP ) 4 MG tablet     Other   Restless leg   Restless legs syndrome Chronic condition with recent exacerbation managed by  increasing ropinirole  to 4 mg, improving sleep quality. Discussed potential tolerance and dosage reduction after symptom control. - Continue ropinirole  4 mg as needed. - Consider reducing to 3 mg or 2 mg after symptom control. - Monitor for flare-ups and adjust dosage. - Consider gabapentin  adjustment if needed.       Assessment and Plan Assessment & Plan   General health maintenance Discussed flu vaccination importance.    Return in about 3 months (around 02/21/2025) for Diabetes follow-up.    Dorothyann Byars, MD [facility] at [facility]   "

## 2024-11-23 NOTE — Assessment & Plan Note (Signed)
 BP controlled today

## 2024-11-24 ENCOUNTER — Ambulatory Visit: Payer: Self-pay | Admitting: Family Medicine

## 2024-11-24 LAB — CMP14+EGFR
ALT: 10 IU/L (ref 0–32)
AST: 14 IU/L (ref 0–40)
Albumin: 4.6 g/dL (ref 3.8–4.8)
Alkaline Phosphatase: 82 IU/L (ref 49–135)
BUN/Creatinine Ratio: 37 — ABNORMAL HIGH (ref 12–28)
BUN: 26 mg/dL (ref 8–27)
Bilirubin Total: 0.6 mg/dL (ref 0.0–1.2)
CO2: 24 mmol/L (ref 20–29)
Calcium: 10.1 mg/dL (ref 8.7–10.3)
Chloride: 101 mmol/L (ref 96–106)
Creatinine, Ser: 0.71 mg/dL (ref 0.57–1.00)
Globulin, Total: 2 g/dL (ref 1.5–4.5)
Glucose: 148 mg/dL — ABNORMAL HIGH (ref 70–99)
Potassium: 4.4 mmol/L (ref 3.5–5.2)
Sodium: 139 mmol/L (ref 134–144)
Total Protein: 6.6 g/dL (ref 6.0–8.5)
eGFR: 87 mL/min/1.73

## 2024-11-24 LAB — MICROALBUMIN / CREATININE URINE RATIO
Creatinine, Urine: 88.3 mg/dL
Microalb/Creat Ratio: 4 mg/g{creat} (ref 0–29)
Microalbumin, Urine: 3.6 ug/mL

## 2024-11-24 NOTE — Progress Notes (Signed)
 Your lab work is within acceptable range and there are no concerning findings.   ?

## 2024-11-26 ENCOUNTER — Ambulatory Visit: Attending: Cardiovascular Disease

## 2024-11-26 DIAGNOSIS — I447 Left bundle-branch block, unspecified: Secondary | ICD-10-CM | POA: Diagnosis not present

## 2024-11-26 LAB — CUP PACEART REMOTE DEVICE CHECK
Date Time Interrogation Session: 20260115051600
Implantable Pulse Generator Implant Date: 20251014
Pulse Gen Serial Number: 162078

## 2024-11-27 ENCOUNTER — Ambulatory Visit: Payer: Self-pay | Admitting: Cardiovascular Disease

## 2024-12-03 ENCOUNTER — Other Ambulatory Visit: Payer: Self-pay | Admitting: *Deleted

## 2024-12-03 NOTE — Telephone Encounter (Signed)
 Copied from CRM #[id number]. Topic: Clinical - Prescription Issue >> Dec 03, 2024 11:01 AM Mercer PEDLAR wrote: Reason for CRM: patient is requesting to have prescription re-sent to pharmacy rOPINIRole  (REQUIP ) 4 MG tablet.  [facility] - [place], [place] - [address] [place], [place] [address] Phone: [phone]  Fax: [phone]

## 2024-12-03 NOTE — Progress Notes (Signed)
 Remote Loop Recorder Transmission

## 2024-12-17 ENCOUNTER — Other Ambulatory Visit: Payer: Self-pay | Admitting: Cardiovascular Disease

## 2024-12-27 ENCOUNTER — Ambulatory Visit

## 2024-12-28 ENCOUNTER — Other Ambulatory Visit

## 2025-01-27 ENCOUNTER — Ambulatory Visit

## 2025-01-27 ENCOUNTER — Ambulatory Visit: Admitting: Cardiology

## 2025-02-22 ENCOUNTER — Ambulatory Visit: Admitting: Family Medicine

## 2025-02-27 ENCOUNTER — Ambulatory Visit
